# Patient Record
Sex: Male | Born: 1955 | Race: White | Hispanic: No | Marital: Married | Smoking: Current some day smoker
Health system: Southern US, Community
[De-identification: ages and names within clinical notes are randomized; demographics above are authoritative.]

## PROBLEM LIST (undated history)

## (undated) DIAGNOSIS — E039 Hypothyroidism, unspecified: Secondary | ICD-10-CM

## (undated) DIAGNOSIS — N183 Chronic kidney disease, stage 3 unspecified: Secondary | ICD-10-CM

## (undated) DIAGNOSIS — I10 Essential (primary) hypertension: Secondary | ICD-10-CM

## (undated) DIAGNOSIS — J45909 Unspecified asthma, uncomplicated: Secondary | ICD-10-CM

## (undated) DIAGNOSIS — H269 Unspecified cataract: Secondary | ICD-10-CM

## (undated) DIAGNOSIS — E079 Disorder of thyroid, unspecified: Secondary | ICD-10-CM

## (undated) DIAGNOSIS — I509 Heart failure, unspecified: Secondary | ICD-10-CM

## (undated) DIAGNOSIS — M109 Gout, unspecified: Secondary | ICD-10-CM

## (undated) DIAGNOSIS — J449 Chronic obstructive pulmonary disease, unspecified: Secondary | ICD-10-CM

## (undated) DIAGNOSIS — G4730 Sleep apnea, unspecified: Secondary | ICD-10-CM

## (undated) DIAGNOSIS — R0902 Hypoxemia: Secondary | ICD-10-CM

## (undated) DIAGNOSIS — E119 Type 2 diabetes mellitus without complications: Secondary | ICD-10-CM

## (undated) HISTORY — DX: Heart failure, unspecified: I50.9

## (undated) HISTORY — DX: Type 2 diabetes mellitus without complications: E11.9

## (undated) HISTORY — DX: Hypoxemia: R09.02

## (undated) HISTORY — DX: Unspecified cataract: H26.9

## (undated) HISTORY — PX: EYE SURGERY: SHX253

## (undated) HISTORY — DX: Disorder of thyroid, unspecified: E07.9

## (undated) HISTORY — PX: CHOLECYSTECTOMY: SHX55

## (undated) HISTORY — PX: VENA CAVA FILTER PLACEMENT: SUR1032

## (undated) HISTORY — DX: Sleep apnea, unspecified: G47.30

## (undated) HISTORY — DX: Chronic obstructive pulmonary disease, unspecified: J44.9

---

## 2007-12-16 ENCOUNTER — Other Ambulatory Visit: Payer: Self-pay

## 2007-12-16 ENCOUNTER — Emergency Department: Payer: Self-pay | Admitting: Emergency Medicine

## 2008-02-12 ENCOUNTER — Inpatient Hospital Stay: Payer: Self-pay | Admitting: Internal Medicine

## 2009-07-10 ENCOUNTER — Emergency Department: Payer: Self-pay | Admitting: Internal Medicine

## 2010-12-21 ENCOUNTER — Emergency Department: Payer: Self-pay | Admitting: Emergency Medicine

## 2011-06-17 ENCOUNTER — Observation Stay: Payer: Self-pay | Admitting: Internal Medicine

## 2011-07-12 ENCOUNTER — Inpatient Hospital Stay: Payer: Self-pay | Admitting: Internal Medicine

## 2012-01-11 ENCOUNTER — Observation Stay: Payer: Self-pay | Admitting: Internal Medicine

## 2012-01-11 LAB — COMPREHENSIVE METABOLIC PANEL
Albumin: 3.3 g/dL — ABNORMAL LOW (ref 3.4–5.0)
Alkaline Phosphatase: 179 U/L — ABNORMAL HIGH (ref 50–136)
BUN: 19 mg/dL — ABNORMAL HIGH (ref 7–18)
Bilirubin,Total: 0.8 mg/dL (ref 0.2–1.0)
Calcium, Total: 8.6 mg/dL (ref 8.5–10.1)
Chloride: 98 mmol/L (ref 98–107)
Co2: 27 mmol/L (ref 21–32)
Creatinine: 1.41 mg/dL — ABNORMAL HIGH (ref 0.60–1.30)
EGFR (Non-African Amer.): 55 — ABNORMAL LOW
Glucose: 187 mg/dL — ABNORMAL HIGH (ref 65–99)
Potassium: 3.8 mmol/L (ref 3.5–5.1)
SGOT(AST): 22 U/L (ref 15–37)
SGPT (ALT): 15 U/L

## 2012-01-11 LAB — URINALYSIS, COMPLETE
Bacteria: NONE SEEN
Bilirubin,UR: NEGATIVE
Glucose,UR: NEGATIVE mg/dL (ref 0–75)
Ketone: NEGATIVE
Ph: 7 (ref 4.5–8.0)
Protein: NEGATIVE
Squamous Epithelial: <1

## 2012-01-11 LAB — CBC
HCT: 39.3 % — ABNORMAL LOW (ref 40.0–52.0)
MCH: 28.8 pg (ref 26.0–34.0)
MCHC: 32.1 g/dL (ref 32.0–36.0)
MCV: 90 fL (ref 80–100)
Platelet: 239 10*3/uL (ref 150–440)
RDW: 15.1 % — ABNORMAL HIGH (ref 11.5–14.5)
WBC: 9.5 10*3/uL (ref 3.8–10.6)

## 2012-01-11 LAB — CK TOTAL AND CKMB (NOT AT ARMC)
CK, Total: 67 U/L (ref 35–232)
CK, Total: 74 U/L (ref 35–232)
CK-MB: 2.7 ng/mL (ref 0.5–3.6)
CK-MB: 2.4 ng/mL (ref 0.5–3.6)

## 2012-01-11 LAB — TROPONIN I: Troponin-I: <0.02 ng/mL

## 2012-01-11 LAB — PROTIME-INR
INR: 1.4
Prothrombin Time: 17.1 secs — ABNORMAL HIGH (ref 11.5–14.7)

## 2012-01-12 LAB — BASIC METABOLIC PANEL
Anion Gap: 8 (ref 7–16)
Chloride: 98 mmol/L (ref 98–107)
Co2: 29 mmol/L (ref 21–32)
Creatinine: 1.41 mg/dL — ABNORMAL HIGH (ref 0.60–1.30)
EGFR (African American): >60
EGFR (Non-African Amer.): 55 — ABNORMAL LOW
Glucose: 136 mg/dL — ABNORMAL HIGH (ref 65–99)
Osmolality: 274 (ref 275–301)
Potassium: 3.7 mmol/L (ref 3.5–5.1)

## 2012-01-12 LAB — PROTIME-INR: INR: 1.6

## 2012-01-28 ENCOUNTER — Emergency Department: Payer: Self-pay | Admitting: Emergency Medicine

## 2012-01-28 LAB — COMPREHENSIVE METABOLIC PANEL
Anion Gap: 10 (ref 7–16)
BUN: 34 mg/dL — ABNORMAL HIGH (ref 7–18)
Bilirubin,Total: 1.3 mg/dL — ABNORMAL HIGH (ref 0.2–1.0)
Calcium, Total: 8.3 mg/dL — ABNORMAL LOW (ref 8.5–10.1)
Chloride: 93 mmol/L — ABNORMAL LOW (ref 98–107)
Co2: 29 mmol/L (ref 21–32)
Creatinine: 1.74 mg/dL — ABNORMAL HIGH (ref 0.60–1.30)
EGFR (African American): 50 — ABNORMAL LOW
Osmolality: 280 (ref 275–301)
Potassium: 4.2 mmol/L (ref 3.5–5.1)
Sodium: 132 mmol/L — ABNORMAL LOW (ref 136–145)
Total Protein: 7 g/dL (ref 6.4–8.2)

## 2012-01-28 LAB — PROTIME-INR: Prothrombin Time: 27.3 secs — ABNORMAL HIGH (ref 11.5–14.7)

## 2012-01-28 LAB — CBC
HCT: 34.9 % — ABNORMAL LOW (ref 40.0–52.0)
HGB: 11.6 g/dL — ABNORMAL LOW (ref 13.0–18.0)
Platelet: 218 10*3/uL (ref 150–440)
RBC: 3.99 10*6/uL — ABNORMAL LOW (ref 4.40–5.90)
RDW: 15.6 % — ABNORMAL HIGH (ref 11.5–14.5)
WBC: 12.3 10*3/uL — ABNORMAL HIGH (ref 3.8–10.6)

## 2012-01-28 LAB — TROPONIN I: Troponin-I: <0.02 ng/mL

## 2012-01-28 LAB — PRO B NATRIURETIC PEPTIDE: B-Type Natriuretic Peptide: 1720 pg/mL — ABNORMAL HIGH (ref 0–125)

## 2012-03-03 ENCOUNTER — Emergency Department: Payer: Self-pay | Admitting: Emergency Medicine

## 2012-03-03 LAB — COMPREHENSIVE METABOLIC PANEL
Albumin: 3.3 g/dL — ABNORMAL LOW (ref 3.4–5.0)
Alkaline Phosphatase: 206 U/L — ABNORMAL HIGH (ref 50–136)
BUN: 26 mg/dL — ABNORMAL HIGH (ref 7–18)
Bilirubin,Total: 0.8 mg/dL (ref 0.2–1.0)
Calcium, Total: 9.5 mg/dL (ref 8.5–10.1)
Chloride: 98 mmol/L (ref 98–107)
Co2: 29 mmol/L (ref 21–32)
EGFR (African American): >60
EGFR (Non-African Amer.): 58 — ABNORMAL LOW
Glucose: 174 mg/dL — ABNORMAL HIGH (ref 65–99)
Osmolality: 275 (ref 275–301)
SGOT(AST): 25 U/L (ref 15–37)
Sodium: 133 mmol/L — ABNORMAL LOW (ref 136–145)

## 2012-03-03 LAB — TROPONIN I: Troponin-I: 0.03 ng/mL

## 2012-03-03 LAB — CBC
HCT: 36.7 % — ABNORMAL LOW (ref 40.0–52.0)
HGB: 11.9 g/dL — ABNORMAL LOW (ref 13.0–18.0)
MCH: 28.1 pg (ref 26.0–34.0)
MCV: 87 fL (ref 80–100)
RBC: 4.23 10*6/uL — ABNORMAL LOW (ref 4.40–5.90)
RDW: 16.7 % — ABNORMAL HIGH (ref 11.5–14.5)
WBC: 8.6 10*3/uL (ref 3.8–10.6)

## 2012-03-03 LAB — CK TOTAL AND CKMB (NOT AT ARMC)
CK, Total: 52 U/L (ref 35–232)
CK-MB: 2 ng/mL (ref 0.5–3.6)

## 2012-03-08 ENCOUNTER — Emergency Department: Payer: Self-pay | Admitting: Emergency Medicine

## 2012-03-08 LAB — URINALYSIS, COMPLETE
Blood: NEGATIVE
Glucose,UR: NEGATIVE mg/dL (ref 0–75)
Hyaline Cast: 15
Ketone: NEGATIVE
Leukocyte Esterase: NEGATIVE
Nitrite: NEGATIVE
Ph: 6 (ref 4.5–8.0)
Protein: 100
RBC,UR: 1 /HPF (ref 0–5)
Specific Gravity: 1.012 (ref 1.003–1.030)
Squamous Epithelial: NONE SEEN
WBC UR: <1 /HPF (ref 0–5)

## 2012-03-08 LAB — BASIC METABOLIC PANEL
BUN: 28 mg/dL — ABNORMAL HIGH (ref 7–18)
Calcium, Total: 7.7 mg/dL — ABNORMAL LOW (ref 8.5–10.1)
Creatinine: 1.5 mg/dL — ABNORMAL HIGH (ref 0.60–1.30)
Glucose: 236 mg/dL — ABNORMAL HIGH (ref 65–99)
Sodium: 133 mmol/L — ABNORMAL LOW (ref 136–145)

## 2012-03-08 LAB — CBC
HCT: 37.1 % — ABNORMAL LOW (ref 40.0–52.0)
HGB: 12 g/dL — ABNORMAL LOW (ref 13.0–18.0)
MCHC: 32.3 g/dL (ref 32.0–36.0)
MCV: 87 fL (ref 80–100)
Platelet: 203 10*3/uL (ref 150–440)
RDW: 17.1 % — ABNORMAL HIGH (ref 11.5–14.5)
WBC: 7.7 10*3/uL (ref 3.8–10.6)

## 2012-03-08 LAB — CK TOTAL AND CKMB (NOT AT ARMC)
CK, Total: 54 U/L (ref 35–232)
CK-MB: 1.4 ng/mL (ref 0.5–3.6)

## 2012-03-08 LAB — PROTIME-INR: Prothrombin Time: 19.9 secs — ABNORMAL HIGH (ref 11.5–14.7)

## 2012-05-03 ENCOUNTER — Encounter: Payer: Self-pay | Admitting: Geriatric Medicine

## 2012-06-11 ENCOUNTER — Observation Stay: Payer: Self-pay | Admitting: Internal Medicine

## 2012-06-11 LAB — CBC
HCT: 40.6 % (ref 40.0–52.0)
HGB: 13.4 g/dL (ref 13.0–18.0)
MCV: 86 fL (ref 80–100)
Platelet: 224 10*3/uL (ref 150–440)
RBC: 4.7 10*6/uL (ref 4.40–5.90)
RDW: 16.1 % — ABNORMAL HIGH (ref 11.5–14.5)
WBC: 7.8 10*3/uL (ref 3.8–10.6)

## 2012-06-11 LAB — URINALYSIS, COMPLETE
Bacteria: NONE SEEN
Bilirubin,UR: NEGATIVE
Glucose,UR: 150 mg/dL (ref 0–75)
Nitrite: NEGATIVE
Protein: NEGATIVE
RBC,UR: 442 /HPF (ref 0–5)
Specific Gravity: 1.006 (ref 1.003–1.030)
Squamous Epithelial: <1

## 2012-06-11 LAB — COMPREHENSIVE METABOLIC PANEL
Albumin: 3.4 g/dL (ref 3.4–5.0)
Alkaline Phosphatase: 231 U/L — ABNORMAL HIGH (ref 50–136)
Bilirubin,Total: 1 mg/dL (ref 0.2–1.0)
Chloride: 99 mmol/L (ref 98–107)
Creatinine: 1.63 mg/dL — ABNORMAL HIGH (ref 0.60–1.30)
EGFR (African American): 54 — ABNORMAL LOW
Glucose: 303 mg/dL — ABNORMAL HIGH (ref 65–99)
SGOT(AST): 20 U/L (ref 15–37)
SGPT (ALT): 17 U/L (ref 12–78)
Sodium: 136 mmol/L (ref 136–145)
Total Protein: 7.7 g/dL (ref 6.4–8.2)

## 2012-06-11 LAB — PRO B NATRIURETIC PEPTIDE: B-Type Natriuretic Peptide: 746 pg/mL — ABNORMAL HIGH (ref 0–125)

## 2012-06-12 LAB — CBC WITH DIFFERENTIAL/PLATELET
Eosinophil %: 2 %
HCT: 39.2 % — ABNORMAL LOW (ref 40.0–52.0)
HGB: 12.9 g/dL — ABNORMAL LOW (ref 13.0–18.0)
Lymphocyte #: 1.8 10*3/uL (ref 1.0–3.6)
Lymphocyte %: 21.4 %
MCH: 28.5 pg (ref 26.0–34.0)
MCV: 86 fL (ref 80–100)
Monocyte %: 7.3 %
Neutrophil #: 5.7 10*3/uL (ref 1.4–6.5)
Platelet: 219 10*3/uL (ref 150–440)
RDW: 16.4 % — ABNORMAL HIGH (ref 11.5–14.5)

## 2012-06-12 LAB — HEMOGLOBIN A1C: Hemoglobin A1C: 8.8 % — ABNORMAL HIGH (ref 4.2–6.3)

## 2012-06-12 LAB — PROTIME-INR: INR: 1.8

## 2012-06-13 LAB — CBC WITH DIFFERENTIAL/PLATELET
Basophil #: 0.1 10*3/uL (ref 0.0–0.1)
Basophil %: 1 %
Eosinophil %: 1.9 %
HCT: 40.8 % (ref 40.0–52.0)
HGB: 14 g/dL (ref 13.0–18.0)
Lymphocyte #: 1.8 10*3/uL (ref 1.0–3.6)
MCH: 29.4 pg (ref 26.0–34.0)
MCV: 86 fL (ref 80–100)
Monocyte #: 0.7 x10 3/mm (ref 0.2–1.0)
Monocyte %: 7 %
Neutrophil #: 6.8 10*3/uL — ABNORMAL HIGH (ref 1.4–6.5)
Platelet: 237 10*3/uL (ref 150–440)
RDW: 16.9 % — ABNORMAL HIGH (ref 11.5–14.5)
WBC: 9.6 10*3/uL (ref 3.8–10.6)

## 2012-06-13 LAB — BASIC METABOLIC PANEL
BUN: 25 mg/dL — ABNORMAL HIGH (ref 7–18)
Calcium, Total: 8.9 mg/dL (ref 8.5–10.1)
EGFR (African American): >60
EGFR (Non-African Amer.): 53 — ABNORMAL LOW
Glucose: 156 mg/dL — ABNORMAL HIGH (ref 65–99)
Osmolality: 276 (ref 275–301)
Potassium: 3.9 mmol/L (ref 3.5–5.1)
Sodium: 134 mmol/L — ABNORMAL LOW (ref 136–145)

## 2012-06-13 LAB — PROTIME-INR: INR: 1.8

## 2012-06-13 LAB — URINALYSIS, COMPLETE
Bacteria: NONE SEEN
Nitrite: NEGATIVE
Protein: 100
RBC,UR: 513 /HPF (ref 0–5)
Specific Gravity: 1.014 (ref 1.003–1.030)
Squamous Epithelial: NONE SEEN
WBC UR: 18 /HPF (ref 0–5)

## 2012-06-13 LAB — URINE CULTURE

## 2012-06-14 LAB — PROTIME-INR
INR: 2
Prothrombin Time: 22.8 secs — ABNORMAL HIGH (ref 11.5–14.7)

## 2012-06-14 LAB — CBC WITH DIFFERENTIAL/PLATELET
Basophil #: 0.1 10*3/uL (ref 0.0–0.1)
Eosinophil #: 0.2 10*3/uL (ref 0.0–0.7)
HGB: 13.8 g/dL (ref 13.0–18.0)
Lymphocyte #: 2.1 10*3/uL (ref 1.0–3.6)
Lymphocyte %: 23 %
MCHC: 33.5 g/dL (ref 32.0–36.0)
Neutrophil %: 64.9 %
RDW: 16 % — ABNORMAL HIGH (ref 11.5–14.5)

## 2012-07-04 ENCOUNTER — Ambulatory Visit: Payer: Self-pay | Admitting: Urology

## 2012-10-02 LAB — URINALYSIS, COMPLETE
Bacteria: NONE SEEN
Bilirubin,UR: NEGATIVE
Hyaline Cast: 9
Ketone: NEGATIVE
Nitrite: NEGATIVE
Protein: NEGATIVE
WBC UR: <1 /HPF (ref 0–5)

## 2012-10-02 LAB — CK TOTAL AND CKMB (NOT AT ARMC)
CK, Total: 57 U/L (ref 35–232)
CK-MB: 1.5 ng/mL (ref 0.5–3.6)

## 2012-10-02 LAB — CBC
HCT: 38.7 % — ABNORMAL LOW (ref 40.0–52.0)
HGB: 12.5 g/dL — ABNORMAL LOW (ref 13.0–18.0)
Platelet: 267 10*3/uL (ref 150–440)
RBC: 4.37 10*6/uL — ABNORMAL LOW (ref 4.40–5.90)
RDW: 15.2 % — ABNORMAL HIGH (ref 11.5–14.5)
WBC: 10.1 10*3/uL (ref 3.8–10.6)

## 2012-10-02 LAB — COMPREHENSIVE METABOLIC PANEL
Alkaline Phosphatase: 185 U/L — ABNORMAL HIGH (ref 50–136)
Calcium, Total: 8.7 mg/dL (ref 8.5–10.1)
Co2: 27 mmol/L (ref 21–32)
Creatinine: 1.98 mg/dL — ABNORMAL HIGH (ref 0.60–1.30)
EGFR (Non-African Amer.): 36 — ABNORMAL LOW
Glucose: 134 mg/dL — ABNORMAL HIGH (ref 65–99)
Osmolality: 274 (ref 275–301)
Sodium: 132 mmol/L — ABNORMAL LOW (ref 136–145)

## 2012-10-02 LAB — PROTIME-INR: INR: 1.7

## 2012-10-02 LAB — TROPONIN I: Troponin-I: <0.02 ng/mL

## 2012-10-03 ENCOUNTER — Inpatient Hospital Stay: Payer: Self-pay | Admitting: Internal Medicine

## 2012-10-03 LAB — TROPONIN I
Troponin-I: <0.02 ng/mL
Troponin-I: <0.02 ng/mL

## 2012-10-03 LAB — PROTIME-INR: INR: 1.7

## 2012-10-04 LAB — CBC WITH DIFFERENTIAL/PLATELET
Basophil #: 0.1 10*3/uL (ref 0.0–0.1)
Basophil %: 0.7 %
HGB: 13 g/dL (ref 13.0–18.0)
Lymphocyte #: 1.7 10*3/uL (ref 1.0–3.6)
MCHC: 34 g/dL (ref 32.0–36.0)
MCV: 89 fL (ref 80–100)
Monocyte #: 0.8 x10 3/mm (ref 0.2–1.0)
Neutrophil #: 6.5 10*3/uL (ref 1.4–6.5)
Platelet: 230 10*3/uL (ref 150–440)
RBC: 4.3 10*6/uL — ABNORMAL LOW (ref 4.40–5.90)
RDW: 15.1 % — ABNORMAL HIGH (ref 11.5–14.5)

## 2012-10-04 LAB — BASIC METABOLIC PANEL
BUN: 35 mg/dL — ABNORMAL HIGH (ref 7–18)
Calcium, Total: 9.1 mg/dL (ref 8.5–10.1)
Chloride: 99 mmol/L (ref 98–107)
Creatinine: 1.68 mg/dL — ABNORMAL HIGH (ref 0.60–1.30)
Potassium: 3.7 mmol/L (ref 3.5–5.1)
Sodium: 135 mmol/L — ABNORMAL LOW (ref 136–145)

## 2012-10-04 LAB — PROTIME-INR
INR: 1.8
Prothrombin Time: 20.8 secs — ABNORMAL HIGH (ref 11.5–14.7)

## 2013-02-14 ENCOUNTER — Inpatient Hospital Stay: Payer: Self-pay | Admitting: Specialist

## 2013-02-14 LAB — CBC
HCT: 40.3 % (ref 40.0–52.0)
HGB: 14 g/dL (ref 13.0–18.0)
MCH: 30.1 pg (ref 26.0–34.0)
MCHC: 34.7 g/dL (ref 32.0–36.0)
Platelet: 218 10*3/uL (ref 150–440)
RBC: 4.65 10*6/uL (ref 4.40–5.90)
RDW: 15.3 % — ABNORMAL HIGH (ref 11.5–14.5)
WBC: 8.9 10*3/uL (ref 3.8–10.6)

## 2013-02-14 LAB — COMPREHENSIVE METABOLIC PANEL
Albumin: 3.1 g/dL — ABNORMAL LOW (ref 3.4–5.0)
Alkaline Phosphatase: 180 U/L — ABNORMAL HIGH (ref 50–136)
Bilirubin,Total: 0.5 mg/dL (ref 0.2–1.0)
Calcium, Total: 8.9 mg/dL (ref 8.5–10.1)
Co2: 30 mmol/L (ref 21–32)
Creatinine: 1.9 mg/dL — ABNORMAL HIGH (ref 0.60–1.30)
EGFR (African American): 44 — ABNORMAL LOW
EGFR (Non-African Amer.): 38 — ABNORMAL LOW
Glucose: 141 mg/dL — ABNORMAL HIGH (ref 65–99)
Potassium: 3.5 mmol/L (ref 3.5–5.1)
SGPT (ALT): 14 U/L (ref 12–78)
Sodium: 134 mmol/L — ABNORMAL LOW (ref 136–145)

## 2013-02-14 LAB — PROTIME-INR
INR: 1.9
Prothrombin Time: 21.1 secs — ABNORMAL HIGH (ref 11.5–14.7)

## 2013-02-14 LAB — CK TOTAL AND CKMB (NOT AT ARMC)
CK, Total: 65 U/L (ref 35–232)
CK-MB: 1.7 ng/mL (ref 0.5–3.6)

## 2013-02-14 LAB — APTT: Activated PTT: 37.2 secs — ABNORMAL HIGH (ref 23.6–35.9)

## 2013-02-15 LAB — CBC WITH DIFFERENTIAL/PLATELET
Basophil #: 0 10*3/uL (ref 0.0–0.1)
Basophil %: 0.7 %
Eosinophil #: 0.1 10*3/uL (ref 0.0–0.7)
MCHC: 34.9 g/dL (ref 32.0–36.0)
MCV: 87 fL (ref 80–100)
Monocyte #: 0.8 x10 3/mm (ref 0.2–1.0)
Neutrophil %: 63.4 %
Platelet: 186 10*3/uL (ref 150–440)
RBC: 4.41 10*6/uL (ref 4.40–5.90)
RDW: 15.5 % — ABNORMAL HIGH (ref 11.5–14.5)

## 2013-02-15 LAB — URINALYSIS, COMPLETE
Bacteria: NONE SEEN
Bilirubin,UR: NEGATIVE
Blood: NEGATIVE
Glucose,UR: >=500 mg/dL (ref 0–75)
Leukocyte Esterase: NEGATIVE
Nitrite: NEGATIVE
Protein: NEGATIVE
Squamous Epithelial: NONE SEEN
WBC UR: <1 /HPF (ref 0–5)

## 2013-02-15 LAB — HEMOGLOBIN: HGB: 13.5 g/dL (ref 13.0–18.0)

## 2013-02-15 LAB — BASIC METABOLIC PANEL
Anion Gap: 6 — ABNORMAL LOW (ref 7–16)
Calcium, Total: 8.6 mg/dL (ref 8.5–10.1)
Chloride: 100 mmol/L (ref 98–107)
Co2: 29 mmol/L (ref 21–32)
EGFR (African American): 54 — ABNORMAL LOW
Glucose: 66 mg/dL (ref 65–99)
Osmolality: 274 (ref 275–301)

## 2013-02-15 LAB — PROTEIN / CREATININE RATIO, URINE
Creatinine, Urine: 100.9 mg/dL (ref 30.0–125.0)
Protein/Creat. Ratio: 188 mg/gCREAT (ref 0–200)

## 2013-02-16 LAB — BASIC METABOLIC PANEL
Anion Gap: 7 (ref 7–16)
BUN: 27 mg/dL — ABNORMAL HIGH (ref 7–18)
Creatinine: 1.5 mg/dL — ABNORMAL HIGH (ref 0.60–1.30)
EGFR (Non-African Amer.): 51 — ABNORMAL LOW
Glucose: 232 mg/dL — ABNORMAL HIGH (ref 65–99)
Osmolality: 275 (ref 275–301)
Potassium: 4.2 mmol/L (ref 3.5–5.1)
Sodium: 131 mmol/L — ABNORMAL LOW (ref 136–145)

## 2013-02-16 LAB — CBC WITH DIFFERENTIAL/PLATELET
Basophil #: 0 10*3/uL (ref 0.0–0.1)
Basophil %: 0.1 %
Eosinophil #: 0 10*3/uL (ref 0.0–0.7)
HCT: 36.7 % — ABNORMAL LOW (ref 40.0–52.0)
Lymphocyte #: 0.8 10*3/uL — ABNORMAL LOW (ref 1.0–3.6)
MCHC: 35.2 g/dL (ref 32.0–36.0)
Neutrophil #: 6.7 10*3/uL — ABNORMAL HIGH (ref 1.4–6.5)
Neutrophil %: 87.5 %
Platelet: 183 10*3/uL (ref 150–440)
RBC: 4.27 10*6/uL — ABNORMAL LOW (ref 4.40–5.90)

## 2013-10-02 ENCOUNTER — Emergency Department: Payer: Self-pay | Admitting: Emergency Medicine

## 2013-10-02 LAB — CBC
HCT: 45.1 % (ref 40.0–52.0)
HGB: 15.2 g/dL (ref 13.0–18.0)
MCH: 29.7 pg (ref 26.0–34.0)
MCHC: 33.8 g/dL (ref 32.0–36.0)
MCV: 88 fL (ref 80–100)
PLATELETS: 230 10*3/uL (ref 150–440)
RBC: 5.14 10*6/uL (ref 4.40–5.90)
RDW: 14.3 % (ref 11.5–14.5)
WBC: 9.6 10*3/uL (ref 3.8–10.6)

## 2013-10-02 LAB — COMPREHENSIVE METABOLIC PANEL
ALT: 11 U/L — AB (ref 12–78)
AST: 40 U/L — AB (ref 15–37)
Albumin: 3.3 g/dL — ABNORMAL LOW (ref 3.4–5.0)
Alkaline Phosphatase: 163 U/L — ABNORMAL HIGH
Anion Gap: 4 — ABNORMAL LOW (ref 7–16)
BILIRUBIN TOTAL: 1.1 mg/dL — AB (ref 0.2–1.0)
BUN: 28 mg/dL — AB (ref 7–18)
Calcium, Total: 9.1 mg/dL (ref 8.5–10.1)
Chloride: 93 mmol/L — ABNORMAL LOW (ref 98–107)
Co2: 32 mmol/L (ref 21–32)
Creatinine: 2.14 mg/dL — ABNORMAL HIGH (ref 0.60–1.30)
EGFR (African American): 38 — ABNORMAL LOW
EGFR (Non-African Amer.): 33 — ABNORMAL LOW
GLUCOSE: 181 mg/dL — AB (ref 65–99)
Osmolality: 269 (ref 275–301)
POTASSIUM: 3.8 mmol/L (ref 3.5–5.1)
Sodium: 129 mmol/L — ABNORMAL LOW (ref 136–145)
TOTAL PROTEIN: 7.8 g/dL (ref 6.4–8.2)

## 2013-10-03 LAB — URINALYSIS, COMPLETE
Bacteria: NONE SEEN
Bilirubin,UR: NEGATIVE
Blood: NEGATIVE
Glucose,UR: NEGATIVE mg/dL (ref 0–75)
Ketone: NEGATIVE
Leukocyte Esterase: NEGATIVE
Nitrite: NEGATIVE
Ph: 8 (ref 4.5–8.0)
Protein: NEGATIVE
SPECIFIC GRAVITY: 1.008 (ref 1.003–1.030)
Squamous Epithelial: NONE SEEN
WBC UR: NONE SEEN /HPF (ref 0–5)

## 2013-10-30 ENCOUNTER — Inpatient Hospital Stay: Payer: Self-pay | Admitting: Internal Medicine

## 2013-10-30 LAB — CBC WITH DIFFERENTIAL/PLATELET
BASOS ABS: 0 10*3/uL (ref 0.0–0.1)
Basophil %: 0.3 %
Eosinophil #: 0.1 10*3/uL (ref 0.0–0.7)
Eosinophil %: 0.7 %
HCT: 41.1 % (ref 40.0–52.0)
HGB: 14.1 g/dL (ref 13.0–18.0)
Lymphocyte #: 1.8 10*3/uL (ref 1.0–3.6)
Lymphocyte %: 12.7 %
MCH: 30.5 pg (ref 26.0–34.0)
MCHC: 34.3 g/dL (ref 32.0–36.0)
MCV: 89 fL (ref 80–100)
MONO ABS: 0.8 x10 3/mm (ref 0.2–1.0)
MONOS PCT: 6.1 %
NEUTROS ABS: 11.1 10*3/uL — AB (ref 1.4–6.5)
NEUTROS PCT: 80.2 %
PLATELETS: 235 10*3/uL (ref 150–440)
RBC: 4.62 10*6/uL (ref 4.40–5.90)
RDW: 14.2 % (ref 11.5–14.5)
WBC: 13.8 10*3/uL — ABNORMAL HIGH (ref 3.8–10.6)

## 2013-10-30 LAB — URINALYSIS, COMPLETE
BILIRUBIN, UR: NEGATIVE
BLOOD: NEGATIVE
Bacteria: NONE SEEN
GLUCOSE, UR: NEGATIVE mg/dL (ref 0–75)
Hyaline Cast: 31
Ketone: NEGATIVE
LEUKOCYTE ESTERASE: NEGATIVE
Nitrite: NEGATIVE
PH: 8 (ref 4.5–8.0)
PROTEIN: NEGATIVE
RBC,UR: 2 /HPF (ref 0–5)
Specific Gravity: 1.006 (ref 1.003–1.030)
Squamous Epithelial: NONE SEEN
WBC UR: NONE SEEN /HPF (ref 0–5)

## 2013-10-30 LAB — COMPREHENSIVE METABOLIC PANEL
ALBUMIN: 3 g/dL — AB (ref 3.4–5.0)
ANION GAP: 5 — AB (ref 7–16)
Alkaline Phosphatase: 159 U/L — ABNORMAL HIGH
BUN: 22 mg/dL — ABNORMAL HIGH (ref 7–18)
Bilirubin,Total: 1.5 mg/dL — ABNORMAL HIGH (ref 0.2–1.0)
CHLORIDE: 98 mmol/L (ref 98–107)
Calcium, Total: 8.7 mg/dL (ref 8.5–10.1)
Co2: 27 mmol/L (ref 21–32)
Creatinine: 1.86 mg/dL — ABNORMAL HIGH (ref 0.60–1.30)
GFR CALC AF AMER: 45 — AB
GFR CALC NON AF AMER: 39 — AB
Glucose: 120 mg/dL — ABNORMAL HIGH (ref 65–99)
OSMOLALITY: 265 (ref 275–301)
Potassium: 3.5 mmol/L (ref 3.5–5.1)
SGOT(AST): 18 U/L (ref 15–37)
SGPT (ALT): 12 U/L (ref 12–78)
Sodium: 130 mmol/L — ABNORMAL LOW (ref 136–145)
TOTAL PROTEIN: 7.6 g/dL (ref 6.4–8.2)

## 2013-10-30 LAB — PROTIME-INR
INR: 1.1
Prothrombin Time: 14.4 secs (ref 11.5–14.7)

## 2013-10-30 LAB — CK TOTAL AND CKMB (NOT AT ARMC)
CK, TOTAL: 29 U/L — AB
CK, Total: 35 U/L — ABNORMAL LOW
CK, Total: 26 U/L — ABNORMAL LOW
CK-MB: <0.5 ng/mL — ABNORMAL LOW (ref 0.5–3.6)
CK-MB: <0.5 ng/mL — ABNORMAL LOW (ref 0.5–3.6)
CK-MB: <0.5 ng/mL — ABNORMAL LOW (ref 0.5–3.6)

## 2013-10-30 LAB — TROPONIN I

## 2013-10-31 LAB — CBC WITH DIFFERENTIAL/PLATELET
BASOS ABS: 0 10*3/uL (ref 0.0–0.1)
BASOS PCT: 0.5 %
Eosinophil #: 0.1 10*3/uL (ref 0.0–0.7)
Eosinophil %: 1 %
HCT: 38.2 % — ABNORMAL LOW (ref 40.0–52.0)
HGB: 13.4 g/dL (ref 13.0–18.0)
LYMPHS PCT: 17.1 %
Lymphocyte #: 1.5 10*3/uL (ref 1.0–3.6)
MCH: 31.1 pg (ref 26.0–34.0)
MCHC: 35.1 g/dL (ref 32.0–36.0)
MCV: 89 fL (ref 80–100)
MONOS PCT: 6.5 %
Monocyte #: 0.6 x10 3/mm (ref 0.2–1.0)
NEUTROS ABS: 6.5 10*3/uL (ref 1.4–6.5)
Neutrophil %: 74.9 %
Platelet: 190 10*3/uL (ref 150–440)
RBC: 4.31 10*6/uL — ABNORMAL LOW (ref 4.40–5.90)
RDW: 14 % (ref 11.5–14.5)
WBC: 8.7 10*3/uL (ref 3.8–10.6)

## 2013-10-31 LAB — BASIC METABOLIC PANEL
Anion Gap: 4 — ABNORMAL LOW (ref 7–16)
BUN: 20 mg/dL — ABNORMAL HIGH (ref 7–18)
CREATININE: 1.53 mg/dL — AB (ref 0.60–1.30)
Calcium, Total: 8.3 mg/dL — ABNORMAL LOW (ref 8.5–10.1)
Chloride: 101 mmol/L (ref 98–107)
Co2: 28 mmol/L (ref 21–32)
EGFR (Non-African Amer.): 49 — ABNORMAL LOW
GFR CALC AF AMER: 57 — AB
Glucose: 116 mg/dL — ABNORMAL HIGH (ref 65–99)
OSMOLALITY: 270 (ref 275–301)
Potassium: 3.9 mmol/L (ref 3.5–5.1)
Sodium: 133 mmol/L — ABNORMAL LOW (ref 136–145)

## 2013-11-04 LAB — CULTURE, BLOOD (SINGLE)

## 2014-05-09 ENCOUNTER — Emergency Department: Payer: Self-pay | Admitting: Internal Medicine

## 2014-05-09 LAB — LIPASE, BLOOD: Lipase: 122 U/L (ref 73–393)

## 2014-05-09 LAB — COMPREHENSIVE METABOLIC PANEL
ALBUMIN: 3 g/dL — AB (ref 3.4–5.0)
AST: 24 U/L (ref 15–37)
Alkaline Phosphatase: 325 U/L — ABNORMAL HIGH
Anion Gap: 13 (ref 7–16)
BILIRUBIN TOTAL: 0.6 mg/dL (ref 0.2–1.0)
BUN: 31 mg/dL — AB (ref 7–18)
CHLORIDE: 95 mmol/L — AB (ref 98–107)
Calcium, Total: 8.9 mg/dL (ref 8.5–10.1)
Co2: 27 mmol/L (ref 21–32)
Creatinine: 1.72 mg/dL — ABNORMAL HIGH (ref 0.60–1.30)
EGFR (African American): 50 — ABNORMAL LOW
GFR CALC NON AF AMER: 43 — AB
Glucose: 257 mg/dL — ABNORMAL HIGH (ref 65–99)
Osmolality: 285 (ref 275–301)
Potassium: 3.3 mmol/L — ABNORMAL LOW (ref 3.5–5.1)
SGPT (ALT): 17 U/L
Sodium: 135 mmol/L — ABNORMAL LOW (ref 136–145)
Total Protein: 7.2 g/dL (ref 6.4–8.2)

## 2014-05-09 LAB — CBC
HCT: 42.9 % (ref 40.0–52.0)
HGB: 13.9 g/dL (ref 13.0–18.0)
MCH: 29.2 pg (ref 26.0–34.0)
MCHC: 32.5 g/dL (ref 32.0–36.0)
MCV: 90 fL (ref 80–100)
Platelet: 213 10*3/uL (ref 150–440)
RBC: 4.76 10*6/uL (ref 4.40–5.90)
RDW: 14.6 % — ABNORMAL HIGH (ref 11.5–14.5)
WBC: 8.9 10*3/uL (ref 3.8–10.6)

## 2014-05-09 LAB — URINALYSIS, COMPLETE
BACTERIA: NONE SEEN
BILIRUBIN, UR: NEGATIVE
Blood: NEGATIVE
Glucose,UR: NEGATIVE mg/dL (ref 0–75)
Hyaline Cast: 11
KETONE: NEGATIVE
LEUKOCYTE ESTERASE: NEGATIVE
NITRITE: NEGATIVE
Ph: 6 (ref 4.5–8.0)
Protein: 30
RBC,UR: 1 /HPF (ref 0–5)
Specific Gravity: 1.006 (ref 1.003–1.030)
WBC UR: <1 /HPF (ref 0–5)

## 2014-05-09 LAB — TROPONIN I: TROPONIN-I: 0.03 ng/mL

## 2014-05-29 LAB — CBC WITH DIFFERENTIAL/PLATELET
BASOS PCT: 1.1 %
Basophil #: 0.1 10*3/uL (ref 0.0–0.1)
EOS PCT: 2.7 %
Eosinophil #: 0.2 10*3/uL (ref 0.0–0.7)
HCT: 42.5 % (ref 40.0–52.0)
HGB: 13.4 g/dL (ref 13.0–18.0)
Lymphocyte #: 1.5 10*3/uL (ref 1.0–3.6)
Lymphocyte %: 19.8 %
MCH: 28.8 pg (ref 26.0–34.0)
MCHC: 31.6 g/dL — ABNORMAL LOW (ref 32.0–36.0)
MCV: 91 fL (ref 80–100)
MONO ABS: 0.6 x10 3/mm (ref 0.2–1.0)
Monocyte %: 7.5 %
NEUTROS ABS: 5.2 10*3/uL (ref 1.4–6.5)
NEUTROS PCT: 68.9 %
PLATELETS: 192 10*3/uL (ref 150–440)
RBC: 4.65 10*6/uL (ref 4.40–5.90)
RDW: 14.8 % — AB (ref 11.5–14.5)
WBC: 7.5 10*3/uL (ref 3.8–10.6)

## 2014-05-29 LAB — COMPREHENSIVE METABOLIC PANEL
ALT: 12 U/L — AB
AST: 22 U/L (ref 15–37)
Albumin: 3.2 g/dL — ABNORMAL LOW (ref 3.4–5.0)
Alkaline Phosphatase: 326 U/L — ABNORMAL HIGH
Anion Gap: 8 (ref 7–16)
BUN: 21 mg/dL — AB (ref 7–18)
Bilirubin,Total: 1.2 mg/dL — ABNORMAL HIGH (ref 0.2–1.0)
CO2: 28 mmol/L (ref 21–32)
CREATININE: 1.74 mg/dL — AB (ref 0.60–1.30)
Calcium, Total: 8.6 mg/dL (ref 8.5–10.1)
Chloride: 98 mmol/L (ref 98–107)
EGFR (Non-African Amer.): 42 — ABNORMAL LOW
GFR CALC AF AMER: 49 — AB
GLUCOSE: 260 mg/dL — AB (ref 65–99)
OSMOLALITY: 280 (ref 275–301)
Potassium: 3.3 mmol/L — ABNORMAL LOW (ref 3.5–5.1)
SODIUM: 134 mmol/L — AB (ref 136–145)
TOTAL PROTEIN: 7.1 g/dL (ref 6.4–8.2)

## 2014-05-29 LAB — LIPASE, BLOOD: Lipase: 121 U/L (ref 73–393)

## 2014-05-29 LAB — TROPONIN I: Troponin-I: <0.02 ng/mL

## 2014-05-30 ENCOUNTER — Inpatient Hospital Stay: Payer: Self-pay | Admitting: Internal Medicine

## 2014-05-30 LAB — URINALYSIS, COMPLETE
BILIRUBIN, UR: NEGATIVE
Bacteria: NONE SEEN
Blood: NEGATIVE
GLUCOSE, UR: NEGATIVE mg/dL (ref 0–75)
Ketone: NEGATIVE
Leukocyte Esterase: NEGATIVE
Nitrite: NEGATIVE
Ph: 7 (ref 4.5–8.0)
Protein: 30
SPECIFIC GRAVITY: 1.01 (ref 1.003–1.030)
Squamous Epithelial: <1
WBC UR: NONE SEEN /HPF (ref 0–5)

## 2014-05-30 LAB — HEPARIN LEVEL (UNFRACTIONATED): Anti-Xa(Unfractionated): 0.65 IU/mL (ref 0.30–0.70)

## 2014-05-30 LAB — D-DIMER(ARMC): D-DIMER: 1034 ng/mL

## 2014-05-30 LAB — PROTIME-INR
INR: 1.1
Prothrombin Time: 14.5 secs (ref 11.5–14.7)

## 2014-05-30 LAB — APTT: Activated PTT: 32 secs (ref 23.6–35.9)

## 2014-05-31 LAB — BASIC METABOLIC PANEL
Anion Gap: 8 (ref 7–16)
BUN: 25 mg/dL — ABNORMAL HIGH (ref 7–18)
CHLORIDE: 99 mmol/L (ref 98–107)
Calcium, Total: 8.9 mg/dL (ref 8.5–10.1)
Co2: 27 mmol/L (ref 21–32)
Creatinine: 1.56 mg/dL — ABNORMAL HIGH (ref 0.60–1.30)
EGFR (African American): 56 — ABNORMAL LOW
EGFR (Non-African Amer.): 48 — ABNORMAL LOW
GLUCOSE: 175 mg/dL — AB (ref 65–99)
OSMOLALITY: 277 (ref 275–301)
Potassium: 3.6 mmol/L (ref 3.5–5.1)
Sodium: 134 mmol/L — ABNORMAL LOW (ref 136–145)

## 2014-05-31 LAB — CBC WITH DIFFERENTIAL/PLATELET
Basophil #: 0.1 10*3/uL (ref 0.0–0.1)
Basophil %: 1.1 %
Eosinophil #: 0.2 10*3/uL (ref 0.0–0.7)
Eosinophil %: 2.6 %
HCT: 41.9 % (ref 40.0–52.0)
HGB: 13.5 g/dL (ref 13.0–18.0)
Lymphocyte #: 1.7 10*3/uL (ref 1.0–3.6)
Lymphocyte %: 25.7 %
MCH: 29.1 pg (ref 26.0–34.0)
MCHC: 32.2 g/dL (ref 32.0–36.0)
MCV: 90 fL (ref 80–100)
Monocyte #: 0.5 x10 3/mm (ref 0.2–1.0)
Monocyte %: 7.9 %
Neutrophil #: 4.1 10*3/uL (ref 1.4–6.5)
Neutrophil %: 62.7 %
Platelet: 197 10*3/uL (ref 150–440)
RBC: 4.64 10*6/uL (ref 4.40–5.90)
RDW: 15.3 % — ABNORMAL HIGH (ref 11.5–14.5)
WBC: 6.5 10*3/uL (ref 3.8–10.6)

## 2014-05-31 LAB — HEPARIN LEVEL (UNFRACTIONATED): Anti-Xa(Unfractionated): 0.34 IU/mL (ref 0.30–0.70)

## 2014-06-11 ENCOUNTER — Emergency Department: Payer: Self-pay | Admitting: Emergency Medicine

## 2014-06-11 LAB — URINALYSIS, COMPLETE
Bacteria: NONE SEEN
Bilirubin,UR: NEGATIVE
Blood: NEGATIVE
Glucose,UR: NEGATIVE mg/dL (ref 0–75)
Ketone: NEGATIVE
Leukocyte Esterase: NEGATIVE
Nitrite: NEGATIVE
Ph: 6 (ref 4.5–8.0)
Protein: 25
RBC,UR: 1 /HPF (ref 0–5)
Specific Gravity: 1.005 (ref 1.003–1.030)
Squamous Epithelial: <1
WBC UR: <1 /HPF (ref 0–5)

## 2014-06-11 LAB — BASIC METABOLIC PANEL
Anion Gap: 7 (ref 7–16)
BUN: 30 mg/dL — ABNORMAL HIGH (ref 7–18)
CALCIUM: 8.4 mg/dL — AB (ref 8.5–10.1)
Chloride: 96 mmol/L — ABNORMAL LOW (ref 98–107)
Co2: 29 mmol/L (ref 21–32)
Creatinine: 1.43 mg/dL — ABNORMAL HIGH (ref 0.60–1.30)
EGFR (Non-African Amer.): 54 — ABNORMAL LOW
GLUCOSE: 116 mg/dL — AB (ref 65–99)
Osmolality: 272 (ref 275–301)
Potassium: 3.6 mmol/L (ref 3.5–5.1)
Sodium: 132 mmol/L — ABNORMAL LOW (ref 136–145)

## 2014-06-11 LAB — CBC WITH DIFFERENTIAL/PLATELET
BASOS ABS: 0.1 10*3/uL (ref 0.0–0.1)
Basophil %: 1.1 %
EOS ABS: 0.2 10*3/uL (ref 0.0–0.7)
Eosinophil %: 3.5 %
HCT: 40.9 % (ref 40.0–52.0)
HGB: 13.1 g/dL (ref 13.0–18.0)
LYMPHS PCT: 18 %
Lymphocyte #: 1.2 10*3/uL (ref 1.0–3.6)
MCH: 29 pg (ref 26.0–34.0)
MCHC: 31.9 g/dL — ABNORMAL LOW (ref 32.0–36.0)
MCV: 91 fL (ref 80–100)
MONO ABS: 0.6 x10 3/mm (ref 0.2–1.0)
MONOS PCT: 8.3 %
NEUTROS ABS: 4.8 10*3/uL (ref 1.4–6.5)
Neutrophil %: 69.1 %
Platelet: 214 10*3/uL (ref 150–440)
RBC: 4.5 10*6/uL (ref 4.40–5.90)
RDW: 14.8 % — ABNORMAL HIGH (ref 11.5–14.5)
WBC: 6.9 10*3/uL (ref 3.8–10.6)

## 2014-06-11 LAB — TROPONIN I

## 2014-06-20 ENCOUNTER — Inpatient Hospital Stay: Payer: Self-pay | Admitting: Internal Medicine

## 2014-06-20 LAB — CBC WITH DIFFERENTIAL/PLATELET
BASOS PCT: 0.9 %
Basophil #: 0.1 10*3/uL (ref 0.0–0.1)
Eosinophil #: 0.2 10*3/uL (ref 0.0–0.7)
Eosinophil %: 2.3 %
HCT: 43 % (ref 40.0–52.0)
HGB: 14.1 g/dL (ref 13.0–18.0)
Lymphocyte #: 1.3 10*3/uL (ref 1.0–3.6)
Lymphocyte %: 16.2 %
MCH: 29.4 pg (ref 26.0–34.0)
MCHC: 32.8 g/dL (ref 32.0–36.0)
MCV: 90 fL (ref 80–100)
Monocyte #: 0.6 x10 3/mm (ref 0.2–1.0)
Monocyte %: 7 %
Neutrophil #: 5.8 10*3/uL (ref 1.4–6.5)
Neutrophil %: 73.6 %
PLATELETS: 204 10*3/uL (ref 150–440)
RBC: 4.8 10*6/uL (ref 4.40–5.90)
RDW: 14.9 % — AB (ref 11.5–14.5)
WBC: 7.8 10*3/uL (ref 3.8–10.6)

## 2014-06-20 LAB — BASIC METABOLIC PANEL
ANION GAP: 8 (ref 7–16)
BUN: 17 mg/dL (ref 7–18)
CREATININE: 1.6 mg/dL — AB (ref 0.60–1.30)
Calcium, Total: 8.3 mg/dL — ABNORMAL LOW (ref 8.5–10.1)
Chloride: 94 mmol/L — ABNORMAL LOW (ref 98–107)
Co2: 28 mmol/L (ref 21–32)
EGFR (African American): 57 — ABNORMAL LOW
EGFR (Non-African Amer.): 47 — ABNORMAL LOW
Glucose: 250 mg/dL — ABNORMAL HIGH (ref 65–99)
OSMOLALITY: 271 (ref 275–301)
Potassium: 3.3 mmol/L — ABNORMAL LOW (ref 3.5–5.1)
Sodium: 130 mmol/L — ABNORMAL LOW (ref 136–145)

## 2014-06-20 LAB — APTT: Activated PTT: 43.9 secs — ABNORMAL HIGH (ref 23.6–35.9)

## 2014-06-20 LAB — PRO B NATRIURETIC PEPTIDE: B-TYPE NATIURETIC PEPTID: 929 pg/mL — AB (ref 0–125)

## 2014-06-20 LAB — TROPONIN I: Troponin-I: <0.02 ng/mL

## 2014-06-20 LAB — PROTIME-INR
INR: 1.9
Prothrombin Time: 21.6 secs — ABNORMAL HIGH (ref 11.5–14.7)

## 2014-06-25 LAB — CULTURE, BLOOD (SINGLE)

## 2014-08-07 ENCOUNTER — Emergency Department: Payer: Self-pay | Admitting: Emergency Medicine

## 2014-08-10 ENCOUNTER — Ambulatory Visit: Payer: Self-pay | Admitting: Ophthalmology

## 2014-08-10 LAB — POTASSIUM: Potassium: 3 mmol/L — ABNORMAL LOW (ref 3.5–5.1)

## 2014-08-10 LAB — PROTIME-INR
INR: 1.4
PROTHROMBIN TIME: 16.6 s — AB (ref 11.5–14.7)

## 2014-08-21 ENCOUNTER — Emergency Department: Payer: Self-pay | Admitting: Emergency Medicine

## 2014-08-21 LAB — COMPREHENSIVE METABOLIC PANEL
ALBUMIN: 2.8 g/dL — AB (ref 3.4–5.0)
ALT: 13 U/L — AB
Alkaline Phosphatase: 330 U/L — ABNORMAL HIGH
Anion Gap: 8 (ref 7–16)
BUN: 25 mg/dL — ABNORMAL HIGH (ref 7–18)
Bilirubin,Total: 1.2 mg/dL — ABNORMAL HIGH (ref 0.2–1.0)
CHLORIDE: 98 mmol/L (ref 98–107)
Calcium, Total: 8.5 mg/dL (ref 8.5–10.1)
Co2: 30 mmol/L (ref 21–32)
Creatinine: 1.73 mg/dL — ABNORMAL HIGH (ref 0.60–1.30)
EGFR (African American): 53 — ABNORMAL LOW
EGFR (Non-African Amer.): 43 — ABNORMAL LOW
GLUCOSE: 215 mg/dL — AB (ref 65–99)
Osmolality: 283 (ref 275–301)
Potassium: 3.7 mmol/L (ref 3.5–5.1)
SGOT(AST): 25 U/L (ref 15–37)
Sodium: 136 mmol/L (ref 136–145)
Total Protein: 7.2 g/dL (ref 6.4–8.2)

## 2014-08-21 LAB — CBC WITH DIFFERENTIAL/PLATELET
Basophil #: 0.1 10*3/uL (ref 0.0–0.1)
Basophil %: 1.5 %
Eosinophil #: 0.1 10*3/uL (ref 0.0–0.7)
Eosinophil %: 2 %
HCT: 40.2 % (ref 40.0–52.0)
HGB: 13.1 g/dL (ref 13.0–18.0)
Lymphocyte #: 1.3 10*3/uL (ref 1.0–3.6)
Lymphocyte %: 20 %
MCH: 28.7 pg (ref 26.0–34.0)
MCHC: 32.5 g/dL (ref 32.0–36.0)
MCV: 88 fL (ref 80–100)
Monocyte #: 0.5 x10 3/mm (ref 0.2–1.0)
Monocyte %: 8.3 %
Neutrophil #: 4.5 10*3/uL (ref 1.4–6.5)
Neutrophil %: 68.2 %
Platelet: 198 10*3/uL (ref 150–440)
RBC: 4.55 10*6/uL (ref 4.40–5.90)
RDW: 15.9 % — ABNORMAL HIGH (ref 11.5–14.5)
WBC: 6.6 10*3/uL (ref 3.8–10.6)

## 2014-08-21 LAB — MAGNESIUM: Magnesium: 1.9 mg/dL

## 2014-08-21 LAB — PROTIME-INR
INR: 3.1
Prothrombin Time: 31.1 secs — ABNORMAL HIGH (ref 11.5–14.7)

## 2014-08-21 LAB — ED INFLUENZA
H1N1 flu by pcr: NOT DETECTED
INFLAPCR: NEGATIVE
Influenza B By PCR: NEGATIVE

## 2014-08-21 LAB — TROPONIN I: Troponin-I: <0.02 ng/mL

## 2014-08-21 LAB — LIPASE, BLOOD: Lipase: 70 U/L — ABNORMAL LOW (ref 73–393)

## 2014-10-16 ENCOUNTER — Ambulatory Visit: Payer: Self-pay | Admitting: Gastroenterology

## 2014-10-29 ENCOUNTER — Emergency Department: Payer: Self-pay | Admitting: Emergency Medicine

## 2014-10-29 LAB — PROTIME-INR
INR: 3
PROTHROMBIN TIME: 31.4 s — AB

## 2014-10-29 LAB — CBC WITH DIFFERENTIAL/PLATELET
BASOS ABS: 0.1 10*3/uL (ref 0.0–0.1)
Basophil %: 1.1 %
EOS PCT: 2.2 %
Eosinophil #: 0.1 10*3/uL (ref 0.0–0.7)
HCT: 40 % (ref 40.0–52.0)
HGB: 12.9 g/dL — AB (ref 13.0–18.0)
Lymphocyte #: 1.1 10*3/uL (ref 1.0–3.6)
Lymphocyte %: 19.5 %
MCH: 27.6 pg (ref 26.0–34.0)
MCHC: 32.2 g/dL (ref 32.0–36.0)
MCV: 86 fL (ref 80–100)
MONOS PCT: 8.5 %
Monocyte #: 0.5 x10 3/mm (ref 0.2–1.0)
NEUTROS ABS: 4 10*3/uL (ref 1.4–6.5)
Neutrophil %: 68.7 %
Platelet: 221 10*3/uL (ref 150–440)
RBC: 4.66 10*6/uL (ref 4.40–5.90)
RDW: 17 % — ABNORMAL HIGH (ref 11.5–14.5)
WBC: 5.8 10*3/uL (ref 3.8–10.6)

## 2014-10-29 LAB — APTT: Activated PTT: 47.8 secs — ABNORMAL HIGH (ref 23.6–35.9)

## 2014-10-30 ENCOUNTER — Inpatient Hospital Stay: Payer: Self-pay | Admitting: Internal Medicine

## 2014-11-09 ENCOUNTER — Ambulatory Visit: Payer: Self-pay | Admitting: Gastroenterology

## 2014-12-05 ENCOUNTER — Ambulatory Visit: Payer: Self-pay | Admitting: Ophthalmology

## 2014-12-11 ENCOUNTER — Ambulatory Visit: Payer: Self-pay | Admitting: Ophthalmology

## 2014-12-17 ENCOUNTER — Other Ambulatory Visit: Payer: Self-pay | Admitting: *Deleted

## 2014-12-17 NOTE — Patient Outreach (Signed)
Follow up phone call:  Spoke with pt, HIPPA verified to which pt  preferred speak to spouse Jasmine DecemberSharon (on [facility] consent form).  This RN CM spoke with spouse 2 weeks ago as pt refused initial home visit on 3/14, was receiving home health services, scheduled with spouse to do f/u call today.    Spouse states [employer] closed the case (home health nurse no longer coming).   Spouse states pt had cataract surgery last week to right eye, to f/u with Eye MD on 4/12 about left eye.   [facility] ([facility] CM)again  to which pt agreed since there no cost involved.   Initial home visit scheduled for 4/1.

## 2014-12-21 ENCOUNTER — Other Ambulatory Visit: Payer: Self-pay | Admitting: *Deleted

## 2014-12-21 ENCOUNTER — Encounter: Payer: Self-pay | Admitting: *Deleted

## 2014-12-21 NOTE — Patient Instructions (Signed)
Pt to obtain a scale, weigh daily/[facility]. Elevate legs more to help bring swelling down

## 2014-12-21 NOTE — Patient Outreach (Signed)
[facility] Care Management   12/21/2014  Tyler AusMichael A Soza 1956-05-08 [id number]  Tyler AusMichael A [name] is an 59 y.o. male  Subjective: Pt states his biggest problem is sinuses, has it  year round,can't be on a antihistamine because of BP medicine. Pt states lemon helps,currently out.  Pt states he has not weighed since scale was taken away a week ago.  Pt states he had cataract on right eye done 3/21, see real good now.  Pt states he is to have left eye done 4/12.  Pt states he is to f/u with Dr. Harrington [facility]hies this month.  Pt states his sugars are doing good, steady, not dropping at night when sleeping.    Pt states his appetite is down, don't know what to eat (low sodium/low sugar), can't eat red meat, [place]key, fish because of his gout.     Objective:   Review of Systems  Cardiovascular: Positive for leg swelling.    Physical Exam : lungs clear.  Vital signs-  114/68, pulse- 58, respirations 20, O2 sat- 97%.  Current Medications:  Vitamin B 12 1000 mcg sublingual daily Gabapentin (Neurontin) 100 mg tablet daily  NPH Human (Humulin N,Novolin N) 100 unit/ml injection-  90 units  30 minutes prior to breakfast, 90 units prior to supper. Insulin regular (Novolin R,Humulin R)100 units/ml injection-10 units 30 minutes prior to meal if BG>120 and eating Levothyroxine (Synthroid,Levothroid) 100 mcg tablet daily Metolazone (Zaroxolyn) 5 mg tablet daily Paroxetine (Paxil) 20 mg tablet daily Potassium Chloride (K-Dur, Klor-Con) 10 meq tablet- take 2 tablets daily Pravastatin (Pravachol) 40 mg tablet daily Spironolactone (Aldactone) 25 mg tablet daily Torsemide (Demadex) 20 mg tablet daily Warfarin (Coumadin) 10 mg tablet,  Per pt - 1/2 tablet Monday,Wednesday, Friday.  Whole tablet Tuesday, Thursday,Saturday  Prilosec (Omeprazole) OTC 20 mg tablet daily  Colchicine 0.6mg  tablet- one tablet by mouth twice daily as needed for wrist pain       Assessment:  Pt main historian.                        Hx of HF- need to get scale, start weighing                         Sinuses-  Reports ongoing drainage  Plan:  Pt to obtain a scale, start weighing/recording [facility] calendar provided)            Pt to elevate feet/legs more to help bring swelling down.            Spouse to read food labels for sodium content.             Plan to provide pt with RN CM services, f/u with monthly home visits   Shayne Alkenose M.   Pierzchala RN CCM [facility]  [phone]

## 2015-01-01 ENCOUNTER — Ambulatory Visit
Admit: 2015-01-01 | Disposition: A | Discharge status: Home / Self Care | Payer: Self-pay | Attending: Ophthalmology | Admitting: Ophthalmology

## 2015-01-07 ENCOUNTER — Encounter: Payer: Self-pay | Admitting: Internal Medicine

## 2015-01-08 NOTE — Consult Note (Signed)
Brief Consult Note: Diagnosis: Hematuria.   Patient was seen by consultant.   Consult note dictated.   Comments: mild hematuria.  Recc: 1.noncontrast CT abd/pelvis due to chronic kidney disease. 2.Cystoscopy eval can be done as an outpt.  Electronic Signatures: Riki AltesStoioff, Scott C (MD)  (Signed 24-Sep-13 07:51)  Authored: Brief Consult Note   Last Updated: 24-Sep-13 07:51 by Riki AltesStoioff, Scott C (MD)

## 2015-01-08 NOTE — Discharge Summary (Signed)
PATIENT NAME:  Tyler [name], Tyler [name] MR#:  [id number] DATE OF BIRTH:  May 14, 1956  DATE OF ADMISSION:  06/11/2012 DATE OF DISCHARGE:  06/14/2012  PRIMARY CARE PHYSICIAN: Dr. Victorino December at [facility].   DISCHARGE DIAGNOSES: 1. Hematuria. 2. Hemoptysis. 3. History of pulmonary embolism and deep vein thrombosis. 4. Atrial fibrillation on Coumadin. 5. Hypertension.   6. Diabetes mellitus. 7. Hypothyroidism.   CONDITION ON DISCHARGE STATUS: Stable.   CODE STATUS: FULL CODE.  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 For the last 4 to 5 days he noticed there is some red coloration of his urine, and he has chronic complaint of sinusitis and recurrent nasal discharges so he has to strain his throat frequently so recently noted there is little bit blood also coming out with mucus and sputum when he has to scratch his throat. So he decided to come to the emergency room.   HOSPITAL STAY: 1. For his hemoptysis and hematuria, patient was admitted under observation to monitor his hemoglobin level and if any drop in the blood counts happens. For his hemoptysis, and he had history of pulmonary embolism, so V/Q scan was ordered on admission; and he was started on nasal sprays and his bronchodilators, continued on home oxygen therapy. His V/Q scan came out negative, and his complaint of presence of blood in the sputum and in mucus resolved the next day.  For hematuria, we consulted urologist, and the consult was done after 2 days. His hemoglobin was followed. His hemoglobin remained stable, and his urine remained positive for blood after 2 days with minimal amount of RBCs present in the urine. So urologist suggested to do full workup as an outpatient, which were including CT scan of the abdomen and pelvis to see for any renal abnormalities and cystoscopy  but which can be done as outpatient and they gave all the instructions to the patient, and we decided to send him home with these instructions.  2. Obstructive sleep apnea. We continued his oxygen at night, oxygen all day and CPAP at night. 3. Diabetes mellitus. Patient was taking very high dose of insulin at home 100 units of NPH 2 times a day and 30 units of short-acting insulin 3 times a day after checking his blood sugar level so we continued these dose and managed according to the blood sugar level. 4. Morbid obesity.   5. Atrial fibrillation. The patient was taking Coumadin and carvedilol. We continued those medications and checked his INR. We continued Coumadin because of very high risk of deep vein thrombosis and pulmonary embolism, and due to a high risk of stroke so this was not a life threatening bleeding and so we continued his Coumadin and the INR remained therapeutic during the hospital stay.   HOME MEDICATIONS:  ON DISCHARGE: We advised him to continue all his home medication, which were as follows.  1. Pravastatin 40 mg once a day.   2. Levothyroxine 50 mcg once a day. 3. Metolazone 2.5 mg oral tablet once a day.  4. Paroxetine 20 mg oral tablet once a day. 5. Vitamin B12 at 1000 mcg 2 tabs orally once a day. 6. Furosemide 40 mg oral tablet at 80 mg orally once a day in the morning and 40 mg in the evening.  7. Warfarin 5 mg tablet 2 tabs orally once a day. 8. Enalapril 20  mg tablet 0.5 tablet orally once a day. 9. Novolin N 100 units per mL subcutaneous injection, 100 units 2 times a day. 10. Carvedilol 6.25 mg 0.5 tablet once a day.  11. Insulin regular 100 units, 20 units 3 times a day before meals after checking blood sugar.  12. We advised him to stop his glipizide as his blood sugar level was remaining in the control on regular. 13. Oxygen use at home 2 liters nasal cannula continuous as he was using before.           ACTIVITY LIMITATIONS: As tolerated.   Referral  to Dr Lonna CobbStoioff, urologic clinic, after 1 to 2 weeks for followup work-up of his hematuria.     Total time spent for discharge 45 min. ____________________________ Hope PigeonVaibhavkumar G. Elisabeth PigeonVachhani, MD vgv:vtd D: 06/14/2012 14:22:30 ET T: 06/16/2012 10:02:56 ET JOB#: [id number]  cc: Hope PigeonVaibhavkumar G. Elisabeth PigeonVachhani, MD, <Dictator> Altamese DillingVAIBHAVKUMAR Holleigh Crihfield MD ELECTRONICALLY SIGNED 07/12/2012 18:04

## 2015-01-08 NOTE — H&P (Signed)
PATIENT NAME:  Tyler [name], Tyler [name] MR#:  [id number] DATE OF BIRTH:  Feb 24, 1956  DATE OF ADMISSION:  06/11/2012  CHIEF COMPLAINT: Hemoptysis and hematuria.   HISTORY OF PRESENT ILLNESS: Tyler [name] is a 59 year old morbidly obese white male with a history of recurrent pulmonary emboli and cigar use who presents with a four day history of hematuria. He denies any history of low back pain, stones or prostatitis. He has had occasional dysuria. He has no previous history of bladder disease, but does have a history of chronic kidney disease. His Coumadin level has been checked on a weekly basis through home monitoring and the highest it has been in the last several weeks was 2.7 earlier in the week and it was 2.2 today. His second complaint is hemoptysis. The patient states that every year in the fall and in the spring he develops sinus congestion and discharge accompanied by sore throat or cough and then starts having episodes of bright red blood, both mixed with sputum and by itself. This has been going on for several days. He also has a history of sleep apnea which he is not treating due to claustrophobia. He only uses 2.5 liters of oxygen at night. He does smoke cigars on a daily basis. He denies any fevers, chills, chest pain or shortness of breath.   PRIMARY CARE PHYSICIAN:  Dr. Victorino December, [facility].   PAST MEDICAL HISTORY:    1. 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 He has undergone multiple admissions for recurrent hemoptysis and apparently at some point had a bronchoscopy done at [facility]  2. 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  3. Morbid obesity.  4. Chronic kidney disease.  5. Obstructive sleep apnea, on O2 by nasal cannula only.  6. Diabetes mellitus, insulin requiring, with noncompliance and diet.  7. Atrial fibrillation. 8. Chronic back pain.   MEDICATIONS:  1. Warfarin 10 mg daily.  2. B12 1000 mcg injected monthly.  3. Pravastatin 40 mg daily.   4. Paxil 20 mg daily. 5. Novolin N 100 units b.i.d. or q.12h.  6. Humulin R 30 units injected subcutaneously t.i.d. with meals. 7. Glipizide 10 mg daily.  8. Lasix 80 mg daily. 9. Metolazone 2.5 mg daily.  10. Synthroid 50 mcg daily in the morning. 11. Enalapril 10 mg daily. 12. Carvedilol 3.125 mg twice daily.   PAST SURGICAL HISTORY:  1. Gallbladder removed in 2001.  2. He had testicle removed for nondistention which was benign.   ALLERGIES: He has had prior adverse reactions to lovastatin and Lipitor.   LAST HOSPITALIZATION: April 2013 for chest pain. Prior hospitalization in October 2012 for hemoptysis.   FAMILY HISTORY: His mother had DVTs and PEs. His father had a perioperative stroke after hip surgery.   SOCIAL HISTORY: He is a current smoker, having smoked cigars his whole life. He is an occasional social drinker.   REVIEW OF SYSTEMS: Negative for fever, fatigue, and weakness. No weight changes. Review of systems negative for pain, redness or cataracts. ENT: Review of systems is positive for snoring, postnasal drip, seasonal rhinitis, and dyspnea. RESPIRATORY: Review of systems is positive for hemoptysis. He has baseline dyspnea with exertion and chronic obstructive pulmonary disease. CARDIOVASCULAR: Review of systems is negative for chest pain and orthopnea, but he does have chronic 3+ pitting edema and dyspnea with exertion. GASTROINTESTINAL: Review of systems is negative for nausea, vomiting, diarrhea, or abdominal pain. He has had an episode of dysuria and hematuria per history of  present illness. He has no history of prostatitis. No hernias. He does have polyuria, nocturia and thyroid problems. He does have a history of easy bruising and bleeding secondary to Coumadin use. He does have chronic back pain. He has had no rashes or changes in skin. He has no history of numbness, weakness, dysarthria, seizure or stroke. He does have a history of anxiety managed with Paxil. No insomnia  or bipolar disorder.   PHYSICAL EXAMINATION:  GENERAL: This is a morbidly obese middle-aged male in no apparent distress.   VITAL SIGNS: Blood pressure 108/75, pulse 84 and irregular, temperature 98.9. Respirations 93% on 2 liters.   HEENT: Pupils are equal, round, and reactive to light. Extraocular movements are intact. Sclerae are anicteric. Oropharynx is benign except for poor dentition.   NECK: Supple. There is no evidence of carotid bruits. No evidence of JVD. Thyroid is nontender and nonenlarged.   LUNGS: Clear to auscultation bilaterally both anteriorly and posteriorly.   CARDIOVASCULAR: Irregular rate and rhythm with no murmurs, rubs, or gallops. He has 1+ pitting edema in lower extremity with venous stasis changes and varicose veins.   ABDOMEN: Obese, nontender, and nondistended with good bowel sounds and no evidence of hepatosplenomegaly. He is moving all extremities well with no cyanosis or kyphosis. Gait was not tested.   SKIN: Skin is warm and dry without lesions.   LYMPH: There is no cervical, axillary, inguinal, or supraclavicular lymphadenopathy.   NEUROLOGICAL: Grossly intact.   PSYCHOLOGICAL: He is alert and oriented to person, place, and time and cooperative.   ADMISSION LABORATORY DATA: Sodium 136, potassium 3.8, chloride 99, bicarbonate 27, BUN 24, creatinine 1.63, glucose 303. Liver function tests: Normal except for alkaline phosphatase of 231. CBC: White count 7.8, hemoglobin 13.4, platelets 224. PT, INR is 22 and 1.9 respectively. Urinalysis: 3+ blood, 442 red cells, 2 white cells and no bacteria. Basic natriuretic peptide 746. PA and lateral chest x-ray shows stable cardiomegaly. No effusions. Small left lower lobe atelectasis. Interstitial markings are prominent. Sinus CT shows mucosal thickening with a left maxillary sinus retention cyst. No air fluid levels.   ASSESSMENT AND PLAN:  1. Hemoptysis. It is difficult to say whether this is due to recurrent PE versus  sinus irritation. Will admit for VQ scan and start sinus regimen with flushes and steroid nasal spray. Continue Coumadin at this time for goal INR of 2.0. Hemoglobin is normal.  2. Hematuria. I have spoken with Dr. Selena BattenKim, the urologist on call, and he does not think the patient needs to be seen in health for urologic evaluation and suggested that he have an outpatient urology referral.  3. Obstructive sleep apnea. Continue 02 at night.  4. Diabetes mellitus. We will continue his regular regimen while he is here with NNR. Diabetic diet ordered.  5. Morbid obesity. Low glycemic index diet was recommended to the patient.  6. Atrial fibrillation. The patient is currently rate controlled on carvedilol.  7. Disposition: The patient is being admitted under observation status to follow hemoptysis and hematuria. We will discharge him home in 24 to 48 hours if there is no change in hemoglobin and a VQ scan is not positive for new PE.   ESTIMATED TIME OF CARE: 60 minutes.  ____________________________ Duncan Dulleresa Lundy Cozart, MD tt:ap D: 06/11/2012 20:44:01 ET T: 06/12/2012 10:31:50 ET JOB#: [id number]  cc: Duncan Dulleresa Shakeria Robinette, MD, <Dictator> Dr. Victorino DecemberJohn Kiser, [facility] Duncan DullERESA Lavoris Canizales MD ELECTRONICALLY SIGNED 06/20/2012 5:27

## 2015-01-08 NOTE — Consult Note (Signed)
PATIENT NAME:  [name], Orlandis A MR#:  [id number] DATE OF BIRTH:  [birth year]  DATE OF CONSULTATION:  06/14/2012  REQUESTING PHYSICIAN:  Dr Elisabeth PigeonVachhani CONSULTING PHYSICIAN:  Preslei Blakley C. Lonna CobbStoioff, MD  REASON FOR CONSULTATION: Hematuria.   HISTORY OF PRESENT ILLNESS: A 59 year old male presented to the emergency department on 06/11/2012 with a 4-day history of hematuria. He states his hematuria was initially light red in color and became more darker in appearance over the next few days. He had no clots. He denied dysuria, frequency, urgency. There was no flank, abdominal, pelvic, or testicular pain. He notes occasional urgency in the morning prior to voiding. He is a cigar smoker. He is on chronic anticoagulation for recurrent pulmonary emboli and has been on Coumadin for over 10 years. His INR has been therapeutic. There is no previous history of hematuria. He denies any prior history of urologic problems or prior urologic evaluation.   PAST MEDICAL HISTORY:  1. Recurrent bilateral pulmonary emboli.  2. Morbid obesity.  3. Chronic kidney disease.  4. Obstructive sleep apnea.  5. Diabetes mellitus.  6. Atrial fibrillation.  7. Chronic back pain.   PAST SURGERIES HISTORY:  1. Cholecystectomy 2001.  2. Prior orchiectomy for undescended testis.   MEDICATIONS:  1. Coumadin 10 mg daily. 2. B12 at 1000 mcg injections q. month. 3. Pravastatin 40 mg daily.  4. Paxil 20 mg daily.  5. Novolin N100 units b.i.d. 6. Humulin R 30 units subcutaneous t.i.d.  7. Glipizide 10 mg daily.  8. Lasix 80 mg daily.  9. Metolazone 2.5 mg daily.  10. Synthroid 50 mcg daily.  11. Enalapril 10 mg daily. 12. Carvedilol 3.125 mg twice daily.   ALLERGIES: Prior adverse reactions to lovastatin and Lipitor.   SOCIAL HISTORY: Current smoker-cigars. Occasional alcohol use.   FAMILY HISTORY: No previous family history of urologic problems.   REVIEW OF SYSTEMS: GENERAL: Denies fever, chills. HEENT: Positive sinusitis.   RESPIRATORY: Positive hemoptysis. CV: Negative chest pain, history of atrial fibrillation, dyspnea on exertion. GI: Negative for abdominal pain. GU: As per the HPI.  HEME: Chronic anticoagulation. PSYCH: Mild depression.    PHYSICAL EXAMINATION:   VITAL SIGNS: Temperature 907.6, pulse 60, blood pressure 122/81.   GENERAL: Patient in no acute distress.   HEENT: Conjunctivae/sclerae normal in appearance.   NECK: Negative adenopathy.   LUNGS: Clear to auscultation.   CARDIOVASCULAR: Irregular rate, rhythm without murmur.   ABDOMEN: Obese, soft, nontender.   GU: Phallus is uncircumcised, mild scrotal edema, left testis absent.  Prostate exam was deferred.  SKIN: Warm and dry.  LYMPH NODE: Generalized adenopathy.  NEUROLOGIC: Motor, sensory grossly intact.  PSYCHIATRIC: Oriented to person, place, and time, cooperative.   LABORATORIES: Admission creatinine 1.63 with repeat 06/12/2012 of 1.46. INR 1.8.  Admission urinalysis is straw cloudy with 442 RBCs, 2 WBCs. Repeat urinalysis 06/13/2012, yellow, cloudy, 513 RBCs, 18 WBCs. A urine culture is negative.   IMPRESSION: Hematuria.  Hematuria is mild. Potential causes were discussed including benign causes and less likely a urologic malignancy. Would recommend a full evaluation which can be performed as an outpatient. Ideally, a CT urogram would be indicated. However, based on his chronic kidney disease he may not be able to receive contrast. I would initially start with a noncontrast CT scan of the abdomen and pelvis to evaluate for any gross abnormalities or stones. Cystoscopy can be scheduled as an outpatient for lower tract evaluation. If no significant abnormalities are identified, he may eventually need retrograde pyelograms. This was  discussed in detail with the patient.     ____________________________ Verna Czech. Lonna Cobb, MD scs:vtd D: 06/14/2012 07:49:39 ET T: 06/14/2012 10:58:49 ET JOB#: [id number]  cc: Lorin Picket C. Lonna Cobb, MD,  <Dictator> Riki Altes MD ELECTRONICALLY SIGNED 06/17/2012 8:18

## 2015-01-10 ENCOUNTER — Encounter: Payer: Self-pay | Admitting: *Deleted

## 2015-01-11 NOTE — Discharge Summary (Signed)
PATIENT NAME:  Tyler [name], Tyler [name] DATE OF BIRTH:  1956/01/12  DATE OF ADMISSION:  02/14/2013 DATE OF DISCHARGE:  02/16/2013  For a detailed note, please check with the history and physical done on admission by Dr. Tressia Miners.  The patient actually left AGAINST MEDICAL ADVICE on 02/16/2013.   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 History of morbid obesity, chronic kidney disease. History of diabetes, diabetic retinopathy, diabetic neuropathy. Presented to the hospital with hemoptysis ongoing for the past few days.   PROBLEM: 1.  Hemoptysis.  The exact etiology of hemoptysis was unclear. There was some concern that this was related to interstitial lung disease versus chronic obstructive pulmonary disease exacerbation with interstitial pneumonia or even a pulmonary renal syndrome. The patient had a CT of the chest which did not show any mass, but showed findings consistent with interstitial lung disease. A pulmonary consult was obtained. The patient was seen by Dr. Mortimer Fries. The patient was started on high-dose IV steroids, along with empiric antibiotics to cover for chronic obstructive pulmonary disease exacerbation and atypical pneumonia. The patient's clinical symptoms after being on IV steroids and antibiotics had significantly improved. His hemoptysis had fairly resolved. His hemoglobin remained stable. He did not require any transfusions.  2.  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  The exact etiology is unclear but suspected to be secondary to underlying diabetes.  Since he was having hemoptysis, there was some concern of him having possible pulmonary renal syndrome; therefore, a nephrology consult was obtained. The patient was seen by Dr. Holley Raring, who started the serologic workup for pulmonary renal syndrome. The patient's anti-GBM antibody was negative. Ankle panel was also negative. The  patient's ANA was also negative. The patient does have chronic disease and likely should follow up with a nephrologist as an outpatient.  3.  Diabetes.  The patient was maintained on his NPH and regular insulin. His sugars were actually running a little bit high because of the high-dose IV steroids he was getting; therefore, we were adjusting his insulin as needed.  4.  Hyperlipidemia.  The patient was maintained on his Pravachol. 5.  Hypothyroidism.  The patient will be maintained on Synthroid.  6.  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  The patient is on lifelong Coumadin therapy. His Coumadin was held as he was having massive hemoptysis. His INR was only 1.9 on admission. The patient was actually clinically improving with broad spectrum antibiotics and also IV steroids, although he was getting quite upset that his sugars were running high. He was explained that the sugars are high because of the high-dose steroids, which is helping his hemoptysis and respiratory symptoms. Despite that, the patient was quite upset and decided to leave [name] on the evening of 02/16/2013. The patient was made aware of the risks of going home of having worsening bleeding. The patient was aware. He signed the [place] form and left on the evening of 02/16/2013.   TIME SPENT:  40 minutes  ____________________________ Belia Heman. Verdell Carmine, MD vjs:ce D: 02/17/2013 08:20:28 ET T: 02/17/2013 10:41:10 ET JOB#: [id number]  cc: Belia Heman. Verdell Carmine, MD, <Dictator> Henreitta Leber MD ELECTRONICALLY SIGNED 02/26/2013 20:32

## 2015-01-11 NOTE — Consult Note (Signed)
PATIENT NAME:  Tyler [name], Tyler [name] MR#:  [id number] DATE OF BIRTH:  [birth year]  DATE OF CONSULTATION:  02/15/2013  REFERRING PHYSICIAN:      Abel Presto, MD CONSULTING PHYSICIAN:  Lainey Nelson Lilian Kapur, MD  REASON FOR CONSULTATION:  Chronic kidney disease stage III, hemoptysis, evaluation for possible underlying pulmonary renal syndrome.   HISTORY OF PRESENT ILLNESS: The patient is a very pleasant 59 year old Caucasian male with multiple medical problems including frequent and bilateral PEs and DVTs, morbid obesity, chronic kidney disease stage III with baseline creatinine between 1.5 to 1.8, chronic respiratory failure secondary to obstructive sleep apnea, diabetes mellitus, diabetic retinopathy, neuropathy, chronic atrial fibrillation, spinal stenosis, diastolic heart failure, chronic lower extremity edema, hyperlipidemia who presented to [facility] with hemoptysis. He presented on 02/14/2013. The patient's hemoptysis was a witness today during evaluation. The patient reports that he has had similar episodes in the past and the last episode was approximately one year ago. The etiology of the hemoptysis is currently unclear, but he is being evaluated by pulmonary medicine, and there is a possibility that he has an underlying pulmonary blood vessel that is bleeding. We are asked to see him for evaluation and management of possible underlying pulmonary renal syndrome. As above, he does have underlying renal insufficiency. Baseline creatinine has varied from 1.5 to 1.9. At present, creatinine is  1.6 with an eGFR of 46. In review of prior urinalysis, it appears that he has had periods of protein in the urine; however, most recent urinalysis was negative for protein in the urine. As above, the patient has had long-standing diabetes mellitus.   PAST MEDICAL HISTORY: 1.  Frequent and bilateral PEs and DVTs.  2.  History of varicose veins.  3.  Morbid obesity.  4.  Chronic kidney disease  stage III, baseline creatinine between 1.5 to 1.8.  5.  Chronic respiratory failure secondary to obstructive sleep apnea.  6.  Hypercoagulable disorder.  7.  Diabetes mellitus.  8.  Diabetic retinopathy.  9.  Diabetic neuropathy.  10.  Chronic atrial fibrillation on Coumadin.  11.  History of spinal stenosis.  12.  Diastolic heart failure.  13.  Chronic lower extremity edema.  14.  Hyperlipidemia.   PAST SURGICAL HISTORY: 1.  Cholecystectomy.  2.  Testicular excision.  3.  Varicose vein stripping in the past.   ALLERGIES:  1.  LIPITOR. 2.  LOVASTATIN.  CURRENT INPATIENT MEDICATIONS: 1.  Acetaminophen 650 mg p.o. every 4 hours p.r.n. pain.  2.  Benzonatate 200 mg p.o. t.i.d.  3.  Cyanocobalamin 2000 mcg p.o. daily.  4.  NPH insulin 100 units subcutaneous b.i.d.  5.  Regular insulin 30 units subcutaneous b.i.d.  6.  Sliding scale insulin.  7.  Synthroid 0.05 mg p.o. every 6:00 a.m.  8.  Zofran 4 mg IV every 4 hours p.r.n.  9.  Protonix 40 mg p.o. every 6:00 a.m.  10.  Pravastatin 40 mg p.o. at bedtime.  11.  Azithromycin 250 mg p.o. q.24 hours.  12.  Ceftriaxone 1 gram IV every 24 hours.  13.  Fluoxetine 20 mg p.o. daily.  14.  Solu-Medrol 60 mg IV q.6 hours.  SOCIAL HISTORY: The patient lives in [place]. He is married. He has 2 children. He is currently on disability. He smokes cigars 1 to 2 per month. He denies alcohol or illicit drug use.   FAMILY HISTORY: The patient's mother had history of multiple DVTs and PEs. The patient's father had history of cerebrovascular accident.  REVIEW OF SYSTEMS:   CONSTITUTIONAL: Denies fevers, chills, weight loss.  EYES: Denies diplopia or blurry vision.   HEENT: Denies headaches, hearing loss or tinnitus.  Has had some epistaxis.  CARDIOVASCULAR: Currently denies chest pain, palpitations.  RESPIRATORY: Reports cough and hemoptysis, has chronic respiratory failure.  GASTROINTESTINAL: Denies nausea, vomiting, bloody stools.   GENITOURINARY: Denies frequency, urgency. Reports hematuria in the past but none recently.  MUSCULOSKELETAL: Has history of chronic low back pain.  INTEGUMENTARY: Denies skin lesions though he does report some chronic hyperpigmentation in his lower extremities.  NEUROLOGIC: Denies focal extremity weakness. Does have lower extremity numbness.  PSYCHIATRIC: Has history of depression.  ENDOCRINE: Denies polyuria or polydipsia. Does have history of diabetes mellitus. HEMATOLOGIC/LYMPHATIC: Denies easy bruisability, bleeding or swollen lymph nodes. ALLERGY/IMMUNOLOGIC: Denies seasonal allergies or history of immunodeficiency.   PHYSICAL EXAMINATION: VITAL SIGNS: Temperature 98.4, pulse 60, respirations 16, blood pressure 110/74, pulse oximetry 89% on 2 liters.  GENERAL: Morbidly obese Caucasian male who appears older than his stated age, currently in no acute distress.  HEENT: Normocephalic, atraumatic. Extraocular movements are intact. Pupils equal, round and reactive to light. No scleral icterus. Conjunctivae are pink.  Dried blood is noted at the nostrils. Hearing intact. Oral mucosa moist. There are does appear to be fresh blood in the posterior pharynx.  NECK: Supple without JVD or lymphadenopathy.  LUNGS: Clear to auscultation bilaterally with normal respiratory effort though obesity does degrade the exam.  HEART: S1, S2. Periods of irregularity noted. No murmurs or rubs appreciated.  ABDOMEN: Obese, soft, nontender, nondistended. Bowel sounds positive. No rebound or guarding. No gross organomegaly appreciated.  EXTREMITIES: 2+ brawny edema noted in bilateral lower extremities.  NEUROLOGICAL: The patient is alert and oriented to person and place. Strength is 5 out of 5 in both upper and lower extremities.  MUSCULOSKELETAL: No joint redness, swelling or tenderness appreciated.  SKIN: Warm and dry. Hyperpigmentation noted in bilateral lower extremities.  GENITOURINARY: No suprapubic tenderness  noted at this time.  PSYCHIATRIC: The patient has an appropriate affect and appears to have good insight into his current illness.   LABORATORY DATA: Sodium 135, potassium 3.5, chloride 100, CO2 29, BUN 29, creatinine 1.6, glucose 66, total protein 7.5, albumin 3.1, total bilirubin 0.5, alkaline phosphatase 180, AST 24, ALT 14. Troponin less than 0.02. CBC shows WBC 6.5, hemoglobin 13.4, hematocrit 38, platelets 186, INR 1.9.   IMPRESSION: This is a 59 year old Caucasian male with past medical history of frequent and bilateral pulmonary emboli and deep venous thromboses, hypercoagulable disorder, varicose veins, morbid obesity, chronic kidney disease stage III with baseline creatinine 1.5 to 1.8, chronic respiratory failure, obstructive sleep apnea, diabetes mellitus, diabetic retinopathy, peripheral neuropathy, chronic atrial fibrillation, chronic lower back pain, diastolic heart failure, bilateral lower extremity edema and hyperlipidemia who presented to [facility] with recurrent hemoptysis.   PROBLEM LIST: 1.  Chronic kidney disease, stage III.  2.  Hemoptysis.  3.  Morbid obesity.  4.  Diabetes mellitus type 2.   PLAN: The patient presents to [facility] with a recurrent hemoptysis. He reports that he had a prior episode of approximately one year ago. The etiology at that time was not determined. He continues to have ongoing or hemoptysis at this time. We were asked to see him for the possibility of underlying pulmonary renal syndrome. The patient's renal function is relatively stable at present. He most likely has underlying diabetic nephropathy. Nonetheless, he should be evaluated for underlying pulmonary renal  syndrome given the ongoing hemoptysis, though this is a bit lower on the differential. We will check a urinalysis now. We will also check urine protein to creatinine ratio. We will sendoff ANCA antibodies including MPO antibody as well as PR-3  antibody. We will also check glomerular basement membrane antibodies. He is being monitored by Dr. Mortimer Fries and if his hemoptysis continues he may be sent for selective pulmonary vessel embolization. He will certainly require follow-up for his underlying renal insufficiency once his pulmonary status has improved.   I would like to thank Dr. Verdell Carmine for this kind referral. Further plan as the patient progresses.    ____________________________ Tama High, MD mnl:ct D: 02/15/2013 12:54:51 ET T: 02/15/2013 13:46:45 ET JOB#: [id number]  cc: Tama High, MD, <Dictator> Tama High MD ELECTRONICALLY SIGNED 03/08/2013 10:49

## 2015-01-11 NOTE — H&P (Signed)
PATIENT NAME:  Tyler [name], Tyler [name] MR#:  [id number] DATE OF BIRTH:  11/20/1955  DATE OF ADMISSION:  02/14/2013  ADMITTING PHYSICIAN: Gladstone Lighter, MD  PRIMARY CARE PHYSICIAN: Lorelee Market, MD   CHIEF COMPLAINT: Coughing up blood.  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 The patient says he had had hemoptysis for a couple of years now on and off to the point sometimes it gets a lot, that he gets admitted.  His last admission for the same was about a year ago at [facility]. He had all kinds of tests done, including CT scans and also bronchoscopies, and they were not able to find out an exact reason for his bleeding. They only saw a trickle of bleeding coming from his left lower lobe but otherwise no obvious fistulas or anything. The patient said he has been doing fine up until this morning when after he ate his breakfast he had a bout of coughing followed by bright red blood whenever he coughed, and it was almost several coughs of blood.  When he came to the ER, he had 2 cups full of bright red blood here.  His blood pressure was in the 70s/50s. Right now, his coughing has stopped.  He is sitting and exertional even with minimal movement which, according to him, is his baseline.  He is able to talk.  His INR is only 1.9 while on 10 mg of Coumadin every day.  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  He is not tachycardiac, and his hemoglobin is stable, so far.   PAST MEDICAL HISTORY: 1.  History of frequent and bilateral PEs and DVTs on chronic warfarin, had to treat coagulopathic disorder.  2.  History of varicose veins.  3.  Morbid obesity.  4.  CKD stage III with baseline creatinine around 1.9.  4.  Chronic respiratory failure secondary to obstructive sleep apnea on 2.5 liters continuous home oxygen.  5.  Diabetes mellitus.  6.  Diabetic retinopathy.  7.  Diabetic neuropathy.  8.  Chronic atrial fibrillation on Coumadin.  9.  Chronic back pain and spinal stenosis.  10.  Congestive heart failure with diastolic dysfunction, ejection fraction of 55%.  11.  Chronic bilateral pedal edema.  12.  Hyperlipidemia.   PAST SURGICAL HISTORY: 1.  Cholecystectomy.  2.  Testicle removed, which was benign.  3.  Varicose vein stripping in the past.   ALLERGIES TO MEDICATIONS: LIPITOR,  LOVASTATIN.    HOME MEDICATIONS:  1.  Benzonatate 200 mg p.o. t.i.d.  2.  Carvedilol  6.25 mg p.o. daily.  3.  Cyanocobalamin 1000 mcg 1 mL injectable once a month.  4.  Enalapril 10 mg p.o. daily.  5.  Fluoxetine 20 mg p.o. daily.  6.  Lasix 40 mg p.o. at bedtime.  7.  Lasix 80 mg once in the morning.  8.  Glipizide 10 mg p.o. daily.  9.  Levothyroxine  50 mcg p.o. daily.  10.  Metolazone 2.5 mg p.o. daily.  11.  Novolin N 100 units subcutaneous twice a day.  12.  Novolin R 30 units subcutaneous twice a day.  13.  Pravastatin 40 mg p.o. daily.  14.  Vitamin B12, 1000 mcg 2 tablets p.o. daily.  15.  Coumadin 10 mg p.o. daily.   SOCIAL HISTORY: Lives at home with his wife, uses a wheelchair as unable to walk secondary to his dyspnea on exertion and also severe arthritis and back pain.  Occasionally smokes cigars but does not smoke any cigarettes.  Social drinker.  FAMILY HISTORY: Mom with multiple DVTs and PE.  Father had a  stroke.  REVIEW OF SYSTEMS:  CONSTITUTIONAL: No fever. No fatigue or weakness.  EYES: Positive for blurred vision, especially in the right eye, no glaucoma, positive for cataracts.  ENT: No tinnitus, left ear pain occasionally, no hearing loss, positive for snoring and obstructive sleep apnea. No discharge or epistaxis.  RESPIRATORY: Positive for cough. Positive for hemoptysis. No wheezing or COPD.  CARDIOVASCULAR: No chest pain. Positive for orthopnea. Positive for dyspnea on exertion and also chronic a-fib. No palpitations or syncope.  GASTROINTESTINAL: No nausea, vomiting, diarrhea, abdominal pain, hematemesis or melena.  GENITOURINARY: No dysuria, hematuria, renal calculus, frequency or incontinence.  ENDOCRINE: No polyuria, nocturia, thyroid problems, heat or cold intolerance.  HEMATOLOGY: Positive for bleeding and anemia.  SKIN: No acne, rash or lesions.  MUSCULOSKELETAL: Positive for low back pain and also arthritis, no gout.  NEUROLOGICAL: No numbness, weakness, CVA, TIA or seizures. Positive for peripheral neuropathy.  PSYCHOLOGICAL: No anxiety, insomnia, depression.   PHYSICAL EXAMINATION: VITAL SIGNS: Temperature 97.7 degrees Fahrenheit, pulse 78, respirations 30, blood pressure 92/52, pulse oximetry 92% on 3 liters oxygen.  GENERAL: Heavily built male sitting in a chair in mild respiratory distress which, according to him, is chronic.  HEENT: Normocephalic, atraumatic. Pupils are equal, round, reacting to light. Anicteric sclerae. Extraocular movements are intact.  Pale conjunctivae.  Nasopharynx without any discharge or lesions. Ears:  Clean auditory canal and no external lesions or discharge. Oropharynx with traces of fresh blood but otherwise clear without erythema, mass or exudates.  NECK: Supple. No thyromegaly, JVD or carotid bruits. No lymphadenopathy. Trachea and thyroid are in midline.  LUNGS: Clear to auscultation bilaterally. Decreased bibasilar breath sounds. No wheeze  or crackles. Minimal use of accessory muscles on exertion.  CARDIOVASCULAR: S1, S2 regular rate and rhythm. No murmurs, rubs or gallops.  ABDOMEN: Obese, soft, nontender, nondistended. No hepatosplenomegaly. Normal bowel sounds.  EXTREMITIES: Have 3+ edema which is chronic with both lower extremities. Feeble dorsalis pedis pulses palpable. No clubbing or cyanosis.  SKIN: No acne, rash or lesions.  LYMPHATICS: No cervical or inguinal lymphadenopathy.  NEUROLOGIC: Cranial nerves II through XII are intact. Strength is 4 out of 5 in the lower extremities and 5 out of 5 in the upper extremities.  Lower extremity strength is limited mostly by his arthritis and also pedal edema.  Sensation is intact except in the feet from his peripheral neuropathy, decreased to light touch. Normal cerebellar function tests.   PSYCHOLOGIC: The patient is alert, oriented x 3. Judgment remains intact.  Mood within normal limits. No delusions or hallucinations. Not suicidal or homicidal.   LABORATORY AND RADIOLOGICAL DATA:  WBC 8.9, hemoglobin 14.0, hematocrit 40.3, platelet count 218. Sodium 134, potassium 3.5, chloride 97, bicarbonate 30, BUN 28, creatinine 1.9, glucose of 141 and calcium of 8.9.  ALT 14, AST 24,  alk phos 180, total bilirubin 0.5 and albumin of 3.1.  His PTT is elevated at 37.2. INR is 1.9. CK 65, CK-MB 1.7. Troponin less than 0.02.  EKG showing atrial fibrillation and flutter, heart rate of 82 at this time.    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 He is also noted to be hypotensive here.   1.  Hemoptysis with INR 1.9 on Coumadin:  According to  the patient, he has had several admissions for the same, last one about a year ago. Had CT scans of his chest and bronchoscopy done with no reason for hemoptysis found. Since he has stopped bleeding at this time, and his hemoglobin is stable, we will hold off on reversing his INR, especially with his high risk for recurrent DVTs and PEs. I will hold off Coumadin. I will get a Pulmonary consult. He had a CT chest without contrast ordered here. If he bleeds again, he might need a repeat bronchoscopy. Differentials could be just mucosal bleeding from the lung while on Coumadin, arteriobronchial fistula or wegeners granulomatosis .  We will hold off on doing all the ANA tests, and we will get the records from [facility] at this time. Repeat hemoglobin check every 8 hours and transfuse as needed.  2.  Hypotension: Could be hypovolemic/hemorrhagic shock.  Hold Coreg and Lasix and enalapril at this time.  IV fluids and watch for any CHF since his medications are being held and fluids are given. Hemoglobin check and transfuse as needed and pressors, if needed. Will admit the patient and monitor him in the CCU.  3.  Diabetes mellitus: Continue his insulin and sliding scale insulin.  4.  Chronic kidney disease, stage III:  His creatinine is at 1.98, which is close to his baseline, so continue to monitor.  5.  Recurrent deep venous thrombosis and pulmonary emboli:  Hold Coumadin for now until his hemoptysis resolves. According to the patient in the past admissions, once his tests were down,  once his hemoptysis was resolved, he was always discharged back on the Coumadin. 6.  Gastrointestinal and deep vein thrombosis prophylaxis.   CODE STATUS:  FULL CODE.     TOTAL CRITICAL CARE TIME SPENT ON THIS ADMISSION:  65 minutes.    ____________________________ Gladstone Lighter, MD rk:cb D: 02/14/2013 22:20:26 ET T: 02/14/2013 22:50:29 ET JOB#: [id number]  cc: Gladstone Lighter, MD, <Dictator> Meindert A. Brunetta Genera,  MD Gladstone Lighter MD ELECTRONICALLY SIGNED 03/13/2013 20:35

## 2015-01-11 NOTE — H&P (Signed)
PATIENT NAME:  Tyler [name], Tyler [name] MR#:  [id number] DATE OF BIRTH:  31-Aug-1956  DATE OF ADMISSION:  10/03/2012  PRIMARY CARE PHYSICIAN:  Evelene Croon, MD  PRIMARY CARDIOLOGY:  Arnoldo Hooker, MD  REFERRING PHYSICIAN:  Lucrezia Europe, MD.  CHIEF COMPLAINT: Shortness of breath.   HISTORY OF PRESENT ILLNESS: This is a 59 year old male with a significant past medical history of diastolic CHF, last echo done in April of last year showing ejection fraction of 50% with concentric hypertrophy, chronic respiratory failure on home oxygen due to CHF and pulmonary hypertension, recurrent DVT and PE, history of A. fib back to normal sinus rhythm, diabetes mellitus, chronic kidney disease, obstructive sleep apnea on nasal cannula and chronic back pain. The patient presents for complaints of shortness of breath, worsening lower extremity edema and not able to sleep at night due to shortness of breath. The patient reports he saw his primary care physician last week, was complaining of cough where he was prescribed Levaquin for bronchitis, but reports it did not help him much. He reports history of shortness of breath mainly upon lying supine. Reports over the last 3 weeks he has not been able to sleep only a couple of hours every night due to shortness of breath.  He reports he has been sleeping in a sitting  position.  As well, the patient has been complaining of pleuritic chest pain mainly with his cough.  He was found to have elevated pro BNP at 1373, and his chest x-ray did show some vascular congestion.  He is known to have chronic kidney disease with baseline creatinine of 1.5 to 1.6. His creatinine today was 1.98. The patient reports he has been compliant with his medication. As well, he has been compliant with diet, had no extra salt intake. As well, the patient is known to have history of DVT and PE where his  INR has been found to be 1.7.  As well, he is know to have history of A. fib, known history of  paroxysmal atrial fibrillation with his EKG showing normal sinus rhythm.   PAST MEDICAL HISTORY: 1.  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  2.  History of DVT on warfarin.  3.  History of varicose veins with recurrent DVT on chronic warfarin. 4.  Morbid obesity. 5.  Chronic kidney disease.  6.  Obstructive sleep apnea on home O2 nasal cannula.  7.  Diabetes mellitus, insulin requiring with noncompliance with diet.  8.  Atrial fibrillation.  9.  Chronic back pain.  10.  Diastolic dysfunction and congestive heart failure. Last echo in April 2013 showing ejection fraction of 50%.  11.  Chronic pulmonary heart disease with pulmonary hypertension from sleep apnea, DVT and PE.  12.  Hyperlipidemia.   ALLERGIES:   1.  LIPITOR. 2.  LOVASTATIN.  HOME MEDICATIONS: 1.  Enalapril 10 mg oral daily.  2.  Warfarin 10 mg oral daily.  3.  Paroxetine 20 mg oral daily.  4.  Glipizide 10 mg oral daily.  5.  Insulin regular 20 units before meals.  6.  Insulin, Novolin N 100 units before breakfast and 100 units before supper.  7.  Metformin 850 three times a day.  8.  Glipizide 10 mg extended release daily.  9.  Pravastatin 40 mg daily.  10.  Coreg 3.125 mg oral every morning.  11.  Lasix 80 mg at bedtime. Lasix 80 mg in the morning. 12.  Metolazone 2.5 mg once daily.  13.  Levaquin 500 mg oral every, tablet daily started recently for acute bronchitis.  14.  Levothyroxine 50 mcg oral daily.  15.  Vitamin B12 daily.   PAST SURGICAL HISTORY: Gallbladder removal 2001.  Testicle removed for non-distention which was benign.   FAMILY HISTORY: Mother had DVT and PE. Father had CVA.   SOCIAL HISTORY: Current smoker, smokes 1 to 2 cigars every week. Occasional social drinker.   REVIEW OF SYSTEMS:  The patient had multiple complaints. Denies any fever or chills. Complains of fatigue and weakness.  EYES: Denies blurry vision, double vision, pain.  ENT: Denies tinnitus, ear pain,  hearing loss, epistaxis or discharge.  RESPIRATORY: Complains of cough, dyspnea, nonproductive sputum, no wheezing.  CARDIOVASCULAR: Has pleuritic chest pain when coughing. Has worsening edema. Denies any syncope complains of dyspnea on exertion.  GASTROINTESTINAL: Denies nausea, vomiting, diarrhea, constipation. Has complaints of abdominal pain.  GENITOURINARY: Denies dysuria, hematuria or renal colic.  ENDOCRINE:  Denies polyuria, polydipsia, heat or cold intolerance.  HEMATOLOGY: Denies easy bruising or bleeding, but has complaints has a history of multiple DVTs and PEs.  INTEGUMENTARY: Has chronic lower extremity venous stasis.  Denies any rash or acne.  MUSCULOSKELETAL: Denies any gout, cramps. Has complaints of lower back pain. Has no (Dictation Anomaly)<MISSING TEXT> activity.  NEUROLOGIC:  Denies any dementia, headache, CVA, TIA, seizures.  PSYCHIATRIC: Denies anxiety, insomnia, schizophrenia, nervousness, bipolar disorder.   PHYSICAL EXAMINATION: VITAL SIGNS: Temperature is 97.9, pulse 82, respiratory rate 20, blood pressure 103/54, saturating 95% on cannula.  GENERAL: Morbidly obese male, sitting on recliner in no apparent distress.  HEENT: Head atraumatic, normocephalic.  Pupils equal, reactive to light. Pink conjunctivae. Anicteric sclerae. Moist oral mucosa.  NECK: Supple. No thyromegaly. No JVD.  CHEST: Good air entry bilaterally. No wheezing, rales with mild bibasilar crackles.  CARDIOVASCULAR: S1, S2 heard. No rubs, murmur or gallops.  ABDOMEN: Obese, soft, nontender, nondistended. Bowel sounds present.  EXTREMITIES: The patient had +3 to +4 bilateral edema. Pedal pulses could not be felt secondary to his edema, but was able to be felt by venous Doppler by ED physician.  Has and venostasis changes and varicose pain.  SKIN: Warm and dry. Normal skin turgor.  LYMPHATICS:  No cervical, axillary, inguinal or supraclavicular lymphadenopathy.  NEUROLOGIC:  Cranial nerves II through  XII are  grossly intact. Motor:  Moves all extremities without significant deficits.   PSYCH: Awake, alert, awake, alert, oriented x 3 cooperative, pleasant.   PERTINENT LABORATORY AND DIAGNOSTIC DATA:  Glucose 134, pro BNP 1373, BUN 34, creatinine 1.98, sodium 132, potassium 4, chloride 96, CO2 27. Troponin less than 0.02. White blood cells 10.1, hemoglobin 12.5, hematocrit 38.7, platelets 267. INR 1.7. EKG showing multiple ST and T-wave changes which are old when compared.   ASSESSMENT AND PLAN: 1.  Shortness of breath. This is multifactorial mainly due to acute diastolic congestive heart failure on top of his known baseline history of pulmonary hypertension.  2.  Acute congestive heart failure. This is most likely diastolic. Last echocardiogram was done in April of last year. Will repeat echo. Will continue him on gentle diuresis, given the fact he has an elevated creatinine and his blood pressure is on the lower side.  We will continue him on beta blockers. We will hold his enalapril at this point because of renal failure and low blood pressure. We will continue to cycle troponins.   3.  We will have him on strict ins and outs and daily weights.  4.  Chest pain, pleuritic in quality, mainly reproduced with cough, does not appear to be cardiac so we will continue to cycle troponins.  5.  Acute on chronic renal failure. Will hold enalapril and metformin. Will monitor closely as the patient is on diuresis.  6.  Acute, bronchitis.  We will discontinue Levaquin due to his renal failure. We will have him on Augmentin.  7.  Diabetes mellitus. Will hold his metformin. We will continue glyburide and will resume his Novolin N and regular insulin at a lower dose.  As well, we will add insulin sliding scale for him.  8.  History of pulmonary embolism and deep vein thrombosis.  The patient is on warfarin. Will consult pharmacy to dose his warfarin.  9.  History of atrial fibrillation, currently in normal sinus  rhythm on anticoagulation.  10.  Hyperlipidemia. Continue with statin.  11.  Deep vein thrombosis prophylaxis. The patient is on warfarin.  12.  Gastrointestinal prophylaxis.  The patient is on proton pump inhibitor.  CODE STATUS: Full code.   Time spent on admission and patient care: 65 minutes.    ____________________________ Starleen Armsawood S. Shavon Ashmore, MD dse:ct D: 10/03/2012 04:29:59 ET T: 10/03/2012 10:31:18 ET JOB#: [id number]  cc: Starleen Armsawood S. Akil Hoos, MD, <Dictator> Meindert A. Lacie ScottsNiemeyer, MD Kewon Statler Teena IraniS Keia Rask MD ELECTRONICALLY SIGNED 10/07/2012 7:36

## 2015-01-11 NOTE — Discharge Summary (Signed)
PATIENT NAME:  Tyler [name], Tyler [name] MR#:  [id number] DATE OF BIRTH:  [birth year]  DATE OF ADMISSION:  10/03/2012 DATE OF DISCHARGE:  10/04/2012  PRESENTING COMPLAINT:  Cough and shortness of breath.   DISCHARGE DIAGNOSES:   1.  Acute bronchitis.  2.  Acute on chronic diastolic congestive heart failure.  3.  Hypertension.  4.  Chronic venous insufficiency.   CONDITION ON DISCHARGE:  Fair.   MEDICATIONS:   1.  Pravastatin 40 mg p.o. daily.  2.  Levothyroxine 50 mcg p.o. daily.  3.  Metolazone 2.5 mg daily.  4.  Paxil 20 mg daily.  5.  PEG oral powder for _____ in 8 ounces of water 1 packet daily.  6.  Vitamin B12, 1000 mcg 2 tablets daily.  7.  Warfarin 5 mg 2 tablets daily.  8.  Novolin N 100 units twice [name] day.  9.  Glipizide 10 mg extended release p.o. daily.  10.  Carvedilol 6.25 mg 1/2 tablet, that is 3.125 p.o. daily.  11.  Enalapril 10 mg p.o. daily.  12.  Lasix 80 mg 1-1/2 tablets at bedtime and 80 mg 1 tablet in the morning.  13.  Insulin regular 20 units 3 times [name] day.  14.  Augmentin 875 mg p.o. b.i.d. for 5 more days.  15.  The patient was advised not to take metformin given his elevated creatinine.   FOLLOWUP:  Follow up with Dr. Lacie ScottsNiemeyer in 1 to 2 weeks.   DIAGNOSTIC DATA:  White count is 9.2, CBC within normal limits. Basic metabolic panel is within normal limits except creatinine of 1.68. PT-INR is 20.8 and 1.8. On echo Doppler, left atrium is mildly dilated and EF is more than 55%. The right ventricular is moderately dilated. There is moderate to severe tricuspid regurgitation. Right systolic ventricular pressure is greater than 60 mmHg. Cardiac enzymes x 3 are negative. Chest x-ray: There is bilateral diffuse interstitial thickening likely representing interstitial edema versus interstitial pneumonitis. LFTs are within normal limits. UA is negative for UTI. B-type natriuretic peptide is 1373.   BRIEF SUMMARY OF HOSPITAL COURSE:  The patient is [name] morbidly  obese gentleman with history of diastolic heart failure, chronic lower extremity edema who comes in with vascular congestion as noted on chest x-ray with some cough. The patient's enalapril was resumed prior to discharge. He was admitted with:  1.  Acute on chronic diastolic congestive heart failure in the setting of acute bronchitis. Chest x-ray showed mild pulmonary vascular congestion. His Lasix was changed to IV, continued on Coreg and metolazone. His urine output was remarkable with 4100 mL since admission. The patient remained hemodynamically stable with sats more than 92% on 2 to 3 liters nasal cannula oxygen. The patient felt at baseline prior to discharge.  2.  Acute bronchitis. Augmentin was started. Tessalon Perles were added _____.  3.  Acute on chronic renal failure. The patient was continued on Lasix. His metformin was discontinued.  4.  Type 2 diabetes. NPH and regular insulin along with glipizide were continued. Metformin was held due to elevated creatinine.  5.  History of PE and DVT. The patient is on warfarin which was continued.  6.  Hospital stay otherwise remained stable. The patient remained [name].   TIME SPENT:  40 minutes.    ____________________________ [name]. Allena KatzPatel, MD sap:si D: 10/04/2012 14:55:22 ET T: 10/04/2012 20:41:19 ET JOB#: [id number]  cc: Zykia Walla [name]. Allena KatzPatel, MD, <Dictator> [name]. Lacie ScottsNiemeyer, MD Willow OraSONA [name] Danira Nylander MD ELECTRONICALLY  SIGNED 10/14/2012 21:30

## 2015-01-12 NOTE — Op Note (Signed)
PATIENT NAME:  Tyler [name], [name] MR#:  [id number] DATE OF BIRTH:  Oct 17, 1955  DATE OF PROCEDURE:  05/30/2014  PREOPERATIVE DIAGNOSES: 1.  Acute pulmonary embolism.  2.  History of deep venous thrombosis.  3.  History of gastrointestinal bleed.  4.  Symptomatic abdominal wall hernia.   POSTOPERATIVE DIAGNOSES: 1.  Acute pulmonary embolism.  2.  History of deep venous thrombosis.  3.  History of gastrointestinal bleed.  4.  Symptomatic abdominal wall hernia.   PROCEDURES PERFORMED: 1.  Inferior venacavogram.  2.  Placement of infrarenal inferior vena caval filter, Denali type by Bard.   SURGEON: Renford DillsGregory G. Arlind Klingerman, MD   SEDATION: Versed 3 mg plus Benadryl 25 mcg.   CONTRAST USED: Isovue 25 mL.   FLUOROSCOPY TIME: 1.4 minutes.   INDICATIONS: [name] is a 59 year old gentleman admitted with abdominal chest pain, as well as shortness of breath. He was noted to have a left lower lobe PE on CT scan. He has been started on heparin, but has a history of multiple GI bleeds, as well as hemoptysis and has not done well on long-term anticoagulation. He is also complaining of a painful abdominal wall hernia, which will need to be assessed by general surgery for possible repair. Risks and benefits of insertion of a filter were reviewed. All questions are answered. The patient has agreed to proceed.   DESCRIPTION OF PROCEDURE: The patient is taken to the special procedure suite and placed in the supine position. After adequate Versed administration has been given, he is positioned supine, and his right groin is prepped and draped in a sterile fashion.   Appropriate timeout is called.   Ultrasound is then used to identify the vein. The vein is echolucent and compressible indicating patency. Image is recorded for the permanent record. Under real-time visualization, the Seldinger needle is used to access the vein. Given his body habitus, an Amplatz Super Stiff wire is advanced. On fluoroscopy,  the wire appears to be on the left side of the spine, and therefore a KMP catheter is advanced. Hand injection of contrast demonstrates that this is actually the vein and that the anatomy is distorted based on his very large girth and rather significant scoliosis. Ultimately, the Kumpe catheter and wire are advanced into the right atrium to further document that this is the venous side.   With the Amplatz wire placed at this level, the delivery sheath and dilator are advanced and a bolus injection of contrast is utilized. Renal veins are localized at the top level of T2. The markers on the catheter are measured to ensure that the system is calibrated properly, which it is, and subsequently the vena cava is measured below the level of the renal veins and is noted to be 27 mm in diameter, adequate for filter placement. The wire is then reintroduced. The sheath is advanced to the proper level, which is T2, and the wire and dilator are removed. A Denali filter is then advanced and deployed without difficulty and excellent orientation. The sheath is pulled, pressure held, and there are no immediate complications.   INTERPRETATION: The inferior vena cava is opacified with bolus injection of contrast. There are no filling defects noted. There is reflux of contrast into both renal veins, which are therefore easy to localize, and the vena cava just below this level measures 27 mm in diameter. The filter is deployed with good orientation.    ____________________________ Renford DillsGregory G. Donnavan Covault, MD ggs:nb D: 05/30/2014 19:12:32 ET T: 05/30/2014 22:43:53  ET JOB#: [id number]  cc: Renford Dills, MD, <Dictator> Renford Dills MD ELECTRONICALLY SIGNED 07/09/2014 20:40

## 2015-01-12 NOTE — H&P (Signed)
PATIENT NAME:  Tyler [name], Ark A MR#:  [id number] DATE OF BIRTH:  1955-12-28  DATE OF ADMISSION:  05/30/2014  REFERRING PHYSICIAN:  Enedina Finnerandolph N. Manson PasseyBrown, MD.  PRIMARY CARE PHYSICIAN:  Meindert A. Lacie ScottsNiemeyer, MD.  ADMISSION DIAGNOSIS:  Pulmonary embolism.   HISTORY OF PRESENT ILLNESS: The patient is a 59 year old Caucasian male who presents to the Emergency Department complaining of abdominal pain. The patient admits that he was having some chest pain with deep inspiration as well, but the majority of his discomfort was coming from his abdomen that he thought may be radiating into his chest. Laboratory evaluation in the Emergency Department revealed an elevated D-dimer which prompted imaging of his chest, which showed a large, occlusive clot to his left lower lobe, which prompted Emergency Department to call for admission.   REVIEW OF SYSTEMS: CONSTITUTIONAL: The patient denies fever or weakness.  EYES: The patient denies diplopia or inflammation.  EARS, NOSE AND THROAT: Denies tinnitus or difficulty swallowing.  RESPIRATORY: The patient denies cough, but admits to some shortness of breath, as well as pain with deep inspiration and dyspnea on exertion (this latter complaint is chronic).  CARDIOVASCULAR: The patient denies chest pain or orthopnea.  GASTROINTESTINAL: The patient denies nausea, vomiting, diarrhea or abdominal pain.  GENITOURINARY: The patient denies dysuria, increased frequency or hesitancy.  ENDOCRINE: The patient denies polyuria or nocturia.  HEMATOLOGIC AND LYMPHATIC: The patient denies easy bleeding, but admits to easy bruising and color changing off of his skin.  INTEGUMENTARY: Denies rashes, but admits to multiple lesions on his legs and back.  MUSCULOSKELETAL: The patient admits to numbness in his soles as well as pain in his calves at times.  NEUROLOGIC: The patient denies weakness in his extremities and denies dysarthria.  PSYCHIATRIC: The patient denies depression and  suicidal ideation.   PAST MEDICAL HISTORY: Obstructive sleep apnea, diabetes type 2, COPD, venous stasis dermatitis, and recurrent clots. History of GI bleed.   PAST SURGICAL HISTORY: Cholecystectomy, vein stripping and unilateral orchiectomy.  FAMILY HISTORY: Clotting disorder in his mother, who is deceased of a pulmonary embolism, diabetes type 2, and coronary artery disease.   SOCIAL HISTORY: The patient lives with his wife.  The patient admits to smoking cigars every once in a while and he is an occasional drinker.  MEDICATIONS:  1.  Losartan 25 mg 1 tablet p.o. daily.  2.  Paroxetine 20 mg 1 tablet p.o. daily.  3.  Novolin 100 units subcutaneously 2 times a day at breakfast and supper.  4.  Novolin sliding scale insulin.  5.  Pravastatin 40 mg 1 tablet p.o. daily.  6.  Carvedilol 6.25 mg 1 tablet p.o. daily.  7.  Furosemide 80 mg 1 tablet p.o. b.i.d.  8.  Metolazone 2.5 mg 1 tablet p.o. daily.  9.  Bentyl 10 mg 2 capsules p.o. 1-3 times a day as needed for pain.  10. Levothyroxine 100 mcg 1 tablet p.o. daily.  11. B12 injections once a month.  12. Vitamin B12 1000 mcg oral tablets 2 p.o. b.i.d.   ALLERGIES: LIPITOR, LOVASTATIN.   LABORATORY DATA: Glucose 260, BUN 21, creatinine 1.74, sodium 134, potassium 3.3, lipase 121, serum albumin 3.2, total bilirubin 1.2, alkaline phosphatase 326, AST 22, ALT 12, troponin is negative. INR 1.1, D-dimer is 1034.   Urine is negative for infection.   RADIOGRAPHIC FINDINGS: The patient had a chest x-ray that shows cardiomegaly and bronchitic changes in the left basilar and retrocardiac areas that are likely atelectasis, as well as development  of a mild compression deformity involving the superior endplate of T11 vertebral body.    CTA of the chest shows an occlusive thrombus within the proximal lobar pulmonary artery supplying the left lobe. The main pulmonary is also dilated indicating right heart strain. There is a moderate pericardial  effusion and there some ground-glass opacities in both lungs that are present on previous studies dating back to 2012. This could represent nonspecific interstitial pneumonitis.   PHYSICAL EXAMINATION:  VITAL SIGNS: Temperature is 98.6, pulse 56, respirations 18, blood pressure 128/76, pulse oximetry 94% on 3 liters of oxygen via nasal cannula.  GENERAL: The patient is alert and oriented x 3, in no apparent distress.  HEENT: Normocephalic, atraumatic. Pupils equal, round, and reactive to light and accommodation. Extraocular movements are intact. Mucous membranes are moist.  NECK: Trachea is midline. No adenopathy.  CHEST: Symmetric and atraumatic.  CARDIOVASCULAR: Regular rate and rhythm. Normal S1, S2. No rubs, clicks, or murmurs.  LUNGS: Clear to auscultation bilaterally. Normal effort and excursion.  ABDOMEN: Positive bowel sounds, exquisitely tender in all quadrants with distention not only to the left of midline but also periumbilically and inferiorly. Some of the abdomen is also somewhat indurated and there is some discoloration that may simply be difference in venous flow or a livedo reticularis-type appearance. The patient does have some rebound tenderness as well as voluntary guarding. He moves around, however, as though he does not have peritonitis.  GENITOURINARY: Deferred.  MUSCULOSKELETAL: The patient moves all 4 extremities equally.  SKIN: The patient has multiple oval-shaped lesions on his lateral extremities as well as on his back. There are healed lesions on his abdomen, as well. None are draining at present, but some are scabbed.  EXTREMITIES: No clubbing or cyanosis but there is discoloration of the shins down to the feet, which are also very edematous. This edema is nonpitting and nontense.  NEUROLOGIC: Cranial nerves II-XII are grossly intact.  PSYCHIATRIC: Mood is normal. Affect is congruent.   ASSESSMENT AND PLAN: This is a 59 year old man with a large pulmonary embolism.   1.  Pulmonary embolism occlusive to the entire left lower lobe. The patient would certainly benefit from anticoagulation at this time as the risk of the blood clot burden growing certainly outweighs the risk of gastrointestinal bleed at this moment. We will start heparin but it seems wiser that the patient should have a filter. I will order a vascular surgery consult for that.  2.  Abdominal pain. Exam is very dramatic, although not convincing for peritonitis at this time. I have ordered a lactic acid as well as a plain abdominal film, but the patient may need a CTA of the abdomen to rule out mesenteric ischemia secondary to a clot.  3.  Chronic kidney disease, stage III.  He seems to be at his baseline and is stable. We will try to avoid nephrotoxic agents.  4.  Diabetes type 2. We will start the patient on scheduled insulin as he takes at home as well as sliding scale.  5.  Chronic obstructive pulmonary disease. Continue the patient's home regimen of inhalers.  6.  Venous stasis dermatitis. The patient needs compression stockings. We will try to get him measured for these prior to discharge.  7.  Deep vein thrombosis. I have ordered SCDs for the patient's legs. He has also already been started on heparin for DVT treatment.  8.  GI prophylaxis. Currently unnecessary.   CODE STATUS:  The patient is a full code.  Time spent on admission orders and patient care: Approximately 45 minutes.    ____________________________ Kelton Pillar. Sheryle Hail, MD msd:lt D: 05/30/2014 06:47:16 ET T: 05/30/2014 07:44:21 ET JOB#: [id number]  cc: Kelton Pillar. Sheryle Hail, MD, <Dictator> Kelton Pillar Kathalene Sporer MD ELECTRONICALLY SIGNED 06/09/2014 0:06

## 2015-01-12 NOTE — H&P (Signed)
PATIENT NAME:  GUNTER, CONDE MR#:  [id number] DATE OF BIRTH:  1955-11-07  DATE OF ADMISSION:  10/30/2013  PRIMARY CARE PHYSICIAN:  Dr. Brunetta Genera.   EMERGENCY ROOM PHYSICIAN: Dr. Winfred Leeds.  CHIEF COMPLAINT: Generalized weakness.   HISTORY OF PRESENT ILLNESS: This patient is a 59 year old male patient brought in by the wife because of generalized weakness getting progressively worse for the past 2 weeks. The patient been having cough and also fever for about 2 weeks. Went to Dr. Brunetta Genera when the symptoms started, that is 2 weeks ago, and was given Levaquin. He finished the course, but symptoms restarted again with body aches, poor p.o. intake, nausea, cough and also low-grade temperature, around 99 to 100. The patient also has very poor appetite and not eating at all. The patient's chest x-ray showed pneumonia-like infiltrate in the left base, and we are admitting for pneumonia with failed outpatient therapy and also with a poor p.o. intake. The patient denies any chest pain, has some trouble breathing, but not out of ordinary. Has cough and also green-colored phlegm for 2 weeks. The patient also has generalized malaise and also low-grade temp, around 99 to 100.   PAST MEDICAL HISTORY:  Significant for CKD stage III.  The patient was admitted in May last year signed out AMA. The patient has a history of sleep apnea, COPD on home oxygen 2.5 liters all of the time, history of diastolic heart failure, EF 55%; history of diabetes. The patient has a history of DVT and PE.   According to past notes, the patient has  factor deficiency,, but the patient unable to tell me what it was.  He has a history of morbid obesity and has a history of diabetic neuropathy, chronic atrial fibrillation and chronic back pain and spinal stenosis and chronic bilateral pedal edema and hyperlipidemia.   PAST SURGICAL HISTORY:  1.  Cholecystectomy.  2.  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   3.  Varicose vein  stripping in the past.  ALLERGIES:  LIPITOR AND LOVASTATIN. The patient can take pravastatin.   SOCIAL HISTORY: Lives at home with the wife and uses a wheelchair, and unable to walk due to dyspnea on exertion and arthritis. Occasionally, the patient smokes 1 to 2 cigars a day and occupational drinker.   FAMILY HISTORY: Mother has multiple DVTs and PE. Father had a stroke.   MEDICATIONS: Coreg 6.25 mg p.o. b.i.d., cyanocobalamin 1000 mcg once a month, enalapril 10 mg p.o. daily, fluoxetine 20 mg daily, furosemide 80 mg in the morning, levothyroxine 50 mcg daily, metolazone 2.5 mg p.o. daily, Novolin N 100 units/mL takes 100 units subQ b.i.d., Novolin R recombinant insulin 100 units according to sliding scale, pravastatin 40 mg p.o. daily, vitamin B12, 1000 mcg 2 tablets once a day.   REVIEW OF SYSTEMS: CONSTITUTIONAL: Has fever for 2 weeks and also feels very weak and tired.  EYES: No blurred vision. Has a history of diabetic retinopathy.  ENT: No tinnitus. No epistaxis. No difficulty swallowing.  RESPIRATORY: Has cough with green phlegm for 2 weeks, and the patient has a history of COPD.  Denies any hemoptysis.  CARDIOVASCULAR: No chest pain. Does have pedal edema and also history of chronic shortness of breath due to COPD and sleep apnea. The patient has no palpitations.  GASTROINTESTINAL: Feels nauseous, very poor p.o. intake for 2 weeks. No hematemesis. The patient has a history of constipation.  GENITOURINARY: No dysuria.  ENDOCRINE: No polyuria or nocturia. Has diabetes.  INTEGUMENTARY:  Shows skin rashes in both legs and also chronic lymphedema.  MUSCULOSKELETAL: Has chronic back pain and spinal stenosis.  NEUROLOGIC: Has diabetic neuropathy. The patient denies any epilepsy or tremors. No CVA. No memory loss.  PSYCHIATRIC: No anxiety or insomnia.   PHYSICAL EXAMINATION: VITAL SIGNS: Temperature initially 98.6. Blood pressure in triage 84/48; after 2 liters of bolus, the patient's blood  pressure still is around 90s/60s. The patient's heart rate is around 62 initially, but during my visit, heart rate did drop to around 37 to 40, and it stayed low in the 40s. The patient's oxygen saturation 95% on 2 liters.  GENERAL: Alert, awake, oriented. Well-developed, well-nourished male, not in distress, answering questions appropriately.  HEENT:  Head: Normocephalic, atraumatic. Eyes: Pupils equally reacting to light. No conjunctivitis. No scleral icterus.  Nose: No nasal lesions. No drainage. Ears: No drainage. No lesions.  MOUTH: No lesions. No exudates.  NECK: Supple. No JVD. No carotid bruit. Thyroid in the midline.  RESPIRATORY:  Good respiratory effort, mostly clear to auscultation. Did not hear any wheeze.  CARDIOVASCULAR: Regular rate and rhythm. The patient has bradycardia. I did not hear any murmurs. The patient's pulses are equal at carotid and femoral. Pedal pulses unable to get because of peripheral edema. The patient does have 2+ edema up to knees bilaterally.  GASTROINTESTINAL: Abdomen nontender, nondistended. Bowel sounds present. The patient has obese abdomen.  MUSCULOSKELETAL: The patient's extremities move x 4.  SKIN: Inspection is normal, well hydrated.  LYMPHATICS: No cervical lymphadenopathy.  NEUROLOGIC: Cranial nerves II through XII intact. DTRs 2+ bilaterally, upper and lower extremities. Sensation is intact. Strength 5/5 upper and lower extremities.  PSYCHIATRIC: Mood and affect are within normal limits.   LABORATORY, DIAGNOSTICS AND IMAGING DATA: Lactic acid is 1. Chest x-ray shows left base infiltrate.   Sodium 130, potassium 3.5, chloride 98, bicarb 27, BUN 22, creatinine 1.86,  glucose 120.   LFTs: Alk phos is slightly up at 159. Other LFTs are within normal limits.  GFR is 39.  WBC 13.8, hemoglobin 14.1, hematocrit 41.1, platelets 235. Troponin less than 0.02. INR is 1.1.   EKG shows normal sinus rhythm with 61 beats per minute. T wave inversions in V1, V2,  V3 and also in V4, V5, V6.  The patient's inversions in lateral leads, in V5, V6, are new compared to the EKG done in May of last year.   ASSESSMENT AND PLAN:  The patient is a 59 year old male who comes in with:  1. Generalized weakness, cough, poor p.o. intake, nausea and also fever with evidence of pneumonia on x-ray. The patient failed outpatient therapy. Admit him to hospitalist service for community-acquired pneumonia, start him on Rocephin and Zithromax, along with inhalers. Continue oxygen 2.5 to use at home.  2.  Hypotension. The patient is persistently hypotensive. After 2 liters of fluid, blood pressure still is 90s/60s. Does not look like he is septic; it is just that he has been taking Lasix, metolazone and also not eating for 2 weeks. At this time, continue fluids, hold Lasix, ACE inhibitors and also diuretics including Lasix, metolazone and also hold the Coreg and continue fluids and monitor the blood pressure.  3.   Bradycardia. The patient denies dizziness or chest pain. Initial heart rate is in 60s, but during my visit, heart rate dropped to around 37 to 40. At this time, hold the Coreg, monitor the heart rate on telemetry. If the patient becomes symptomatic, the patient needs cardiology evaluation for possible pacemaker, and  hopefully, the heart rate should improve without beta blocker and see how he does.  4.  Chronic kidney disease, stage III. Baseline creatinine is around 1.5. We are already giving fluids. Monitor met-B tomorrow and also the patient has acute on chronic renal failure. Continue fluids and monitor creatinine tomorrow. Hold metolazone and also Lasix and enalapril.  5.  Diabetes mellitus type 2. The patient is on Novolin at high-dose, 100 units in the morning and 100 at night. The patient also is on sliding scale. Continue them.  6.  Chronic diastolic heart failure, stable. Hold Lasix and also the diuretics because of his hypotension.  7.  Sleep apnea. Continue oxygen. The  patient says he is supposed to use CPAP but not using it. I will order CPAP.  8.  History of possible clotting problem. The patient's medications do not include Coumadin.  Coumadin was stopped when he was admitted in May of last year because of hemoptysis. The patient probably needs V/Q scan and also DVT study to see if he needs to go back on Coumadin. I am going to order lower extremity ultrasound, and the patient needs V/Q scan as well.   TIME SPENT: About 65 minutes on this.   Discussed the plan with the wife.    ____________________________ Epifanio Lesches, MD sk:dmm D: 10/30/2013 11:37:35 ET T: 10/30/2013 12:12:33 ET JOB#: [id number]  cc: Epifanio Lesches, MD, <Dictator> Epifanio Lesches MD ELECTRONICALLY SIGNED 10/31/2013 18:37

## 2015-01-12 NOTE — Discharge Summary (Signed)
PATIENT NAME:  Tyler Tyler [name], Tyler Tyler [name] MR#:  [id number] DATE OF BIRTH:  Mar 31, 1956  DATE OF ADMISSION:  10/30/2013 DATE OF DISCHARGE:  11/01/2013  PRESENTING COMPLAINT: Shortness of breath and low-grade fever along with generalized weakness.   DISCHARGE DIAGNOSES: 1.  Generalized weakness, improved.  3.  Ongoing treatment for pneumonia.  4.  Chronic leg edema.  5.  Hypertension.  6.  Type 2 diabetes.  7.  Morbid obesity.  CODE STATUS: FULL code.  HOME OXYGEN: Continue home oxygen 2 liters nasal cannula as before.   DISCHARGE MEDICATIONS: 1.  Pravastatin 40 mg daily.  2.  Levothyroxine 50 mcg p.o. daily.  3.  Metolazone 2.5 mg daily.  4.  Novolin N 100 units b.i.d.  5.  Enalapril 10 mg daily.  6.  Novolin R per sliding scale.  7.  Fluoxetine 20 mg p.o. daily.  8.  Cyanocobalamin 1000 mcg injectable once Tyler [name]  9.  Vitamin B 12,000 mcg 2 tablets twice Tyler [name] day.  10.  Lasix 80 mg in the morning.  11.  Carvedilol 6.25 mg b.i.d.  12.  Keflex 500 mg p.o. b.i.d.  13.  Azithromycin 250 mg p.o. daily.   DISCHARGE DIET: Carbohydrate-controlled.  DISCHARGE FOLLOWUP: With Dr. Lacie ScottsNiemeyer in 1 to 2 weeks.   LABORATORY AND DIAGNOSTICS: Creatinine at discharge was 1.53. The patient's creatinine on admission was 1.86. Sodium was 130. White count was 13.8. Blood cultures remained negative. Cardiac enzymes remained negative.   Chest x-ray showed there is mild patchy atelectasis and infiltrate, left base.   UA negative for UTI.  Ultrasound of lower extremity bilateral: Negative for DVT.   BRIEF SUMMARY OF HOSPITAL COURSE: Tyler Tyler [name] is Tyler [name] obese Caucasian gentleman with history of PE and DVT who was on Coumadin in the past now off of it given history of hemoptysis who comes in with:  1.  Generalized weakness, cough, poor p.o. intake, nausea, and fever. The patient did recently take outpatient antibiotic treatment, however, he was started on Rocephin and Zithromax. That was  changed to p.o. Keflex and Zithromax at discharge. His oxygen was continued. His white count was stable. The patient felt better and will finish up the course as outpatient.  2.  Hypotension due to mild dehydration in the setting of medications, poor p.o. intake. IV fluids were given. Blood pressure was stable. The patient's Lasix, ACE inhibitors, and Coreg were resumed 3.  Bradycardia. Remained asymptomatic. The patient was resumed back on his Coreg.  4.  CKD stage III. Creatinine baseline is around 1.5. The patient was 1.53 at discharge.  5.  Type 2 diabetes. His high dose of Novolin N and sliding scale were resumed. 6.  Chronic diastolic heart failure. Remained compensated and stable.  7.  Sleep apnea. The patient was supposed to use CPAP, not using it due to compliance issues. He was encouraged to do so. 8.  History of DVT and PE in the past. The patient's ultrasound Dopplers were negative bilaterally. Hence, there is no indication for any anticoagulation at this time.   Hospital stay otherwise remained stable. The patient remained Tyler [name]. ____________________________ Tyler Tyler [name]. Allena KatzPatel, MD sap:sb D: 11/02/2013 12:55:41 ET T: 11/02/2013 13:25:41 ET JOB#: [id number]  cc: Tyler Tyler [name]. Allena KatzPatel, MD, <Dictator> Tyler Tyler [name]. Lacie ScottsNiemeyer, MD Tyler Tyler [name] Akshay Spang MD ELECTRONICALLY SIGNED 11/09/2013 17:07

## 2015-01-12 NOTE — Consult Note (Signed)
Brief Consult Note: Diagnosis: PE; history of recurrent hemoptysis; symptomatic abdominal wal hernia.   Patient was seen by consultant.   Recommend to proceed with surgery or procedure.   Comments: given the large PE identified and the history of intolerance to anticoagulation in conjunction with the hernia and the possible need for repair I believe a filter is warranted  risks and benefits as well as the alternatives were discussed.  all qustions were answered.  He agree to proceed with IVC filter placement.  Electronic Signatures: Levora DredgeSchnier, Raeya Merritts (MD)  (Signed 09-Sep-15 16:05)  Authored: Brief Consult Note   Last Updated: 09-Sep-15 16:05 by Levora DredgeSchnier, Rihan Schueler (MD)

## 2015-01-12 NOTE — Discharge Summary (Signed)
PATIENT NAME:  Tyler Tyler [name], Tyler Tyler [name] MR#:  [id number] DATE OF BIRTH:  1955/11/24  DATE OF ADMISSION:  05/30/2014 DATE OF DISCHARGE:  05/31/2014  PRESENTING COMPLAINT: Left-sided pleuritic chest pain.   HISTORY OF PRESENT ILLNESS: Tyler Tyler [name] is Tyler [name] morbidly obese Caucasian gentleman with past medical history of clotting disorder with PE and DVT, CHF, depression, comes to the Emergency Room after he started having left-sided pleuritic chest pain with some shortness of breath. He was admitted with:   1. Acute recurrent left-sided PE left lower lobe. The patient has history of factor deficiency in the past, has history of DVT and PE, has been off Coumadin for about Tyler [name] due to issues with hemoptysis and GI bleed in the past. He was started on IV heparin drip. Vascular surgery consultation was obtained with Dr. Gilda CreaseSchnier given his recurrent PEs and an IVC filter was placed. The patient was switched over to p.o. Coumadin and Lovenox, he is recommended to take it until his INR is above 2. He is also recommended to continue his anticoagulation which is supposed to be lifelong given his history of factor deficiency and clotting disorder. The patient has voice understanding the need of anticoagulation and the risk of bleeding.  The patient's primary care physician is nurse practitioner Tyler Tyler [name] at Dr. Carron Tyler [name] office, I have made several attempts to call their office, however did not get any response and Dr. Carron Tyler [name] pager is out of service, hence unable to contact any physician regarding the patient being discharged on Coumadin. The patient however is aware and will get his PT/INR checked early next week.   2. Abdominal pain, resolved.  3. Type 2 diabetes. Continued insulin at home.  4. COPD with chronic home oxygen 2 liters nasal cannula.  5. Chronic leg edema with venous stasis.  6. History of obstructive sleep apnea on oxygen.   VASCULAR CONSULTATION: Dr. Gilda CreaseSchnier.     PROCEDURE:  Placement  of IVC filter on September 9.   DISCHARGE MEDICATIONS:  1. Coreg 6.25 daily.  2. Cyanocobalamin 2000 mcg p.o. b.i.d.  3. Bentyl 20 mg q. 8 p.r.n.  4. Docusate 100 mg b.i.d.  5. Lasix 80 mg b.i.d.  6. NovoLog 70.  7. Novolin N 100 units b.i.d.  8. Metolazone 2.5 mg daily.  9. Pravastatin 40 mg daily.  10. Novolin R sliding scale.  11. Levothyroxine 100 mcg p.o. daily.  12. Paxil 20 mg daily.  13. Losartan 25 mg daily.  14. Bentyl 10 mg 2 capsules ordered 3 times Tyler [name] day.  15. Warfarin 10 mg daily at 11:00.  16. Lovenox 160 mg b.i.d. stop once INR is above 2.0.   HOME OXYGEN: 2 liters nasal cannula.   FOLLOWUP: With Tyler Tyler [name], nurse practitioner with Dr. Carron Tyler [name] office next week, Monday September 14 for PT/INR check.   TIME SPENT: 40 minutes.    ____________________________ Tyler Tyler [name]. Tyler KatzPatel, MD sap:bu D: 05/31/2014 13:56:57 ET T: 05/31/2014 15:59:54 ET JOB#: [id number]  cc: Tyler Tyler [name]. Tyler KatzPatel, MD, <Dictator> Tyler Tyler [name]. Tyler ScottsNiemeyer, MD Tyler DillsGregory G. Schnier, MD Tyler Tyler [name] Tyler Sutherland MD ELECTRONICALLY SIGNED 06/11/2014 10:41

## 2015-01-13 NOTE — H&P (Signed)
PATIENT NAME:  Tyler [name], GEARHART MR#:  [id number] DATE OF BIRTH:  [birth year]  DATE OF ADMISSION:  01/11/2012  PRIMARY CARE PHYSICIAN:  Dr. Lacey Jensen, [facility].   CHIEF COMPLAINT: Chest pain, left substernal.   HISTORY OF PRESENT ILLNESS: Tyler [name] is a 59 year old morbidly obese Caucasian gentleman well known to our service from previous many admissions, has history of depression, obesity, chronic back pain, chronic respiratory failure, on home oxygen, with history of pulmonary embolus and deep vein thrombosis on Coumadin, comes into the Emergency Room with complaints of chest pain on and off left substernal, reports as pricking-like sharp with needles intermittent since Friday. The patient also noticed some minimal increasing shortness of breath. In the Emergency Room he is hemodynamically stable although systolic blood pressure is 129/78. The patient currently is chest pain free. His first set of cardiac enzymes is negative. EKG does not show any acute changes. The patient's chest x-ray showed mild pulmonary vascular congestion. The patient reports he feels he feels the same as far as shortness of breath and he has severe leg swelling which is chronic and per the patient it is about the same. He is being admitted for further evaluation on his chest pain and mild congestive heart failure.   PAST MEDICAL HISTORY:  1. Recurrent hemoptysis with unclear etiology. Underwent bronchoscopy and complete evaluation at [facility].  2. Type 2 diabetes on insulin with history of noncompliance with diet.  3. Severely obese.  4. Chronic leg edema, 3+ edema of both lower extremities.  5. CKD stage 3.  6. Pulmonary embolus on home oxygen.  7. Chronic respiratory failure due to chronic obstructive pulmonary and history of pulmonary embolus on home oxygen 2.5 liters per minute.  8. Hypothyroidism.  9. Obstructive sleep apnea.  10. Chronic back pain.  11. Hyperlipidemia.  12. Bilateral lower extremity deep venous  thrombosis.   ALLERGIES: Lipitor, lovastatin.   MEDICATIONS:  1. Carvedilol 6.25 p.o. daily.  2. Enalapril 10 mg daily.  3. Lasix 80 mg in the morning and 40 mg in the evening.  4. Humulin R 30 units 3 times a day.  5. Metformin 850 mg 3 times a day.  6. Metolazone 2.5 mg p.o. daily.  7. Novolin-N 100 units b.i.d.  8. Paxil 20 mg daily.  9. PEG 3350 oral powder for reconstitution daily for constipation.  10. Pravastatin 40 mg daily.  11. Vitamin B12 1000 mcg 2 tablets once a day.  12. Vitamin B12 1000 mcg injectable once a month.  13. Warfarin 5 mg 2 tablets daily at bedtime, which comes to 10 mg daily.   SOCIAL HISTORY: He lives at home with his wife. Currently, he smokes cigars. Occasional alcohol. He lives with his wife.   PAST SURGICAL HISTORY:  1. Vein stripping in left leg.  2. Gallbladder surgery.  3. Normal coronary cardiac catheterization 2003 at [facility]. Per patient he had another cath at [facility] for several years ago and he was told it was okay.   FAMILY HISTORY: Brothers and sisters have diabetes.   REVIEW OF SYSTEMS: CONSTITUTIONAL: No fever, fatigue, weakness. EYES: No blurred or double vision. ENT: No tinnitus, ear pain, hearing loss. RESPIRATORY: Positive for cough and shortness of breath which is chronic. CARDIOVASCULAR: Positive for chest pain and chronic leg edema. Positive for dyspnea on exertion which is chronic. GASTROINTESTINAL: No nausea, vomiting, diarrhea, or abdominal pain. No gastroesophageal reflux disease. GU: No dysuria or hematuria. ENDOCRINE: No polyuria, nocturia, or thyroid problems. HEMATOLOGY:  Positive for chronic anemia. SKIN: No acne or rash. MUSCULOSKELETAL: Positive for arthritis. NEUROLOGIC: No cerebrovascular accident or transient ischemic attack. PSYCH: No anxiety or depression. All other systems reviewed and negative.   PHYSICAL EXAMINATION:  GENERAL: The patient is morbidly obese.   VITAL SIGNS: Temperature 96.8,  pulse 78, blood pressure 129/78, sats are 92% on 2.5 liters nasal cannula oxygen.   HEENT: Atraumatic, normocephalic. Pupils are equal, round, and reactive to light and accommodation. Extraocular movements intact. Oral mucosa is moist.   NECK: Supple. No JVD. No carotid bruit.   LUNGS: Clear to auscultation bilaterally. Decreased breath sounds at the bases. No murmur is heard. PMI is not lateralized. Chest nontender.   EXTREMITIES: 3+ pitting edema bilaterally with chronic venous stasis changes present. No pedal pulses able to palpate because of edema. Good femoral pulses. Again, with body habitus femoral pulses were barely palpable.   ABDOMEN: Obese, soft, nontender. No organomegaly. Positive bowel sounds.   NEUROLOGIC: Grossly intact cranial nerves II through XII. No motor or sensory deficits.   PSYCH: The patient is awake, alert, and oriented x3.   LABORATORY, RADIOLOGICAL AND DIAGNOSTIC DATA: Lung V/Q scan shows abnormal ventilation perfusion lung scan intermediate probability for PE. CT correlation is recommended with contrast if patient can tolerate. If not, may be beneficial to obtain noncontrast CT chest to evaluate underlying lung disease. Bilateral lower extremity Doppler exam could also be considered. The patient underwent Doppler of the right lower extremity on 06/17/2011 which showed no evidence of deep venous thrombosis. Urinalysis negative for urinary tract infection. PT-INR 17.1 and 1.4. Cardiac enzymes, first set, negative. Hemoglobin 12.6 and hematocrit 39.3. CBC within normal limits except hemoglobin and hematocrit of 12.6 and 39.3. Comprehensive metabolic panel within normal limits except glucose of 187, BUN of 19, creatinine 1.41, sodium 134, potassium 3.8, chloride 98, bicarbonate 27, calcium 8.6. SGOT 22, SGPT 7.4. Albumin 3.3. Chest x-ray is consistent with mild pulmonary vascular congestion.   ASSESSMENT: 59 year old Mr. [name] with:  1. Chest pain. The patient  presented with sharp, intermittent, left sternal area chest pain since Friday. He is currently chest pain free. His cardiac enzymes are negative. No acute EKG changes are present. This appears atypical. The patient does have an abnormal lung V/Q scan with a subtherapeutic INR. This could be possible recurrent PE, however, cannot do a CT scan of the chest since his creatinine is elevated. The patient received a dose of Lovenox 175 mg subcutaneous and an extra dose of Coumadin as well. The patient had a catheterization here in 2003 which was negative as far as the patient remembers. He had a cath at [facility] several years ago and that was negative as well.  2. Deep venous thrombosis and PE on Coumadin with subtherapeutic INR and abnormal/indeterminate lung V/Q scan for PE.  3. Chronic leg edema.  4. Chronic respiratory failure, on oxygen 2.5 liters per minute with mild pulmonary vascular congestion. Will give one Lasix for today and then change back to oral Lasix.  5. Hypertension.  6. Type 2 diabetes, on insulin with noncompliance to diet.  7. Morbid obesity.   PLAN:  1. Admit the patient to telemetry floor.  2. Two gram 1,800 calorie diet.  3. I will give Lasix IV for today, change to p.o. tomorrow.  4. Monitor his ins and outs and Met B closely.  5. Continue all home medications except I will hold off on metformin since the patient's creatinine is up. Continue both Novolin-N and Humulin-R along  with sliding scale and glipizide. Hold metformin.  6. We will do cardiology consultation for chest pain.  7. Cycle cardiac enzymes x3.  8. Continue Coreg, ACE inhibitors, enalapril, and pravastatin.  9. I will also start the patient on therapeutic doses of Lovenox until his INR is therapeutic.  10. Further work-up according to the patient's clinical course. The hospital admission plan was discussed with the patient and the patient's wife, who is agreeable to it.   TIME SPENT: 50  minutes.  ____________________________ Hart Rochester Posey Pronto, MD sap:ap D: 01/11/2012 13:06:52 ET T: 01/11/2012 13:35:01 ET JOB#: [id number] cc: Zariah Cavendish A. Posey Pronto, MD, <Dictator> Dr. Lacey Jensen, [facility] Ilda Basset MD ELECTRONICALLY SIGNED 01/11/2012 17:27

## 2015-01-13 NOTE — Consult Note (Signed)
PATIENT NAME:  Tyler [name], [name] MR#:  [id number] DATE OF BIRTH:  11/05/1955  DATE OF CONSULTATION:  01/11/2012  REFERRING PHYSICIAN:   CONSULTING PHYSICIAN:  Lamar BlinksBruce J. Kowalski, MD  PRIMARY CARE PHYSICIAN:  Dr. Ellis SavageKiser.   REASON FOR CONSULTATION: Unstable angina, heart failure, diabetes, hypertension, hyperlipidemia and a history of atrial fibrillation.   CHIEF COMPLAINT:  I have got a little short of breath with needle pains.   HISTORY OF PRESENT ILLNESS: This is a 59 year old male with known cardiovascular disease with a cardiac catheterization several years prior showing no evidence of significant stenosis and no further intervention was necessary. The patient does have apparently some diastolic dysfunction, congestive heart failure multiple times and now has significant chronic lower extremity edema with 2+ edema. This may be secondary to chronic pulmonary disease with multiple previous pulmonary embolism and hypoxia. The patient has had multiple pulmonary embolisms and is on chronic Coumadin use for which she has had a reasonable INR. The patient has also some hypertension on appropriate medications including ACE inhibitor, tolerating the medication well. Diabetes mellitus is controlled and he has hyperlipidemia with Pravastatin. He has a history of atrial fibrillation but currently has a really well controlled heart rate and no evidence of significant issues. He is short of breath with new onset of left upper chest discomfort, stinging and burning radiating into the back off and on over the last several days with some weight gain. There is no current evidence of acute myocardial infarction. The patient now is comfortable at this time on oxygen.   REVIEW OF SYSTEMS:  The remainder of review of systems negative for vision change, ringing in the ears, hearing loss, cough, congestion, heartburn, nausea, vomiting, diarrhea, bloody stools, stomach pain, extremity pain, leg weakness, cramping of the  buttocks, known blood clots. No recent additional blood clots, headaches, blackouts, dizzy spells, nosebleed, congestion, trouble swallowing, frequent urination, urination at night, muscle weakness, numbness, anxiety, depression, skin lesions, or skin rashes.   PAST MEDICAL HISTORY:  1. Heart failure.  2. Coronary artery disease.  3. Hypertension.  4. Diabetes mellitus.  5. Hyperlipidemia.  6. SVTs.  7. Pulmonary embolism.   FAMILY HISTORY: No family members with early onset of cardiovascular disease.   SOCIAL HISTORY: The patient does smoke a cigarette off and on and denies alcohol use.   ALLERGIES: He has no known drug allergies.   CURRENT MEDICATIONS: As listed.   PHYSICAL EXAMINATION:  VITAL SIGNS: Blood pressure 136/68 bilaterally, heart rate 70 upright, reclining, and irregular.   GENERAL: He is a well-appearing large male in no acute distress.   HEENT: No icterus, thyromegaly, ulcers, hemorrhage, or xanthelasma.   HEART: Regular rate and rhythm with normal S1 and S2. Distant sounds. Point of maximal impulse is diffuse. Carotid upstroke normal without bruit. Jugular venous pressure is normal.   LUNGS: Lungs have few basilar crackles and distant sounds.   ABDOMEN: Soft, nontender, without hepatosplenomegaly or masses although at increased abdominal girth difficult to assess.   EXTREMITIES: 2+ radial. Cannot assess femoral or dorsal pedal pulses with 2+ lower extremity edema. No ulcers.   NEUROLOGIC: He is oriented to time, place, and person with normal mood and affect.   ASSESSMENT: 59 year old male with diastolic dysfunction, congestive heart failure, diabetes mellitus, hypertension, hyperlipidemia, pulmonary embolism, history of supraventricular tachycardia with significant 2+ lower extremity edema consistent with chronic pulmonary embolism and pulmonary hypertension having stinging, burning chest discomfort.   RECOMMENDATIONS:  1. Serial ECG and enzymes to assess for  possible myocardial infarction and other rhythm disturbances causing current symptoms.  2. Continue antihypertensive medications including ACE inhibitor without change today for a goal systolic blood pressure below 409 mm.  3. Echocardiogram for pulmonary embolism, right heart failure, chronic pulmonary hypertension and other valvular heart disease causing current symptoms.  4. Continue anticoagulation for reduction of pulmonary embolus. 5. No cardiac diagnostics including cardiac catheterization unless the patient has significant elevation of troponin consistent with true myocardial infarction. 6. Diabetes mellitus with medication management changes today for a goal hemoglobin A1c below 7. 7. Continue chronic oxygen therapy for significant pulmonary hypertension.  8. Continue repeat CT scan if necessary for pulmonary embolism causing previous supraventricular tachycardia.   ____________________________ Lamar Blinks, MD bjk:ap D: 01/11/2012 14:27:23 ET T: 01/11/2012 15:18:20 ET JOB#: [id number]  cc: Lamar Blinks, MD, <Dictator> Lamar Blinks MD ELECTRONICALLY SIGNED 01/12/2012 7:58

## 2015-01-13 NOTE — Discharge Summary (Signed)
PATIENT NAME:  Tyler [name], Tyler [name] MR#:  [id number] DATE OF BIRTH:  1955-11-08  DATE OF ADMISSION:  01/11/2012 DATE OF DISCHARGE:  01/12/2012  PRIMARY CARE PHYSICIAN: Tyler DecemberJohn Kiser, [facility]   CARDIOLOGIST: Arnoldo HookerBruce Kowalski, MD   DISCHARGE DIAGNOSES:  1. Acute on chronic respiratory failure requiring home oxygen.  2. Acute congestive heart failure, diastolic.  3. History of pulmonary embolism.  4. Hypertension.  5. Obesity.  6. Diabetes.  7. Hypothyroidism.  8. Hyperlipidemia.  9. Chronic kidney disease, stage 3.   DISCHARGE MEDICATIONS:  1. Pravastatin 40 mg daily.  2. Vitamin B12 injection 1 mL every month.  3. Carvedilol 6.25 mg, he takes only daily.  4. Glipizide 10 mg daily.  5. Extended-release levothyroxine 50 mcg daily.  6. Metolazone 2.5 mg daily.  7. Paxil 20 mg daily.  8. PEG-3350 oral powder for reconstitution, 17 to 34 grams with 8 ounces of fluid daily. 9. Humulin R 30 units t.i.d. before breakfast, lunch and supper.  10. Enalapril 10 mg daily.  11. Furosemide 80 mg in the morning and 40 mg in the evening.  12. Vitamin B12 1000 mcg, 2 tablets daily.  13. Novolin N 100 units subcutaneous injection twice [name] day. 14. I asked him to increase his warfarin to 12.5 mg p.o. nightly for the next 2 days, then back to 10 mg p.o. nightly.  15. Lovenox, [name] total of 175 mg subcutaneous injection b.i.d. for 3 days.  NOTE: Do not take metformin.  OXYGEN: Via nasal cannula at 2.5 liters.   DIET: Low sodium, 1800 ADA diet.   ACTIVITY: Activity as tolerated.   FOLLOWUP:  1. Follow up with Dr. Gwen PoundsKowalski in 1 week.  2. Follow up in 1 to 2 weeks with Dr. Victorino DecemberJohn [name]. Need [name] follow-up Coumadin level either on Thursday or Friday with Dr. Victorino DecemberJohn [name].   HOSPITAL LABORATORY, DIAGNOSTIC AND RADIOLOGICAL DATA:  EKG showed normal sinus rhythm with sinus arrhythmia, right bundle branch block, low voltage, Q waves septally. Chest x-ray showed pulmonary vascular prominence.  Glucose 187, BUN  19, creatinine 1.41, sodium 134, potassium 3.8, chloride 98, CO2 27, calcium 8.6, alkaline phosphatase 179, ALT 15, AST 22, GFR 55.  Troponin negative.  White blood cell count 9.5, hemoglobin and hematocrit 12.6 and 39.3, platelet count 239.  INR was 1.4.  Urinalysis negative.  VQ scan showed abnormal ventilation perfusion lung scan, intermediate probability of pulmonary embolism. Consider CT correlation.  Echocardiogram showed ejection fraction of 50%, mild mitral regurgitation. Ultrasound of the lower extremities bilaterally showed no evidence of deep vein thrombosis.  Troponins remained negative.  Repeat INR on the 23rd was 1.6. Repeat creatinine on the 23rd was 1.41.   REASON FOR ADMISSION: The patient was admitted as an observation on 01/11/2012 and discharged 01/12/2012, came in with chest pain, left-sided and substernal.   HOSPITAL COURSE: The patient is [name] obese man with previous admissions, history of depression, obesity, chronic back pain, chronic respiratory failure, pulmonary embolism, deep vein thrombosis on Coumadin, came to the Emergency Room with chest pain on and off and also with increased shortness of breath. He was admitted with chest pain. He had an abnormal V/Q scan with [name] subtherapeutic INR and was started on high dose Lovenox and increased Coumadin. Cardiology consultation was done. He was started on IV Lasix for congestive heart failure and his acute on chronic respiratory failure. His metformin was held. The patient was seen in consultation by Dr. Gwen PoundsKowalski on the 22nd, and on the  morning of the 23rd Dr. Gwen Pounds cleared him to go home from [name] cardiac standpoint with follow-up next week. When I saw the patient in the afternoon on the 23rd, he did want to go home.   PROBLEM LIST:  1. Acute on chronic respiratory failure: He is back on his chronic 2.5 liters of nasal cannula on which he was kept.  2. Acute congestive heart failure, diastolic in nature: He was put on  IV Lasix. Lungs were clear at the time of discharge. He will be put back on his usual oral Lasix, and he is also on metolazone. He is on Coreg and enalapril.  3. Hypertension: Blood pressure is currently stable at 120/77 on his current medications.  4. Obesity: Weight loss recommended.  5. Diabetes: He is on high-dose insulin and glipizide. I recommended stopping the Glucophage, with [name] creatinine of 1.4.  6. Hypothyroidism: He is on levothyroxine.  7. Hyperlipidemia: He is on pravastatin.  8. Chronic kidney disease, stage 3: Creatinine was stable here during the hospital course. He needs followup as an outpatient.   TIME SPENT ON DISCHARGE: 35 minutes.   ____________________________ Herschell Dimes. Renae Gloss, MD rjw:cbb D: 01/12/2012 16:56:51 ET T: 01/13/2012 10:26:51 ET JOB#: [id number]  cc: Herschell Dimes. Renae Gloss, MD, <Dictator> Lamar Blinks, MD Tyler December, MD at [facility]  Salley Scarlet MD ELECTRONICALLY SIGNED 01/14/2012 17:31

## 2015-01-20 NOTE — H&P (Signed)
PATIENT NAME:  Tyler [name], Tyler [name] MR#:  [id number] DATE OF BIRTH:  [birth year]  DATE OF ADMISSION:  10/30/2014  ADMITTING PHYSICIAN:  Enid Baas, MD  PRIMARY CARE PHYSICIAN:  Neomia Dear. Harrington Challenger, MD  PRIMARY CARDIOLOGIST: Lamar Blinks, MD    CHIEF COMPLAINT: Difficulty breathing and worsening edema.   HISTORY OF PRESENT ILLNESS: Tyler [name] is a 59 year old morbidly obese Caucasian male with past medical history significant for chronic respiratory failure secondary to COPD on 2.5 liters home oxygen, chronic diastolic congestive heart failure, sleep apnea, not tolerating CPAP, peripheral neuropathy, diabetes mellitus, hypertension, hypothyroidism, chronic back pain and spinal stenosis, and CKD stage 3, presents to the hospital from home secondary to worsening edema of his lower extremities, difficulty breathing, not responsive to outpatient changes to his diuretics. Most of the history is obtained from his wife, who takes cares of him and also manages his medications. According to her, over the past 2 months his pedal edema and abdominal wall fluid buildup has been worsening.  He almost put on 30-33 pounds of weight in the last 2-3 months. He was on Lasix 80 b.i.d. in the past and because of the weight gain about 3-4 weeks ago, his PCP had changed his medication diuretic to torsemide 40 mg b.i.d., in spite of which, he continued to put on weight and actually had about 10 pounds in the last one week. He says his urine output has decreased. He is using the bathroom only at least once a day and his urine is dark-colored. He does not feel dehydrated. He is getting easily dyspneic at this time. He has a walker and also wheelchair and most of his ambulation is limited secondary to spinal stenosis and chronic back pain.   His chest x-ray in the ER shows pulmonary vascular congestion, so he is being admitted for CHF exacerbation, not responsive to outpatient diuresis.   PAST MEDICAL HISTORY: 1.   History of DVT.   2.  Multiple pulmonary emboli.  3.  Essential hypertension.  4.  Depression.  5.  Hyperlipidemia.  6.  Diabetes mellitus with complications.  7.  Chronic kidney disease, stage 3. 8.  Vitamin D deficiency.  9.  Vitamin B12 deficiency.  10.  Hypothyroidism.  11.  Chronic low back pain.  12.  Spinal stenosis.  13.  Obesity.  14.  Peripheral neuropathy.  15.  Obstructive sleep apnea.  16.  COPD on 2.5 liters home oxygen.  17.  Osteoarthritis.  18.  Pulmonary hypertension.  19.  Chronic diastolic CHF.  PAST SURGICAL HISTORY: 1.  Cholecystectomy.  2.  Testicular removal on 1 side due to benign mass. 3.  History of venous stripping from his legs for varicose veins.  4.  IVC filter placement.   ALLERGIES TO MEDICATIONS: LIPITOR, LOVASTATIN.  CURRENT HOME MEDICATIONS: Include: 1.  Coreg 6.25 mg p.o. b.i.d.  2.  Cyanocobalamin 1000 mcg sublingual 2 tablets once a day.  3.  Cyanocobalamin 1000 mcg per mL injection 1 mL every month.  4.  Gabapentin 100 mg p.o. b.i.d.  5.  Insulin NPH 90 units b.i.d. a.c.  6.  Regular insulin 10 units prior to each meal if sugar is greater than 120.  7.  Levothyroxine 100 mcg p.o. daily.  8.  Losartan 25 mg p.o. daily.  9.  Nystatin powder twice a day as needed.  10.  Omeprazole 20 mg p.o. daily.  11.  Paroxetine 20 mg p.o. daily.  12.  Potassium chloride 10 mEq 2 tablets  daily.  13.  Pravastatin 40 mg p.o. daily.  14.  Spironolactone 25 mg p.o. daily.  15.  Torsemide 40 mg p.o. b.i.d.  16.  Warfarin 10 mg p.o. daily.   SOCIAL HISTORY: Lives at home with his wife, has a wheelchair at baseline, and also has a walker. Used to smoke cigars, occasionally still does it.  No cigarettes. No alcohol abuse.   FAMILY HISTORY: Significant for history of DVTs and PE in mother and also hypertension in the family.   REVIEW OF SYSTEMS:  CONSTITUTIONAL: No fever, positive for fatigue and weakness.  EYES: Positive for blurred vision,  cataracts, no glaucoma or inflammation.  EARS, NOSE, THROAT: No tinnitus. Positive for right ear pain and bleeding secondary to eardrum rupture which happened yesterday after a sneezing episode, but currently no discharge. No dysphagia.  RESPIRATORY: Positive for dyspnea, chronic cough. No wheezing. Positive for history of COPD, no hemoptysis.  CARDIOVASCULAR: Positive for chest tightness occasionally, positive for dyspnea on exertion, orthopnea. No palpitations or syncope. Positive for arrhythmia.  GASTROINTESTINAL: No nausea, vomiting, diarrhea, abdominal pain, positive for constipation. No jaundice or rectal bleeding.  GENITOURINARY: Decreased urination, complains of dysuria. No hematuria or incontinence.  ENDOCRINE: No polyuria, nocturia, thyroid problems, heat or cold intolerance.  HEMATOLOGY: No anemia, easy bruising or bleeding.  SKIN: No acne, rash or lesions.  MUSCULOSKELETAL: Chronic low back pain, and arthritis. No gout.  NEUROLOGIC: No numbness, weakness, CVA, TIA, or seizures.  PSYCHOLOGICAL: No anxiety, insomnia, or depression.   PHYSICAL EXAMINATION: VITAL SIGNS: Temperature 99.4 degrees Fahrenheit, pulse 52, respirations 20, blood pressure 90/52, pulse oximetry 93% on 2.5 liters oxygen.  GENERAL: Heavily-built, well-nourished male, lying in bed in mild respiratory distress, especially whenever talking.  HEENT: Normocephalic, atraumatic. Pupils equal, round, reacting to light. Anicteric sclerae, extraocular movements intact. Oropharynx is clear without erythema, mass, or exudates. Ears, the right eardrum is covered with blood, fresh blood oozing into the ear canal. Nothing outside the ear.  No tenderness noted. The left eardrum is clear, mild wax noted in the left auditory canal. NECK: Supple. No thyromegaly, JVD, or carotid bruits. No lymphadenopathy.   LUNGS: Moving air bilaterally, fine bibasilar crackles heard. No use of accessory muscles for breathing. No rhonchi or wheezing.   CARDIOVASCULAR: S1, S2, irregular rhythm and normal rate. Unable to hear any murmurs or rubs.  ABDOMEN: Obese, thickened abdominal wall from edema. Unable to palpate any liver or spleen, but normal bowel sounds.  EXTREMITIES: Edema 4+ extending all the way from the legs up to the abdominal wall and the lower back. Unable to palpate any pulses. No clubbing, cyanosis noted.  LYMPHATICS: No cervical lymphadenopathy.  NEUROLOGIC: Cranial nerves seem intact. No focal motor or sensory deficits.  PSYCHOLOGICAL: The patient is awake, alert, oriented x 3.   LABORATORY DATA:  CBC is pending at this time. Sodium 136, potassium 3.6, chloride 99, bicarbonate 32, BUN 25, creatinine 1.76, glucose of 42, and calcium of 8.3. ALT 8, AST 26, alkaline phosphatase 232, total bilirubin 0.8 and albumin of 2.4. Troponin is negative. BNP is elevated at 1797.   IMAGING:  Chest x-ray showing mild to moderate CHF findings. EKG showing atrial fibrillation, low heart rate. No acute ST-T wave abnormalities. Rate is 48.  ASSESSMENT AND PLAN: This is a 59 year old morbidly obese male with past medical history significant for chronic kidney disease, stage 3, diastolic congestive heart failure, chronic obstructive pulmonary disease on 2.5 liters home oxygen, deep venous thromboses, pulmonary embolus, hypertension, diabetes, peripheral  neuropathy, chronic back pain, admitted to the hospital secondary to worsening weight gain and dyspnea. Chest x-ray showing congestive heart failure. 1.  Acute on chronic diastolic congestive heart failure exacerbation with poor urine output as outpatient.  Continue telemetry,  started IV Lasix. Nephrology and cardiology consult, might need a Lasix drip, strict inputs and outputs and monitor on telemetry.  2.  Chronic kidney disease, stage 3. Creatinine stable at 1.7. Monitor while being diuresed.  3.  History of deep venous thromboses and pulmonary embolus, on Coumadin. Check INR and follow up in a.m.  The patient is status post inferior vena cava filter.  4.  Diabetes mellitus, insulin-dependent, low sugars today. Decrease the dose of his NPH insulin, continue sliding scale insulin.  5.  Obstructive sleep apnea and chronic respiratory failure secondary to chronic obstructive pulmonary disease. Continue home oxygen, 2.5 liters. The patient could not tolerate CPAP as an outpatient. Continue home inhalers if any. 6.  Eardrum rupture on the right side from a sneeze yesterday while on Coumadin. Continue his ofloxacin ear drops, symptoms are improving.  7.  Deep vein thrombosis prophylaxis, on Coumadin. Pharmacy to adjust the dose.  CODE STATUS: Full code.   Total time spent on admission of this patient is 50 minutes.    ____________________________ Enid Baasadhika Zayed Griffie, MD rk:LT D: 10/30/2014 17:55:00 ET T: 10/30/2014 18:28:52 ET JOB#: [id number]  cc: Enid Baasadhika Hairo Garraway, MD, <Dictator> Lamar BlinksBruce J. Kowalski, MD Dr. Antonietta BarcelonaSingh David N. Harrington Challengerhies, MD Enid BaasADHIKA Valene Villa MD ELECTRONICALLY SIGNED 12/06/2014 16:52

## 2015-01-20 NOTE — Consult Note (Signed)
   Present Illness 59 year old morbidly obese Caucasian male with past medical history significant for chronic respiratory failure secondary to COPD on 2.5 liters home oxygen, chronic diastolic congestive heart failure, sleep apnea, not compliant with  CPAP, peripheral neuropathy, diabetes mellitus, hypertension, hypothyroidism, chronic back pain and spinal stenosis, and CKD stage 3, presents to the hospital from home secondary to worsening edema of his lower extremities, difficulty breathing, not responsive to outpatient changes to his diuretics. He has noted progressive peripheral edema and minimal responsivemess with his diuretics. He was felt to have possible ascites but ultrasound today does not appear to reveal any signficant peritoneal fluid. he is a difficult histoarian but denies chest pain. His sob is at his baseline. Echo was of llimited use due to poor sound wave transmission but reveals what apears to be normal lv function.   Physical Exam:  GEN obese   HEENT PERRL   NECK supple   RESP no use of accessory muscles  rhonchi   CARD Regular rate and rhythm   ABD massive pannus   EXTR positive edema   SKIN positive rashes   NEURO cranial nerves intact, motor/sensory function intact   PSYCH poor insight   Review of Systems:  Subjective/Chief Complaint sob and massive abdominal girth and severe peripheral edema   General: Fatigue  Weakness   Skin: Rashes   ENT: No Complaints   Eyes: No Complaints   Neck: No Complaints   Respiratory: Short of breath   Cardiovascular: Dyspnea  Edema   Gastrointestinal: abdominal pain   Genitourinary: No Complaints   Vascular: Calf pain with walking   Musculoskeletal: Muscle or joint pain   Neurologic: No Complaints   Hematologic: No Complaints   Endocrine: No Complaints   Psychiatric: Depression   Review of Systems: All other systems were reviewed and found to be negative   Medications/Allergies Reviewed  Medications/Allergies reviewed   Family & Social History:  Family and Social History:  Social History negative tobacco   EKG:  EKG NSR    Lovastatin: Other  Lipitor: Unknown   Impression Pt with multiple medical problems including copd, sleep apnea not compliant with cpap, massive abdominal edema and peripheral edema with cxr showing mild to moderate chf. Pt does not have any signficant peritoneal fluid. Not responsive to conventional oral diuretics. Will need lasix drip as you are doing. Will need to conitnue with losartan, carvedilol and spironolatone. Await nephrology input. Appears to have both systollic and diastollic chf. Pulmonary hypertension is likely adding to his symptoms. Will need to retry bipap or cpap   Plan 1.Lasix drip 2. Conitnue carvedilol, losartan and spironolactone 3. Retry cpap 4. Nephrology evaluation  5. Will follow with you.   Electronic Signatures: Dalia HeadingFath, Dyneisha Murchison A (MD)  (Signed 10-Feb-16 21:39)  Authored: General Aspect/Present Illness, History and Physical Exam, Review of System, Family & Social History, EKG , Allergies, Impression/Plan   Last Updated: 10-Feb-16 21:39 by Dalia HeadingFath, Saylor Murry A (MD)

## 2015-01-20 NOTE — Consult Note (Signed)
General Aspect edema and apin from his IVC filter   Present Illness The patietn is a 59 year old morbidly obese male with past medical history significant for chronic respiratory failure secondary to COPD on 2.5 liters home oxygen, chronic diastolic congestive heart failure, sleep apnea, not tolerating CPAP, peripheral neuropathy, diabetes mellitus, hypertension, hypothyroidism, chronic back pain and spinal stenosis.  He also has CKD stage 3.  He presented to the hospital several days ago because of worsening edema of his lower extremities, difficulty breathing, not responsive to outpatient changes to his diuretics. He almost put on about 30 pounds of weight over the past 2-3 months. He was on Lasix 80 b.i.d. in the past and because of the weight gain about 3-4 weeks ago, his PCP had changed his medication diuretic to torsemide 40 mg b.i.d., in spite of which, he continued to put on weight and actually had about 10 pounds in the last one week. He says his urine output has decreased.   His chest x-ray in the ER shows pulmonary vascular congestion, so he was admitted for CHF exacerbation, not responsive to outpatient diuresis.   He also notes a nonspecific right lower quadrent pain whcih he feels is realted to his IVC filter.  He does have a history of PE and DVT remotely (he estimates about 10 years ago)  Recent ultrasound of his legs is negative for acute DVT or major chronic changes.  Ultrasound of the IVC was not successful.  He continues on his Coumadin.  PAST MEDICAL HISTORY: 1.  History of DVT.   2.  Multiple pulmonary emboli.  3.  Essential hypertension.  4.  Depression.  5.  Hyperlipidemia.  6.  Diabetes mellitus with complications.  7.  Chronic kidney disease, stage 3. 8.  Vitamin D deficiency.  9.  Vitamin B12 deficiency.  10.  Hypothyroidism.  11.  Chronic low back pain.  12.  Spinal stenosis.  13.  Obesity.  14.  Peripheral neuropathy.  15.  Obstructive sleep apnea.  16.  COPD on  2.5 liters home oxygen.  17.  Osteoarthritis.  18.  Pulmonary hypertension.  19.  Chronic diastolic CHF.  PAST SURGICAL HISTORY: 1.  Cholecystectomy.  2.  Testicular removal on 1 side due to benign mass. 3.  History of venous stripping from his legs for varicose veins.  4.  IVC filter placement.   Home Medications: Medication Instructions Status  ofloxacin otic 0.3% otic solution 5 drop(s) to each affected ear 2 times a day Active  pravastatin 40 mg oral tablet 1 tab(s) orally once a day Active  cyanocobalamin 1000 mcg/mL injectable solution 1 milliliter(s) injectable once a month Active  levothyroxine 100 mcg (0.1 mg) oral tablet 1 tab(s) orally once a day Active  PARoxetine 20 mg oral tablet 1 tab(s) orally once a day Active  losartan 25 mg oral tablet 1 tab(s) orally once a day Active  Novolin N 100 units/mL subcutaneous suspension 90 unit(s) subcutaneous 2 times a day Active  NovoLIN R human recombinant 100 units/mL injectable solution 10 unit(s) injectable 3 times a day (before meals), As Needed if blood sugar >120 and eating Active  cyanocobalamin 1000 mcg sublingual tablet 2 tab(s) sublingual once a day Active  gabapentin 100 mg oral capsule 1 cap(s) orally 2 times a day Active  nystatin topical 100000 units/g topical powder Apply topically to affected area 2 times a day Active  omeprazole 20 mg oral delayed release tablet 1 tab(s) orally once a day Active  potassium  chloride 10 mEq oral tablet, extended release 2 tab(s) orally once a day Active  spironolactone 25 mg oral tablet 1 tab(s) orally once a day Active  torsemide 20 mg oral tablet 1 tab(s) orally 2 times a day Active  carvedilol 6.25 mg oral tablet 1 tab(s) orally 2 times a day Active  PriLOSEC 20 mg oral delayed release tablet 1 tab(s) orally once a day Active  oxymetazoline nasal 0.05% nasal spray 2 spray(s) nasal 2 times a day Active  warfarin 5 mg oral tablet 1 tab(s) orally Monday, Wednesday, and Friday Active   warfarin 10 mg oral tablet 1 tab(s) orally 4 times a week (Tuesday, Thursday, Saturday, Sunday) Active    Lovastatin: Other  Lipitor: Unknown  Case History:  Family History Non-Contributory   Social History negative tobacco, negative ETOH, negative Illicit drugs, remote past smoker   Review of Systems:  ROS No TIA/stroke/seizure No heat or cold intolerance No dysuria/hematuria No blurry or double vision No tinnitus or ear pain No rashes or ulcer No suicidal ideation or psychosis No signs of bleeding or easy bruising No SOB/DOE, orthopnea, or sputum No palpitations or chest pain No N/V/D or abdominal pain No joint pain or joint swelling No fever or chills No unintentional weight loss or gain   Physical Exam:  GEN well developed, obese   HEENT hearing intact to voice, moist oral mucosa   NECK supple  trachea midline   RESP postive use of accessory muscles  heavy breathing   CARD regular rate  LE edema present   ABD denies tenderness  massive obesity   EXTR positive edema, massive edema noted bilaterally with severe venous stasis dermatitis.  No active ulcers at this tiem.   SKIN positive rashes, No ulcers, tight to palpation   NEURO cranial nerves intact, follows commands, motor/sensory function intact   PSYCH alert, A+O to time, place, person   Nursing/Ancillary Notes: **Vital Signs.:   13-Feb-16 05:03  Vital Signs Type Routine  Temperature Temperature (F) 98.6  Celsius 37  Temperature Source oral  Pulse Pulse 70  Respirations Respirations 18  Systolic BP Systolic BP 102  Diastolic BP (mmHg) Diastolic BP (mmHg) 78  Mean BP 93  Pulse Ox % Pulse Ox % 94  Pulse Ox Activity Level  At rest  Oxygen Delivery 2L   LabObservation:  10-Feb-16 13:15   OBSERVATION PACS Image [id number]  Routine Chem:  10-Feb-16 01:42   Glucose, Serum  60  BUN  24  Creatinine (comp)  1.84  Sodium, Serum 136  Potassium, Serum 3.7  Chloride, Serum 98   CO2, Serum  35  Calcium (Total), Serum 8.7  Anion Gap  3  Osmolality (calc) 274  eGFR (African American)  49  eGFR (Non-African American)  40 (eGFR values <45m/min/1.73 m2 may be an indication of chronic kidney disease (CKD). Calculated eGFR, using the MRDR Study equation, is useful in  patients with stable renal function. The eGFR calculation will not be reliable in acutely ill patients when serum creatinine is changing rapidly. It is not useful in patients on dialysis. The eGFR calculation may not be applicable to patients at the low and high extremes of body sizes, pregnant women, and vegetarians.)  13-Feb-16 05:20   Glucose, Serum  144  BUN  22  Creatinine (comp)  1.75  Sodium, Serum 140  Potassium, Serum 3.8  Chloride, Serum 101  CO2, Serum  34  Calcium (Total), Serum 9.1  Anion Gap  5  Osmolality (calc) 285  eGFR (African American)  52  eGFR (Non-African American)  43 (eGFR values <41m/min/1.73 m2 may be an indication of chronic kidney disease (CKD). Calculated eGFR, using the MRDR Study equation, is useful in  patients with stable renal function. The eGFR calculation will not be reliable in acutely ill patients when serum creatinine is changing rapidly. It is not useful in patients on dialysis. The eGFR calculation may not be applicable to patients at the low and high extremes of body sizes, pregnant women, and vegetarians.)  Cardiac:  10-Feb-16 01:42   CPK-MB, Serum 1.0 (Result(s) reported on 31 Oct 2014 at 02:16AM.)  Troponin I < 0.02 (0.00-0.05 0.05 ng/mL or less: NEGATIVE  Repeat testing in 3-6 hrs  if clinically indicated. >0.05 ng/mL: POTENTIAL  MYOCARDIAL INJURY. Repeat  testing in 3-6 hrs if  clinically indicated. NOTE: An increase or decrease  of 30% or more on serial  testing suggests a  clinically important change)  Routine Coag:  10-Feb-16 01:42   Prothrombin  30.6 (11.4-15.0 NOTE: New Reference Range  10/19/14)  INR 2.9 (INR reference  interval applies to patients on anticoagulant therapy. A single INR therapeutic range for coumarins is not optimal for all indications; however, the suggested range for most indications is 2.0 - 3.0. Exceptions to the INR Reference Range may include: Prosthetic heart valves, acute myocardial infarction, prevention of myocardial infarction, and combinations of aspirin and anticoagulant. The need for a higher or lower target INR must be assessed individually. Reference: The Pharmacology and Management of the Vitamin K  antagonists: the seventh ACCP Conference on Antithrombotic and Thrombolytic Therapy. CHYQMV.7846Sept:126 (3suppl): 2N9146842 A HCT value >55% may artifactually increase the PT.  In one study,  the increase was an average of 25%. Reference:  "Effect on Routine and Special Coagulation Testing Values of Citrate Anticoagulant Adjustment in Patients with High HCT Values." American Journal of Clinical Pathology 2006;126:400-405.)  13-Feb-16 05:20   Prothrombin  26.4 (11.4-15.0 NOTE: New Reference Range  10/19/14)  INR 2.4 (INR reference interval applies to patients on anticoagulant therapy. A single INR therapeutic range for coumarins is not optimal for all indications; however, the suggested range for most indications is 2.0 - 3.0. Exceptions to the INR Reference Range may include: Prosthetic heart valves, acute myocardial infarction, prevention of myocardial infarction, and combinations of aspirin and anticoagulant. The need for a higher or lower target INR must be assessed individually. Reference: The Pharmacology and Management of the Vitamin K  antagonists: the seventh ACCP Conference on Antithrombotic and Thrombolytic Therapy. CNGEXB.2841Sept:126 (3suppl): 2N9146842 A HCT value >55% may artifactually increase the PT.  In one study,  the increase was an average of 25%. Reference:  "Effect on Routine and Special Coagulation Testing Values of Citrate Anticoagulant  Adjustment in Patients with High HCT Values." American Journal of Clinical Pathology 2006;126:400-405.)  Routine Hem:  10-Feb-16 01:42   WBC (CBC) 5.7  RBC (CBC) 4.55  Hemoglobin (CBC)  12.4  Hematocrit (CBC)  39.4  Platelet Count (CBC) 201  MCV 87  MCH 27.3  MCHC  31.5  RDW  17.4  Neutrophil % 63.3  Lymphocyte % 22.5  Monocyte % 10.6  Eosinophil % 2.4  Basophil % 1.2  Neutrophil # 3.6  Lymphocyte # 1.3  Monocyte # 0.6  Eosinophil # 0.1  Basophil # 0.1 (Result(s) reported on 31 Oct 2014 at 02:06AM.)   Cardiology:    09-Feb-16 18:51, Echo Doppler  Echo Doppler   REASON FOR EXAM:  COMMENTS:       PROCEDURE: [facility] - ECHO DOPPLER COMPLETE(TRANSTHOR)  - Oct 30 2014  6:51PM     RESULT: Echocardiogram Report    Patient Name:   Tyler [name] Date of Exam: 10/30/2014  Medical Rec #:  [id number]              Custom1:  Date of Birth:  [birth year]          Height:       72.0 in  Patient Age:    18 years            Weight:       386.0 lb  Patient Gender: M                   BSA:          2.82 m??    Indications: CHF  Sonographer:    Arville Go RDCS  Referring Phys: Gladstone Lighter    Sonographer Comments: Technically challenging study due to less than   ideal echo windows, no apical window, no subcostal window, patient is   morbidly obese and Limited.    Summary:   1. Left ventricular ejection fraction, by visual estimation, is 55 to   60%.   2. Moderate pleural effusion in the left lateral region.  2D AND M-MODE MEASUREMENTS (normal ranges within parentheses):  Left Ventricle:          Normal  IVSd (2D):      1.05 cm (0.7-1.1)  LVPWd (2D):     1.07 cm (0.7-1.1) Aorta/LA:                  Normal  LVIDd (2D):     3.69 cm (3.4-5.7) Aortic Root (2D): 2.90 cm (2.4-3.7)  LVIDs (2D):     2.60 cm  LV FS (2D):     29.5 %   (>25%)  LV EF (2D):     57.4 %   (>50%)   Right Ventricle:             RVd (2D):  SPECTRAL DOPPLER ANALYSIS (where applicable):  Tricuspid Valve  and PA/RV Systolic Pressure: TR Max Velocity: 2.95 m/s RA   Pressure:  RVSP/PASP:    PHYSICIAN INTERPRETATION:  Left Ventricle: Not well seen. The left ventricular internal cavity size   was normal. LV posterior wall thickness was normal. Left ventricular   ejection fraction, by visual estimation, is 55 to 60%.  Right Atrium: The right atrium was not well visualized.  Pericardium: There is no evidence of pericardial effusion. There is a   moderate pleural effusion in the left lateral region.  Mitral Valve: The mitral valve is not well seen.  Tricuspid Valve: The tricuspid valve is not well seen.  Aortic Valve: The aortic valve was not well seen.    Blair MD  Electronically signed by [id number] Bartholome Bill MD  Signature Date/Time: 10/31/2014/8:00:24 AM  *** Final ***    IMPRESSION: .        Verified By: Teodoro Spray, M.D., MD    Impression 1.  Severe Lymphedema and Venous stasis dermatitis  He will need to begin compression and he has already been evaluating compression stockings.  I have also recommended he evaluate Farrow wraps or CirAids.  Ultimately he will definitely need a lymph pump.  lastly He is interested in removing his IVC filter which can be arranged when his pulmonary and renal status is more stable.  He will follow up with me as an out patient. 2.  Acute on chronic diastolic congestive heart failure exacerbation with poor urine output as outpatient.  On IV Lasix. Nephrology and cardiology have seen the patient, might need a Lasix drip, strict inputs and outputs and monitor on telemetry.  3.  History of deep venous thromboses and pulmonary embolus, on Coumadin. Check INR and follow up in a.m. The patient is status post inferior vena cava filter.  4.  Chronic kidney disease, stage 3. Creatinine stable at 1.7. Monitor while being diuresed.  5.  Obstructive sleep apnea and chronic respiratory failure secondary to chronic obstructive pulmonary disease. Continue home  oxygen, 2.5 liters. The patient could not tolerate CPAP as an outpatient. Continue home inhalers if any. 6.  Diabetes mellitus, insulin-dependent, low sugars today. Decrease the dose of his NPH insulin, continue sliding scale insulin.  7.  Eardrum rupture on the right side from a sneeze yesterday while on Coumadin. Continue his ofloxacin ear drops, symptoms are improving.   Plan level three consult   Electronic Signatures: Hortencia Pilar (MD)  (Signed 14-Feb-16 11:05)  Authored: General Aspect/Present Illness, Home Medications, Allergies, History and Physical Exam, Vital Signs, Labs, Radiology, Impression/Plan   Last Updated: 14-Feb-16 11:05 by Hortencia Pilar (MD)

## 2015-01-20 NOTE — Op Note (Signed)
PATIENT NAME:  Tyler [name], [name] MR#:  [id number] DATE OF BIRTH:  Nov 16, 1955  PREOPERATIVE DIAGNOSIS:  Nuclear sclerotic cataract of the left eye.   POSTOPERATIVE DIAGNOSIS:  Nuclear sclerotic cataract of the left eye.   OPERATIVE PROCEDURE:  Cataract extraction by phacoemulsification with implant of intraocular lens to left eye.   SURGEON:  Galen ManilaWilliam Becca Bayne, MD.   ANESTHESIA:  1. Managed anesthesia care.  2. Topical tetracaine drops followed by 2% Xylocaine jelly applied in the preoperative holding area.   COMPLICATIONS:  None.   TECHNIQUE:   Stop and chop.  DESCRIPTION OF PROCEDURE:  The patient was examined and consented in the preoperative holding area where the aforementioned topical anesthesia was applied to the left eye and then brought back to the Operating Room where the left eye was prepped and draped in the usual sterile ophthalmic fashion and a lid speculum was placed. A paracentesis was created with the side port blade and the anterior chamber was filled with viscoelastic. A near clear corneal incision was performed with the steel keratome. A continuous curvilinear capsulorrhexis was performed with a cystotome followed by the capsulorrhexis forceps. Hydrodissection and hydrodelineation were carried out with BSS on a blunt cannula. The lens was removed in a stop and chop technique and the remaining cortical material was removed with the irrigation-aspiration handpiece. The capsular bag was inflated with viscoelastic and the Tecnis ZCB00 20.5-diopter lens, serial number [id number], was placed in the capsular bag without complication. The remaining viscoelastic was removed from the eye with the irrigation-aspiration handpiece. The wounds were hydrated. The anterior chamber was flushed with Miostat and the eye was inflated to physiologic pressure. 0.1 mL of cefuroxime concentration 10 mg/mL was placed in the anterior chamber. The wounds were found to be water tight. The eye was dressed  with Vigamox. The patient was given protective glasses to wear throughout the day and a shield with which to sleep tonight. The patient was also given drops with which to begin a drop regimen today and will follow-up with me in one day.    ____________________________ Jerilee FieldWilliam L. Samariyah Cowles, MD wlp:kc D: 01/01/2015 21:41:00 ET T: 01/01/2015 22:42:37 ET JOB#: [id number]  cc: Harley Fitzwater L. Javeion Cannedy, MD, <Dictator> Jerilee FieldWILLIAM L Brenlee Koskela MD ELECTRONICALLY SIGNED 01/02/2015 11:06

## 2015-01-20 NOTE — Discharge Summary (Signed)
PATIENT NAME:  Tyler [name], Tyler [name] MR#:  [id number] DATE OF BIRTH:  06-Jun-1956  DATE OF ADMISSION:  10/30/2014 DATE OF DISCHARGE:  11/06/2014  DISCHARGE DIAGNOSES: 1.  Acute on chronic diastolic congestive heart failure with poor urine output and generalized anasarca. 2.  Chronic kidney disease, stage III.  3.  History of deep venous thrombosis and pulmonary embolism, on Coumadin.  4.  Diabetes mellitus type 2, insulin-dependent.  5.  Obstructive sleep apnea with chronic respiratory failure, needs 2.5 liters of oxygen.  6.  Eardrum rupture on the right side, status post ofloxacin eardrops.   CONSULTATIONS: Cardiology, Dr. Lady GaryFath; nephrology, Dr. Thedore MinsSingh.  HOSPITAL COURSE: This is an addendum to interim discharge summary dictated by Dr. Luberta MutterKonidena on February 14th. Please review Dr. Suzanne BoronKonidena's interim discharge summary. 1.  Acute on chronic diastolic congestive heart failure with poor urine output. The patient was placed on Lasix GTT. The patient's weight significantly dropped and net loss of 13 liters since admission. His Lasix GTT was discontinued yesterday and he was started on torsemide 40 mg p.o. b.i.d. and also Zaroxolyn 5 mg is added to the regimen. The patient is supposed to take Zaroxolyn 5 mg on Monday, Wednesday and Friday, which is 3 times [name] week. Echocardiogram has revealed ejection fraction of 55% to 60% and the echo was done on February 9th. The patient is to follow up with cardiology, Dr. Lady GaryFath, and nephrology, Dr. Cherylann RatelLateef, as an outpatient. As Zaroxolyn is added to the regimen and torsemide dose is increased, the patient's Coreg and losartan were held. The plan is to continue the patient's spironolactone.  2.  Chronic kidney disease, stage III. Creatinine is stable. Continue following up with nephrology.  3.  Chronic history of DVT and pulmonary embolism. The patient is on Coumadin. INR is at 3 today. He has received 7.5 mg of Coumadin. Will give 5 mg of Coumadin tomorrow and the patient is  to repeat PT/INR done on Thursday for further dosing of Coumadin.  4.  Type 2 diabetes mellitus, insulin-dependent. The patient sees endocrinologist and his NPH insulin was significantly decreased recently as the patient was not eating much. During the hospital course, the patient was given NovoLog 70/30. That was changed to Levemir 30 units subcutaneously twice [name] day as NPH insulin is not available in the hospital pharmacy. The plan is to continue NPH insulin, 30 units subcutaneously twice [name] day, and the patient is supposed to take aspart ultra short-acting insulin 5 units 3 times [name] day with meals only. He is to follow up with endocrinologist as an outpatient in 1 to 2 weeks for further management. The patient is to follow up with outpatient diabetic clinic. 5.  Obstructive sleep apnea and chronic respiratory failure, which has been worse with anasarca and acute CHF. The patient is saturating 85% on room air at rest and his pulse ox is 91 to 92% on 2.5 liters with exertion. He did not tolerate CPAP as an outpatient so the plan is to continue 2.5 liters of oxygen via nasal cannula. This needs to be taken care by care management.  6.  Eardrum rupture with sneezing. The patient was given ofloxacin ear drops and he has finished the course of eardrops during the hospital stay.   PHYSICAL EXAMINATION: Overall his condition is improved.  VITAL SIGNS: Today temperature is 97.5, pulse 57 to 71, respirations 18, blood pressure 105/67, pulse ox 92% with exertion on 2.5 liters of oxygen via nasal cannula. At rest he is saturating 85%  on room air.  GENERAL APPEARANCE: Not in acute distress. Morbidly obese.  HEENT: Normocephalic, atraumatic. Pupils are equally reacting to light and accommodation. No scleral icterus. No conjunctival injection. No sinus tenderness. Moist mucous membranes.  NECK: Supple. No JVD. No thyromegaly. Range of motion is intact. LUNGS: Basilar rhonchi.  HEART: S1 and S2 normal. Regular rate and  rhythm.  GASTROINTESTINAL: Soft, obese. Bowel sounds are positive in all 4 quadrants. Nontender, nondistended. No masses felt.  NEUROLOGIC: Awake, alert and oriented x3. Cranial nerves II through XII are grossly intact. Motor and sensory are intact. Reflexes are 2+.  EXTREMITIES: The patient has 3+ pitting edema.  SKIN: No rashes. No ulcers.  DIAGNOSTIC DATA:  [name] 2-D echocardiogram performed on 9th February: Left ventricular ejection fraction 55% to 60%, moderate pleural effusion in the left lateral region.   On February 15th, creatinine is 1.65, BUN 20, sodium 142, potassium 3.5. GFR is 46. Serum osmolality and calcium are normal. The patient's Accu-Cheks: 163, 131, 165, 136. His hemoglobin today is at 12.1. PT 31.5. INR 3.   DISCHARGE MEDICATIONS: Pravastatin 40 mg p.o. once daily, cyanocobalamin 1000 mcg/mL 1 mL injection once [name], levothyroxine 100 mcg 1 tablet p.o. once daily, paroxetine 20 mg p.o. once daily, cyanocobalamin 1000 mcg sublingually 2 tablets once daily, Gabapentin 100 mg 1 capsule p.o. 2 times [name] day, nystatin topical 100,000 units/gram applied to the effected area 2 times [name] day, omeprazole 20 mg 1 capsule p.o. once daily, oxymetazoline nasal spray 2 sprays nasally 2 times [name] day, spironolactone 25 mg once daily, torsemide 20 mg 2 tablets p.o. 2 times [name] day, potassium chloride 20 mEq 1 tablet p.o. once daily, Coumadin 5 mg 1 tablet p.o. once on Wednesday, that is February 17th, and the patient's PT/INR needs to be checked on February 18th, Thursday, for further dosing of Coumadin, Novolin N (NPH) 100 units/ mL 30 units subcutaneously 2 times [name] day, insulin aspart 100 units/ mL take 5 units subcutaneously 3 times [name] day with meals, metolazone 5 mg 1 tablet p.o. 3 times [name] week on Monday, Wednesday and Friday, Colace 100 mg 1 capsule p.o. 2 times [name] day as needed for constipation.   DISCHARGE INSTRUCTIONS: The patient's losartan, Novolin R and Coreg were discontinued. Ofloxacin drops is  discontinued. Continue oxygen 2.5 liters via nasal cannula. The patient is to be discharged home with home health with nursing and nursing aid. The patient is to get low sodium, low fat, carb-controlled diet, regular consistency.   ACTIVITY: As tolerated.   DISCHARGE FOLLOWUP: Follow up with nephrology, Dr. Cherylann Ratel, in [name] week; cardiology, Dr. Lady Gary, in 1 to 2 weeks; endocrinology in 1 week; outpatient follow up with CHF and diabetic clinic. Monitor daily weights.   The diagnosis and plan of care was discussed in detail with the patient and his wife at bedside. They both verbalized understanding of the plan.   TOTAL TIME SPENT ON THE DISCHARGE: 45 minutes.  ____________________________ Ramonita Lab, MD ag:sb D: 11/06/2014 13:50:49 ET T: 11/06/2014 14:53:33 ET JOB#: [id number]  cc: Ramonita Lab, MD, <Dictator> PCP Endocrinology Nephrology Cardiology Ramonita Lab MD ELECTRONICALLY SIGNED 11/16/2014 14:42

## 2015-01-20 NOTE — Op Note (Signed)
PATIENT NAME:  Tyler [name], Irish A MR#:  [id number] DATE OF BIRTH:  29-Apr-1956  DATE OF PROCEDURE:  12/11/2014  PREOPERATIVE DIAGNOSIS:  Nuclear sclerotic cataract of the right eye.   POSTOPERATIVE DIAGNOSIS:  Nuclear sclerotic cataract of the right eye.   OPERATIVE PROCEDURE:  Cataract extraction by phacoemulsification with implant of intraocular lens to right eye.   SURGEON:  Galen ManilaWilliam Hagop Mccollam, MD.   ANESTHESIA:  1. Managed anesthesia care.  2. Topical tetracaine drops followed by 2% Xylocaine jelly applied in the preoperative holding area.   COMPLICATIONS:  None.   TECHNIQUE: Stop and chop.  DESCRIPTION OF PROCEDURE:  The patient was examined and consented in the preoperative holding area where the aforementioned topical anesthesia was applied to the right eye and then brought back to the operating room where the right eye was prepped and draped in the usual sterile ophthalmic fashion and a lid speculum was placed. A paracentesis was created with the side port blade and the anterior chamber was filled with viscoelastic. A near clear corneal incision was performed with the steel keratome. A continuous curvilinear capsulorrhexis was performed with a cystotome followed by the capsulorrhexis forceps. Hydrodissection and hydrodelineation were carried out with BSS on a blunt cannula. The lens was removed in a stop and chop technique and the remaining cortical material was removed with the irrigation-aspiration handpiece. The capsular bag was inflated with viscoelastic and the Tecnis ZCB00 20.5-diopter lens, serial number [id number] was placed in the capsular bag without complication. The remaining viscoelastic was removed from the eye with the irrigation-aspiration handpiece. The wounds were hydrated. The anterior chamber was flushed with Miostat and the eye was inflated to physiologic pressure. 0.1 mL of cefuroxime concentration 10 mg/mL was placed in the anterior chamber. The wounds were found to be  water tight. The eye was dressed with Vigamox. The patient was given protective glasses to wear throughout the day and a shield with which to sleep tonight. The patient was also given drops with which to begin a drop regimen today and will follow-up with me in one day.    ____________________________ Jerilee FieldWilliam L. Brighten Orndoff, MD wlp:bm D: 12/11/2014 21:19:28 ET T: 12/12/2014 07:09:58 ET JOB#: [id number]  cc: Pearson Reasons L. Koraima Albertsen, MD, <Dictator> Jerilee FieldWILLIAM L Johnson Arizola MD ELECTRONICALLY SIGNED 12/12/2014 12:04

## 2015-01-22 ENCOUNTER — Other Ambulatory Visit: Payer: Self-pay | Admitting: *Deleted

## 2015-01-22 ENCOUNTER — Encounter: Payer: Self-pay | Admitting: *Deleted

## 2015-01-22 VITALS — BP 120/70 | HR 60 | Resp 20

## 2015-01-22 DIAGNOSIS — I5041 Acute combined systolic (congestive) and diastolic (congestive) heart failure: Secondary | ICD-10-CM

## 2015-01-22 NOTE — Patient Outreach (Signed)
Tyler [name] [facility]) Care Management  [place]  01/22/2015   Tyler [name] 08/07/1956 [id number]  Subjective:  Pt reports had left cataract done 5/1, to f/u with Eye MD next week for final size reading Glasses.  Pt states to see Dr. Raechel Ache this month.  Pt states to f/u with dentist tomorrow for abscessed  Tooth,on antibiotic, needed to off Warfarin for 2 days.  Pt states sugars are fair, f/u with diabetic MD.   Pt states don't have scale yet, last recorded  Weight was 340lbs- last month kidney MD office visit.  Pt states he plans to get scale, to ask son who works at [employer] .  Pt states kidney MD has been  Keeping weekly checks on sodium level (low), did come up to 130.  Pt states he is to f/u with MD again  Next week.  Pt states edema in legs came down a little.    Objective: Review of Systems  Cardiovascular: Positive for leg swelling.                            +2-3  edema to bilateral lower legs/top of feet.                            Lungs clear.   Filed Vitals:   01/22/15 1131  BP: 120/70  Pulse: 60  Resp: 20     Current Medications:  Current Outpatient Prescriptions  Medication Sig Dispense Refill  . amoxicillin (AMOXIL) 500 MG capsule Take 500 mg by mouth 3 (three) times daily. Pt taking for dental work scheduled for 5/4.    Marland Kitchen colchicine 0.6 MG tablet TAKE ONE TABLET BY MOUTH TWICE DAILY AS NEEDED FOR  WRIST  PAIN    . Cyanocobalamin (VITAMIN B-12) 1000 MCG SUBL Take by mouth.    . gabapentin (NEURONTIN) 100 MG capsule Take by mouth.    . insulin NPH Human (HUMULIN N,NOVOLIN N) 100 UNIT/ML injection 90 units 30 min prior to breakfast, 90 units 30 min prior to supper    . insulin regular (NOVOLIN R,HUMULIN R) 100 units/mL injection 10 units 30 min prior to meal if BG >120 & eating    . levothyroxine (SYNTHROID, LEVOTHROID) 100 MCG tablet Take by mouth.    . metolazone (ZAROXOLYN) 5 MG tablet Take by mouth.    Marland Kitchen omeprazole (PRILOSEC OTC) 20 MG  tablet Take 20 mg by mouth daily.    Marland Kitchen PARoxetine (PAXIL) 20 MG tablet Take by mouth.    . potassium chloride (K-DUR,KLOR-CON) 10 MEQ tablet TAKE TWO TABLETS BY MOUTH ONCE DAILY    . pravastatin (PRAVACHOL) 40 MG tablet Take by mouth.    . spironolactone (ALDACTONE) 25 MG tablet Take by mouth.    . torsemide (DEMADEX) 20 MG tablet Take by mouth.    . nystatin (MYCOSTATIN) powder Apply topically.    . warfarin (COUMADIN) 10 MG tablet Take 10 mg by mouth daily. Pt states 1/2 tablet Monday-Wednesday-Friday.   Whole tablet Tuesday, Thursday,Saturday     No current facility-administered medications for this visit.    Fall/Depression Screening: PHQ 2/9 Scores 01/22/2015 12/21/2014  PHQ - 2 Score - 1  Exception Documentation (No Data) -    Assessment:  HF- edema in lower extremities, support stockings on.  Pt not weighing, no scale yet.   RN CM discussed getting a scale through [facility], but pt  to get through son.     Plan:   Pt to have son purchase scale, start weighing/record             Pt to f/u with dentist tomorrow - tooth abcsess             Pt to f/u with Kidney MD this month.             Pt to f/u with Dr. Raechel Ache this month.              RN CM to continue to provide ongoing nurse case management services, next h/v 6/2.              RN CM to update care plan as needed.  [facility] CM Care Plan        Most Recent Value   Problem One    [facility] CM Care Plan Problem One     Care Plan for Problem One  Active   Patient Has Long Term Goal?  Yes   [facility] Long Term Goal (31-90 days)  Pt to have son  purchase scale within next 31 days, son works at [employer] Start Date  01/22/15   [place] Term Goal Met Date  -- [5/3- goal not met,  pt did not obtain scale yet ]   Interventions for Problem One Long Term Goal  Reinforced importance of pt weighing daily/recording.    Problem One Short Term Goals    Number of Short Term Goals for Problem One  One, Two   Short Term Goal #1    [facility] CM Short  Term Goal #1 Met Date  01/22/15 [pt states been getting out more, swelling down little ]   Short Term Goal #2    [facility] CM Short Term Goal #2 Met Date  -- [goal met. ]   Short Term Goal #3    [facility] CM Short Term Goal #3 (0-30 days)  with increased activities, pt to elevate legs more to help bring  edema down    [facility] CM Short Term Goal #3 Start Date  01/22/15   Interventions for Short Tern Goal #3  Discussed with pt pacing activities, take time to elevate legs to help bring  edema  down    Short Term Goal #4    Short Tern Goal #5    Problem Two    Care Plan Problem Two  Recent hospitalization- HF    Role Documenting the Problem Two  Care Management Coordinator   EMMI for Problem Two  Yes   [facility] CM Care Plan Problem Two    Care Plan for Problem Two  Not Active   Patient Has Long Term Goal Problem Two?  Yes   [facility] Long Term Goal (31-90) days  Pt would not be readmitted in the next 31 days    [facility] Long Term Goal Start Date  11/09/14   [facility] Long Term Goal Met Date  12/06/14   Interventions for Problem Two Long Term Goal   Discussed  with pt plan to f/u with weekly phone calls  30 days post discharge as part of transition of care    Problem Two Short Term Goals    Short Term Goal #1    [facility] CM Short Term Goal #1 (0-30 days)  pt would pick up new med from pharmacy in the next 1-2 days    [facility] CM Short Term Goal #1 Start Date  11/09/14  [facility] CM Short Term Goal #1 Met Date   11/16/14 [pt reported 2/26- had all of his meds. ]   Short Term Goal #2    Short Term Goal #3    Short Term Goal #4    Short Term Goal #5    Problem Three    [facility] CM Care Plan Problem Three    Problem Three Short Term Goals    Short Term Goal #1    Short Term Goal #2    Short Term Goal #3    Short Term Goal #4    Short Term Goal #5                    Jazlene Bares M.   [facility]  [phone]

## 2015-01-22 NOTE — Patient Outreach (Signed)
[facility]) Care Management  01/22/2015  Pauline AusMichael A Veltri 1956/08/11 [id number]      Documentation encounter:  Correction on date when is RN CM's  next scheduled home visit, should be 6/3, not 6/2 as recorded in 5/2 note.      Shayne Alkenose M.   [place] RN CCM [facility]  [phone]

## 2015-01-23 ENCOUNTER — Ambulatory Visit: Payer: Commercial Managed Care - HMO | Admitting: *Deleted

## 2015-02-06 ENCOUNTER — Encounter: Payer: Self-pay | Admitting: Internal Medicine

## 2015-02-16 ENCOUNTER — Emergency Department: Payer: Commercial Managed Care - HMO

## 2015-02-16 ENCOUNTER — Inpatient Hospital Stay
Admit: 2015-02-16 | Discharge: 2015-02-16 | Disposition: A | Discharge status: Home / Self Care | Payer: Commercial Managed Care - HMO | Attending: Internal Medicine | Admitting: Internal Medicine

## 2015-02-16 ENCOUNTER — Inpatient Hospital Stay
Admission: EM | Admit: 2015-02-16 | Discharge: 2015-02-18 | DRG: 291 | Disposition: A | Discharge status: Home / Self Care | Payer: Commercial Managed Care - HMO | Attending: Specialist | Admitting: Specialist

## 2015-02-16 ENCOUNTER — Encounter: Payer: Self-pay | Admitting: Emergency Medicine

## 2015-02-16 DIAGNOSIS — M109 Gout, unspecified: Secondary | ICD-10-CM | POA: Diagnosis present

## 2015-02-16 DIAGNOSIS — G4733 Obstructive sleep apnea (adult) (pediatric): Secondary | ICD-10-CM | POA: Diagnosis present

## 2015-02-16 DIAGNOSIS — E1140 Type 2 diabetes mellitus with diabetic neuropathy, unspecified: Secondary | ICD-10-CM | POA: Diagnosis present

## 2015-02-16 DIAGNOSIS — Z7901 Long term (current) use of anticoagulants: Secondary | ICD-10-CM | POA: Diagnosis not present

## 2015-02-16 DIAGNOSIS — E785 Hyperlipidemia, unspecified: Secondary | ICD-10-CM | POA: Diagnosis present

## 2015-02-16 DIAGNOSIS — Z86711 Personal history of pulmonary embolism: Secondary | ICD-10-CM | POA: Diagnosis not present

## 2015-02-16 DIAGNOSIS — I4891 Unspecified atrial fibrillation: Secondary | ICD-10-CM | POA: Diagnosis present

## 2015-02-16 DIAGNOSIS — F419 Anxiety disorder, unspecified: Secondary | ICD-10-CM | POA: Diagnosis present

## 2015-02-16 DIAGNOSIS — Z794 Long term (current) use of insulin: Secondary | ICD-10-CM

## 2015-02-16 DIAGNOSIS — I272 Other secondary pulmonary hypertension: Secondary | ICD-10-CM | POA: Diagnosis present

## 2015-02-16 DIAGNOSIS — Z9119 Patient's noncompliance with other medical treatment and regimen: Secondary | ICD-10-CM | POA: Diagnosis present

## 2015-02-16 DIAGNOSIS — I509 Heart failure, unspecified: Secondary | ICD-10-CM | POA: Diagnosis present

## 2015-02-16 DIAGNOSIS — Z9981 Dependence on supplemental oxygen: Secondary | ICD-10-CM | POA: Diagnosis not present

## 2015-02-16 DIAGNOSIS — J9620 Acute and chronic respiratory failure, unspecified whether with hypoxia or hypercapnia: Secondary | ICD-10-CM | POA: Diagnosis present

## 2015-02-16 DIAGNOSIS — F17210 Nicotine dependence, cigarettes, uncomplicated: Secondary | ICD-10-CM | POA: Diagnosis present

## 2015-02-16 DIAGNOSIS — I5023 Acute on chronic systolic (congestive) heart failure: Principal | ICD-10-CM | POA: Diagnosis present

## 2015-02-16 DIAGNOSIS — J9610 Chronic respiratory failure, unspecified whether with hypoxia or hypercapnia: Secondary | ICD-10-CM | POA: Diagnosis present

## 2015-02-16 DIAGNOSIS — E039 Hypothyroidism, unspecified: Secondary | ICD-10-CM | POA: Diagnosis present

## 2015-02-16 DIAGNOSIS — N183 Chronic kidney disease, stage 3 (moderate): Secondary | ICD-10-CM | POA: Diagnosis present

## 2015-02-16 DIAGNOSIS — Z86718 Personal history of other venous thrombosis and embolism: Secondary | ICD-10-CM

## 2015-02-16 DIAGNOSIS — I129 Hypertensive chronic kidney disease with stage 1 through stage 4 chronic kidney disease, or unspecified chronic kidney disease: Secondary | ICD-10-CM | POA: Diagnosis present

## 2015-02-16 DIAGNOSIS — Z6841 Body Mass Index (BMI) 40.0 and over, adult: Secondary | ICD-10-CM | POA: Diagnosis not present

## 2015-02-16 DIAGNOSIS — E871 Hypo-osmolality and hyponatremia: Secondary | ICD-10-CM | POA: Diagnosis present

## 2015-02-16 DIAGNOSIS — J441 Chronic obstructive pulmonary disease with (acute) exacerbation: Secondary | ICD-10-CM | POA: Diagnosis present

## 2015-02-16 HISTORY — DX: Hypothyroidism, unspecified: E03.9

## 2015-02-16 HISTORY — DX: Gout, unspecified: M10.9

## 2015-02-16 HISTORY — DX: Chronic kidney disease, stage 3 unspecified: N18.30

## 2015-02-16 HISTORY — DX: Chronic kidney disease, stage 3 (moderate): N18.3

## 2015-02-16 LAB — CBC WITH DIFFERENTIAL/PLATELET
Basophils Absolute: 0 10*3/uL (ref 0–0.1)
Basophils Relative: 1 %
EOS ABS: 0.1 10*3/uL (ref 0–0.7)
Eosinophils Relative: 2 %
HEMATOCRIT: 35.2 % — AB (ref 40.0–52.0)
HEMOGLOBIN: 11.5 g/dL — AB (ref 13.0–18.0)
Lymphocytes Relative: 14 %
Lymphs Abs: 0.9 10*3/uL — ABNORMAL LOW (ref 1.0–3.6)
MCH: 27.2 pg (ref 26.0–34.0)
MCHC: 32.6 g/dL (ref 32.0–36.0)
MCV: 83.5 fL (ref 80.0–100.0)
Monocytes Absolute: 0.7 10*3/uL (ref 0.2–1.0)
Monocytes Relative: 10 %
NEUTROS PCT: 73 %
Neutro Abs: 4.7 10*3/uL (ref 1.4–6.5)
PLATELETS: 224 10*3/uL (ref 150–440)
RBC: 4.21 MIL/uL — ABNORMAL LOW (ref 4.40–5.90)
RDW: 17.4 % — ABNORMAL HIGH (ref 11.5–14.5)
WBC: 6.4 10*3/uL (ref 3.8–10.6)

## 2015-02-16 LAB — COMPREHENSIVE METABOLIC PANEL
ALK PHOS: 243 U/L — AB (ref 38–126)
ALT: 10 U/L — ABNORMAL LOW (ref 17–63)
AST: 23 U/L (ref 15–41)
Albumin: 2.7 g/dL — ABNORMAL LOW (ref 3.5–5.0)
Anion gap: 14 (ref 5–15)
BUN: 29 mg/dL — ABNORMAL HIGH (ref 6–20)
CO2: 29 mmol/L (ref 22–32)
Calcium: 8.5 mg/dL — ABNORMAL LOW (ref 8.9–10.3)
Chloride: 89 mmol/L — ABNORMAL LOW (ref 101–111)
Creatinine, Ser: 1.44 mg/dL — ABNORMAL HIGH (ref 0.61–1.24)
GFR calc Af Amer: 60 mL/min — ABNORMAL LOW (ref >60)
GFR calc non Af Amer: 52 mL/min — ABNORMAL LOW (ref >60)
Glucose, Bld: 280 mg/dL — ABNORMAL HIGH (ref 65–99)
POTASSIUM: 3.6 mmol/L (ref 3.5–5.1)
Sodium: 132 mmol/L — ABNORMAL LOW (ref 135–145)
TOTAL PROTEIN: 6.8 g/dL (ref 6.5–8.1)
Total Bilirubin: 0.6 mg/dL (ref 0.3–1.2)

## 2015-02-16 LAB — BRAIN NATRIURETIC PEPTIDE: B NATRIURETIC PEPTIDE 5: 486 pg/mL — AB (ref 0.0–100.0)

## 2015-02-16 LAB — GLUCOSE, CAPILLARY
Glucose-Capillary: 234 mg/dL — ABNORMAL HIGH (ref 65–99)
Glucose-Capillary: 189 mg/dL — ABNORMAL HIGH (ref 65–99)

## 2015-02-16 LAB — TROPONIN I
TROPONIN I: 0.03 ng/mL (ref <0.031)
Troponin I: 0.03 ng/mL (ref <0.031)
Troponin I: 0.03 ng/mL (ref <0.031)

## 2015-02-16 LAB — APTT: APTT: 39 s — AB (ref 24–36)

## 2015-02-16 LAB — PROTIME-INR
INR: 1.65
Prothrombin Time: 19.7 seconds — ABNORMAL HIGH (ref 11.4–15.0)

## 2015-02-16 LAB — MAGNESIUM: MAGNESIUM: 1.8 mg/dL (ref 1.7–2.4)

## 2015-02-16 MED ORDER — POTASSIUM CHLORIDE CRYS ER 10 MEQ PO TBCR
10.0000 meq | EXTENDED_RELEASE_TABLET | Freq: Two times a day (BID) | ORAL | Status: DC
  Administered 2015-02-17 – 2015-02-18 (×2): 10 meq via ORAL
  Filled 2015-02-16 (×4): qty 1

## 2015-02-16 MED ORDER — GABAPENTIN 100 MG PO CAPS
100.0000 mg | ORAL_CAPSULE | Freq: Two times a day (BID) | ORAL | Status: DC
  Administered 2015-02-16 – 2015-02-18 (×4): 100 mg via ORAL
  Filled 2015-02-16 (×5): qty 1

## 2015-02-16 MED ORDER — ONDANSETRON HCL 4 MG PO TABS: 4.0000 mg | ORAL_TABLET | Freq: Four times a day (QID) | ORAL | Status: DC | PRN

## 2015-02-16 MED ORDER — SENNOSIDES-DOCUSATE SODIUM 8.6-50 MG PO TABS: 1.0000 | ORAL_TABLET | Freq: Every evening | ORAL | Status: DC | PRN

## 2015-02-16 MED ORDER — WARFARIN - PHARMACIST DOSING INPATIENT: Freq: Every day | Status: DC

## 2015-02-16 MED ORDER — SODIUM CHLORIDE 0.9 % IV SOLN: 250.0000 mL | INTRAVENOUS | Status: DC | PRN

## 2015-02-16 MED ORDER — LEVOTHYROXINE SODIUM 100 MCG PO TABS
100.0000 ug | ORAL_TABLET | Freq: Every day | ORAL | Status: DC
  Administered 2015-02-17 – 2015-02-18 (×2): 100 ug via ORAL
  Filled 2015-02-16 (×2): qty 1

## 2015-02-16 MED ORDER — SPIRONOLACTONE 25 MG PO TABS
25.0000 mg | ORAL_TABLET | Freq: Every day | ORAL | Status: DC
  Administered 2015-02-17 – 2015-02-18 (×2): 25 mg via ORAL
  Filled 2015-02-16 (×2): qty 1

## 2015-02-16 MED ORDER — FUROSEMIDE 10 MG/ML IJ SOLN
40.0000 mg | Freq: Once | INTRAMUSCULAR | Status: AC
  Administered 2015-02-16: 40 mg via INTRAVENOUS

## 2015-02-16 MED ORDER — LOSARTAN POTASSIUM 50 MG PO TABS
25.0000 mg | ORAL_TABLET | Freq: Every day | ORAL | Status: DC
  Administered 2015-02-17 – 2015-02-18 (×2): 25 mg via ORAL
  Filled 2015-02-16: qty 1
  Filled 2015-02-16: qty 2

## 2015-02-16 MED ORDER — INSULIN REGULAR HUMAN 100 UNIT/ML IJ SOLN: 10.0000 [IU] | Freq: Two times a day (BID) | INTRAMUSCULAR | Status: DC

## 2015-02-16 MED ORDER — SODIUM CHLORIDE 0.9 % IJ SOLN
3.0000 mL | Freq: Two times a day (BID) | INTRAMUSCULAR | Status: DC
Start: 2015-02-16 — End: 2015-02-18
  Administered 2015-02-16 – 2015-02-18 (×5): 3 mL via INTRAVENOUS

## 2015-02-16 MED ORDER — NICOTINE 7 MG/24HR TD PT24
7.0000 mg | MEDICATED_PATCH | Freq: Every day | TRANSDERMAL | Status: DC
  Administered 2015-02-18: 7 mg via TRANSDERMAL
  Filled 2015-02-16 (×2): qty 1

## 2015-02-16 MED ORDER — HEPARIN SODIUM (PORCINE) 5000 UNIT/ML IJ SOLN
5000.0000 [IU] | Freq: Three times a day (TID) | INTRAMUSCULAR | Status: DC
  Administered 2015-02-16: 5000 [IU] via SUBCUTANEOUS
  Filled 2015-02-16: qty 1

## 2015-02-16 MED ORDER — TIOTROPIUM BROMIDE-OLODATEROL 2.5-2.5 MCG/ACT IN AERS: 1.0000 | INHALATION_SPRAY | Freq: Every day | RESPIRATORY_TRACT | Status: DC | PRN

## 2015-02-16 MED ORDER — INSULIN ASPART 100 UNIT/ML ~~LOC~~ SOLN
10.0000 [IU] | Freq: Two times a day (BID) | SUBCUTANEOUS | Status: DC
  Administered 2015-02-17 (×2): 10 [IU] via SUBCUTANEOUS
  Filled 2015-02-16 (×2): qty 10

## 2015-02-16 MED ORDER — PRAVASTATIN SODIUM 20 MG PO TABS
40.0000 mg | ORAL_TABLET | Freq: Every day | ORAL | Status: DC
  Administered 2015-02-16 – 2015-02-17 (×2): 40 mg via ORAL
  Filled 2015-02-16 (×2): qty 2

## 2015-02-16 MED ORDER — SODIUM CHLORIDE 0.9 % IJ SOLN: 3.0000 mL | INTRAMUSCULAR | Status: DC | PRN

## 2015-02-16 MED ORDER — ZOLPIDEM TARTRATE 5 MG PO TABS
5.0000 mg | ORAL_TABLET | Freq: Every evening | ORAL | Status: DC | PRN
  Administered 2015-02-16: 5 mg via ORAL
  Filled 2015-02-16: qty 1

## 2015-02-16 MED ORDER — CARVEDILOL 3.125 MG PO TABS
3.1250 mg | ORAL_TABLET | Freq: Two times a day (BID) | ORAL | Status: DC
  Administered 2015-02-18: 3.125 mg via ORAL
  Filled 2015-02-16 (×2): qty 1

## 2015-02-16 MED ORDER — INSULIN DETEMIR 100 UNIT/ML ~~LOC~~ SOLN
80.0000 [IU] | Freq: Two times a day (BID) | SUBCUTANEOUS | Status: DC
  Administered 2015-02-17 – 2015-02-18 (×3): 80 [IU] via SUBCUTANEOUS
  Filled 2015-02-16 (×5): qty 0.8

## 2015-02-16 MED ORDER — ACETAMINOPHEN 325 MG PO TABS
650.0000 mg | ORAL_TABLET | Freq: Four times a day (QID) | ORAL | Status: DC | PRN
  Administered 2015-02-17: 650 mg via ORAL
  Filled 2015-02-16: qty 2

## 2015-02-16 MED ORDER — ONDANSETRON HCL 4 MG/2ML IJ SOLN: 4.0000 mg | Freq: Four times a day (QID) | INTRAMUSCULAR | Status: DC | PRN

## 2015-02-16 MED ORDER — ASPIRIN EC 81 MG PO TBEC
81.0000 mg | DELAYED_RELEASE_TABLET | Freq: Every day | ORAL | Status: DC
  Administered 2015-02-17 – 2015-02-18 (×2): 81 mg via ORAL
  Filled 2015-02-16 (×2): qty 1

## 2015-02-16 MED ORDER — INSULIN ASPART 100 UNIT/ML ~~LOC~~ SOLN
0.0000 [IU] | Freq: Three times a day (TID) | SUBCUTANEOUS | Status: DC
  Administered 2015-02-16 – 2015-02-17 (×4): 3 [IU] via SUBCUTANEOUS
  Filled 2015-02-16 (×4): qty 3

## 2015-02-16 MED ORDER — ALBUTEROL SULFATE (2.5 MG/3ML) 0.083% IN NEBU: 2.5000 mg | INHALATION_SOLUTION | RESPIRATORY_TRACT | Status: DC | PRN

## 2015-02-16 MED ORDER — FUROSEMIDE 10 MG/ML IJ SOLN
40.0000 mg | Freq: Two times a day (BID) | INTRAMUSCULAR | Status: DC
  Administered 2015-02-17 – 2015-02-18 (×3): 40 mg via INTRAVENOUS
  Filled 2015-02-16 (×5): qty 4

## 2015-02-16 MED ORDER — PAROXETINE HCL 10 MG PO TABS
20.0000 mg | ORAL_TABLET | Freq: Every day | ORAL | Status: DC
  Administered 2015-02-17 – 2015-02-18 (×2): 20 mg via ORAL
  Filled 2015-02-16 (×2): qty 2

## 2015-02-16 MED ORDER — ACETAMINOPHEN 650 MG RE SUPP: 650.0000 mg | Freq: Four times a day (QID) | RECTAL | Status: DC | PRN

## 2015-02-16 MED ORDER — FUROSEMIDE 10 MG/ML IJ SOLN
INTRAMUSCULAR | Status: AC
  Administered 2015-02-16: 40 mg via INTRAVENOUS
  Filled 2015-02-16: qty 4

## 2015-02-16 MED ORDER — COLCHICINE 0.6 MG PO TABS
0.6000 mg | ORAL_TABLET | Freq: Every day | ORAL | Status: DC
  Administered 2015-02-17 – 2015-02-18 (×2): 0.6 mg via ORAL
  Filled 2015-02-16 (×2): qty 1

## 2015-02-16 MED ORDER — INSULIN ASPART 100 UNIT/ML ~~LOC~~ SOLN
0.0000 [IU] | Freq: Every day | SUBCUTANEOUS | Status: DC
  Filled 2015-02-16: qty 4

## 2015-02-16 MED ORDER — INSULIN NPH (HUMAN) (ISOPHANE) 100 UNIT/ML ~~LOC~~ SUSP: 80.0000 [IU] | Freq: Two times a day (BID) | SUBCUTANEOUS | Status: DC

## 2015-02-16 MED ORDER — METOLAZONE 5 MG PO TABS
2.5000 mg | ORAL_TABLET | ORAL | Status: DC
  Administered 2015-02-18: 2.5 mg via ORAL
  Filled 2015-02-16: qty 1

## 2015-02-16 NOTE — Progress Notes (Signed)
ANTICOAGULATION CONSULT NOTE - Initial Consult  Pharmacy Consult for warfarin Indication: atrial fibrillation  Allergies  Allergen Reactions  . Enalapril Cough  . Lovastatin Other (See Comments)    tachycardia  . Atorvastatin Palpitations    Patient Measurements: Height: 6' (182.9 cm) Weight: (!) 354 lb 9 oz (160.828 kg) IBW/kg (Calculated) : 77.6  Vital Signs: Temp: 97.7 F (36.5 C) (05/28 1543) Temp Source: Oral (05/28 1543) BP: 134/76 mmHg (05/28 1543) Pulse Rate: 64 (05/28 1543)  Labs:  Recent Labs  02/16/15 1250  HGB 11.5*  HCT 35.2*  PLT 224  APTT 39*  LABPROT 19.7*  INR 1.65  CREATININE 1.44*  TROPONINI 0.03    Estimated Creatinine Clearance: 86.6 mL/min (by C-G formula based on Cr of 1.44).   Medical History: Past Medical History  Diagnosis Date  . Cataract   . CHF (congestive heart failure)   . COPD (chronic obstructive pulmonary disease)   . Diabetes mellitus without complication   . Sleep apnea   . Oxygen deficiency   . Thyroid disease   . Gout   . Hypothyroidism   . CKD (chronic kidney disease) stage 3, GFR 30-59 ml/min     Medications:  Scheduled:  . aspirin EC  81 mg Oral Daily  . carvedilol  3.125 mg Oral BID WC  . colchicine  0.6 mg Oral Daily  . furosemide  40 mg Intravenous Q12H  . gabapentin  100 mg Oral BID  . heparin  5,000 Units Subcutaneous 3 times per day  . insulin aspart  0-5 Units Subcutaneous QHS  . insulin aspart  0-9 Units Subcutaneous TID WC  . insulin NPH Human  80 Units Subcutaneous BID AC  . insulin regular  10 Units Subcutaneous BID AC  . levothyroxine  100 mcg Oral Daily  . losartan  25 mg Oral Daily  . [START ON 02/18/2015] metolazone  2.5 mg Oral Once per day on Mon Wed Fri  . PARoxetine  20 mg Oral Daily  . potassium chloride  10 mEq Oral BID  . pravastatin  40 mg Oral QHS  . sodium chloride  3 mL Intravenous Q12H  . spironolactone  25 mg Oral Daily  . Warfarin - Pharmacist Dosing Inpatient   Does not  apply q1800    Assessment: 59 yo male here with acute CHF on Coumadin for AFib and hx of PE Per home med list PTA, warfarin 10 mg Mon/Fri and 5 mg TWThSS, last dose 5/28 at 6 am (regimen and timing of last dose verified with pt's wife)  Goal of Therapy:  INR 2-3 Monitor platelets by anticoagulation protocol: Yes   Plan:  Pt already took dose of warfarin today, will follow up INR in AM Of note pt also ordered SQ heparin for DVT ppx   Tyler FatHannah Liandra [name], PharmD, BCPS Clinical Pharmacist 02/16/2015,3:56 PM

## 2015-02-16 NOTE — ED Provider Notes (Signed)
[facility] Emergency Department Provider Note  ____________________________________________  Time seen: Approximately 1:40 PM  I have reviewed the triage vital signs and the nursing notes.   HISTORY  Chief Complaint Shortness of Breath    HPI Tyler [name] is a 59 y.o. male with history of CHF, COPD type 2 diabetes, chronic home oxygen requirement of 2 L who presents for evaluation of worsening shortness of breath and bilateral lower extremity swelling. This has been intermittent but  ongoing for the past 2 months with recent worsening over the past week. He denies any chest pain. Current severity is moderate. He reports 30 pound weight gain over the past 2 months. Shortness of breath is worse when he attempts to lie down/lie flat. He is also had cough but no fevers, no vomiting or diarrhea.   Past Medical History  Diagnosis Date  . Cataract   . CHF (congestive heart failure)   . COPD (chronic obstructive pulmonary disease)   . Diabetes mellitus without complication   . Sleep apnea   . Oxygen deficiency   . Thyroid disease   . Gout     Patient Active Problem List   Diagnosis Date Noted  . Acute CHF 02/16/2015    Past Surgical History  Procedure Laterality Date  . Cholecystectomy      2002  . Eye surgery      right cataract removed 3/21  . Vena cava filter placement      Current Outpatient Rx  Name  Route  Sig  Dispense  Refill  . colchicine 0.6 MG tablet      TAKE ONE TABLET BY MOUTH TWICE DAILY AS NEEDED FOR  WRIST  PAIN         . Cyanocobalamin (VITAMIN B-12) 1000 MCG SUBL   Oral   Take by mouth.         . gabapentin (NEURONTIN) 100 MG capsule   Oral   Take by mouth.         . insulin NPH Human (HUMULIN N,NOVOLIN N) 100 UNIT/ML injection      90 units 30 min prior to breakfast, 90 units 30 min prior to supper         . insulin regular (NOVOLIN R,HUMULIN R) 100 units/mL injection      10 units 30 min prior to meal  if BG >120 & eating         . levothyroxine (SYNTHROID, LEVOTHROID) 100 MCG tablet   Oral   Take by mouth.         . metolazone (ZAROXOLYN) 5 MG tablet   Oral   Take by mouth.         . nystatin (MYCOSTATIN) powder   Topical   Apply topically.         Marland Kitchen omeprazole (PRILOSEC OTC) 20 MG tablet   Oral   Take 20 mg by mouth daily.         Marland Kitchen PARoxetine (PAXIL) 20 MG tablet   Oral   Take by mouth.         . potassium chloride (K-DUR,KLOR-CON) 10 MEQ tablet      TAKE TWO TABLETS BY MOUTH ONCE DAILY         . pravastatin (PRAVACHOL) 40 MG tablet   Oral   Take by mouth.         . spironolactone (ALDACTONE) 25 MG tablet   Oral   Take by mouth.         Marland Kitchen  torsemide (DEMADEX) 20 MG tablet   Oral   Take by mouth.         . warfarin (COUMADIN) 10 MG tablet   Oral   Take 10 mg by mouth daily. Pt states 1/2 tablet Monday-Wednesday-Friday.   Whole tablet Tuesday, Thursday,Saturday           Allergies Lovastatin  Family History  Problem Relation Age of Onset  . Diabetes Sister   . Diabetes Brother     Social History History  Substance Use Topics  . Smoking status: Current Every Day Smoker    Types: Cigars  . Smokeless tobacco: Never Used  . Alcohol Use: No    Review of Systems Constitutional: No fever/chills Eyes: No visual changes. ENT: No sore throat. Cardiovascular: Denies chest pain. Respiratory: +shortness of breath. Gastrointestinal: + abdominal swelling.  No nausea, no vomiting.  No diarrhea.  No constipation. Genitourinary: Negative for dysuria. Musculoskeletal: Negative for back pain. Skin: Negative for rash. Neurological: Negative for headaches, focal weakness or numbness.  10-point ROS otherwise negative.  ____________________________________________   PHYSICAL EXAM:  VITAL SIGNS: ED Triage Vitals  Enc Vitals Group     BP 02/16/15 1225 124/70 mmHg     Pulse Rate 02/16/15 1225 61     Resp 02/16/15 1225 18     Temp  02/16/15 1225 98.2 F (36.8 C)     Temp Source 02/16/15 1225 Oral     SpO2 02/16/15 1225 90 %     Weight 02/16/15 1225 354 lb 9 oz (160.828 kg)     Height 02/16/15 1225 6' (1.829 m)     Head Cir --      Peak Flow --      Pain Score 02/16/15 1225 8     Pain Loc --      Pain Edu? --      Excl. in GC? --     Constitutional: Alert and oriented. Chronically ill- appearing but in no acute distress. Eyes: Conjunctivae are normal. PERRL. EOMI. Head: Atraumatic. Nose: No congestion/rhinnorhea. Mouth/Throat: Mucous membranes are moist.  Oropharynx non-erythematous. Neck: No stridor. Cardiovascular: bradycardic rate, irregularly irregular rhythm. Grossly normal heart sounds.  Good peripheral circulation. Respiratory: Mild increased work of breathing, becomes short of breath with even the most minimal exertion/change in position, slightly diminished breath sounds throughout Gastrointestinal: Obese abdomen is protuberant but Soft and nontender. No distention. No abdominal bruits. No CVA tenderness. Genitourinary: deferred Musculoskeletal: 4+ pitting edema bilateral lower extremities  Neurologic:  Normal speech and language. No gross focal neurologic deficits are appreciated. Speech is normal. No gait instability. Skin:  Skin is warm, dry and intact. No rash noted. Psychiatric: Mood and affect are normal. Speech and behavior are normal.  ____________________________________________   LABS (all labs ordered are listed, but only abnormal results are displayed)  Labs Reviewed  CBC WITH DIFFERENTIAL/PLATELET - Abnormal; Notable for the following:    RBC 4.21 (*)    Hemoglobin 11.5 (*)    HCT 35.2 (*)    RDW 17.4 (*)    Lymphs Abs 0.9 (*)    All other components within normal limits  COMPREHENSIVE METABOLIC PANEL - Abnormal; Notable for the following:    Sodium 132 (*)    Chloride 89 (*)    Glucose, Bld 280 (*)    BUN 29 (*)    Creatinine, Ser 1.44 (*)    Calcium 8.5 (*)    Albumin 2.7  (*)    ALT 10 (*)  Alkaline Phosphatase 243 (*)    GFR calc non Af Amer 52 (*)    GFR calc Af Amer 60 (*)    All other components within normal limits  BRAIN NATRIURETIC PEPTIDE - Abnormal; Notable for the following:    B Natriuretic Peptide 486.0 (*)    All other components within normal limits  TROPONIN I  APTT  PROTIME-INR   ____________________________________________  EKG  ED ECG REPORT I, Gayla DossGayle, Reynolds Kittel A, the attending physician, personally viewed and interpreted this ECG.   Date: 02/16/2015  EKG Time: 12:36  Rate: 56  Rhythm: atrial fibrillation, rate 56  Axis: normal  Intervals: Suspect atrial fibrillation versus junctional rhythm versus sinus rhythm with second-degree type I AV block  ST&T Change: No acute ST segment elevation  ____________________________________________  RADIOLOGY  CXR  IMPRESSION: Findings suggest congestive heart failure with mild pulmonary edema. Retrocardiac portion of left lower lobe, which is extensive given the cardiac enlargement, not evaluated on AP film. ____________________________________________   PROCEDURES  Procedure(s) performed: None  Critical Care performed: No  ____________________________________________   INITIAL IMPRESSION / ASSESSMENT AND PLAN / ED COURSE  Pertinent labs & imaging results that were available during my care of the patient were reviewed by me and considered in my medical decision making (see chart for details).  Pauline AusMichael A Akhavan is a 59 y.o. male with history of CHF, COPD type 2 diabetes, chronic home oxygen requirement of 2 L who presents for evaluation of worsening shortness of breath and bilateral lower extremity swelling. Clinical picture is most consistent with CHF exacerbation given BNP elevation as well as pulmonary edema on chest x-ray. He is also mildly hypoxic and has increased oxygen requirement. We'll give 40 mg of IV Lasix. EKG was reviewed by and discussed with Dr. Darrold JunkerParaschos who  believes that the rhythm is not consistent with AV block but is suspicious for either underlying atrial fibrillation or junctional rhythm or sinus rhythm with second-degree type I AV block. Patient has stable blood pressure. Discussed with hospitalist for admission. ____________________________________________   FINAL CLINICAL IMPRESSION(S) / ED DIAGNOSES  Final diagnoses:  Acute exacerbation of CHF (congestive heart failure)      Gayla DossEryka A Michaelann Gunnoe, MD 02/16/15 1354

## 2015-02-16 NOTE — H&P (Addendum)
[facility] - [place] at [facility]   PATIENT NAME: Tyler [name]    MR#:  [id number]  DATE OF BIRTH:  15-Feb-1956  DATE OF ADMISSION:  02/16/2015  PRIMARY CARE PHYSICIAN: Mickey Farber, MD   REQUESTING/REFERRING PHYSICIAN: Dr. Inocencio Homes  CHIEF COMPLAINT:   Chief Complaint  Patient presents with  . Shortness of Breath  Shortness of breath for 2 days  HISTORY OF PRESENT ILLNESS:  Tyler [name]  is a 59 y.o. male with a known history of chronic respiratory failure on home oxygen 2.5 L, COPD, OSA, diabetes 2, PE, CKD stage III and GI bleeding. Patient came to ED due to cough, sputum and shortness breath for the past 2 days. The patient has chronic respiratory failure and chronic cough and shortness of breath. However, the patient has had worsening shortness of breath, cough with sputum and worsening bilateral leg swelling , and abdominal swelling. In addition,  patient in body weight increased. He denies any fever or chills. He denies any chest pain or palpitation but has a orthopnea and nocturnal dyspnea.  PAST MEDICAL HISTORY:   Past Medical History  Diagnosis Date  . Cataract   . CHF (congestive heart failure)   . COPD (chronic obstructive pulmonary disease)   . Diabetes mellitus without complication   . Sleep apnea   . Oxygen deficiency   . Thyroid disease   . Gout     PAST SURGICAL HISTORY:   Past Surgical History  Procedure Laterality Date  . Cholecystectomy      2002  . Eye surgery      right cataract removed 3/21  . Vena cava filter placement      SOCIAL HISTORY:   History  Substance Use Topics  . Smoking status: Current Every Day Smoker    Types: Cigars  . Smokeless tobacco: Never Used  . Alcohol Use: No  He is a using cigar every week  FAMILY HISTORY:   Family History  Problem Relation Age of Onset  . Diabetes Sister   . Diabetes Brother     DRUG ALLERGIES:   Allergies  Allergen Reactions  . Enalapril Cough  .  Lovastatin Other (See Comments)    tachycardia  . Atorvastatin Palpitations    REVIEW OF SYSTEMS:  CONSTITUTIONAL: No fever or chills, but has a generalized weakness.  EYES: No blurred or double vision.  EARS, NOSE, AND THROAT: No tinnitus or ear pain.  RESPIRATORY:Positive for  cough, shortness of breath,  but no wheezing or hemoptysis.  CARDIOVASCULAR: No chest pain,  positive for orthopnea,nocturnal dyspnea and leg  edema.  GASTROINTESTINAL: No nausea, vomiting, diarrhea or abdominal pain.  GENITOURINARY: No dysuria, hematuria.  ENDOCRINE: No polyuria, nocturia,  HEMATOLOGY: No anemia, easy bruising or bleeding SKIN: No rash or lesion. MUSCULOSKELETAL: No joint pain or arthritis.   NEUROLOGIC: No tingling, numbness, weakness.  PSYCHIATRY: No anxiety or depression.   MEDICATIONS AT HOME:   Prior to Admission medications   Medication Sig Start Date End Date Taking? Authorizing Provider  colchicine 0.6 MG tablet 0.6 mg 2 (two) times daily. TAKE ONE TABLET BY MOUTH TWICE DAILY AS NEEDED FOR  WRIST  PAIN 12/06/14  Yes Historical Provider, MD  cyanocobalamin (,VITAMIN B-12,) 1000 MCG/ML injection Inject 1,000 mcg into the muscle every 30 (thirty) days.   Yes Historical Provider, MD  gabapentin (NEURONTIN) 100 MG capsule Take 100 mg by mouth 2 (two) times daily.  10/23/14  Yes Historical Provider, MD  insulin NPH Human (HUMULIN  N,NOVOLIN N) 100 UNIT/ML injection Inject 80 Units into the skin 2 (two) times daily before a meal. 80 units subcutaneous 30 min prior to breakfast, 80 units subcutaneous 30 min prior to supper. 10/10/14  Yes Historical Provider, MD  insulin regular (NOVOLIN R,HUMULIN R) 100 units/mL injection Inject 10 Units into the skin 2 (two) times daily before a meal. 10 units 30 min prior to meal if BG >120 & eating 07/10/14  Yes Historical Provider, MD  lactulose (CHRONULAC) 10 GM/15ML solution Take 30 g by mouth 2 (two) times daily as needed for mild constipation or moderate  constipation.   Yes Historical Provider, MD  levothyroxine (SYNTHROID, LEVOTHROID) 100 MCG tablet Take 100 mcg by mouth daily. Patient takes this medication at 6am. 08/02/14 08/02/15 Yes Historical Provider, MD  metolazone (ZAROXOLYN) 2.5 MG tablet Take 2.5 mg by mouth 3 (three) times a week. Patient takes orally on Monday, Wednesday, and Friday.   Yes Historical Provider, MD  nystatin (MYCOSTATIN) powder Apply 1 g topically daily as needed (for rash.).  11/09/14 11/09/15 Yes Historical Provider, MD  PARoxetine (PAXIL) 20 MG tablet Take 20 mg by mouth daily.  08/02/14  Yes Historical Provider, MD  potassium chloride (K-DUR,KLOR-CON) 10 MEQ tablet Take 20 mEq by mouth daily. TAKE TWO TABLETS BY MOUTH ONCE DAILY 11/22/14  Yes Historical Provider, MD  pravastatin (PRAVACHOL) 40 MG tablet Take 40 mg by mouth at bedtime.  08/02/14  Yes Historical Provider, MD  spironolactone (ALDACTONE) 25 MG tablet Take 25 mg by mouth daily.  11/09/14 11/09/15 Yes Historical Provider, MD  Tiotropium Bromide-Olodaterol 2.5-2.5 MCG/ACT AERS Inhale 1 spray into the lungs daily as needed (for wheezing and shortness of breath.).   Yes Historical Provider, MD  torsemide (DEMADEX) 20 MG tablet Take 20 mg by mouth 2 (two) times daily.  09/18/14 09/18/15 Yes Historical Provider, MD  vitamin B-12 (CYANOCOBALAMIN) 1000 MCG tablet Take 2,000 mcg by mouth daily.   Yes Historical Provider, MD  warfarin (COUMADIN) 10 MG tablet Take 5-10 mg by mouth daily. Pt states 1 tablet orally Monday and Friday.  Take 1/2 tablet ( ) orally Tuesday, Wednesday, Thursday, Saturday, and Sunday.   Yes Historical Provider, MD      VITAL SIGNS:  Blood pressure 115/73, pulse 66, temperature 98.2 F (36.8 C), temperature source Oral, resp. rate 20, height 6' (1.829 m), weight 160.828 kg (354 lb 9 oz), SpO2 95 %.  PHYSICAL EXAMINATION:  GENERAL:  59 y.o.-year-old patient lying in the bed with no acute distress.  EYES: Pupils equal, round, reactive to light  and accommodation. No scleral icterus. Extraocular muscles intact.  HEENT: Head atraumatic, normocephalic. Oropharynx and nasopharynx clear.  NECK:  Supple, no jugular venous distention. No thyroid enlargement, no tenderness.  LUNGS: Normal breath sounds bilaterally,mild  wheezing, bilateral basilar  Rales. No use of accessory muscles of respiration.  CARDIOVASCULAR: S1, S2 normal. No murmurs, rubs, or gallops.  ABDOMEN: Soft, nontender,but distended and the swelling. Bowel sounds present.It is a difficult to estimate whether the that the patient has  organomegaly or mass due to abdominal distention.  EXTREMITIES:The left lower lower extremity edema 2+, no cyanosis, or clubbing.  no calf tenderness. NEUROLOGIC: Cranial nerves II through XII are intact. Muscle strength 5/5 in all extremities. Sensation intact. Gait not checked.  PSYCHIATRIC: The patient is alert and oriented x 3.  SKIN: No obvious rash, lesion, or ulcer.   LABORATORY PANEL:   CBC  Recent Labs Lab 02/16/15 1250  WBC 6.4  HGB 11.5*  HCT 35.2*  PLT 224   ------------------------------------------------------------------------------------------------------------------  Chemistries   Recent Labs Lab 02/16/15 1250  NA 132*  K 3.6  CL 89*  CO2 29  GLUCOSE 280*  BUN 29*  CREATININE 1.44*  CALCIUM 8.5*  AST 23  ALT 10*  ALKPHOS 243*  BILITOT 0.6   ------------------------------------------------------------------------------------------------------------------  Cardiac Enzymes  Recent Labs Lab 02/16/15 1250  TROPONINI 0.03   ------------------------------------------------------------------------------------------------------------------  RADIOLOGY:  Dg Chest Portable 1 View  02/16/2015   CLINICAL DATA:  Lower extremity swelling bilaterally with shortness of breath weakness and known congestive heart failure  EXAM: PORTABLE CHEST - 1 VIEW  COMPARISON:  10/30/2014  FINDINGS: Moderate enlargement of  cardiac silhouette stable. There is central vascular congestion. There is peribronchial cuffing. Retrocardiac area of the left lung not well evaluated. No focal consolidation on the right. Mild interstitial prominence.  IMPRESSION: Findings suggest congestive heart failure with mild pulmonary edema. Retrocardiac portion of left lower lobe, which is extensive given the cardiac enlargement, not evaluated on AP film.   Electronically Signed   By: Esperanza Heiraymond  Rubner M.D.   On: 02/16/2015 13:06    EKG:  No orders found for this or any previous visit.  IMPRESSION AND PLAN:   Acute CHF Hyponatremia A. fib History of PE CKD stage III COPD Chronic respiratory failure on home oxygen. Diabetes OSA Tobacco abuse  The patient will be admitted to telemetry floor. I will start a CHF protocol, hold torsemide and start on Lasix 40 mg IV twice a day. I will request a cardiology consult and get a echocardiograph. I will continue losartan, Coreg,  spironolactone and metolazone. For A. fib and a history of PE, I will continue Coumadin dose per pharmacy. For hyponatremia and the CKD, we need to closely monitor BMP. Chronic respiratory failure with COPD, stable. Continue nebulizer treatment. Continue home oxygen 2.5 L by nasal cannula, DuoNeb when necessary and Spiriva. For diabetes, start sliding scale, check hemoglobin A1c and continue patient's home NPH. Smoking cessation was counseled for 3 minutes and will give nicotine patch.  All the records are reviewed and case discussed with ED provider. Management plans discussed with the patient,  the patient's wife and they are in agreement.  CODE STATUS:  Full code TOTAL TIME TAKING CARE OF THIS PATIENT: 65 minutes.    Shaune Pollackhen, Alithia Zavaleta M.D on 02/16/2015 at 3:17 PM  Between 7am to 6pm - Pager - [phone]  After 6pm go to [email or web address] - password [email or web address] [facility]  [facility]  Office  [phone]  CC: Primary care physician; Mickey FarberHIES, DAVID,  MD

## 2015-02-16 NOTE — ED Notes (Signed)
Lab notified of add on test.   

## 2015-02-16 NOTE — Progress Notes (Signed)
Pt. admitted to unit. Oriented to room, call bell, Ascom phones and staff. Bed in low position. Fall safety plan reviewed, non-skid socks on, pt refuses bed alarm. Showing sinus arrhythmia with 1st degree HB on tele. Full assessment to Epic. Will continue to monitor.

## 2015-02-16 NOTE — Progress Notes (Signed)
*  PRELIMINARY RESULTS* Echocardiogram 2D Echocardiogram has been performed.  Tyler [name] 02/16/2015, 5:56 PM

## 2015-02-17 LAB — BASIC METABOLIC PANEL
Anion gap: 9 (ref 5–15)
BUN: 29 mg/dL — ABNORMAL HIGH (ref 6–20)
CALCIUM: 8.4 mg/dL — AB (ref 8.9–10.3)
CO2: 29 mmol/L (ref 22–32)
Chloride: 95 mmol/L — ABNORMAL LOW (ref 101–111)
Creatinine, Ser: 1.49 mg/dL — ABNORMAL HIGH (ref 0.61–1.24)
GFR calc non Af Amer: 50 mL/min — ABNORMAL LOW (ref >60)
GFR, EST AFRICAN AMERICAN: 58 mL/min — AB (ref >60)
Glucose, Bld: 188 mg/dL — ABNORMAL HIGH (ref 65–99)
Potassium: 3.5 mmol/L (ref 3.5–5.1)
Sodium: 133 mmol/L — ABNORMAL LOW (ref 135–145)

## 2015-02-17 LAB — CBC
HEMATOCRIT: 32.8 % — AB (ref 40.0–52.0)
Hemoglobin: 10.9 g/dL — ABNORMAL LOW (ref 13.0–18.0)
MCH: 27.7 pg (ref 26.0–34.0)
MCHC: 33.4 g/dL (ref 32.0–36.0)
MCV: 83 fL (ref 80.0–100.0)
Platelets: 210 10*3/uL (ref 150–440)
RBC: 3.96 MIL/uL — ABNORMAL LOW (ref 4.40–5.90)
RDW: 17 % — AB (ref 11.5–14.5)
WBC: 5.4 10*3/uL (ref 3.8–10.6)

## 2015-02-17 LAB — GLUCOSE, CAPILLARY
GLUCOSE-CAPILLARY: 205 mg/dL — AB (ref 65–99)
GLUCOSE-CAPILLARY: 338 mg/dL — AB (ref 65–99)
Glucose-Capillary: 213 mg/dL — ABNORMAL HIGH (ref 65–99)
Glucose-Capillary: 229 mg/dL — ABNORMAL HIGH (ref 65–99)

## 2015-02-17 LAB — HEMOGLOBIN A1C: Hgb A1c MFr Bld: 7.6 % — ABNORMAL HIGH (ref 4.0–6.0)

## 2015-02-17 LAB — PROTIME-INR
INR: 1.65
PROTHROMBIN TIME: 19.7 s — AB (ref 11.4–15.0)

## 2015-02-17 LAB — TROPONIN I: TROPONIN I: 0.03 ng/mL (ref <0.031)

## 2015-02-17 MED ORDER — WARFARIN SODIUM 2.5 MG PO TABS
5.0000 mg | ORAL_TABLET | Freq: Every day | ORAL | Status: DC
  Administered 2015-02-17: 5 mg via ORAL
  Filled 2015-02-17: qty 2

## 2015-02-17 MED ORDER — IPRATROPIUM-ALBUTEROL 0.5-2.5 (3) MG/3ML IN SOLN
3.0000 mL | RESPIRATORY_TRACT | Status: DC
  Administered 2015-02-17 – 2015-02-18 (×7): 3 mL via RESPIRATORY_TRACT
  Filled 2015-02-17 (×7): qty 3

## 2015-02-17 MED ORDER — METHYLPREDNISOLONE SODIUM SUCC 40 MG IJ SOLR
40.0000 mg | Freq: Two times a day (BID) | INTRAMUSCULAR | Status: DC
  Administered 2015-02-17 – 2015-02-18 (×3): 40 mg via INTRAVENOUS
  Filled 2015-02-17 (×4): qty 1

## 2015-02-17 NOTE — Care Management (Signed)
Home O2 but doesn't know name of agency. Lives with wife. His PCP is [facility]. He denies need for home health services.

## 2015-02-17 NOTE — Progress Notes (Signed)
Patient very agitated due to the fact that he's not getting his NPH insulin while in the hospital. Patient refused some of her medications but accepted the heparin shot after the RN explained the indications of it. The RN was called to the room by the patient's wife stated, the patient might be having some reactions to the heparin medication. The RN went to the room and  witnessed the patient holding onto his abdomen and complaining of pain. The RN asked the patient to see if he  would try to take pain medication to ease off the pain.The patient stated that he would like to go home and the RN was trying to tell the patient that she will call MD to come in and talk to him (patient) when he  started screening at the RN to get out of his room. The wife  tried to calm the patient down and the RN left the room to call Dr. Clent RidgesWalsh. Dr. Clent RidgesWalsh came in to talk to the patient and the patient agreed to stay. DR. Clent RidgesWalsh also ordered Ambien 5 mg PRN for the patient. The patient indicated to Dr. Clent RidgesWalsh that he was sorry he screemed at the RN. The nurse went in to talk to the patient and the patient agreed to take the Ambien. Patient now resting with no complaint.

## 2015-02-17 NOTE — Progress Notes (Signed)
[facility] - [place] [facility]   PATIENT NAME: Tyler [name]    MR#:  [id number]  DATE OF BIRTH:  1956/03/07  SUBJECTIVE:  CHIEF COMPLAINT:   Chief Complaint  Patient presents with  . Shortness of Breath   Patient here due to generalized weakness, shortness of breath. Still feels somewhat short of breath but improved.  Shortness of breath mostly on exertion  REVIEW OF SYSTEMS:    Review of Systems  Constitutional: Negative for fever and chills.  HENT: Negative for congestion and tinnitus.   Eyes: Negative for blurred vision and double vision.  Respiratory: Positive for cough, shortness of breath (improved) and wheezing.   Cardiovascular: Negative for chest pain, orthopnea and PND.  Gastrointestinal: Negative for nausea, vomiting, abdominal pain and diarrhea.  Genitourinary: Negative for dysuria and hematuria.  Skin: Negative for rash.  Neurological: Positive for weakness (generalized). Negative for dizziness, sensory change and focal weakness.  All other systems reviewed and are negative.   Nutrition: Heart Healthy Tolerating Diet: Yes   DRUG ALLERGIES:   Allergies  Allergen Reactions  . Enalapril Cough  . Lovastatin Other (See Comments)    tachycardia  . Atorvastatin Palpitations    VITALS:  Blood pressure 105/52, pulse 72, temperature 98.1 F (36.7 C), temperature source Oral, resp. rate 20, height 6' (1.829 m), weight 157.806 kg (347 lb 14.4 oz), SpO2 95 %.  PHYSICAL EXAMINATION:   Physical Exam  GENERAL:  59 y.o.-year-old obese male sitting up in bed with no acute distress.  EYES: Pupils equal, round, reactive to light and accommodation. No scleral icterus. Extraocular muscles intact.  HEENT: Head atraumatic, normocephalic. Oropharynx and nasopharynx clear.  NECK:  Supple, no jugular venous distention. No thyroid enlargement, no tenderness.  LUNGS: diffuse wheezing/rhonchi b/l.  No use of accessory muscles, no dullness to  percussion CARDIOVASCULAR: S1, S2 normal. No murmurs, rubs, gallops or clicks.  ABDOMEN: Soft, nontender, nondistended. Bowel sounds present. No organomegaly or mass.  EXTREMITIES: No cyanosis, clubbing, + 1 to 2 pitting edema from the knees to the ankles bilaterally. Signs of chronic venous stasis on lower extremities bilaterally. NEUROLOGIC: Cranial nerves II through XII are intact. No focal Motor or sensory deficits b/l. Globally weak  PSYCHIATRIC: The patient is alert and oriented x 3. Good affect SKIN: No obvious rash, lesion, or ulcer.    LABORATORY PANEL:   CBC  Recent Labs Lab 02/17/15 0420  WBC 5.4  HGB 10.9*  HCT 32.8*  PLT 210   ------------------------------------------------------------------------------------------------------------------  Chemistries   Recent Labs Lab 02/16/15 1250 02/16/15 1610 02/17/15 0420  NA 132*  --  133*  K 3.6  --  3.5  CL 89*  --  95*  CO2 29  --  29  GLUCOSE 280*  --  188*  BUN 29*  --  29*  CREATININE 1.44*  --  1.49*  CALCIUM 8.5*  --  8.4*  MG  --  1.8  --   AST 23  --   --   ALT 10*  --   --   ALKPHOS 243*  --   --   BILITOT 0.6  --   --    ------------------------------------------------------------------------------------------------------------------  Cardiac Enzymes  Recent Labs Lab 02/17/15 0420  TROPONINI 0.03   ------------------------------------------------------------------------------------------------------------------  RADIOLOGY:  Dg Chest Portable 1 View  02/16/2015   CLINICAL DATA:  Lower extremity swelling bilaterally with shortness of breath weakness and known congestive heart failure  EXAM: PORTABLE CHEST - 1 VIEW  COMPARISON:  10/30/2014  FINDINGS: Moderate enlargement of cardiac silhouette stable. There is central vascular congestion. There is peribronchial cuffing. Retrocardiac area of the left lung not well evaluated. No focal consolidation on the right. Mild interstitial prominence.   IMPRESSION: Findings suggest congestive heart failure with mild pulmonary edema. Retrocardiac portion of left lower lobe, which is extensive given the cardiac enlargement, not evaluated on AP film.   Electronically Signed   By: Esperanza Heiraymond  Rubner M.D.   On: 02/16/2015 13:06     ASSESSMENT AND PLAN:   59 year old male with past medical history of obstructive sleep apnea, pulmonary hypertension, chronic diastolic CHF, morbid obesity, type 2 diabetes, history of previous DVT and PE, hypothyroidism, gout, COPD who presented to the hospital due to shortness of breath cough and bilateral lower extremity swelling and noted to be in CHF and also mild COPD exacerbation.  #1 acute on chronic diastolic CHF-this is likely secondary to patient's medical noncompliance for sleep apnea. -We'll continue diuresis with IV Lasix, follow I's and O's and daily weights. -Continue Coreg, losartan. -Appreciate cardiology input and continue current medical management as mentioned above - Echocardiogram has been done shows normal ejection fraction of 55-60% and moderate pulmonary hypertension  #2 COPD-with mild exacerbation. -We'll start the patient on IV steroids, DuoNeb nebs every 4 hours, continue Spiriva - Patient already on oxygen at home  #3 history of previous DVT and PE-patient is on warfarin INR is subtherapeutic -Continue dosing Coumadin per pharmacy  #4 anxiety-continue Paxil  #5 hyperlipidemia-continue Pravachol.  #6 diabetes type 2 without complication-continue Levemir and sliding scale insulin - Follow blood sugars  #7 history of gout-no acute attack. Continue colchicine  #8 diabetic neuropathy-continue Neurontin.  #9 hypothyroidism-continue Synthroid.  All the records are reviewed and case discussed with Care Management/Social Workerr. Management plans discussed with the patient, family and they are in agreement.  CODE STATUS: Full  DVT Prophylaxis: Warfarin  TOTAL TIME TAKING CARE OF THIS  PATIENT: 30 minutes.   POSSIBLE D/C IN 1-2 DAYS, DEPENDING ON CLINICAL CONDITION.   Houston SirenSAINANI,Lillias Difrancesco J M.D on 02/17/2015 at 11:14 AM  Between 7am to 6pm - Pager - [phone]  After 6pm go to [email or web address] - password [email or web address] [facility]  [facility]  Office  [phone]  CC: Primary care physician; Mickey FarberHIES, DAVID, MD

## 2015-02-17 NOTE — Progress Notes (Signed)
Patient refused his potassium 10 mEq as well as  4 units of insulin per sliding scale with CBG of 338 mg/dL. Patient asked the RN how much insulin he was receiving and the RN informed the patient he was getting 4 units. The patient started using profane words which the RN does not want to express them in writing.  The  patient added that he was going to call his wife to bring his insulin from home to use.  Concerned about the patient safety, the RN notified the supervisor as well as Dr. Anne HahnWillis.  No new order received  from Dr. Anne HahnWillis but he indicated the RN should talk to the wife (if she brings the insulin) to take the medication to the pharmacy to get it labelled.

## 2015-02-17 NOTE — Consult Note (Signed)
[facility] Cardiology  CARDIOLOGY CONSULT NOTE  Patient ID: Tyler [name] MRN: [id number] DOB/AGE: March 27, 1956 59 y.o.  Admit date: 02/16/2015 Referring Physician Shaune Pollack M.D. Primary Physician Hal Morales M.D. Primary Cardiologist Arnoldo Hooker M.D. Reason for Consultation acute on chronic diastolic congestive heart failure  HPI: 59 year old gentleman with history of chronic diastolic congestive heart failure, systemic hypertension, pulmonary hypertension and COPD referred for evaluation of ST shortness of breath, peripheral edema and acute on chronic diastolic congestive heart failure. The patient reports that approximately 2 months ago he had similar symptoms with this have shortness of breath and fluid retention that resolved after diuresis. The patient reports that he has been experiencing worsening peripheral edema, fluid retention, and progressive exertional dyspnea. He presents to [facility] emergency room admission labs were notable for negative troponin. EKG was nondiagnostic. Patient's been treated with intravenous furosemide with prompt diuresis and clinical improvement. The patient denies chest pain. Patient reports he has been compliant with his medications. He has not been using his CPAP for sleep apnea. The patient is sedentary, not very active, and does not exercise regularly.  Review of systems complete and found to be negative unless listed above     Past Medical History  Diagnosis Date  . Cataract   . CHF (congestive heart failure)   . COPD (chronic obstructive pulmonary disease)   . Diabetes mellitus without complication   . Sleep apnea   . Oxygen deficiency   . Thyroid disease   . Gout   . Hypothyroidism   . CKD (chronic kidney disease) stage 3, GFR 30-59 ml/min     Past Surgical History  Procedure Laterality Date  . Cholecystectomy      2002  . Eye surgery      right cataract removed 3/21  . Vena cava filter placement      Prescriptions prior to admission   Medication Sig Dispense Refill Last Dose  . colchicine 0.6 MG tablet 0.6 mg 2 (two) times daily. TAKE ONE TABLET BY MOUTH TWICE DAILY AS NEEDED FOR  WRIST  PAIN   02/14/2015  . cyanocobalamin (,VITAMIN B-12,) 1000 MCG/ML injection Inject 1,000 mcg into the muscle every 30 (thirty) days.   Past Month at Unknown time  . gabapentin (NEURONTIN) 100 MG capsule Take 100 mg by mouth 2 (two) times daily.    02/16/2015 at Unknown time  . insulin NPH Human (HUMULIN N,NOVOLIN N) 100 UNIT/ML injection Inject 80 Units into the skin 2 (two) times daily before a meal. 80 units subcutaneous 30 min prior to breakfast, 80 units subcutaneous 30 min prior to supper.   02/15/2015 at Unknown time  . insulin regular (NOVOLIN R,HUMULIN R) 100 units/mL injection Inject 10 Units into the skin 2 (two) times daily before a meal. 10 units 30 min prior to meal if BG >120 & eating   02/15/2015 at Unknown time  . lactulose (CHRONULAC) 10 GM/15ML solution Take 30 g by mouth 2 (two) times daily as needed for mild constipation or moderate constipation.   prn  . levothyroxine (SYNTHROID, LEVOTHROID) 100 MCG tablet Take 100 mcg by mouth daily. Patient takes this medication at 6am.   02/16/2015 at 0600  . metolazone (ZAROXOLYN) 2.5 MG tablet Take 2.5 mg by mouth 3 (three) times a week. Patient takes orally on Monday, Wednesday, and Friday.   02/15/2015 at Unknown time  . nystatin (MYCOSTATIN) powder Apply 1 g topically daily as needed (for rash.).    prn  . PARoxetine (PAXIL) 20 MG tablet  Take 20 mg by mouth daily.    02/16/2015 at Unknown time  . potassium chloride (K-DUR,KLOR-CON) 10 MEQ tablet Take 20 mEq by mouth daily. TAKE TWO TABLETS BY MOUTH ONCE DAILY   02/16/2015 at Unknown time  . pravastatin (PRAVACHOL) 40 MG tablet Take 40 mg by mouth at bedtime.    02/15/2015 at Unknown time  . spironolactone (ALDACTONE) 25 MG tablet Take 25 mg by mouth daily.    02/16/2015 at Unknown time  . Tiotropium Bromide-Olodaterol 2.5-2.5 MCG/ACT AERS Inhale  1 spray into the lungs daily as needed (for wheezing and shortness of breath.).   02/15/2015 at Unknown time  . torsemide (DEMADEX) 20 MG tablet Take 20 mg by mouth 2 (two) times daily.    02/16/2015 at Unknown time  . vitamin B-12 (CYANOCOBALAMIN) 1000 MCG tablet Take 2,000 mcg by mouth daily.   02/16/2015 at Unknown time  . warfarin (COUMADIN) 10 MG tablet Take 5-10 mg by mouth daily. Pt states 1 tablet orally Monday and Friday.  Take 1/2 tablet (5mg ) orally Tuesday, Wednesday, Thursday, Saturday, and Sunday.   02/16/2015 at Unknown time   History   Social History  . Marital Status: Married    Spouse Name: N/A  . Number of Children: N/A  . Years of Education: N/A   Occupational History  . Not on file.   Social History Main Topics  . Smoking status: Current Every Day Smoker    Types: Cigars  . Smokeless tobacco: Never Used  . Alcohol Use: No  . Drug Use: No  . Sexual Activity: Not on file   Other Topics Concern  . Not on file   Social History Narrative    Family History  Problem Relation Age of Onset  . Diabetes Sister   . Diabetes Brother       Review of systems complete and found to be negative unless listed above      PHYSICAL EXAM  General: Well developed, well nourished, in no acute distress HEENT:  Normocephalic and atramatic Neck:  No JVD.  Lungs: Clear bilaterally to auscultation and percussion. Heart: HRRR . Normal S1 and S2 without gallops or murmurs.  Abdomen: Bowel sounds are positive, abdomen soft and non-tender  Msk:  Back normal, normal gait. Normal strength and tone for age. Extremities: 2+ bilateral pedal edema.   Neuro: Alert and oriented X 3. Psych:  Good affect, responds appropriately  Labs:   Lab Results  Component Value Date   WBC 5.4 02/17/2015   HGB 10.9* 02/17/2015   HCT 32.8* 02/17/2015   MCV 83.0 02/17/2015   PLT 210 02/17/2015    Recent Labs Lab 02/16/15 1250 02/17/15 0420  NA 132* 133*  K 3.6 3.5  CL 89* 95*  CO2 29 29   BUN 29* 29*  CREATININE 1.44* 1.49*  CALCIUM 8.5* 8.4*  PROT 6.8  --   BILITOT 0.6  --   ALKPHOS 243*  --   ALT 10*  --   AST 23  --   GLUCOSE 280* 188*   Lab Results  Component Value Date   TROPONINI 0.03 02/17/2015   No results found for: CHOL No results found for: HDL No results found for: LDLCALC No results found for: TRIG No results found for: CHOLHDL No results found for: LDLDIRECT    Radiology: Dg Chest Portable 1 View  02/16/2015   CLINICAL DATA:  Lower extremity swelling bilaterally with shortness of breath weakness and known congestive heart failure  EXAM: PORTABLE CHEST -  1 VIEW  COMPARISON:  10/30/2014  FINDINGS: Moderate enlargement of cardiac silhouette stable. There is central vascular congestion. There is peribronchial cuffing. Retrocardiac area of the left lung not well evaluated. No focal consolidation on the right. Mild interstitial prominence.  IMPRESSION: Findings suggest congestive heart failure with mild pulmonary edema. Retrocardiac portion of left lower lobe, which is extensive given the cardiac enlargement, not evaluated on AP film.   Electronically Signed   By: Esperanza Heir M.D.   On: 02/16/2015 13:06    EKG: Sinus rhythm  ASSESSMENT AND PLAN:   1. Acute on chronic diastolic congestive heart failure. The patient has a history of obesity, systemic hypertension, obstructive sleep apnea and diastolic congestive heart failure. The patient is prone to fluid retention, peripheral edema and shortness of breath. She has been noncompliant with CPAP. The patient has responded to initial treatment with intravenous furosemide. 2. Essential hypertension, blood pressure under good control 3. Pulmonary hypertension, obstructive sleep apnea, noncompliant with CPAP  Recommendations  1. Agree with overall current therapy 2. Continue diuresis 3. Stressed the importance to the patient about compliance with medications, low sodium diet and CPAP 4. Review 2-D  echocardiogram  Signed: Nerida Boivin MD,PhD, [facility] 02/17/2015, 10:32 AM

## 2015-02-17 NOTE — Progress Notes (Signed)
ANTICOAGULATION CONSULT NOTE - Initial Consult  Pharmacy Consult for warfarin Indication: atrial fibrillation  Allergies  Allergen Reactions  . Enalapril Cough  . Lovastatin Other (See Comments)    tachycardia  . Atorvastatin Palpitations    Patient Measurements: Height: 6' (182.9 cm) Weight: (!) 347 lb 14.4 oz (157.806 kg) IBW/kg (Calculated) : 77.6  Vital Signs: Temp: 98.1 F (36.7 C) (05/29 0406) Temp Source: Oral (05/29 0406) BP: 105/52 mmHg (05/29 0408)  Labs:  Recent Labs  02/16/15 1250 02/16/15 1610 02/16/15 2131 02/17/15 0420  HGB 11.5*  --   --  10.9*  HCT 35.2*  --   --  32.8*  PLT 224  --   --  210  APTT 39*  --   --   --   LABPROT 19.7*  --   --  19.7*  INR 1.65  --   --  1.65  CREATININE 1.44*  --   --  1.49*  TROPONINI 0.03 0.03 0.03 0.03    Estimated Creatinine Clearance: 82.8 mL/min (by C-G formula based on Cr of 1.49).   Medical History: Past Medical History  Diagnosis Date  . Cataract   . CHF (congestive heart failure)   . COPD (chronic obstructive pulmonary disease)   . Diabetes mellitus without complication   . Sleep apnea   . Oxygen deficiency   . Thyroid disease   . Gout   . Hypothyroidism   . CKD (chronic kidney disease) stage 3, GFR 30-59 ml/min     Medications:  Scheduled:  . aspirin EC  81 mg Oral Daily  . carvedilol  3.125 mg Oral BID WC  . colchicine  0.6 mg Oral Daily  . furosemide  40 mg Intravenous Q12H  . gabapentin  100 mg Oral BID  . insulin aspart  0-5 Units Subcutaneous QHS  . insulin aspart  0-9 Units Subcutaneous TID WC  . insulin aspart  10 Units Subcutaneous BID AC  . insulin detemir  80 Units Subcutaneous BID AC  . levothyroxine  100 mcg Oral Daily  . losartan  25 mg Oral Daily  . [START ON 02/18/2015] metolazone  2.5 mg Oral Once per day on Mon Wed Fri  . nicotine  7 mg Transdermal Daily  . PARoxetine  20 mg Oral Daily  . potassium chloride  10 mEq Oral BID  . pravastatin  40 mg Oral QHS  . sodium  chloride  3 mL Intravenous Q12H  . spironolactone  25 mg Oral Daily  . warfarin  5 mg Oral Daily  . Warfarin - Pharmacist Dosing Inpatient   Does not apply q1800    Assessment: 59 yo male here with acute CHF on Coumadin for AFib and hx of PE Per home med list PTA, warfarin 10 mg Mon/Fri and 5 mg TWThSS, last dose 5/28 at 6 am (regimen and timing of last dose verified with pt's wife)  Goal of Therapy:  INR 2-3 Monitor platelets by anticoagulation protocol: Yes    Plan:  Will continue pt's home dose of Warfarin 5mg  daily at this time.  Will follow up INR in AM Of note pt also ordered SQ heparin for DVT ppx   Bari MantisKristin Jameil Whitmoyer PharmD Clinical Pharmacist 02/17/2015

## 2015-02-18 LAB — GLUCOSE, CAPILLARY
GLUCOSE-CAPILLARY: 200 mg/dL — AB (ref 65–99)
GLUCOSE-CAPILLARY: 186 mg/dL — AB (ref 65–99)
Glucose-Capillary: 252 mg/dL — ABNORMAL HIGH (ref 65–99)
Glucose-Capillary: 271 mg/dL — ABNORMAL HIGH (ref 65–99)

## 2015-02-18 LAB — BASIC METABOLIC PANEL
Anion gap: 12 (ref 5–15)
BUN: 35 mg/dL — ABNORMAL HIGH (ref 6–20)
CO2: 29 mmol/L (ref 22–32)
CREATININE: 1.73 mg/dL — AB (ref 0.61–1.24)
Calcium: 8.5 mg/dL — ABNORMAL LOW (ref 8.9–10.3)
Chloride: 92 mmol/L — ABNORMAL LOW (ref 101–111)
GFR calc non Af Amer: 41 mL/min — ABNORMAL LOW (ref >60)
GFR, EST AFRICAN AMERICAN: 48 mL/min — AB (ref >60)
Glucose, Bld: 224 mg/dL — ABNORMAL HIGH (ref 65–99)
Potassium: 3.5 mmol/L (ref 3.5–5.1)
Sodium: 133 mmol/L — ABNORMAL LOW (ref 135–145)

## 2015-02-18 LAB — PROTIME-INR
INR: 1.57
PROTHROMBIN TIME: 19 s — AB (ref 11.4–15.0)

## 2015-02-18 MED ORDER — INSULIN ASPART 100 UNIT/ML ~~LOC~~ SOLN
6.0000 [IU] | Freq: Once | SUBCUTANEOUS | Status: AC
  Administered 2015-02-18: 6 [IU] via SUBCUTANEOUS
  Filled 2015-02-18: qty 6

## 2015-02-18 MED ORDER — WARFARIN SODIUM 7.5 MG PO TABS
10.0000 mg | ORAL_TABLET | ORAL | Status: DC
  Administered 2015-02-18: 10 mg via ORAL
  Filled 2015-02-18: qty 1

## 2015-02-18 MED ORDER — WARFARIN SODIUM 2.5 MG PO TABS: 5.0000 mg | ORAL_TABLET | ORAL | Status: DC

## 2015-02-18 MED ORDER — INSULIN ASPART 100 UNIT/ML ~~LOC~~ SOLN: 9.0000 [IU] | Freq: Once | SUBCUTANEOUS | Status: DC

## 2015-02-18 MED ORDER — LOSARTAN POTASSIUM 25 MG PO TABS: 25.0000 mg | ORAL_TABLET | Freq: Every day | ORAL | Status: DC

## 2015-02-18 MED ORDER — PREDNISONE 10 MG PO TABS: 10.0000 mg | ORAL_TABLET | Freq: Every day | ORAL | Status: DC

## 2015-02-18 MED ORDER — INSULIN ASPART 100 UNIT/ML ~~LOC~~ SOLN
10.0000 [IU] | Freq: Three times a day (TID) | SUBCUTANEOUS | Status: DC
Start: 2015-02-18 — End: 2015-02-18
  Administered 2015-02-18: 10 [IU] via SUBCUTANEOUS
  Filled 2015-02-18 (×2): qty 10

## 2015-02-18 MED ORDER — CARVEDILOL 3.125 MG PO TABS: 3.1250 mg | ORAL_TABLET | Freq: Two times a day (BID) | ORAL | Status: DC

## 2015-02-18 MED ORDER — INSULIN REGULAR HUMAN 100 UNIT/ML IJ SOLN: 10.0000 [IU] | Freq: Three times a day (TID) | INTRAMUSCULAR | Status: DC

## 2015-02-18 NOTE — Discharge Instructions (Signed)
°  DIET:  Cardiac diet and Diabetic diet  DISCHARGE CONDITION:  Stable  ACTIVITY:  Activity as tolerated  OXYGEN:  Home Oxygen: Yes.     Oxygen Delivery: 2.5 liters/min via Patient connected to nasal cannula oxygen  DISCHARGE LOCATION:  home   If you experience worsening of your admission symptoms, develop shortness of breath, life threatening emergency, suicidal or homicidal thoughts you must seek medical attention immediately by calling 911 or calling your MD immediately  if symptoms less severe.  You Must read complete instructions/literature along with all the possible adverse reactions/side effects for all the Medicines you take and that have been prescribed to you. Take any new Medicines after you have completely understood and accpet all the possible adverse reactions/side effects.   Please note  You were cared for by a hospitalist during your hospital stay. If you have any questions about your discharge medications or the care you received while you were in the hospital after you are discharged, you can call the unit and asked to speak with the hospitalist on call if the hospitalist that took care of you is not available. Once you are discharged, your primary care physician will handle any further medical issues. Please note that NO REFILLS for any discharge medications will be authorized once you are discharged, as it is imperative that you return to your primary care physician (or establish a relationship with a primary care physician if you do not have one) for your aftercare needs so that they can reassess your need for medications and monitor your lab values.

## 2015-02-18 NOTE — Consult Note (Signed)
WOC wound consult note Reason for Consult: Ulcer to right dorsal foot, present on admission. Ulcer to left great toe present on admission,   Wound type: Edema to bilateral lower extremities, acute CHF Pressure Ulcer POA: Yes left great toe  Pressure and neuropathic Measurement:left toe  1 cm x 1 cm intact eschar Right dorsal foot  2cm x 4 cm x 0.5cm Wound bed:100% pale pink foot Eschar left great toe (tip) Drainage (amount, consistency, odor) Minimal serosanguinous right foot.  Periwound:Edema bilateral lower legs.  Acute CHF  Will not compress legs until diuresed and less SOB  Dressing procedure/placement/frequency:Cleanse right foot with NS and pat gently dry.  Gently fill wound with calcium alginate.  Cover with 4x4 gauze and secure with kerlix and tape.  Change daily.   Dry dressing to left great toe.  Change daily.  Will not follow at this time.  Please re-consult if needed.  Maple HudsonKaren Dhiren Azimi RN BSN CWON Pager [phone]

## 2015-02-18 NOTE — Discharge Summary (Addendum)
[facility] [place] [facility]   PATIENT NAME: Tyler [name]    MR#:  [id number]  DATE OF BIRTH:  1955-11-21  DATE OF ADMISSION:  02/16/2015 ADMITTING PHYSICIAN: Shaune PollackQing Chen, MD  DATE OF DISCHARGE: 02/18/2015  PRIMARY CARE PHYSICIAN: Mickey FarberHIES, DAVID, MD    ADMISSION DIAGNOSIS:  Acute exacerbation of CHF (congestive heart failure) [I50.9]  DISCHARGE DIAGNOSIS:  Principal Problem:   Acute CHF Active Problems:   Hyponatremia   A-fib   Chronic respiratory failure   SECONDARY DIAGNOSIS:   Past Medical History  Diagnosis Date  . Cataract   . CHF (congestive heart failure)   . COPD (chronic obstructive pulmonary disease)   . Diabetes mellitus without complication   . Sleep apnea   . Oxygen deficiency   . Thyroid disease   . Gout   . Hypothyroidism   . CKD (chronic kidney disease) stage 3, GFR 30-59 ml/min     HOSPITAL COURSE:   59 year old male with past medical history of obstructive sleep apnea, chronic diastolic CHF, COPD, type 2 diabetes, hypothyroidism, gout, chronic kidney disease stage III, who presented to the hospital due to shortness of breath and weakness and noted to be in acute respiratory failure.  #1 acute on chronic diastolic CHF-this is likely secondary to patient's medical noncompliance for sleep apnea.  Patient apparently is claustrophobic and therefore does not use his CPAP presently. - Patient was diuresed with IV Lasix, and he has improved. -Patient was seen by cardiology who agreed with this management, echocardiogram showed normal ejection fraction of 55-60% and moderate pulmonary hypertension. -Patient will continue some low-dose Coreg and losartan. Patient will follow-up with cardiology as an outpatient with Dr. Gwen PoundsKowalski.   #2 COPD with mild acute exacerbation. -Patient was started on some IV steroids, given duo nebs around-the-clock, maintained on Spiriva. -He has clinically improved and therefore is being discharged  on oral prednisone taper. -Patient is already on oxygen at home.  #3 history of previous DVT and PE-patient was maintained on his warfarin he will resume that.  #4 anxiety-patient was maintained on his Paxil and he will continue that.  #5 hyperlipidemia-patient was maintained on his Pravachol he will resume that.  #6 diabetes type 2 without complication-patient was on sliding scale insulin the hospital but will resume his NPH and regular insulin.  #7 gout-continue colchicine.  #8 diabetic neuropathy-continue Neurontin  #9 hypothyroidism-continue Synthroid  DISCHARGE CONDITIONS:   Stable  CONSULTS OBTAINED:  Treatment Team:  Lamar BlinksBruce J Kowalski, MD Marcina MillardAlexander Paraschos, MD  DRUG ALLERGIES:   Allergies  Allergen Reactions  . Enalapril Cough  . Lovastatin Other (See Comments)    tachycardia  . Atorvastatin Palpitations    DISCHARGE MEDICATIONS:   Current Discharge Medication List    START taking these medications   Details  carvedilol (COREG) 3.125 MG tablet Take 1 tablet (3.125 mg total) by mouth 2 (two) times daily with a meal. Qty: 60 tablet, Refills: 1    losartan (COZAAR) 25 MG tablet Take 1 tablet (25 mg total) by mouth daily. Qty: 30 tablet, Refills: 1    predniSONE (DELTASONE) 10 MG tablet Take 1 tablet (10 mg total) by mouth daily with breakfast. Qty: 15 tablet, Refills: 0      CONTINUE these medications which have NOT CHANGED   Details  colchicine 0.6 MG tablet 0.6 mg 2 (two) times daily. TAKE ONE TABLET BY MOUTH TWICE DAILY AS NEEDED FOR  WRIST  PAIN    cyanocobalamin (,VITAMIN B-12,) 1000 MCG/ML  injection Inject 1,000 mcg into the muscle every 30 (thirty) days.    gabapentin (NEURONTIN) 100 MG capsule Take 100 mg by mouth 2 (two) times daily.     insulin NPH Human (HUMULIN N,NOVOLIN N) 100 UNIT/ML injection Inject 80 Units into the skin 2 (two) times daily before a meal. 80 units subcutaneous 30 min prior to breakfast, 80 units subcutaneous 30 min  prior to supper.    insulin regular (NOVOLIN R,HUMULIN R) 100 units/mL injection Inject 10 Units into the skin 2 (two) times daily before a meal. 10 units 30 min prior to meal if BG >120 & eating    lactulose (CHRONULAC) 10 GM/15ML solution Take 30 g by mouth 2 (two) times daily as needed for mild constipation or moderate constipation.    levothyroxine (SYNTHROID, LEVOTHROID) 100 MCG tablet Take 100 mcg by mouth daily. Patient takes this medication at 6am.    metolazone (ZAROXOLYN) 2.5 MG tablet Take 2.5 mg by mouth 3 (three) times a week. Patient takes orally on Monday, Wednesday, and Friday.    nystatin (MYCOSTATIN) powder Apply 1 g topically daily as needed (for rash.).     PARoxetine (PAXIL) 20 MG tablet Take 20 mg by mouth daily.     potassium chloride (K-DUR,KLOR-CON) 10 MEQ tablet Take 20 mEq by mouth daily. TAKE TWO TABLETS BY MOUTH ONCE DAILY    pravastatin (PRAVACHOL) 40 MG tablet Take 40 mg by mouth at bedtime.     spironolactone (ALDACTONE) 25 MG tablet Take 25 mg by mouth daily.     Tiotropium Bromide-Olodaterol 2.5-2.5 MCG/ACT AERS Inhale 1 spray into the lungs daily as needed (for wheezing and shortness of breath.).    torsemide (DEMADEX) 20 MG tablet Take 20 mg by mouth 2 (two) times daily.     vitamin B-12 (CYANOCOBALAMIN) 1000 MCG tablet Take 2,000 mcg by mouth daily.    warfarin (COUMADIN) 10 MG tablet Take 5-10 mg by mouth daily. Pt states 1 tablet orally Monday and Friday.  Take 1/2 tablet ( ) orally Tuesday, Wednesday, Thursday, Saturday, and Sunday.         DISCHARGE INSTRUCTIONS:   DIET:  Cardiac diet and Diabetic diet  DISCHARGE CONDITION:  Stable  ACTIVITY:  Activity as tolerated  OXYGEN:  Home Oxygen: Yes.     Oxygen Delivery: 2.5 liters/min via Patient connected to nasal cannula oxygen  DISCHARGE LOCATION:  home   If you experience worsening of your admission symptoms, develop shortness of breath, life threatening emergency, suicidal or  homicidal thoughts you must seek medical attention immediately by calling 911 or calling your MD immediately  if symptoms less severe.  You Must read complete instructions/literature along with all the possible adverse reactions/side effects for all the Medicines you take and that have been prescribed to you. Take any new Medicines after you have completely understood and accpet all the possible adverse reactions/side effects.   Please note  You were cared for by a hospitalist during your hospital stay. If you have any questions about your discharge medications or the care you received while you were in the hospital after you are discharged, you can call the unit and asked to speak with the hospitalist on call if the hospitalist that took care of you is not available. Once you are discharged, your primary care physician will handle any further medical issues. Please note that NO REFILLS for any discharge medications will be authorized once you are discharged, as it is imperative that you return to your primary care  physician (or establish a relationship with a primary care physician if you do not have one) for your aftercare needs so that they can reassess your need for medications and monitor your lab values.     Today    VITAL SIGNS:  Blood pressure 112/52, pulse 84, temperature 98.3 F (36.8 C), temperature source Oral, resp. rate 20, height 6' (1.829 m), weight 156.9 kg (345 lb 14.4 oz), SpO2 93 %.  I/O:   Intake/Output Summary (Last 24 hours) at 02/18/15 1159 Last data filed at 02/18/15 0942  Gross per 24 hour  Intake    600 ml  Output    325 ml  Net    275 ml    PHYSICAL EXAMINATION:  GENERAL:  59 y.o.-year-old patient lying in the bed with no acute distress.  EYES: Pupils equal, round, reactive to light and accommodation. No scleral icterus. Extraocular muscles intact.  HEENT: Head atraumatic, normocephalic. Oropharynx and nasopharynx clear.  NECK:  Supple, no jugular venous  distention. No thyroid enlargement, no tenderness.  LUNGS: Normal breath sounds bilaterally, no wheezing, rales,rhonchi or crepitation. No use of accessory muscles of respiration.  CARDIOVASCULAR: S1, S2 normal. No murmurs, rubs, or gallops.  ABDOMEN: Soft, non-tender, non-distended. Bowel sounds present. No organomegaly or mass.  EXTREMITIES: No pedal edema, cyanosis, or clubbing.  NEUROLOGIC: Cranial nerves II through XII are intact. No focal motor or sensory defecits b/l.  PSYCHIATRIC: The patient is alert and oriented x 3.  SKIN: No obvious rash, lesion, or ulcer.   DATA REVIEW:   CBC  Recent Labs Lab 02/17/15 0420  WBC 5.4  HGB 10.9*  HCT 32.8*  PLT 210    Chemistries   Recent Labs Lab 02/16/15 1250 02/16/15 1610  02/18/15 0422  NA 132*  --   < > 133*  K 3.6  --   < > 3.5  CL 89*  --   < > 92*  CO2 29  --   < > 29  GLUCOSE 280*  --   < > 224*  BUN 29*  --   < > 35*  CREATININE 1.44*  --   < > 1.73*  CALCIUM 8.5*  --   < > 8.5*  MG  --  1.8  --   --   AST 23  --   --   --   ALT 10*  --   --   --   ALKPHOS 243*  --   --   --   BILITOT 0.6  --   --   --   < > = values in this interval not displayed.  Cardiac Enzymes  Recent Labs Lab 02/17/15 0420  TROPONINI 0.03    Microbiology Results  Results for orders placed or performed in visit on 08/21/14  ED Influenza     Status: None   Collection Time: 08/21/14  4:53 PM  Result Value Ref Range Status   Influenza A By PCR NEGATIVE NEGATIVE Final   Influenza B By PCR NEGATIVE NEGATIVE Final   H1N1 flu by pcr NOT DETECTED NOT-DETECTED Final    Comment:                  ----------------------- The Xpert Flu assay (FDA approval for nasal aspirates or washes and nasopharyngeal swab specimens), is intended as an aid in the diagnosis of influenza and should not be used as a sole basis for treatment.     RADIOLOGY:  Dg Chest Portable 1 View  02/16/2015  CLINICAL DATA:  Lower extremity swelling bilaterally with  shortness of breath weakness and known congestive heart failure  EXAM: PORTABLE CHEST - 1 VIEW  COMPARISON:  10/30/2014  FINDINGS: Moderate enlargement of cardiac silhouette stable. There is central vascular congestion. There is peribronchial cuffing. Retrocardiac area of the left lung not well evaluated. No focal consolidation on the right. Mild interstitial prominence.  IMPRESSION: Findings suggest congestive heart failure with mild pulmonary edema. Retrocardiac portion of left lower lobe, which is extensive given the cardiac enlargement, not evaluated on AP film.   Electronically Signed   By: Esperanza Heir M.D.   On: 02/16/2015 13:06      Management plans discussed with the patient, family and they are in agreement.  CODE STATUS:     Code Status Orders        Start     Ordered   02/16/15 1536  Full code   Continuous     02/16/15 1537      TOTAL TIME TAKING CARE OF THIS PATIENT: 40 minutes.    Houston Siren M.D on 02/18/2015 at 11:59 AM  Between 7am to 6pm - Pager - [phone]  After 6pm go to [email or web address] - password [email or web address] [facility]  [facility]  Office  [phone]  CC: Primary care physician; Mickey Farber, MD

## 2015-02-18 NOTE — Progress Notes (Signed)
[name] is  in the room right now and the charge nurse went in to talk to her to see if she brought the insulin so that the RN will take it to the pharmacy to label it.  The charge nurse was yelled at in the process of talking to them. Patient demanded to sign AMA and finally declined to stay.  Patient is very very upset right now.  Dr. Anne HahnWillis notified and he's on the phone talking to the wife right now. Will continue to monitor.

## 2015-02-18 NOTE — Progress Notes (Signed)
[facility] Cardiology  SUBJECTIVE: I am still swollen   Filed Vitals:   02/17/15 2008 02/17/15 2033 02/18/15 0503 02/18/15 0504  BP: 110/46  92/53 112/52  Pulse: 77  84   Temp: 98 F (36.7 C)  98.3 F (36.8 C)   TempSrc: Oral  Oral   Resp: 20  20   Height:      Weight:    156.9 kg (345 lb 14.4 oz)  SpO2: 94% 93% 93%      Intake/Output Summary (Last 24 hours) at 02/18/15 0926 Last data filed at 02/18/15 0504  Gross per 24 hour  Intake    360 ml  Output    325 ml  Net     35 ml      PHYSICAL EXAM  General: Well developed, well nourished, in no acute distress HEENT:  Normocephalic and atramatic Neck:  No JVD.  Lungs: Clear bilaterally to auscultation and percussion. Heart: HRRR . Normal S1 and S2 without gallops or murmurs.  Abdomen: Bowel sounds are positive, abdomen soft and non-tender  Msk:  Back normal, normal gait. Normal strength and tone for age. Extremities: 2+ bilateral pedal edema  Neuro: Alert and oriented X 3. Psych:  Good affect, responds appropriately   LABS: Basic Metabolic Panel:  Recent Labs  16/10/96 1610 02/17/15 0420 02/18/15 0422  NA  --  133* 133*  K  --  3.5 3.5  CL  --  95* 92*  CO2  --  29 29  GLUCOSE  --  188* 224*  BUN  --  29* 35*  CREATININE  --  1.49* 1.73*  CALCIUM  --  8.4* 8.5*  MG 1.8  --   --    Liver Function Tests:  Recent Labs  02/16/15 1250  AST 23  ALT 10*  ALKPHOS 243*  BILITOT 0.6  PROT 6.8  ALBUMIN 2.7*   No results for input(s): LIPASE, AMYLASE in the last 72 hours. CBC:  Recent Labs  02/16/15 1250 02/17/15 0420  WBC 6.4 5.4  NEUTROABS 4.7  --   HGB 11.5* 10.9*  HCT 35.2* 32.8*  MCV 83.5 83.0  PLT 224 210   Cardiac Enzymes:  Recent Labs  02/16/15 1610 02/16/15 2131 02/17/15 0420  TROPONINI 0.03 0.03 0.03   BNP: Invalid input(s): POCBNP D-Dimer: No results for input(s): DDIMER in the last 72 hours. Hemoglobin A1C:  Recent Labs  02/16/15 1610  HGBA1C 7.6*   Fasting Lipid  Panel: No results for input(s): CHOL, HDL, LDLCALC, TRIG, CHOLHDL, LDLDIRECT in the last 72 hours. Thyroid Function Tests: No results for input(s): TSH, T4TOTAL, T3FREE, THYROIDAB in the last 72 hours.  Invalid input(s): FREET3 Anemia Panel: No results for input(s): VITAMINB12, FOLATE, FERRITIN, TIBC, IRON, RETICCTPCT in the last 72 hours.  Dg Chest Portable 1 View  02/16/2015   CLINICAL DATA:  Lower extremity swelling bilaterally with shortness of breath weakness and known congestive heart failure  EXAM: PORTABLE CHEST - 1 VIEW  COMPARISON:  10/30/2014  FINDINGS: Moderate enlargement of cardiac silhouette stable. There is central vascular congestion. There is peribronchial cuffing. Retrocardiac area of the left lung not well evaluated. No focal consolidation on the right. Mild interstitial prominence.  IMPRESSION: Findings suggest congestive heart failure with mild pulmonary edema. Retrocardiac portion of left lower lobe, which is extensive given the cardiac enlargement, not evaluated on AP film.   Electronically Signed   By: Esperanza Heir M.D.   On: 02/16/2015 13:06     Echo normal left  ventricular function with LV ejection fraction of 55%  TELEMETRY: Normal sinus rhythm:  ASSESSMENT AND PLAN:  Principal Problem:   Acute CHF Active Problems:   Hyponatremia   A-fib   Chronic respiratory failure    1. Acute on chronic diastolic congestive heart failure, modestly improved after diuresis, multifactorial secondary to essential hypertension, obesity, chronic kidney disease, sleep apnea 2. Essential hypertension, blood pressure under good control 3. Obstructive sleep apnea, and noncompliant as outpatient with CPAP 4. Pulmonary hypertension  Recommendations  1. Continue diuresis 2. Stressed to the patient about the importance of compliance with medications, low-sodium diet, and CPAP 3. Counseled the patient about the importance of weight reduction and regular exercise 4. Follow-up  as outpatient with Dr. Gwen PoundsKowalski  SIGN OFF FOR NOW, PLEASE CALL IF ANY QUESTIONS   Marcina MillardPARASCHOS,Daymond Cordts, MD, PhD, [facility] 02/18/2015 9:26 AM

## 2015-02-18 NOTE — Progress Notes (Signed)
ANTICOAGULATION CONSULT NOTE - FOLLOW UP  Pharmacy Consult for warfarin Indication: atrial fibrillation  Allergies  Allergen Reactions  . Enalapril Cough  . Lovastatin Other (See Comments)    tachycardia  . Atorvastatin Palpitations    Patient Measurements: Height: 6' (182.9 cm) Weight: (!) 345 lb 14.4 oz (156.9 kg) IBW/kg (Calculated) : 77.6  Vital Signs: Temp: 98.3 F (36.8 C) (05/30 0503) Temp Source: Oral (05/30 0503) BP: 112/52 mmHg (05/30 0504) Pulse Rate: 84 (05/30 0503)  Labs:  Recent Labs  02/16/15 1250 02/16/15 1610 02/16/15 2131 02/17/15 0420 02/18/15 0422  HGB 11.5*  --   --  10.9*  --   HCT 35.2*  --   --  32.8*  --   PLT 224  --   --  210  --   APTT 39*  --   --   --   --   LABPROT 19.7*  --   --  19.7* 19.0*  INR 1.65  --   --  1.65 1.57  CREATININE 1.44*  --   --  1.49* 1.73*  TROPONINI 0.03 0.03 0.03 0.03  --     Estimated Creatinine Clearance: 71.1 mL/min (by C-G formula based on Cr of 1.73).   Medical History: Past Medical History  Diagnosis Date  . Cataract   . CHF (congestive heart failure)   . COPD (chronic obstructive pulmonary disease)   . Diabetes mellitus without complication   . Sleep apnea   . Oxygen deficiency   . Thyroid disease   . Gout   . Hypothyroidism   . CKD (chronic kidney disease) stage 3, GFR 30-59 ml/min     Medications:  Scheduled:  . aspirin EC  81 mg Oral Daily  . carvedilol  3.125 mg Oral BID WC  . colchicine  0.6 mg Oral Daily  . furosemide  40 mg Intravenous Q12H  . gabapentin  100 mg Oral BID  . insulin aspart  0-5 Units Subcutaneous QHS  . insulin aspart  10 Units Subcutaneous TID WC  . insulin detemir  80 Units Subcutaneous BID AC  . ipratropium-albuterol  3 mL Nebulization Q4H  . levothyroxine  100 mcg Oral Daily  . losartan  25 mg Oral Daily  . methylPREDNISolone (SOLU-MEDROL) injection  40 mg Intravenous Q12H  . metolazone  2.5 mg Oral Once per day on Mon Wed Fri  . nicotine  7 mg  Transdermal Daily  . PARoxetine  20 mg Oral Daily  . potassium chloride  10 mEq Oral BID  . pravastatin  40 mg Oral QHS  . sodium chloride  3 mL Intravenous Q12H  . spironolactone  25 mg Oral Daily  . warfarin  10 mg Oral Once per day on Mon Fri  . [START ON 02/19/2015] warfarin  5 mg Oral Once per day on Sun Tue Wed Thu Sat  . Warfarin - Pharmacist Dosing Inpatient   Does not apply q1800    Assessment: 59 yo male here with acute CHF on Coumadin for AFib and hx of PE. Per home med list PTA, warfarin 10 mg Mon/Fri and 5 mg TWThSS, last dose 5/28 at 6 am (regimen and timing of last dose verified with pt's wife).  Pt currently receiving methylprednisolone IV.  INR 5/30 = 1.57, Hgb 10.9, warfarin 5 mg  Goal of Therapy:   INR 2-3 Monitor platelets by anticoagulation protocol: Yes    Plan:  Will modify patient's warfarin orders to reflect PTA dosing: Monday and Friday = 10  mg; 5 mg on all other days (Sat, Sun, Tues, Weds, Thurs). Pt will receive 10 mg today 5/30 @1700 . Will follow up INR in AM.  Elvera MariaSonja Leiby Pigeon, PharmD  02/18/2015

## 2015-02-18 NOTE — Care Management (Signed)
Discussed need to have portable 02 brought to the hospital for transport home.  His 02 is through [employer].  This is a former COPD Gold patient/ [facility].  When CM walked in the room patient stated "Do not even think about setting up any home heaqlth.  They drove my wife and me crazy last time with all those visits."

## 2015-02-19 ENCOUNTER — Other Ambulatory Visit: Payer: Self-pay | Admitting: *Deleted

## 2015-02-19 NOTE — Patient Outreach (Signed)
Attempt made to contact pt as part of transition of care ([facility]   HIPPA compliant voice message left with contact number.  If no response, will try again tomorrow.     Note- [facility], home visit scheduled with this RN CM  for 6/3.    Shayne Alkenose M.   Theola Cuellar RN CCM [facility] Care [facility]  [phone]

## 2015-02-20 ENCOUNTER — Other Ambulatory Visit: Payer: Self-pay | Admitting: *Deleted

## 2015-02-20 NOTE — Patient Outreach (Addendum)
Second attempt made to contact pt as part of transition of care (discharged 5/30).   HIPPA compliant voice message left with contact number.   If no response, will try  again tomorrow.  This RN CM scheduled to do home visit with pt 6/3.       Shayne Alkenose M.   Pierzchala RN CCM [facility] Care [facility]  [phone]

## 2015-02-21 ENCOUNTER — Other Ambulatory Visit: Payer: Self-pay | Admitting: *Deleted

## 2015-02-21 NOTE — Patient Outreach (Signed)
Transition of care call:  Pt states he bought a scale but it broke after 2 weeks, bought it with his Flex card- cannot return.   Pt states the reason he went into the hospital was swelling.  Pt states he saw Kidney MD yesterday, weight was 341 lbs.  Pt states MD wants to set up appointment with lung MD as was told in hospital need to be on CPAP.   Pt states he tried to f/u with Dr. Harrington Challengerhies post discharge, was running late so appointment was cancelled.  Pt states he is looking to change doctors, has an appointment to see Dr. Judithann SheenSparks 7/1.   Pt states he has an appointment already scheduled with heart MD 7/15. Pt states spouse handles his meds.    RN CM discussed with pt can provide a scale, scheduled to see pt tomorrow 6/3.     Shayne Alkenose M.   Josemaria Brining RN CCM [facility]  [phone]

## 2015-02-22 ENCOUNTER — Other Ambulatory Visit: Payer: Self-pay | Admitting: *Deleted

## 2015-02-22 VITALS — BP 126/76 | HR 78 | Resp 24 | Ht 70.0 in

## 2015-02-22 DIAGNOSIS — I509 Heart failure, unspecified: Secondary | ICD-10-CM

## 2015-02-23 NOTE — Patient Outreach (Signed)
[name]) Care Management  [place]   02/22/15  Tyler [name] 01-15-1956 [id number]  Subjective:  Pt states he saw Kidney MD, no medication changes.  Pt states he is not taking the two Heart medications that were prescribed from the hospital, as in the past his heart and kidney MDs  Said  on too much heart medication, taken off- BP low.  Pt states he had a Coumadin check  Three days ago at Dr. Raechel Ache office, no results.  Pt states he is going to have spouse call during her lunch. Pt states Dr. Raechel Ache office knows he is leaving, to see Dr. [name] 7/1- wants to talk to him before taking  Any new meds.  Pt  states the swelling he now has in legs is  normal.   Objective:  Edema= +2 top of right foot/+1 right lower leg.  +2 left lower leg,+3 top of left foot. (support                      Stockings on.                      Lungs clear.   Blister (intact) on top of left big toe.     Filed Vitals:   02/22/15 1038  BP: 126/76  Pulse: 78  Resp: 24     Current Medications:  Current Outpatient Prescriptions  Medication Sig Dispense Refill  . colchicine 0.6 MG tablet 0.6 mg 2 (two) times daily. TAKE ONE TABLET BY MOUTH TWICE DAILY AS NEEDED FOR  WRIST  PAIN    . cyanocobalamin (,VITAMIN B-12,) 1000 MCG/ML injection Inject 1,000 mcg into the muscle every 30 (thirty) days.    Marland Kitchen gabapentin (NEURONTIN) 100 MG capsule Take 100 mg by mouth 2 (two) times daily.     . insulin NPH Human (HUMULIN N,NOVOLIN N) 100 UNIT/ML injection Inject 80 Units into the skin 2 (two) times daily before a meal. 80 units subcutaneous 30 min prior to breakfast, 80 units subcutaneous 30 min prior to supper.    . insulin regular (NOVOLIN R,HUMULIN R) 100 units/mL injection Inject 10 Units into the skin 2 (two) times daily before a meal. 10 units 30 min prior to meal if BG >120 & eating    . lactulose (CHRONULAC) 10 GM/15ML solution Take 30 g by mouth 2 (two) times daily as needed for mild  constipation or moderate constipation.    Marland Kitchen levothyroxine (SYNTHROID, LEVOTHROID) 100 MCG tablet Take 100 mcg by mouth daily. Patient takes this medication at 6am.    . metolazone (ZAROXOLYN) 5 MG tablet Take 5 mg by mouth 3 (three) times a week. Pt takes Monday, Wednesday, Friday    . PARoxetine (PAXIL) 20 MG tablet Take 20 mg by mouth daily.     . potassium chloride (K-DUR,KLOR-CON) 10 MEQ tablet Take 10 mEq by mouth daily. Pt takes 2 tablets daily as reported on h/v 6/3    . pravastatin (PRAVACHOL) 40 MG tablet Take 40 mg by mouth at bedtime.     . predniSONE (DELTASONE) 10 MG tablet Take 1 tablet (10 mg total) by mouth daily with breakfast. 15 tablet 0  . spironolactone (ALDACTONE) 25 MG tablet Take 25 mg by mouth daily.     . Tiotropium Bromide-Olodaterol 2.5-2.5 MCG/ACT AERS Inhale 1 spray into the lungs daily as needed (for wheezing and shortness of breath.).    Marland Kitchen torsemide (DEMADEX) 20 MG tablet Take 20  mg by mouth 2 (two) times daily.     . vitamin B-12 (CYANOCOBALAMIN) 1000 MCG tablet Take 2,000 mcg by mouth daily.    Marland Kitchen warfarin (COUMADIN) 10 MG tablet Take 5-10 mg by mouth daily. Pt states 1 tablet orally Monday and Friday.  Take 1/2 tablet (80m) orally Tuesday, Wednesday, Thursday, Saturday, and Sunday.    . carvedilol (COREG) 3.125 MG tablet Take 1 tablet (3.125 mg total) by mouth 2 (two) times daily with a meal. (Patient not taking: Reported on 02/22/2015) 60 tablet 1  . losartan (COZAAR) 25 MG tablet Take 1 tablet (25 mg total) by mouth daily. (Patient not taking: Reported on 02/22/2015) 30 tablet 1  . metolazone (ZAROXOLYN) 2.5 MG tablet Take 2.5 mg by mouth 3 (three) times a week. Patient takes orally on Monday, Wednesday, and Friday.    . nystatin (MYCOSTATIN) powder Apply 1 g topically daily as needed (for rash.).     .Marland Kitchenpotassium chloride (K-DUR,KLOR-CON) 10 MEQ tablet Take 20 mEq by mouth daily. TAKE TWO TABLETS BY MOUTH ONCE DAILY     No current facility-administered medications  for this visit.     Assessment:  Recent hospital discharge (HF)- pt not weighing- pt reports scale broke, edema noted in bilateral lower extremities.    Scale provided by RN CM- [facility]   Plan:  Pt to weigh daily/record/call MD if 3 lbs in a day,5 lbs in a week            Pt to have spouse call Dr. TRaechel Acheoffice today, check on Coumadin results            Pt to f/u with new primary care MD Dr. SDoy Hutching7/1.             Pt to receive weekly phone calls as part of transition of care, next call 6/10 (to be done by nurse covering                 For current RN CM who will be on vacation.            Next home visit scheduled for 7/5.   [facility] Care Plan Problem One        [place]  [place]   [facility] Long Term Goal (31-90 days)  Pt to have son  purchase scale within next 31 days, [place]15   [place]  -- [5/3- goal not met,  pt did not obtain scale yet ]   Interventions for Problem One Long Term Goal  Reinforced importance of pt weighing daily/recording.    [facility] CM Short Term Goal #1 Met Date  01/22/15 [pt states been getting out more, swelling down little ]   [facility] CM Short Term Goal #2 Met Date  -- [goal met. ]   [facility] CM Short Term Goal #3 (0-30 days)  with increased activities, pt to elevate legs more to help bring  edema down    [facility] CM Short Term Goal #3 Start Date  01/22/15   Interventions for Short Tern Goal #3  Discussed with pt pacing activities, take time to elevate legs to help bring  edema  down     [facility] Problem Two        Patient Outreach from 01/22/2015  in [place] Problem Two  Recent hospitalization- HF    Care Plan for Problem Two  Not Active   Interventions for Problem Two Long Term Goal   Discussed  with pt plan to f/u with weekly phone calls  30 days post discharge  as part of transition of care    [facility] Long Term Goal (31-90) days  Pt would not be readmitted in the next 31 days    [facility] Long Term Goal Start Date  11/09/14   [facility] Long Term Goal Met Date  12/06/14   [facility] CM Short Term Goal #1 (0-30 days)  pt would pick up new med from pharmacy in the next 1-2 days    [facility] CM Short Term Goal #1 Start Date  11/09/14   [facility] CM Short Term Goal #1 Met Date   11/16/14 [pt reported 2/26- had all of his meds. ]    [facility] CM Care Plan Problem Three        Patient Outreach from 02/22/2015 in [place] Problem Three  Recent admission for HF    Care Plan for Problem Three  Active   [facility] Long Term Goal (31-90) days  Pt would not admit back into the hospital in the next 31 days    [facility] Long Term Goal Start Date  02/22/15   Interventions for Problem Three Long Term Goal  -- [Reviewed with pt the 2 zones of HF, paying close attention to the yellow, when to call MD ]   [facility] CM Short Term Goal #1 (0-30 days)  Pt would weigh daily/record in the next 30 days, call MD if gain 3 lbs in a day, 5 lbs in a month.    [facility] CM Short Term Goal #1 Start Date  02/22/15   Interventions for Short Term Goal #1  -- [Provided pt with a scale, had him weigh during home visit, reinforced importance of  monitoring weight closely for HF. ]     Tyler [name].   [place] Care Management  [phone]

## 2015-03-01 ENCOUNTER — Other Ambulatory Visit: Payer: Self-pay | Admitting: *Deleted

## 2015-03-01 NOTE — Patient Outreach (Signed)
Attempted transition of care call but given home number relayed the number had been changed to a non published number. Only other number given was a work number for Pt's spouse.   Plan: RNCM will attempt to call Gerrit Halls at her work number.   Costella Hatcher RN, BSN  [facility] Care Management [phone])

## 2015-03-01 NOTE — Patient Outreach (Signed)
RNCM attempted to call pt's wife's work number to obtain the new home number but this number had been disconnected. Will report to [facility] RN, CCM unable to contact pt.   Plan: Report to [facility] unsuccessful TOC.   Costella Hatcher RN, BSN  [facility] Care Management [phone])

## 2015-03-04 ENCOUNTER — Other Ambulatory Visit: Payer: Self-pay | Admitting: *Deleted

## 2015-03-04 DIAGNOSIS — I509 Heart failure, unspecified: Secondary | ICD-10-CM

## 2015-03-04 NOTE — Patient Outreach (Signed)
Transition of care call (week 3):  Note- This RN CM received a voice message from pt 6/9 with new phone number, (RN CM  on vacation 6/6-6/10), coworker Ma Rings RN unable to contact pt 6/10 for TOC- did not have new number.  Pt states weights are staying 324-325 lbs, today was 330 lbs but that was because he had a heavy meal last night.  Pt reports no sob, swelling is down in his legs.  Pt states sugar this am was 187, again ate a heavy meal last night.  Pt states Dr. Harrington Challenger office did call his wife back about his Coumadin, told to continue to do what you are doing.   Pt states he is to see Heart MD 6/15, have him check his Coumadin.  Pt states he is to see Dr. Judithann Sheen 7/18.  Pt states blister on left big toe is healed up.   RN CM discussed f/u again 6/22 telephonically as part of ongoing transition of care.     Shayne Alken.   Khamiya Varin RN CCM [facility] Care Management  [phone]

## 2015-03-12 ENCOUNTER — Encounter: Payer: Self-pay | Admitting: *Deleted

## 2015-03-12 ENCOUNTER — Encounter: Payer: Self-pay | Admitting: Internal Medicine

## 2015-03-12 ENCOUNTER — Ambulatory Visit (INDEPENDENT_AMBULATORY_CARE_PROVIDER_SITE_OTHER): Payer: Commercial Managed Care - HMO | Admitting: Internal Medicine

## 2015-03-12 VITALS — BP 120/82 | HR 60 | Temp 97.9°F | Ht 72.0 in | Wt 337.0 lb

## 2015-03-12 DIAGNOSIS — J9611 Chronic respiratory failure with hypoxia: Secondary | ICD-10-CM | POA: Diagnosis not present

## 2015-03-12 DIAGNOSIS — Z720 Tobacco use: Secondary | ICD-10-CM | POA: Diagnosis not present

## 2015-03-12 DIAGNOSIS — J449 Chronic obstructive pulmonary disease, unspecified: Secondary | ICD-10-CM | POA: Diagnosis not present

## 2015-03-12 NOTE — Progress Notes (Signed)
Date: 03/12/2015  MRN# [id number] Tyler [name] 1956/07/22  Referring Physician:  Dr. Mosetta Pigeon Tyler [name] is a 59 y.o. old male seen in consultation for COPD optimization  CC:  Chief Complaint  Patient presents with  . Advice Only    COPD consult referred by Dr. Thedore Mins:    HPI:  Patient is a pleasant 59 year old male seen in consultation for COPD optimization. Patient has a past medical history of diastolic heart failure, chronic kidney disease, COPD, obstructive sleep apnea, atrial fibrillation, who was recently admitted to the hospital for CHF exacerbation. See hospital discharge summary as stated below. Patient states that he has been on chronic oxygen for the past 25 years, he was placed in a by his primary care physician at that time, he is currently on pulse dose, 2 L. He endorses a mild cough, usually in the morning, this associated with yellow to clear phlegm. He has significant smoking history which includes 2 packs per day of tobacco for 30 years, quit in 1999 and since 2004 has been smoking one to 3 cigarettes per day. He was previously in [employer] in the [employer], working in [employer]. Patient states that overall he does not require yearly use of prednisone or Tobi Bastos biotics, semi-as we may be worse than others for these drugs to treat bronchitis upper spray tract infection, today he is also accompanied by his wife. Patient also stated he has a history of DVT has an IVC filter in place, he also has a history of pulmonary embolus and is on Coumadin, he is a known blood clotting disorder, which he stated his mother had also. Patient is currently using Olodaterol (resuce inhaler), 2-3 times per day, but states this is normal for him.     [facility] hospitalization review PATIENT NAME: Tyler [name]   MR#: [id number]  DATE OF BIRTH: [birth year]  DATE OF ADMISSION: 5/28/2016ADMITTING PHYSICIAN: Shaune Pollack, MD  DATE OF DISCHARGE:  02/18/2015  PRIMARY CARE PHYSICIAN: THIES, DAVID, MD    ADMISSION DIAGNOSIS:  Acute exacerbation of CHF (congestive heart failure) [I50.9]  DISCHARGE DIAGNOSIS:  Principal Problem:  Acute CHF Active Problems:  Hyponatremia  A-fib  Chronic respiratory failure   SECONDARY DIAGNOSIS:   Past Medical History  Diagnosis Date  . Cataract   . CHF (congestive heart failure)   . COPD (chronic obstructive pulmonary disease)   . Diabetes mellitus without complication   . Sleep apnea   . Oxygen deficiency   . Thyroid disease   . Gout   . Hypothyroidism   . CKD (chronic kidney disease) stage 3, GFR 30-59 ml/min     HOSPITAL COURSE:   59 year old male with past medical history of obstructive sleep apnea, chronic diastolic CHF, COPD, type 2 diabetes, hypothyroidism, gout, chronic kidney disease stage III, who presented to the hospital due to shortness of breath and weakness and noted to be in acute respiratory failure.  #1 acute on chronic diastolic CHF-this is likely secondary to patient's medical noncompliance for sleep apnea. Patient apparently is claustrophobic and therefore does not use his CPAP presently. - Patient was diuresed with IV Lasix, and he has improved. -Patient was seen by cardiology who agreed with this management, echocardiogram showed normal ejection fraction of 55-60% and moderate pulmonary hypertension. -Patient will continue some low-dose Coreg and losartan. Patient will follow-up with cardiology as an outpatient with Dr. Gwen Pounds.   #2 COPD with mild acute exacerbation. -Patient was started on some IV steroids, given duo nebs around-the-clock, maintained  on Spiriva. -He has clinically improved and therefore is being discharged on oral prednisone taper. -Patient is already on oxygen at home.  #3 history of previous DVT and PE-patient was maintained on his warfarin he will resume that.  #4 anxiety-patient was  maintained on his Paxil and he will continue that.  #5 hyperlipidemia-patient was maintained on his Pravachol he will resume that.  #6 diabetes type 2 without complication-patient was on sliding scale insulin the hospital but will resume his NPH and regular insulin.  #7 gout-continue colchicine.  #8 diabetic neuropathy-continue Neurontin  #9 hypothyroidism-continue Synthroid      PMHX:   Past Medical History  Diagnosis Date  . Cataract   . CHF (congestive heart failure)   . COPD (chronic obstructive pulmonary disease)   . Diabetes mellitus without complication   . Sleep apnea   . Oxygen deficiency   . Thyroid disease   . Gout   . Hypothyroidism   . CKD (chronic kidney disease) stage 3, GFR 30-59 ml/min    Surgical Hx:  Past Surgical History  Procedure Laterality Date  . Cholecystectomy      2002  . Eye surgery      right cataract removed 3/21  . Vena cava filter placement     Family Hx:  Family History  Problem Relation Age of Onset  . Diabetes Sister   . Diabetes Brother    Social Hx:   History  Substance Use Topics  . Smoking status: Current Every Day Smoker    Types: Cigars  . Smokeless tobacco: Never Used  . Alcohol Use: No   Medication:   Current Outpatient Rx  Name  Route  Sig  Dispense  Refill  . colchicine 0.6 MG tablet      0.6 mg 2 (two) times daily. TAKE ONE TABLET BY MOUTH TWICE DAILY AS NEEDED FOR  WRIST  PAIN         . cyanocobalamin (,VITAMIN B-12,) 1000 MCG/ML injection   Intramuscular   Inject 1,000 mcg into the muscle every 30 (thirty) days.         Marland Kitchen gabapentin (NEURONTIN) 100 MG capsule   Oral   Take 100 mg by mouth 2 (two) times daily.          . insulin NPH Human (HUMULIN N,NOVOLIN N) 100 UNIT/ML injection   Subcutaneous   Inject 80 Units into the skin 2 (two) times daily before a meal. 80 units subcutaneous 30 min prior to breakfast, 80 units subcutaneous 30 min prior to supper.         . insulin regular (NOVOLIN  R,HUMULIN R) 100 units/mL injection   Subcutaneous   Inject 10 Units into the skin 2 (two) times daily before a meal. 10 units 30 min prior to meal if BG >120 & eating         . lactulose (CHRONULAC) 10 GM/15ML solution   Oral   Take 30 g by mouth 2 (two) times daily as needed for mild constipation or moderate constipation.         Marland Kitchen levothyroxine (SYNTHROID, LEVOTHROID) 100 MCG tablet   Oral   Take 100 mcg by mouth daily. Patient takes this medication at 6am.         . metolazone (ZAROXOLYN) 5 MG tablet   Oral   Take 5 mg by mouth 3 (three) times a week. Pt takes Monday, Wednesday, Friday         . nystatin (MYCOSTATIN) powder   Topical  Apply 1 g topically daily as needed (for rash.).          Marland Kitchen PARoxetine (PAXIL) 20 MG tablet   Oral   Take 20 mg by mouth daily.          . potassium chloride (K-DUR,KLOR-CON) 10 MEQ tablet   Oral   Take 20 mEq by mouth daily. TAKE TWO TABLETS BY MOUTH ONCE DAILY         . pravastatin (PRAVACHOL) 40 MG tablet   Oral   Take 40 mg by mouth at bedtime.          Marland Kitchen spironolactone (ALDACTONE) 25 MG tablet   Oral   Take 25 mg by mouth daily.          . Tiotropium Bromide-Olodaterol 2.5-2.5 MCG/ACT AERS   Inhalation   Inhale 1 spray into the lungs daily as needed (for wheezing and shortness of breath.).         Marland Kitchen torsemide (DEMADEX) 20 MG tablet   Oral   Take 20 mg by mouth 2 (two) times daily.          . vitamin B-12 (CYANOCOBALAMIN) 1000 MCG tablet   Oral   Take 2,000 mcg by mouth daily.         Marland Kitchen warfarin (COUMADIN) 10 MG tablet   Oral   Take 5-10 mg by mouth daily. Pt states 1 tablet orally Monday and Friday.  Take 1/2 tablet (5mg ) orally Tuesday, Wednesday, Thursday, Saturday, and Sunday.             Allergies:  Enalapril; Lovastatin; and Atorvastatin  Review of Systems: Gen:  Denies  fever, sweats, chills HEENT: Denies blurred vision, double vision, ear pain, eye pain, hearing loss, nose bleeds,  sore throat Cvc:  No dizziness, chest pain or heaviness Resp:   Cough, chronic sob Gi: Denies swallowing difficulty, stomach pain, nausea or vomiting, diarrhea, constipation, bowel incontinence Gu:  Denies bladder incontinence, burning urine Ext:   No Joint pain, stiffness or swelling Skin: No skin rash, easy bruising or bleeding or hives Endoc:  No polyuria, polydipsia , polyphagia or weight change Psych: No depression, insomnia or hallucinations  Other:  All other systems negative  Physical Examination:   VS: BP 120/82 mmHg  Pulse 60  Temp(Src) 97.9 F (36.6 C) (Oral)  Ht 6' (1.829 m)  Wt 337 lb (152.862 kg)  BMI 45.70 kg/m2  SpO2 93%  General Appearance: No distress  Neuro:without focal findings, mental status, speech normal, alert and oriented, cranial nerves 2-12 intact, reflexes normal and symmetric, sensation grossly normal  HEENT: PERRLA, EOM intact, no ptosis, no other lesions noticed; Mallampati 2 Pulmonary: normal breath sounds, decreased at the bases, diaphragmatic excursion normal.No wheezing, No rales;   Sputum Production:  none CardiovascularNormal S1,S2.  No m/r/g.  Abdominal aorta pulsation normal.    Abdomen: Benign, Soft, non-tender, No masses, hepatosplenomegaly, No lymphadenopathy Renal:  No costovertebral tenderness  GU:  No performed at this time. Endoc: No evident thyromegaly, no signs of acromegaly or Cushing features Skin:   warm, no rashes, no ecchymosis  Extremities: normal, no cyanosis, clubbing, no edema, warm with normal capillary refill. Other findings:   Labs results:   Rad results: (The following images and results were reviewed by Dr. Dema Severin). 02/16/15 PORTABLE CHEST - 1 VIEW  COMPARISON: 10/30/2014  FINDINGS: Moderate enlargement of cardiac silhouette stable. There is central vascular congestion. There is peribronchial cuffing. Retrocardiac area of the left lung not well evaluated. No focal consolidation  on the right. Mild  interstitial prominence.  IMPRESSION: Findings suggest congestive heart failure with mild pulmonary edema. Retrocardiac portion of left lower lobe, which is extensive given the cardiac enlargement, not evaluated on AP film.   ECHO  Study Conclusions  - Left ventricle: The cavity size was normal. Systolic function was normal. The estimated ejection fraction was in the range of 50% to 55%. - Aortic valve: Valve area (Vmax): 2.79 cm^2. - Mitral valve: There was mild regurgitation. - Left atrium: The atrium was mildly dilated. - Right ventricle: The cavity size was mildly dilated. - Right atrium: The atrium was mildly dilated. - Tricuspid valve: There was moderate regurgitation. - Pericardium, extracardiac: A small to moderate, partially loculated pericardial effusion was identified posterior to the heart.  Impressions:  - The right ventricular systolic pressure was increased consistent with moderate pulmonary hypertension.   Assessment and Plan: 59 year old male seen in consultation for COPD optimization Chronic respiratory failure Multifactorial: Diastolic heart failure, COPD, tobacco abuse, deconditioning, obesity, suspected interstitial lung disease. Continue with current use of chronic oxygen pulsed dosing.  Given that he worked in [employer], there is a high suspicion that his current worsening of symptoms from a pulmonary standpoint might be due to underlying interstitial lung disease.  Plan: -CT scan of chest without contrast -Continue with pulse dose chronic oxygen use -Pulmonary function testing and 6 minute walk test prior to follow-up  Tobacco abuse Cigar/Tobacco Cessation - Counseling regarding benefits of smoking cessation strategies was provided for more than 12 min. - Educated that at this time smoking- cessation represents the single most important step that patient can take to enhance the length and quality of live. - Educated patient  regarding alternatives of behavior interventions, pharmacotherapy including NRT and non-nicotine therapy such, and combinations of both. - Patient at this time will try to cut down his cigar consumption.   COPD, moderate Clinically patient with COPD. He will require a primary function testing to objectively class his level of obstruction. His prolonged use of tobacco followed by cigar use is not helping his overall pulmonary status and could be impacting his cardiac function also. I have counseled the patient greatly on reduction of cigar use and improvement in diet and lifestyle modification. He is currently using his rescue inhaler multiple times per day with little relief, but he is tolerating his level of hypoxia and dyspnea.  Plan: -CT scan chest without contrast -Pulmonary function testing and 6 minute walk testing prior to follow-up visit    Updated Medication List Outpatient Encounter Prescriptions as of 03/12/2015  Medication Sig  . colchicine 0.6 MG tablet 0.6 mg 2 (two) times daily. TAKE ONE TABLET BY MOUTH TWICE DAILY AS NEEDED FOR  WRIST  PAIN  . cyanocobalamin (,VITAMIN B-12,) 1000 MCG/ML injection Inject 1,000 mcg into the muscle every 30 (thirty) days.  Marland Kitchen gabapentin (NEURONTIN) 100 MG capsule Take 100 mg by mouth 2 (two) times daily.   . insulin NPH Human (HUMULIN N,NOVOLIN N) 100 UNIT/ML injection Inject 80 Units into the skin 2 (two) times daily before a meal. 80 units subcutaneous 30 min prior to breakfast, 80 units subcutaneous 30 min prior to supper.  . insulin regular (NOVOLIN R,HUMULIN R) 100 units/mL injection Inject 10 Units into the skin 2 (two) times daily before a meal. 10 units 30 min prior to meal if BG >120 & eating  . lactulose (CHRONULAC) 10 GM/15ML solution Take 30 g by mouth 2 (two) times daily as needed for mild constipation  or moderate constipation.  Marland Kitchen levothyroxine (SYNTHROID, LEVOTHROID) 100 MCG tablet Take 100 mcg by mouth daily. Patient takes this  medication at 6am.  . metolazone (ZAROXOLYN) 5 MG tablet Take 5 mg by mouth 3 (three) times a week. Pt takes Monday, Wednesday, Friday  . nystatin (MYCOSTATIN) powder Apply 1 g topically daily as needed (for rash.).   Marland Kitchen PARoxetine (PAXIL) 20 MG tablet Take 20 mg by mouth daily.   . potassium chloride (K-DUR,KLOR-CON) 10 MEQ tablet Take 20 mEq by mouth daily. TAKE TWO TABLETS BY MOUTH ONCE DAILY  . pravastatin (PRAVACHOL) 40 MG tablet Take 40 mg by mouth at bedtime.   Marland Kitchen spironolactone (ALDACTONE) 25 MG tablet Take 25 mg by mouth daily.   . Tiotropium Bromide-Olodaterol 2.5-2.5 MCG/ACT AERS Inhale 1 spray into the lungs daily as needed (for wheezing and shortness of breath.).  Marland Kitchen torsemide (DEMADEX) 20 MG tablet Take 20 mg by mouth 2 (two) times daily.   . vitamin B-12 (CYANOCOBALAMIN) 1000 MCG tablet Take 2,000 mcg by mouth daily.  Marland Kitchen warfarin (COUMADIN) 10 MG tablet Take 5-10 mg by mouth daily. Pt states 1 tablet orally Monday and Friday.  Take 1/2 tablet (5mg ) orally Tuesday, Wednesday, Thursday, Saturday, and Sunday.  . [DISCONTINUED] carvedilol (COREG) 3.125 MG tablet Take 1 tablet (3.125 mg total) by mouth 2 (two) times daily with a meal. (Patient not taking: Reported on 03/12/2015)  . [DISCONTINUED] losartan (COZAAR) 25 MG tablet Take 1 tablet (25 mg total) by mouth daily. (Patient not taking: Reported on 03/12/2015)  . [DISCONTINUED] metolazone (ZAROXOLYN) 2.5 MG tablet Take 2.5 mg by mouth 3 (three) times a week. Patient takes orally on Monday, Wednesday, and Friday.  . [DISCONTINUED] potassium chloride (K-DUR,KLOR-CON) 10 MEQ tablet Take 10 mEq by mouth daily. Pt takes 2 tablets daily as reported on h/v 6/3  . [DISCONTINUED] predniSONE (DELTASONE) 10 MG tablet Take 1 tablet (10 mg total) by mouth daily with breakfast. (Patient not taking: Reported on 03/12/2015)   No facility-administered encounter medications on file as of 03/12/2015.    Orders for this visit: Orders Placed This Encounter   Procedures  . CT Chest Wo Contrast    Standing Status: Future     Number of Occurrences:      Standing Expiration Date: 05/11/2016    Order Specific Question:  Reason for Exam (SYMPTOM  OR DIAGNOSIS REQUIRED)    Answer:  hypoxia, chronic SOB    Order Specific Question:  Preferred imaging location?    Answer:  [facility]  . Pulmonary function test    Standing Status: Future     Number of Occurrences:      Standing Expiration Date: 03/11/2016    Order Specific Question:  Where should this test be performed?    Answer:  [facility]    Order Specific Question:  Full PFT: includes the following: basic spirometry, spirometry pre & post bronchodilator, diffusion capacity (DLCO), lung volumes    Answer:  Full PFT    Order Specific Question:  MIP/MEP    Answer:  No    Order Specific Question:  6 minute walk    Answer:  No    Order Specific Question:  ABG    Answer:  No    Order Specific Question:  Diffusion capacity (DLCO)    Answer:  Yes    Order Specific Question:  Lung volumes    Answer:  Yes    Order Specific Question:  Methacholine challenge    Answer:  No  Thank  you for the consultation and for allowing [facility] to assist in the care of your patient. Our recommendations are noted above.  Please contact us if we can be of further service.   Stephanie Acre, MD [facility] Office Number: [phone]

## 2015-03-12 NOTE — Patient Instructions (Addendum)
Follow up with Dr. Dema Severin in 1 month - pulmonary function testing prior to follow up - CT chest w\o contrast for hypoxia, chronic shortness of breath - please cut down on the cigar use

## 2015-03-13 ENCOUNTER — Other Ambulatory Visit: Payer: Self-pay | Admitting: *Deleted

## 2015-03-13 NOTE — Patient Outreach (Signed)
Transition of care (week 4):  Pt states has a headache this morning, took Tylenol, easing off.   Pt states weight today 339 lbs., gained- appetite  picked up.  Pt states some swelling, no sob.  Pt states saw Dr. Gwen Pounds (Cardiologist), said everything looked good.  Pt states saw Lung MD yesterday, to do breathing test next month.  Pt states they are also to do another sleep study, tested 28 years ago.   Pt states Coumadin dosage is the same.  Pt was coughing during telephone conversation, pt reports coming from sinuses.  Pt states blood sugar this am 125, ranging 62 (one time) to 290.  Pt to f/u with Dr. Judithann Sheen 7/18.   RN CM discussed with pt plan to f/u again 6/30- final transition of care and next home visit 7/5.    Shayne Alken.   Pierzchala RN CCM [facility] Care Management  [phone]

## 2015-03-20 NOTE — Assessment & Plan Note (Signed)
Clinically patient with COPD. He will require a primary function testing to objectively class his level of obstruction. His prolonged use of tobacco followed by cigar use is not helping his overall pulmonary status and could be impacting his cardiac function also. I have counseled the patient greatly on reduction of cigar use and improvement in diet and lifestyle modification. He is currently using his rescue inhaler multiple times per day with little relief, but he is tolerating his level of hypoxia and dyspnea.  Plan: -CT scan chest without contrast -Pulmonary function testing and 6 minute walk testing prior to follow-up visit

## 2015-03-20 NOTE — Assessment & Plan Note (Signed)
Cigar/Tobacco Cessation - Counseling regarding benefits of smoking cessation strategies was provided for more than 12 min. - Educated that at this time smoking- cessation represents the single most important step that patient can take to enhance the length and quality of live. - Educated patient regarding alternatives of behavior interventions, pharmacotherapy including NRT and non-nicotine therapy such, and combinations of both. - Patient at this time will try to cut down his cigar consumption.

## 2015-03-20 NOTE — Assessment & Plan Note (Signed)
Multifactorial: Diastolic heart failure, COPD, tobacco abuse, deconditioning, obesity, suspected interstitial lung disease. Continue with current use of chronic oxygen pulsed dosing.  Given that he worked in [employer], there is a high suspicion that his current worsening of symptoms from a pulmonary standpoint might be due to underlying interstitial lung disease.  Plan: -CT scan of chest without contrast -Continue with pulse dose chronic oxygen use -Pulmonary function testing and 6 minute walk test prior to follow-up

## 2015-03-21 ENCOUNTER — Other Ambulatory Visit: Payer: Self-pay | Admitting: *Deleted

## 2015-03-21 DIAGNOSIS — I509 Heart failure, unspecified: Secondary | ICD-10-CM

## 2015-03-21 NOTE — Patient Outreach (Addendum)
Transition of care (final call, 31 days post discharge):  Pt states sinuses bothering him today, has a headache.  Pt states took Claritin, woke up stuffed up.  Pt states he mowed the grass a little yesterday, wears a mask but it still gets in her eyes.   RN CM suggested cutting back on mowing grass to which pt agreed.  Pt states f/u with Lung MD, to set up tests (CT scan, lung test).  Pt reports still smoking 2-3 cigars a day.  Pt states weights staying 340's, appetite up.  Pt states no sob, swelling, coughing (secretions) a little yellow, coming from sinuses.  Pt states blood sugars good, lowest 69 which he is okay with, highest 260 (had pizza).  RN CM discussed with pt today last transition of care call, next home visit 7/5.    With transition of care completed, plan to continue to provide community nurse case management services, next home visit 7/5 to which will enroll in HF program again, assist pt with self management.    Note- call back to pt, to verify weighing daily to which pt reports he is.  Short term goal reached.    Shayne Alkenose M.   Ayub Kirsh RN CCM [facility]  [phone]

## 2015-03-26 ENCOUNTER — Other Ambulatory Visit: Payer: Self-pay | Admitting: *Deleted

## 2015-03-26 VITALS — BP 120/72 | HR 68 | Resp 20 | Ht 70.0 in | Wt 357.0 lb

## 2015-03-26 DIAGNOSIS — I5041 Acute combined systolic (congestive) and diastolic (congestive) heart failure: Secondary | ICD-10-CM

## 2015-03-27 NOTE — Patient Outreach (Signed)
Tyler [name]) Care Management   03/27/2015  Tyler [name] April 20, 1956 [id number]  Tyler [name] is an 59 y.o. male  Subjective:  Pt reports saw Lung MD (Dr. Ernst Spell), to have a CT scan 7/15, breathing test 7/25 and then f/u with MD.  Pt states he is to also to go to sleep clinic 7/20, last sleep study was  28 years ago.  Pt states he is currently not using a CPAP.   Pt states his weight is going up, getting his appetite back, had too many hot dogs yesterday (holiday). Pt states with cutting back on his cigar smoking, picked up snacking.  Pt states he has not had any cigars today.   Pt states he is having a little more swelling in legs, was told by MD CPAP will help with swelling, sleeping 3-4 hours a night maybe.  Pt states he is to see new Primary Care MD Dr. [name] 7/18.   Objective:   ROS  Physical Exam  Constitutional: He is oriented to person, place, and time. He appears well-developed and well-nourished.  Cardiovascular: Normal rate.   Respiratory:  Slight congestion noted on expiration in RUL, bilateral LL posteriorly   GI: Soft.  Musculoskeletal: He exhibits edema.  + 2 edema bilateral legs/top of feet.     Neurological: He is alert and oriented to person, place, and time.  Skin: Skin is warm and dry.  Psychiatric: He has a normal mood and affect.   Filed Vitals:   03/26/15 1642  BP: 120/72  Pulse: 68  Resp: 20   O2 sat 94% on 2.5LNC Current Medications:   Current Outpatient Prescriptions  Medication Sig Dispense Refill  . colchicine 0.6 MG tablet 0.6 mg 2 (two) times daily. TAKE ONE TABLET BY MOUTH TWICE DAILY AS NEEDED FOR  WRIST  PAIN    . cyanocobalamin (,VITAMIN B-12,) 1000 MCG/ML injection Inject 1,000 mcg into the muscle every 30 (thirty) days.    Marland Kitchen gabapentin (NEURONTIN) 100 MG capsule Take 100 mg by mouth 2 (two) times daily.     . insulin NPH Human (HUMULIN N,NOVOLIN N) 100 UNIT/ML injection Inject 80 Units into the skin 2 (two)  times daily before a meal. 80 units subcutaneous 30 min prior to breakfast, 80 units subcutaneous 30 min prior to supper.    . insulin regular (NOVOLIN R,HUMULIN R) 100 units/mL injection Inject 10 Units into the skin 2 (two) times daily before a meal. 10 units 30 min prior to meal if BG >120 & eating    . lactulose (CHRONULAC) 10 GM/15ML solution Take 30 g by mouth 2 (two) times daily as needed for mild constipation or moderate constipation.    Marland Kitchen levothyroxine (SYNTHROID, LEVOTHROID) 100 MCG tablet Take 100 mcg by mouth daily. Patient takes this medication at 6am.    . metolazone (ZAROXOLYN) 5 MG tablet Take 5 mg by mouth 3 (three) times a week. Pt takes Monday, Wednesday, Friday    . PARoxetine (PAXIL) 20 MG tablet Take 20 mg by mouth daily.     . potassium chloride (K-DUR,KLOR-CON) 10 MEQ tablet Take 20 mEq by mouth daily. TAKE TWO TABLETS BY MOUTH ONCE DAILY    . pravastatin (PRAVACHOL) 40 MG tablet Take 40 mg by mouth at bedtime.     Marland Kitchen spironolactone (ALDACTONE) 25 MG tablet Take 25 mg by mouth daily.     . Tiotropium Bromide-Olodaterol 2.5-2.5 MCG/ACT AERS Inhale 1 spray into the lungs daily as needed (for wheezing and shortness  of breath.).    Marland Kitchen torsemide (DEMADEX) 20 MG tablet Take 20 mg by mouth 2 (two) times daily.     . vitamin B-12 (CYANOCOBALAMIN) 1000 MCG tablet Take 2,000 mcg by mouth daily.    Marland Kitchen warfarin (COUMADIN) 10 MG tablet Take 5-10 mg by mouth daily. Pt states 1 tablet orally Monday and Friday.  Take 1/2 tablet (42m) orally Tuesday, Wednesday, Thursday, Saturday, and Sunday.    . nystatin (MYCOSTATIN) powder Apply 1 g topically daily as needed (for rash.).      No current facility-administered medications for this visit.    Functional Status:   In your present state of health, do you have any difficulty performing the following activities: 02/16/2015 12/21/2014  Hearing? N N  Vision? N N  Difficulty concentrating or making decisions? N N  Walking or climbing stairs? Y Y   Dressing or bathing? N N  Doing errands, shopping? N Y  PConservation officer, natureand eating ? - N  Using the Toilet? - N  In the past six months, have you accidently leaked urine? - N  Do you have problems with loss of bowel control? - N  Managing your Medications? - Y  Managing your Finances? - N  Housekeeping or managing your Housekeeping? - N    Fall/Depression Screening:    PHQ 2/9 Scores 01/22/2015 12/21/2014  PHQ - 2 Score - 1  Exception Documentation (No Data) -    Assessment:   HF- view of pt's recording weights show a gain of 16 lbs within the last 2 weeks to which pt states coming from appetite increasing/cutting down on smoking/picked up snacking.                             DM- view of blood sugar readings 264 today, ranges 75-349, had 3 low readings ((33,29,51                            Plan:   Pt to have CT scan 7/15, breathing test 7/25.               Pt to see Dr SDoy Hutching7/18             Pt to continue to weight/record/call MD for weight gain of 3 lbs in a day/5 lbs in a week (other symptoms                  Reviewed)             Pt to f/u with sleep study             RN CM to continue to provide community nurse case management services, next visit 8/4.    [facility]        [place]  Active   [facility] Long Term Goal (31-90 days)  Pt to have son  purchase scale within next 31 days, [place]  01[place]22[place]15   [place]  -- [5/3- goal not met,  pt did not obtain scale yet ]   Interventions for Problem One Long Term Goal  Reinforced importance of pt weighing daily/recording.    [facility] CM Short Term Goal #1 Met Date  01/22/15 [pt states been  getting out more, swelling down little ]   [facility] CM Short Term Goal #2 Met Date  -- [goal met. ]   [facility] CM Short Term Goal #3 (0-30 days)  with increased activities, pt to elevate legs more to help bring   edema down    [facility] CM Short Term Goal #3 Start Date  01/22/15   Interventions for Short Tern Goal #3  Discussed with pt pacing activities, take time to elevate legs to help bring  edema  down     [facility] CM Care Plan Problem Two        Patient Outreach from 01/22/2015 in [place] Problem Two  Recent hospitalization- HF    Care Plan for Problem Two  Not Active   Interventions for Problem Two Long Term Goal   Discussed  with pt plan to f/u with weekly phone calls  30 days post discharge as part of transition of care    [name] Long Term Goal (31-90) days  Pt would not be readmitted in the next 31 days    [facility] Long Term Goal Start Date  11/09/14   [facility] Long Term Goal Met Date  12/06/14   [facility] CM Short Term Goal #1 (0-30 days)  pt would pick up new med from pharmacy in the next 1-2 days    [facility] CM Short Term Goal #1 Start Date  11/09/14   [facility] CM Short Term Goal #1 Met Date   11/16/14 [pt reported 2/26- had all of his meds. ]    [facility] CM Care Plan Problem Three        Patient Outreach from 03/26/2015 in [place] Problem Three  Self management of Heart failure    Care Plan for Problem Three  Active   [facility] Long Term Goal (31-90) days  Pt would be more familiar with yellow zone in HF action plan  in the next 60 days    [facility] Long Term Goal Start Date  03/26/15   Interventions for Problem Three Long Term Goal  Reviewed with pt the yellow zone in HF action plan as he has all the zones in [facility] calendar.   [facility] CM Short Term Goal #1 (0-30 days)  Pt would monitor intake of processed foods within the next 30 days    [facility] CM Short Term Goal #1 Start Date  03/26/15   Interventions for Short Term Goal #1  Reviewed with pt processed foods high in sodium, ex. hot dogs, limit intake.    [facility] CM Short Term Goal #2 (0-30 days)  Pt would continue to cut down on cigar smoking in the next 30 days    [facility] CM Short Term Goal #2 Start Date  03/26/15   Interventions for Short Term Goal #2   Discussed with pt cigar smoking effects the heart as well as lungs.                Zara Chess.   [place] [facility]  [phone]

## 2015-04-05 ENCOUNTER — Ambulatory Visit
Admission: RE | Admit: 2015-04-05 | Discharge: 2015-04-05 | Disposition: A | Discharge status: Home / Self Care | Payer: Commercial Managed Care - HMO | Source: Ambulatory Visit | Attending: Internal Medicine | Admitting: Internal Medicine

## 2015-04-05 DIAGNOSIS — I700 Atherosclerosis of aorta: Secondary | ICD-10-CM | POA: Insufficient documentation

## 2015-04-05 DIAGNOSIS — J9611 Chronic respiratory failure with hypoxia: Secondary | ICD-10-CM

## 2015-04-05 DIAGNOSIS — I313 Pericardial effusion (noninflammatory): Secondary | ICD-10-CM | POA: Diagnosis not present

## 2015-04-05 DIAGNOSIS — J9610 Chronic respiratory failure, unspecified whether with hypoxia or hypercapnia: Secondary | ICD-10-CM | POA: Insufficient documentation

## 2015-04-08 ENCOUNTER — Telehealth: Payer: Self-pay | Admitting: *Deleted

## 2015-04-08 NOTE — Telephone Encounter (Signed)
-----   Message from Stephanie AcreVishal Mungal, MD sent at 04/06/2015 11:29 PM EDT ----- Regarding: results Please inform patient that his CT chest is similar to the one in 2012, there are now new findings.   No masses, no tumors.  Findings consistent with heart failure and smoking.    Thanks

## 2015-04-09 ENCOUNTER — Emergency Department: Payer: Commercial Managed Care - HMO

## 2015-04-09 ENCOUNTER — Inpatient Hospital Stay
Admission: EM | Admit: 2015-04-09 | Discharge: 2015-04-19 | DRG: 292 | Disposition: A | Discharge status: Home Health | Payer: Commercial Managed Care - HMO | Attending: Internal Medicine | Admitting: Internal Medicine

## 2015-04-09 DIAGNOSIS — G4730 Sleep apnea, unspecified: Secondary | ICD-10-CM | POA: Diagnosis present

## 2015-04-09 DIAGNOSIS — R601 Generalized edema: Secondary | ICD-10-CM | POA: Diagnosis not present

## 2015-04-09 DIAGNOSIS — I5043 Acute on chronic combined systolic (congestive) and diastolic (congestive) heart failure: Secondary | ICD-10-CM | POA: Diagnosis not present

## 2015-04-09 DIAGNOSIS — F331 Major depressive disorder, recurrent, moderate: Secondary | ICD-10-CM | POA: Diagnosis present

## 2015-04-09 DIAGNOSIS — J45909 Unspecified asthma, uncomplicated: Secondary | ICD-10-CM | POA: Diagnosis present

## 2015-04-09 DIAGNOSIS — K5900 Constipation, unspecified: Secondary | ICD-10-CM | POA: Diagnosis present

## 2015-04-09 DIAGNOSIS — I129 Hypertensive chronic kidney disease with stage 1 through stage 4 chronic kidney disease, or unspecified chronic kidney disease: Secondary | ICD-10-CM | POA: Diagnosis present

## 2015-04-09 DIAGNOSIS — Z86718 Personal history of other venous thrombosis and embolism: Secondary | ICD-10-CM

## 2015-04-09 DIAGNOSIS — Z888 Allergy status to other drugs, medicaments and biological substances status: Secondary | ICD-10-CM

## 2015-04-09 DIAGNOSIS — E8770 Fluid overload, unspecified: Secondary | ICD-10-CM | POA: Diagnosis present

## 2015-04-09 DIAGNOSIS — J449 Chronic obstructive pulmonary disease, unspecified: Secondary | ICD-10-CM | POA: Diagnosis present

## 2015-04-09 DIAGNOSIS — Z713 Dietary counseling and surveillance: Secondary | ICD-10-CM | POA: Diagnosis present

## 2015-04-09 DIAGNOSIS — E871 Hypo-osmolality and hyponatremia: Secondary | ICD-10-CM | POA: Diagnosis present

## 2015-04-09 DIAGNOSIS — I313 Pericardial effusion (noninflammatory): Secondary | ICD-10-CM | POA: Diagnosis present

## 2015-04-09 DIAGNOSIS — E11649 Type 2 diabetes mellitus with hypoglycemia without coma: Secondary | ICD-10-CM | POA: Diagnosis present

## 2015-04-09 DIAGNOSIS — G4733 Obstructive sleep apnea (adult) (pediatric): Secondary | ICD-10-CM | POA: Diagnosis present

## 2015-04-09 DIAGNOSIS — N183 Chronic kidney disease, stage 3 (moderate): Secondary | ICD-10-CM | POA: Diagnosis present

## 2015-04-09 DIAGNOSIS — Z7901 Long term (current) use of anticoagulants: Secondary | ICD-10-CM

## 2015-04-09 DIAGNOSIS — I480 Paroxysmal atrial fibrillation: Secondary | ICD-10-CM | POA: Diagnosis present

## 2015-04-09 DIAGNOSIS — Z9119 Patient's noncompliance with other medical treatment and regimen: Secondary | ICD-10-CM | POA: Diagnosis present

## 2015-04-09 DIAGNOSIS — E876 Hypokalemia: Secondary | ICD-10-CM | POA: Diagnosis not present

## 2015-04-09 DIAGNOSIS — F17210 Nicotine dependence, cigarettes, uncomplicated: Secondary | ICD-10-CM | POA: Diagnosis present

## 2015-04-09 DIAGNOSIS — R188 Other ascites: Secondary | ICD-10-CM | POA: Diagnosis present

## 2015-04-09 DIAGNOSIS — I272 Other secondary pulmonary hypertension: Secondary | ICD-10-CM | POA: Diagnosis present

## 2015-04-09 DIAGNOSIS — N179 Acute kidney failure, unspecified: Secondary | ICD-10-CM | POA: Diagnosis present

## 2015-04-09 DIAGNOSIS — M109 Gout, unspecified: Secondary | ICD-10-CM | POA: Diagnosis present

## 2015-04-09 DIAGNOSIS — Z6841 Body Mass Index (BMI) 40.0 and over, adult: Secondary | ICD-10-CM

## 2015-04-09 DIAGNOSIS — Z79899 Other long term (current) drug therapy: Secondary | ICD-10-CM

## 2015-04-09 DIAGNOSIS — E039 Hypothyroidism, unspecified: Secondary | ICD-10-CM | POA: Diagnosis present

## 2015-04-09 DIAGNOSIS — E1122 Type 2 diabetes mellitus with diabetic chronic kidney disease: Secondary | ICD-10-CM | POA: Diagnosis present

## 2015-04-09 DIAGNOSIS — Z794 Long term (current) use of insulin: Secondary | ICD-10-CM

## 2015-04-09 DIAGNOSIS — I509 Heart failure, unspecified: Secondary | ICD-10-CM

## 2015-04-09 DIAGNOSIS — J9610 Chronic respiratory failure, unspecified whether with hypoxia or hypercapnia: Secondary | ICD-10-CM | POA: Diagnosis present

## 2015-04-09 DIAGNOSIS — E785 Hyperlipidemia, unspecified: Secondary | ICD-10-CM | POA: Diagnosis present

## 2015-04-09 HISTORY — DX: Essential (primary) hypertension: I10

## 2015-04-09 HISTORY — DX: Unspecified asthma, uncomplicated: J45.909

## 2015-04-09 LAB — URINALYSIS COMPLETE WITH MICROSCOPIC (ARMC ONLY)
Bacteria, UA: NONE SEEN
Bilirubin Urine: NEGATIVE
GLUCOSE, UA: NEGATIVE mg/dL
Ketones, ur: NEGATIVE mg/dL
LEUKOCYTES UA: NEGATIVE
Nitrite: NEGATIVE
PH: 7 (ref 5.0–8.0)
Protein, ur: 100 mg/dL — AB
SPECIFIC GRAVITY, URINE: 1.005 (ref 1.005–1.030)

## 2015-04-09 LAB — COMPREHENSIVE METABOLIC PANEL
ALK PHOS: 228 U/L — AB (ref 38–126)
ALT: 9 U/L — ABNORMAL LOW (ref 17–63)
AST: 22 U/L (ref 15–41)
Albumin: 2.8 g/dL — ABNORMAL LOW (ref 3.5–5.0)
Anion gap: 13 (ref 5–15)
BILIRUBIN TOTAL: 0.9 mg/dL (ref 0.3–1.2)
BUN: 28 mg/dL — AB (ref 6–20)
CALCIUM: 8.7 mg/dL — AB (ref 8.9–10.3)
CO2: 29 mmol/L (ref 22–32)
CREATININE: 1.36 mg/dL — AB (ref 0.61–1.24)
Chloride: 88 mmol/L — ABNORMAL LOW (ref 101–111)
GFR calc Af Amer: >60 mL/min (ref >60)
GFR, EST NON AFRICAN AMERICAN: 55 mL/min — AB (ref >60)
Glucose, Bld: 190 mg/dL — ABNORMAL HIGH (ref 65–99)
POTASSIUM: 3.4 mmol/L — AB (ref 3.5–5.1)
SODIUM: 130 mmol/L — AB (ref 135–145)
TOTAL PROTEIN: 6.9 g/dL (ref 6.5–8.1)

## 2015-04-09 LAB — CBC
HCT: 35.4 % — ABNORMAL LOW (ref 40.0–52.0)
HEMOGLOBIN: 11.7 g/dL — AB (ref 13.0–18.0)
MCH: 26.9 pg (ref 26.0–34.0)
MCHC: 33 g/dL (ref 32.0–36.0)
MCV: 81.7 fL (ref 80.0–100.0)
Platelets: 231 10*3/uL (ref 150–440)
RBC: 4.33 MIL/uL — ABNORMAL LOW (ref 4.40–5.90)
RDW: 17 % — ABNORMAL HIGH (ref 11.5–14.5)
WBC: 5.9 10*3/uL (ref 3.8–10.6)

## 2015-04-09 LAB — LIPASE, BLOOD: Lipase: 19 U/L — ABNORMAL LOW (ref 22–51)

## 2015-04-09 MED ORDER — ACETAMINOPHEN 650 MG RE SUPP: 650.0000 mg | Freq: Four times a day (QID) | RECTAL | Status: DC | PRN

## 2015-04-09 MED ORDER — ONDANSETRON HCL 4 MG/2ML IJ SOLN
4.0000 mg | Freq: Once | INTRAMUSCULAR | Status: AC
  Administered 2015-04-09: 4 mg via INTRAVENOUS
  Filled 2015-04-09: qty 2

## 2015-04-09 MED ORDER — IOHEXOL 240 MG/ML SOLN
25.0000 mL | Freq: Once | INTRAMUSCULAR | Status: AC | PRN
  Administered 2015-04-09: 25 mL via ORAL

## 2015-04-09 MED ORDER — IOHEXOL 350 MG/ML SOLN
100.0000 mL | Freq: Once | INTRAVENOUS | Status: AC | PRN
  Administered 2015-04-09: 100 mL via INTRAVENOUS

## 2015-04-09 MED ORDER — ACETAMINOPHEN 325 MG PO TABS
650.0000 mg | ORAL_TABLET | Freq: Four times a day (QID) | ORAL | Status: DC | PRN
  Administered 2015-04-10 – 2015-04-17 (×10): 650 mg via ORAL
  Filled 2015-04-09 (×10): qty 2

## 2015-04-09 MED ORDER — MORPHINE SULFATE 4 MG/ML IJ SOLN
4.0000 mg | Freq: Once | INTRAMUSCULAR | Status: AC
  Administered 2015-04-09: 4 mg via INTRAVENOUS
  Filled 2015-04-09: qty 1

## 2015-04-09 MED ORDER — ONDANSETRON HCL 4 MG/2ML IJ SOLN: 4.0000 mg | Freq: Four times a day (QID) | INTRAMUSCULAR | Status: DC | PRN

## 2015-04-09 MED ORDER — SODIUM CHLORIDE 0.9 % IJ SOLN
3.0000 mL | Freq: Two times a day (BID) | INTRAMUSCULAR | Status: DC
Start: 2015-04-09 — End: 2015-04-10
  Administered 2015-04-10 (×2): 3 mL via INTRAVENOUS

## 2015-04-09 MED ORDER — FUROSEMIDE 10 MG/ML IJ SOLN
80.0000 mg | Freq: Once | INTRAMUSCULAR | Status: AC
  Administered 2015-04-09: 80 mg via INTRAVENOUS
  Filled 2015-04-09: qty 8

## 2015-04-09 MED ORDER — ONDANSETRON HCL 4 MG PO TABS: 4.0000 mg | ORAL_TABLET | Freq: Four times a day (QID) | ORAL | Status: DC | PRN

## 2015-04-09 MED ORDER — MORPHINE SULFATE 2 MG/ML IJ SOLN
2.0000 mg | INTRAMUSCULAR | Status: DC | PRN
  Administered 2015-04-11 – 2015-04-14 (×3): 2 mg via INTRAVENOUS
  Filled 2015-04-09 (×3): qty 1

## 2015-04-09 NOTE — ED Provider Notes (Signed)
[facility] Emergency Department Provider Note  Time seen: 9:12 PM  I have reviewed the triage vital signs and the nursing notes.   HISTORY  Chief Complaint Abdominal Pain and Constipation    HPI Tyler [name] is a 59 y.o. male with a past medical history of COPD, CHF, diabetes, CK D, morbid obesity, presents to the emergency department for 3 weeks of worsening abdominal pain. According to the patient for the past 3 weeks he has had abdominal swelling, and worsening pain. He states he has gained over 40 pounds in the past one month due to fluid accumulation in his abdomen and lower extremities. Patient denies any nausea or vomiting. Patient's last bowel movement was 3 days ago. He has been taking lactulose twice daily at home for constipation without result. He also states he has not had any flatulence. Denies fever. Patient is on Coumadin, due to chronic DVTs and IVC filter. Patient describes his abdominal pain is moderate to severe, diffuse, cannot decipher an area more tender than any other history.     Past Medical History  Diagnosis Date  . Cataract   . CHF (congestive heart failure)   . COPD (chronic obstructive pulmonary disease)   . Diabetes mellitus without complication   . Sleep apnea   . Oxygen deficiency   . Thyroid disease   . Gout   . Hypothyroidism   . CKD (chronic kidney disease) stage 3, GFR 30-59 ml/min     Patient Active Problem List   Diagnosis Date Noted  . COPD, moderate 03/12/2015  . Tobacco abuse 03/12/2015  . Erroneous encounter - disregard 02/21/2015  . Acute CHF 02/16/2015  . Hyponatremia 02/16/2015  . A-fib 02/16/2015  . Chronic respiratory failure 02/16/2015    Past Surgical History  Procedure Laterality Date  . Cholecystectomy      2002  . Eye surgery      right cataract removed 3/21  . Vena cava filter placement      Current Outpatient Rx  Name  Route  Sig  Dispense  Refill  . colchicine 0.6 MG  tablet      0.6 mg 2 (two) times daily. TAKE ONE TABLET BY MOUTH TWICE DAILY AS NEEDED FOR  WRIST  PAIN         . cyanocobalamin (,VITAMIN B-12,) 1000 MCG/ML injection   Intramuscular   Inject 1,000 mcg into the muscle every 30 (thirty) days.         Marland Kitchen gabapentin (NEURONTIN) 100 MG capsule   Oral   Take 100 mg by mouth 2 (two) times daily.          . insulin NPH Human (HUMULIN N,NOVOLIN N) 100 UNIT/ML injection   Subcutaneous   Inject 80 Units into the skin 2 (two) times daily before a meal. 80 units subcutaneous 30 min prior to breakfast, 80 units subcutaneous 30 min prior to supper.         . insulin regular (NOVOLIN R,HUMULIN R) 100 units/mL injection   Subcutaneous   Inject 10 Units into the skin 2 (two) times daily before a meal. 10 units 30 min prior to meal if BG >120 & eating         . lactulose (CHRONULAC) 10 GM/15ML solution   Oral   Take 30 g by mouth 2 (two) times daily as needed for mild constipation or moderate constipation.         Marland Kitchen levothyroxine (SYNTHROID, LEVOTHROID) 100 MCG tablet   Oral  Take 100 mcg by mouth daily. Patient takes this medication at 6am.         . metolazone (ZAROXOLYN) 5 MG tablet   Oral   Take 5 mg by mouth 3 (three) times a week. Pt takes Monday, Wednesday, Friday         . nystatin (MYCOSTATIN) powder   Topical   Apply 1 g topically daily as needed (for rash.).          Marland Kitchen. PARoxetine (PAXIL) 20 MG tablet   Oral   Take 20 mg by mouth daily.          . potassium chloride (K-DUR,KLOR-CON) 10 MEQ tablet   Oral   Take 20 mEq by mouth daily. TAKE TWO TABLETS BY MOUTH ONCE DAILY         . pravastatin (PRAVACHOL) 40 MG tablet   Oral   Take 40 mg by mouth at bedtime.          Marland Kitchen. spironolactone (ALDACTONE) 25 MG tablet   Oral   Take 25 mg by mouth daily.          . Tiotropium Bromide-Olodaterol 2.5-2.5 MCG/ACT AERS   Inhalation   Inhale 1 spray into the lungs daily as needed (for wheezing and shortness of  breath.).         Marland Kitchen. torsemide (DEMADEX) 20 MG tablet   Oral   Take 20 mg by mouth 2 (two) times daily.          . vitamin B-12 (CYANOCOBALAMIN) 1000 MCG tablet   Oral   Take 2,000 mcg by mouth daily.         Marland Kitchen. warfarin (COUMADIN) 10 MG tablet   Oral   Take 5-10 mg by mouth daily. Pt states 1 tablet orally Monday and Friday.  Take 1/2 tablet (5mg ) orally Tuesday, Wednesday, Thursday, Saturday, and Sunday.           Allergies Enalapril; Lovastatin; and Atorvastatin  Family History  Problem Relation Age of Onset  . Diabetes Sister   . Diabetes Brother     Social History History  Substance Use Topics  . Smoking status: Current Every Day Smoker -- 2.00 packs/day for 30 years    Types: Cigarettes, Cigars  . Smokeless tobacco: Never Used     Comment: cigar 3-4 yrs 1-3 per day/former Cigarettes  . Alcohol Use: No    Review of Systems Constitutional: Negative for fever. Cardiovascular: Negative for chest pain. Respiratory: Negative for shortness of breath. Gastrointestinal: Positive for abdominal pain. Diffuse. Negative for nausea or vomiting. Positive for constipation. Genitourinary: Positive for dysuria. Neurological: Negative for headache 10-point ROS otherwise negative.  ____________________________________________   PHYSICAL EXAM:  VITAL SIGNS: ED Triage Vitals  Enc Vitals Group     BP 04/09/15 1858 134/79 mmHg     Pulse Rate 04/09/15 1858 64     Resp 04/09/15 1858 18     Temp 04/09/15 1858 97.9 F (36.6 C)     Temp Source 04/09/15 1858 Oral     SpO2 04/09/15 1858 94 %     Weight 04/09/15 1858 372 lb (168.738 kg)     Height 04/09/15 1858 6' (1.829 m)     Head Cir --      Peak Flow --      Pain Score 04/09/15 1859 10     Pain Loc --      Pain Edu? --      Excl. in GC? --     Constitutional:  Alert and oriented. Mild distress due to abdominal pain. Eyes: Normal exam ENT   Mouth/Throat: Mucous membranes are moist. Cardiovascular: Normal  rate, regular rhythm. Respiratory: Normal respiratory effort without tachypnea nor retractions. Breath sounds are clear  Gastrointestinal: Somewhat distended, morbidly obese abdomen. Diffuse abdominal tenderness to palpation. No apparent rebound or guarding. Musculoskeletal: Patient can move all extremities, 3-4+ lower extremity edema bilaterally which the patient states is slightly increased versus is normal. Neurologic:  Normal speech and language. No gross focal neurologic deficits are appreciated. Speech is normal. Skin:  Skin is warm, dry and intact. Abdominal skin appears somewhat erythematous. Psychiatric: Mood and affect are normal. Speech and behavior are normal. Patient exhibits appropriate insight and judgment.  ____________________________________________   RADIOLOGY   IMPRESSION: Pericardial and pleural effusions with anasarca and ascites.  Mild interval progression of a mild T11 compression deformity.  ____________________________________________    INITIAL IMPRESSION / ASSESSMENT AND PLAN / ED COURSE  Pertinent labs & imaging results that were available during my care of the patient were reviewed by me and considered in my medical decision making (see chart for details).  Patient with diffuse abdominal pain 3 weeks which she states is worsening. Now with constipation 3 days, with no flatulence. Patient's labs are largely within normal limits/at his baseline. White blood cell count is normal. Urinalysis is largely normal as well. We'll obtain a CT scan of the abdomen and pelvis. Patient appears to be under our 400 pound weight limit.  CT shows diffuse anasarca, abdominal ascites, pleural effusions, pericardial effusions. Patient with fluid overload, 40 pound weight gain in the past 3-4 weeks. Given increased abdominal pain likely due to abdominal distention from ascites, we will admit the patient for further workup and  evaluation.  ____________________________________________   FINAL CLINICAL IMPRESSION(S) / ED DIAGNOSES  Abdominal pain Constipation Anasarca   Minna Antis, MD 04/09/15 2252

## 2015-04-09 NOTE — ED Notes (Signed)
Pt to ED from home c/o diffuse lower abd pain x3 weeks.  Pt states pain as pressure.  Denies n/v/d.  Reports burning with urination.  Not passing flatus, constipated at home.  Pt also states gained 40lbs in one month.  Hx of CHF.

## 2015-04-09 NOTE — ED Notes (Signed)
Pt returned from CT °

## 2015-04-09 NOTE — ED Notes (Signed)
Pt reports to ED from home w/ c/o constipation and abdominal pain.  Pt sts he has gained approx 40 pounds in last 2 months.  Pt sts he hasn't had BM in 3-4 days.  Pt sts this his pain started 3 wks and has gradually gotten worse.

## 2015-04-10 ENCOUNTER — Inpatient Hospital Stay: Admit: 2015-04-10 | Payer: Commercial Managed Care - HMO

## 2015-04-10 ENCOUNTER — Encounter: Payer: Self-pay | Admitting: Internal Medicine

## 2015-04-10 ENCOUNTER — Inpatient Hospital Stay: Payer: Commercial Managed Care - HMO

## 2015-04-10 DIAGNOSIS — F17210 Nicotine dependence, cigarettes, uncomplicated: Secondary | ICD-10-CM | POA: Diagnosis present

## 2015-04-10 DIAGNOSIS — E8770 Fluid overload, unspecified: Secondary | ICD-10-CM | POA: Diagnosis present

## 2015-04-10 DIAGNOSIS — Z6841 Body Mass Index (BMI) 40.0 and over, adult: Secondary | ICD-10-CM | POA: Diagnosis not present

## 2015-04-10 DIAGNOSIS — E785 Hyperlipidemia, unspecified: Secondary | ICD-10-CM | POA: Diagnosis present

## 2015-04-10 DIAGNOSIS — E1122 Type 2 diabetes mellitus with diabetic chronic kidney disease: Secondary | ICD-10-CM | POA: Diagnosis present

## 2015-04-10 DIAGNOSIS — N183 Chronic kidney disease, stage 3 (moderate): Secondary | ICD-10-CM | POA: Diagnosis present

## 2015-04-10 DIAGNOSIS — Z86718 Personal history of other venous thrombosis and embolism: Secondary | ICD-10-CM | POA: Diagnosis not present

## 2015-04-10 DIAGNOSIS — R601 Generalized edema: Secondary | ICD-10-CM | POA: Diagnosis present

## 2015-04-10 DIAGNOSIS — G4730 Sleep apnea, unspecified: Secondary | ICD-10-CM | POA: Diagnosis present

## 2015-04-10 DIAGNOSIS — K5900 Constipation, unspecified: Secondary | ICD-10-CM | POA: Diagnosis present

## 2015-04-10 DIAGNOSIS — I313 Pericardial effusion (noninflammatory): Secondary | ICD-10-CM | POA: Diagnosis present

## 2015-04-10 DIAGNOSIS — I509 Heart failure, unspecified: Secondary | ICD-10-CM

## 2015-04-10 DIAGNOSIS — Z79899 Other long term (current) drug therapy: Secondary | ICD-10-CM | POA: Diagnosis not present

## 2015-04-10 DIAGNOSIS — I272 Other secondary pulmonary hypertension: Secondary | ICD-10-CM | POA: Diagnosis present

## 2015-04-10 DIAGNOSIS — R188 Other ascites: Secondary | ICD-10-CM | POA: Diagnosis present

## 2015-04-10 DIAGNOSIS — J45909 Unspecified asthma, uncomplicated: Secondary | ICD-10-CM | POA: Diagnosis present

## 2015-04-10 DIAGNOSIS — I480 Paroxysmal atrial fibrillation: Secondary | ICD-10-CM | POA: Diagnosis present

## 2015-04-10 DIAGNOSIS — G4733 Obstructive sleep apnea (adult) (pediatric): Secondary | ICD-10-CM | POA: Diagnosis present

## 2015-04-10 DIAGNOSIS — J9610 Chronic respiratory failure, unspecified whether with hypoxia or hypercapnia: Secondary | ICD-10-CM | POA: Diagnosis present

## 2015-04-10 DIAGNOSIS — M109 Gout, unspecified: Secondary | ICD-10-CM | POA: Diagnosis present

## 2015-04-10 DIAGNOSIS — N179 Acute kidney failure, unspecified: Secondary | ICD-10-CM | POA: Diagnosis present

## 2015-04-10 DIAGNOSIS — Z713 Dietary counseling and surveillance: Secondary | ICD-10-CM | POA: Diagnosis present

## 2015-04-10 DIAGNOSIS — F331 Major depressive disorder, recurrent, moderate: Secondary | ICD-10-CM | POA: Diagnosis present

## 2015-04-10 DIAGNOSIS — Z888 Allergy status to other drugs, medicaments and biological substances status: Secondary | ICD-10-CM | POA: Diagnosis not present

## 2015-04-10 DIAGNOSIS — E039 Hypothyroidism, unspecified: Secondary | ICD-10-CM | POA: Diagnosis present

## 2015-04-10 DIAGNOSIS — J449 Chronic obstructive pulmonary disease, unspecified: Secondary | ICD-10-CM | POA: Diagnosis present

## 2015-04-10 DIAGNOSIS — E876 Hypokalemia: Secondary | ICD-10-CM | POA: Diagnosis not present

## 2015-04-10 DIAGNOSIS — E871 Hypo-osmolality and hyponatremia: Secondary | ICD-10-CM | POA: Diagnosis present

## 2015-04-10 DIAGNOSIS — Z9119 Patient's noncompliance with other medical treatment and regimen: Secondary | ICD-10-CM | POA: Diagnosis present

## 2015-04-10 DIAGNOSIS — I129 Hypertensive chronic kidney disease with stage 1 through stage 4 chronic kidney disease, or unspecified chronic kidney disease: Secondary | ICD-10-CM | POA: Diagnosis present

## 2015-04-10 DIAGNOSIS — E11649 Type 2 diabetes mellitus with hypoglycemia without coma: Secondary | ICD-10-CM | POA: Diagnosis present

## 2015-04-10 DIAGNOSIS — I5043 Acute on chronic combined systolic (congestive) and diastolic (congestive) heart failure: Secondary | ICD-10-CM | POA: Diagnosis present

## 2015-04-10 DIAGNOSIS — Z794 Long term (current) use of insulin: Secondary | ICD-10-CM | POA: Diagnosis not present

## 2015-04-10 DIAGNOSIS — Z7901 Long term (current) use of anticoagulants: Secondary | ICD-10-CM | POA: Diagnosis not present

## 2015-04-10 LAB — BASIC METABOLIC PANEL
Anion gap: 11 (ref 5–15)
BUN: 28 mg/dL — AB (ref 6–20)
CO2: 31 mmol/L (ref 22–32)
CREATININE: 1.42 mg/dL — AB (ref 0.61–1.24)
Calcium: 8.4 mg/dL — ABNORMAL LOW (ref 8.9–10.3)
Chloride: 88 mmol/L — ABNORMAL LOW (ref 101–111)
GFR calc non Af Amer: 53 mL/min — ABNORMAL LOW (ref >60)
Glucose, Bld: 157 mg/dL — ABNORMAL HIGH (ref 65–99)
POTASSIUM: 3.3 mmol/L — AB (ref 3.5–5.1)
SODIUM: 130 mmol/L — AB (ref 135–145)

## 2015-04-10 LAB — GLUCOSE, CAPILLARY
GLUCOSE-CAPILLARY: 167 mg/dL — AB (ref 65–99)
GLUCOSE-CAPILLARY: 228 mg/dL — AB (ref 65–99)
Glucose-Capillary: 160 mg/dL — ABNORMAL HIGH (ref 65–99)
Glucose-Capillary: 157 mg/dL — ABNORMAL HIGH (ref 65–99)
Glucose-Capillary: 234 mg/dL — ABNORMAL HIGH (ref 65–99)

## 2015-04-10 LAB — CBC
HCT: 32.8 % — ABNORMAL LOW (ref 40.0–52.0)
Hemoglobin: 10.7 g/dL — ABNORMAL LOW (ref 13.0–18.0)
MCH: 26.8 pg (ref 26.0–34.0)
MCHC: 32.8 g/dL (ref 32.0–36.0)
MCV: 81.8 fL (ref 80.0–100.0)
Platelets: 210 10*3/uL (ref 150–440)
RBC: 4 MIL/uL — ABNORMAL LOW (ref 4.40–5.90)
RDW: 16.8 % — ABNORMAL HIGH (ref 11.5–14.5)
WBC: 4.5 10*3/uL (ref 3.8–10.6)

## 2015-04-10 LAB — TROPONIN I
Troponin I: 0.03 ng/mL (ref <0.031)
Troponin I: <0.03 ng/mL (ref <0.031)
Troponin I: <0.03 ng/mL (ref <0.031)

## 2015-04-10 LAB — PROTIME-INR
INR: 2.49
INR: 2.58
PROTHROMBIN TIME: 27 s — AB (ref 11.4–15.0)
Prothrombin Time: 27.8 seconds — ABNORMAL HIGH (ref 11.4–15.0)

## 2015-04-10 LAB — TSH: TSH: 9.007 u[IU]/mL — ABNORMAL HIGH (ref 0.350–4.500)

## 2015-04-10 MED ORDER — SPIRONOLACTONE 25 MG PO TABS
25.0000 mg | ORAL_TABLET | Freq: Every day | ORAL | Status: DC
  Administered 2015-04-10 – 2015-04-18 (×9): 25 mg via ORAL
  Filled 2015-04-10 (×9): qty 1

## 2015-04-10 MED ORDER — POLYETHYLENE GLYCOL 3350 17 G PO PACK
17.0000 g | PACK | Freq: Every day | ORAL | Status: DC
  Administered 2015-04-11 – 2015-04-18 (×8): 17 g via ORAL
  Filled 2015-04-10 (×9): qty 1

## 2015-04-10 MED ORDER — SODIUM CHLORIDE 0.9 % IJ SOLN
3.0000 mL | INTRAMUSCULAR | Status: DC | PRN
Start: 2015-04-10 — End: 2015-04-19
  Administered 2015-04-15: 3 mL via INTRAVENOUS
  Filled 2015-04-10: qty 10

## 2015-04-10 MED ORDER — IPRATROPIUM-ALBUTEROL 0.5-2.5 (3) MG/3ML IN SOLN: 3.0000 mL | RESPIRATORY_TRACT | Status: DC | PRN

## 2015-04-10 MED ORDER — DEXTROSE 5 % IV SOLN
1.0000 g | INTRAVENOUS | Status: DC
  Administered 2015-04-10 – 2015-04-12 (×3): 1 g via INTRAVENOUS
  Filled 2015-04-10 (×5): qty 10

## 2015-04-10 MED ORDER — FUROSEMIDE 10 MG/ML IJ SOLN: 80.0000 mg | Freq: Two times a day (BID) | INTRAMUSCULAR | Status: DC

## 2015-04-10 MED ORDER — VITAMIN B-12 1000 MCG PO TABS
2000.0000 ug | ORAL_TABLET | Freq: Every day | ORAL | Status: DC
  Administered 2015-04-10 – 2015-04-18 (×9): 2000 ug via ORAL
  Filled 2015-04-10 (×9): qty 2

## 2015-04-10 MED ORDER — WARFARIN - PHARMACIST DOSING INPATIENT: Freq: Every day | Status: DC

## 2015-04-10 MED ORDER — LEVOTHYROXINE SODIUM 100 MCG PO TABS
100.0000 ug | ORAL_TABLET | Freq: Every day | ORAL | Status: DC
  Administered 2015-04-10 – 2015-04-18 (×9): 100 ug via ORAL
  Filled 2015-04-10 (×9): qty 1

## 2015-04-10 MED ORDER — FUROSEMIDE 10 MG/ML IJ SOLN
120.0000 mg | Freq: Once | INTRAVENOUS | Status: AC
  Administered 2015-04-10: 120 mg via INTRAVENOUS
  Filled 2015-04-10: qty 12

## 2015-04-10 MED ORDER — LACTULOSE 10 GM/15ML PO SOLN
30.0000 g | Freq: Two times a day (BID) | ORAL | Status: DC | PRN
  Administered 2015-04-13 (×2): 30 g via ORAL
  Filled 2015-04-10 (×2): qty 60

## 2015-04-10 MED ORDER — POTASSIUM CHLORIDE 20 MEQ PO PACK
20.0000 meq | PACK | Freq: Two times a day (BID) | ORAL | Status: DC
  Administered 2015-04-10 – 2015-04-12 (×6): 20 meq via ORAL
  Filled 2015-04-10 (×7): qty 1

## 2015-04-10 MED ORDER — IPRATROPIUM-ALBUTEROL 0.5-2.5 (3) MG/3ML IN SOLN
3.0000 mL | Freq: Four times a day (QID) | RESPIRATORY_TRACT | Status: DC
  Administered 2015-04-10 – 2015-04-13 (×10): 3 mL via RESPIRATORY_TRACT
  Filled 2015-04-10 (×16): qty 3

## 2015-04-10 MED ORDER — WARFARIN SODIUM 5 MG PO TABS
10.0000 mg | ORAL_TABLET | ORAL | Status: DC
  Administered 2015-04-12 – 2015-04-15 (×2): 10 mg via ORAL
  Filled 2015-04-10 (×3): qty 2

## 2015-04-10 MED ORDER — METOLAZONE 5 MG PO TABS
5.0000 mg | ORAL_TABLET | Freq: Every day | ORAL | Status: DC
  Administered 2015-04-10 – 2015-04-11 (×2): 5 mg via ORAL
  Filled 2015-04-10 (×2): qty 1

## 2015-04-10 MED ORDER — WARFARIN SODIUM 5 MG PO TABS
5.0000 mg | ORAL_TABLET | ORAL | Status: DC
  Administered 2015-04-10 – 2015-04-18 (×7): 5 mg via ORAL
  Filled 2015-04-10 (×6): qty 1

## 2015-04-10 MED ORDER — CEFTRIAXONE SODIUM IN DEXTROSE 20 MG/ML IV SOLN
1.0000 g | INTRAVENOUS | Status: DC
  Filled 2015-04-10: qty 50

## 2015-04-10 MED ORDER — CYANOCOBALAMIN 1000 MCG/ML IJ SOLN
1000.0000 ug | INTRAMUSCULAR | Status: DC
  Filled 2015-04-10: qty 1

## 2015-04-10 MED ORDER — INSULIN ASPART 100 UNIT/ML ~~LOC~~ SOLN
0.0000 [IU] | Freq: Every day | SUBCUTANEOUS | Status: DC
  Administered 2015-04-10 – 2015-04-18 (×3): 2 [IU] via SUBCUTANEOUS
  Filled 2015-04-10: qty 3
  Filled 2015-04-10 (×2): qty 2

## 2015-04-10 MED ORDER — TIOTROPIUM BROMIDE-OLODATEROL 2.5-2.5 MCG/ACT IN AERS: 1.0000 | INHALATION_SPRAY | Freq: Every day | RESPIRATORY_TRACT | Status: DC | PRN

## 2015-04-10 MED ORDER — INSULIN DETEMIR 100 UNIT/ML ~~LOC~~ SOLN
80.0000 [IU] | Freq: Two times a day (BID) | SUBCUTANEOUS | Status: DC
  Administered 2015-04-10 – 2015-04-11 (×2): 80 [IU] via SUBCUTANEOUS
  Filled 2015-04-10 (×5): qty 0.8

## 2015-04-10 MED ORDER — GABAPENTIN 100 MG PO CAPS
100.0000 mg | ORAL_CAPSULE | Freq: Two times a day (BID) | ORAL | Status: DC
  Administered 2015-04-10 – 2015-04-18 (×18): 100 mg via ORAL
  Filled 2015-04-10 (×22): qty 1

## 2015-04-10 MED ORDER — PRAVASTATIN SODIUM 20 MG PO TABS
40.0000 mg | ORAL_TABLET | Freq: Every day | ORAL | Status: DC
  Administered 2015-04-10 – 2015-04-18 (×9): 40 mg via ORAL
  Filled 2015-04-10 (×9): qty 2

## 2015-04-10 MED ORDER — PAROXETINE HCL 10 MG PO TABS
20.0000 mg | ORAL_TABLET | Freq: Every day | ORAL | Status: DC
  Administered 2015-04-10 – 2015-04-18 (×9): 20 mg via ORAL
  Filled 2015-04-10 (×9): qty 2

## 2015-04-10 MED ORDER — INSULIN ASPART 100 UNIT/ML ~~LOC~~ SOLN
0.0000 [IU] | Freq: Three times a day (TID) | SUBCUTANEOUS | Status: DC
  Administered 2015-04-10: 5 [IU] via SUBCUTANEOUS
  Administered 2015-04-12: 2 [IU] via SUBCUTANEOUS
  Administered 2015-04-13 (×3): 3 [IU] via SUBCUTANEOUS
  Administered 2015-04-14: 2 [IU] via SUBCUTANEOUS
  Administered 2015-04-14 – 2015-04-15 (×5): 3 [IU] via SUBCUTANEOUS
  Administered 2015-04-16: 2 [IU] via SUBCUTANEOUS
  Administered 2015-04-16 – 2015-04-18 (×8): 3 [IU] via SUBCUTANEOUS
  Filled 2015-04-10: qty 5
  Filled 2015-04-10 (×2): qty 3
  Filled 2015-04-10: qty 2
  Filled 2015-04-10 (×5): qty 3
  Filled 2015-04-10: qty 2
  Filled 2015-04-10 (×2): qty 3
  Filled 2015-04-10 (×3): qty 2
  Filled 2015-04-10 (×6): qty 3

## 2015-04-10 NOTE — Care Management (Signed)
Patient presents from home and is well known to this CM.  He has chronic 02 through [employer].  He has been followed by [facility] /  [facility] in the past and he is adamant that he does not want this service again because all of all "of the visits and phone calls."  He does say that he has home health nurse that sees him once a week and thinks it is through [facility].  Says it is not from [employer].  Patient is extremely edematous and dyspneic at rest with conversation.   He is admitted under observation and have spoken with attending order modified to inpatient.  Patient is for paracentesis.  He is agreeable to "consider" skilled nursing short term if it is needed and insurance approves.Informed by [facility] that patient is not being followed by that agency.  Will contact [employer] CM to see if insurance CM is following

## 2015-04-10 NOTE — H&P (Signed)
[facility] - [place] at [facility]   PATIENT NAME: Tyler [name]    MR#:  [id number]  DATE OF BIRTH:  [birth year]   DATE OF ADMISSION:  04/09/2015  PRIMARY CARE PHYSICIAN: Sparks  REQUESTING/REFERRING PHYSICIAN: Paduchowski  CHIEF COMPLAINT:   Chief Complaint  Patient presents with  . Abdominal Pain  . Constipation    HISTORY OF PRESENT ILLNESS:  Tyler [name]  is a 59 y.o. male with a known history of COPD, chronic respiratory failure 2.5 L nasal Baseline, type 2 diabetes mellitus requiring, moderate pulmonary hypertension presenting with abdominal pain. He states that he has had been having progressively worsening abdominal pain for approximately 2 months. He describes this as a generalized pain, stretching in quality, 4to 5/10 intensity, no worsening or relieving factors. He is testing approximately 40 pound weight gain over 2 months total duration, massive lower extremity edema, dyspnea on exertion he however denies any chest pain or orthopnea.  PAST MEDICAL HISTORY:   Past Medical History  Diagnosis Date  . Cataract   . CHF (congestive heart failure)   . COPD (chronic obstructive pulmonary disease)   . Diabetes mellitus without complication   . Sleep apnea   . Oxygen deficiency   . Thyroid disease   . Gout   . Hypothyroidism   . CKD (chronic kidney disease) stage 3, GFR 30-59 ml/min   . Asthma   . Hypertension     PAST SURGICAL HISTORY:   Past Surgical History  Procedure Laterality Date  . Cholecystectomy      2002  . Eye surgery      right cataract removed 3/21  . Vena cava filter placement      SOCIAL HISTORY:   History  Substance Use Topics  . Smoking status: Current Every Day Smoker -- 2.00 packs/day for 30 years    Types: Cigarettes, Cigars  . Smokeless tobacco: Never Used     Comment: cigar 3-4 yrs 1-3 per day/former Cigarettes  . Alcohol Use: No    FAMILY HISTORY:   Family History  Problem Relation  Age of Onset  . Diabetes Sister   . Diabetes Brother     DRUG ALLERGIES:   Allergies  Allergen Reactions  . Enalapril Cough  . Lovastatin Other (See Comments)    tachycardia  . Atorvastatin Palpitations    REVIEW OF SYSTEMS:  REVIEW OF SYSTEMS:  CONSTITUTIONAL: Denies fevers, chills, positive fatigue, weakness.  EYES: Denies blurred vision, double vision, or eye pain.  EARS, NOSE, THROAT: Denies tinnitus, ear pain, hearing loss.  RESPIRATORY: denies cough, positive shortness of breath, wheezing  CARDIOVASCULAR: Denies chest pain, palpitations, positive edema.  GASTROINTESTINAL: Denies nausea, vomiting, diarrhea, positive abdominal pain, constipation.  GENITOURINARY: Denies dysuria, hematuria.  ENDOCRINE: Denies nocturia or thyroid problems. HEMATOLOGIC AND LYMPHATIC: Denies easy bruising or bleeding.  SKIN: Denies rash or lesions.  MUSCULOSKELETAL: Denies pain in neck, back, shoulder, knees, hips, or further arthritic symptoms.  NEUROLOGIC: Denies paralysis, paresthesias.  PSYCHIATRIC: Denies anxiety or depressive symptoms. Otherwise full review of systems performed by me is negative.   MEDICATIONS AT HOME:   Prior to Admission medications   Medication Sig Start Date End Date Taking? Authorizing Provider  colchicine 0.6 MG tablet 0.6 mg 2 (two) times daily. TAKE ONE TABLET BY MOUTH TWICE DAILY AS NEEDED FOR  WRIST  PAIN 12/06/14   Historical Provider, MD  cyanocobalamin (,VITAMIN B-12,) 1000 MCG/ML injection Inject 1,000 mcg into the muscle every 30 (thirty) days.  Historical Provider, MD  gabapentin (NEURONTIN) 100 MG capsule Take 100 mg by mouth 2 (two) times daily.  10/23/14   Historical Provider, MD  insulin NPH Human (HUMULIN N,NOVOLIN N) 100 UNIT/ML injection Inject 80 Units into the skin 2 (two) times daily before a meal. 80 units subcutaneous 30 min prior to breakfast, 80 units subcutaneous 30 min prior to supper. 10/10/14   Historical Provider, MD  insulin regular  (NOVOLIN R,HUMULIN R) 100 units/mL injection Inject 10 Units into the skin 2 (two) times daily before a meal. 10 units 30 min prior to meal if BG >120 & eating 07/10/14   Historical Provider, MD  lactulose (CHRONULAC) 10 GM/15ML solution Take 30 g by mouth 2 (two) times daily as needed for mild constipation or moderate constipation.    Historical Provider, MD  levothyroxine (SYNTHROID, LEVOTHROID) 100 MCG tablet Take 100 mcg by mouth daily. Patient takes this medication at 6am. 08/02/14 08/02/15  Historical Provider, MD  metolazone (ZAROXOLYN) 5 MG tablet Take 5 mg by mouth daily. Pt takes Monday, Wednesday, Friday    Historical Provider, MD  nystatin (MYCOSTATIN) powder Apply 1 g topically daily as needed (for rash.).  11/09/14 11/09/15  Historical Provider, MD  PARoxetine (PAXIL) 20 MG tablet Take 20 mg by mouth daily.  08/02/14   Historical Provider, MD  potassium chloride (K-DUR,KLOR-CON) 10 MEQ tablet Take 20 mEq by mouth daily. TAKE TWO TABLETS BY MOUTH ONCE DAILY 11/22/14   Historical Provider, MD  pravastatin (PRAVACHOL) 40 MG tablet Take 40 mg by mouth at bedtime.  08/02/14   Historical Provider, MD  spironolactone (ALDACTONE) 25 MG tablet Take 25 mg by mouth daily.  11/09/14 11/09/15  Historical Provider, MD  Tiotropium Bromide-Olodaterol 2.5-2.5 MCG/ACT AERS Inhale 1 spray into the lungs daily as needed (for wheezing and shortness of breath.).    Historical Provider, MD  torsemide (DEMADEX) 20 MG tablet Take 20 mg by mouth 2 (two) times daily.  09/18/14 09/18/15  Historical Provider, MD  vitamin B-12 (CYANOCOBALAMIN) 1000 MCG tablet Take 2,000 mcg by mouth daily.    Historical Provider, MD  warfarin (COUMADIN) 10 MG tablet Take 5-10 mg by mouth daily. Pt states 1 tablet orally Monday and Friday.  Take 1/2 tablet (5mg ) orally Tuesday, Wednesday, Thursday, Saturday, and Sunday.    Historical Provider, MD      VITAL SIGNS:  Blood pressure 130/78, pulse 60, temperature 97.9 F (36.6 C),  temperature source Oral, resp. rate 16, height 6' (1.829 m), weight 372 lb (168.738 kg), SpO2 99 %.  PHYSICAL EXAMINATION:  VITAL SIGNS: Filed Vitals:   04/09/15 2300  BP: 130/78  Pulse: 60  Temp:   Resp: 16   GENERAL:58 y.o.male currently in no acute distress.  HEAD: Normocephalic, atraumatic.  EYES: Pupils equal, round, reactive to light. Extraocular muscles intact. No scleral icterus.  MOUTH: Moist mucosal membrane. Dentition poor. No abscess noted.  EAR, NOSE, THROAT: Clear without exudates. No external lesions.  NECK: Supple. No thyromegaly. No nodules. No JVD.  PULMONARY: Diminished breath sounds secondary to body habitus scant expiratory wheezing No use of accessory muscles, Good respiratory effort. good air entry bilaterally CHEST: Nontender to palpation.  CARDIOVASCULAR: S1 and S2. Irregular rate and rhythm. No murmurs, rubs, or gallops. Anasarca. Pedal pulses 2+ bilaterally.  GASTROINTESTINAL: Soft, nontender, tense with mild distention. No masses. Positive bowel sounds. No hepatosplenomegaly.  MUSCULOSKELETAL: No swelling, clubbing, or edema. Range of motion full in all extremities.  NEUROLOGIC: Cranial nerves II through XII are intact.  No gross focal neurological deficits. Sensation intact. Reflexes intact.  SKIN: No ulceration, lesions, rashes, or cyanosis. Skin warm and dry. Turgor intact.  PSYCHIATRIC: Mood, affect within normal limits. The patient is awake, alert and oriented x 3. Insight, judgment intact.    LABORATORY PANEL:   CBC  Recent Labs Lab 04/09/15 1906  WBC 5.9  HGB 11.7*  HCT 35.4*  PLT 231   ------------------------------------------------------------------------------------------------------------------  Chemistries   Recent Labs Lab 04/09/15 1906  NA 130*  K 3.4*  CL 88*  CO2 29  GLUCOSE 190*  BUN 28*  CREATININE 1.36*  CALCIUM 8.7*  AST 22  ALT 9*  ALKPHOS 228*  BILITOT 0.9    ------------------------------------------------------------------------------------------------------------------  Cardiac Enzymes No results for input(s): TROPONINI in the last 168 hours. ------------------------------------------------------------------------------------------------------------------  RADIOLOGY:  Ct Abdomen Pelvis W Contrast  04/09/2015   CLINICAL DATA:  Constipation and abdominal pain, 40 pound weight gain in last 2 months  EXAM: CT ABDOMEN AND PELVIS WITH CONTRAST  TECHNIQUE: Multidetector CT imaging of the abdomen and pelvis was performed using the standard protocol following bolus administration of intravenous contrast.  CONTRAST:  OMNIPAQUE IOHEXOL 350 MG/ML SOLN  COMPARISON:  CT scan 05/09/2014  FINDINGS: Lower chest: Moderate to large pericardial effusion, increased when compared to prior study. Small bilateral pleural effusions new or increased when compared to prior study. Visualized portions of the lung bases clear except for atelectasis left base.  Hepatobiliary: Status post cholecystectomy. No focal hepatic abnormalities.  Pancreas: Normal  Spleen: Normal  Adrenals/Urinary Tract: Adrenal glands are normal. Bilateral renal atrophy noted. Kidneys otherwise negative.  Stomach/Bowel: Negative  Vascular/Lymphatic: Inferior vena cava filter noted.  Reproductive: Appear to be absent  Other: Study limited by body habitus. Diffuse increased subcutaneous soft tissue attenuation suggesting anasarca. Moderate volume of ascites.  Musculoskeletal: Mild compression deformity of T11 appears mildly progressive when compared to prior study. No evidence of retropulsion.  IMPRESSION: Pericardial and pleural effusions with anasarca and ascites.  Mild interval progression of a mild T11 compression deformity.   Electronically Signed   By: Esperanza Heir M.D.   On: 04/09/2015 22:03   Dg Chest Portable 1 View  04/09/2015   CLINICAL DATA:  Chronic cough, with 40 lb weight gain. Initial  encounter.  EXAM: PORTABLE CHEST - 1 VIEW  COMPARISON:  CT of the chest performed 04/05/2015, and chest radiograph performed 02/16/2015  FINDINGS: The lungs are well-aerated. A small left pleural effusion is noted. Vascular congestion is noted, with increased interstitial markings, concerning for mild pulmonary edema. There is no evidence of pneumothorax.  The cardiomediastinal silhouette is mildly enlarged. No acute osseous abnormalities are seen.  IMPRESSION: Small left pleural effusion noted. Vascular congestion and mild cardiomegaly, with increased interstitial markings, concerning for mild pulmonary edema.   Electronically Signed   By: Roanna Raider M.D.   On: 04/09/2015 23:28    EKG:   Orders placed or performed during the hospital encounter of 02/16/15  . EKG 12-Lead  . EKG 12-Lead  . EKG 12-Lead  . EKG 12-Lead  . EKG 12-Lead  . EKG 12-Lead  . EKG  . EKG    IMPRESSION AND PLAN:   59 year old Caucasian gentleman history of moderate pulmonary hypertension, COPD with chronic respiratory failure presenting with abdominal pain and edema.  1. Anasarca: This appears to be to cardio vascular in etiology, given no real evidence of hepatic dysfunction or nephrotic range proteinuria. I question if this is related to worsening pulmonary hypertension he was previously  scheduled procedure sleep study as an outpatient was To do so. Will consult cardiology as he follows with Dr. Gwen PoundsKowalski, Treatment plan: Aggressive diuresis and follow ins and outs, daily weights, renal function 2. COPD with chronic respiratory failure not in acute exacerbation: Continue supplemental O2, and DuoNeb treatments 3. Type 2 diabetes insulin requiring: Continue basal insulin at insulin sliding scale with every 6 hours Accu-Cheks 4. Hypothyroidism unspecified: Check TSH as another source for anasarca, continue Synthroid 5. History of DVT on therapeutic warfarin: Check INR continue warfarin 6. Venous thromboembolism  prophylactic: Therapeutic warfarin   All the records are reviewed and case discussed with ED provider. Management plans discussed with the patient, family and they are in agreement.  CODE STATUS: Full  TOTAL TIME TAKING CARE OF THIS PATIENT: 50 minutes.    Hower,  Mardi MainlandDavid K M.D on 04/10/2015 at 12:06 AM  Between 7am to 6pm - Pager - [phone]  After 6pm: House Pager: - [phone]  [facility]  Office  [phone]  CC: Primary care physician; Mickey FarberHIES, DAVID, MD

## 2015-04-10 NOTE — Progress Notes (Signed)
Tyler [name] is a 59 y.o. male  Anasarca   SUBJECTIVE:  Pt admitted with anasarca and acute on chronic CHF with respiratory failure. More SOB but no CP. Cardiac enzymes NTD. On IV Lasix.  ______________________________________________________________________  ROS: Review of systems is unremarkable for any active cardiac,respiratory, GI, GU, hematologic, neurologic or psychiatric systems, 10 systems reviewed.  @CMEDLIST @  Past Medical History  Diagnosis Date  . Cataract   . CHF (congestive heart failure)   . COPD (chronic obstructive pulmonary disease)   . Diabetes mellitus without complication   . Sleep apnea   . Oxygen deficiency   . Thyroid disease   . Gout   . Hypothyroidism   . CKD (chronic kidney disease) stage 3, GFR 30-59 ml/min   . Asthma   . Hypertension     Past Surgical History  Procedure Laterality Date  . Cholecystectomy      2002  . Eye surgery      right cataract removed 3/21  . Vena cava filter placement      PHYSICAL EXAM:  BP 122/67 mmHg  Pulse 68  Temp(Src) 98.3 F (36.8 C) (Oral)  Resp 20  Ht 6' (1.829 m)  Wt 161.118 kg (355 lb 3.2 oz)  BMI 48.16 kg/m2  SpO2 96%  Wt Readings from Last 3 Encounters:  04/10/15 161.118 kg (355 lb 3.2 oz)  03/26/15 161.934 kg (357 lb)  03/12/15 152.862 kg (337 lb)            Constitutional: NAD Neck: supple, no thyromegaly Respiratory: basilar rales without wheezes or rhonchi Cardiovascular: irregularly irregular, no murmur, no gallop Abdomen: soft, good BS, nontender Extremities: 3+ edema Neuro: alert and oriented, no focal motor or sensory deficits  ASSESSMENT/PLAN:  Labs and imaging studies were reviewed  Will continue IV Lasix and resume Zaroxalyn. Add K+. Echo and Cardiology consult pending. Will proceed with US guided paracentesis for symptom relief. F/u labs in AM.

## 2015-04-10 NOTE — Clinical Social Work Note (Signed)
Home Health recommended.  No SNF placement needed at this time.  CSW signing off unless further needs arise.

## 2015-04-10 NOTE — Progress Notes (Signed)
Patient was admitted d/t recurrent fluid volume overload. Patient and wife was oriented to patient's room. Patient was A&O X4, denied pain N&V and have a non productive cough. On admission patient has a covered wound on his left foot, the old dressing was removed and new one was  Applied. Patient remained on 2L of oxygen at baseline. IV lasix was administered per order.

## 2015-04-10 NOTE — Evaluation (Addendum)
Physical Therapy Evaluation Patient Details Name: Tyler [name] MRN: [id number] DOB: 1956-01-17 Today's Date: 04/10/2015   History of Present Illness  Tyler [name] is a 59 y.o. male with a known history of COPD, chronic respiratory failure 2.5 L nasal Baseline, type 2 diabetes mellitus requiring, moderate pulmonary hypertension presenting with abdominal pain.  Clinical Impression  Pt demonstrates adequate mobility to return to home safely with [facility] PT. Unsure if patient will agree to [name] at discharge. He is relatively sedentary at home and is a high risk for readmission if he does not increase his baseline activity. Vitals remain WNL for all ambulation and no DOE noted during evaluation. Pt will benefit from skilled PT services to address deficits in strength, balance, and mobility in order to return to full function at home.     Follow Up Recommendations Home health PT (Unsure if patient will agree)    Equipment Recommendations  Other (comment) (Wheel kit for rolling walker)    Recommendations for Other Services       Precautions / Restrictions Precautions Precautions: None Restrictions Weight Bearing Restrictions: No      Mobility  Bed Mobility Overal bed mobility: Modified Independent             General bed mobility comments: Use of bed rails but adequate speed and safety noted  Transfers Overall transfer level: Needs assistance Equipment used: Rolling walker (2 wheeled) Transfers: Sit to/from Stand Sit to Stand: Min guard         General transfer comment: Good speed, stability, and strength noted  Ambulation/Gait Ambulation/Gait assistance: Min guard Ambulation Distance (Feet): 30 Feet Assistive device: Rolling walker (2 wheeled) Gait Pattern/deviations: Step-through pattern   Gait velocity interpretation: <1.8 ft/sec, indicative of risk for recurrent falls General Gait Details: Decreased step length but overall gait is functional for household  ambulation. Vitals monitored and remain WNL on 2.5 L/min supplemental O2. Fatigue monitored. No DOE reported or observed.   Stairs            Wheelchair Mobility    Modified Rankin (Stroke Patients Only)       Balance Overall balance assessment: Needs assistance   Sitting balance-Leahy Scale: Good       Standing balance-Leahy Scale: Good Standing balance comment: Unabel to attempt single leg stance. Rhomberg negative for increased sway. Forward reach approximately 7"                             Pertinent Vitals/Pain Pain Assessment: No/denies pain    Home Living Family/patient expects to be discharged to:: Private residence Living Arrangements: Children (son) Available Help at Discharge: Family Type of Home: Mobile home Home Access: Ramped entrance     Home Layout: One level Home Equipment: Walker - standard;Wheelchair - Sport and exercise psychologist Comments: No BSC, tub shower with seat    Prior Function Level of Independence: Independent with assistive device(s)         Comments: Household ambulator with standard walker occasionally     Hand Dominance        Extremity/Trunk Assessment   Upper Extremity Assessment: Overall WFL for tasks assessed (At least 4+/5 throughout)           Lower Extremity Assessment: Overall WFL for tasks assessed (Difficulty with ankle DF due to swelling)         Communication      Cognition Arousal/Alertness: Awake/alert Behavior During Therapy: WFL for tasks assessed/performed Overall  Cognitive Status: Within Functional Limits for tasks assessed                      General Comments      Exercises        Assessment/Plan    PT Assessment Patient needs continued PT services  PT Diagnosis Difficulty walking;Abnormality of gait;Generalized weakness   PT Problem List Decreased strength;Decreased activity tolerance;Decreased mobility;Obesity  PT Treatment Interventions DME  instruction;Functional mobility training;Therapeutic activities;Gait training;Therapeutic exercise;Balance training;Neuromuscular re-education   PT Goals (Current goals can be found in the Care Plan section) Acute Rehab PT Goals Patient Stated Goal: "I think I'm doing alright." Pt wants to improve strength and mobility PT Goal Formulation: With patient Time For Goal Achievement: 04/24/15 Potential to Achieve Goals: Good    Frequency Min 2X/week   Barriers to discharge        Co-evaluation               End of Session Equipment Utilized During Treatment: Gait belt;Oxygen Activity Tolerance: Patient tolerated treatment well Patient left: in bed (Refuses bed alarm. RN notified) Nurse Communication: Mobility status    Functional Assessment Tool Used: clinical judgement Functional Limitation: Mobility: Walking and moving around Mobility: Walking and Moving Around Current Status [phone]): At least 20 percent but less than 40 percent impaired, limited or restricted Mobility: Walking and Moving Around Goal Status [phone]): At least 1 percent but less than 20 percent impaired, limited or restricted    Time: 1115-1145 PT Time Calculation (min) (ACUTE ONLY): 30 min   Charges:   PT Evaluation $Initial PT Evaluation Tier I: 1 Procedure PT Treatments $Therapeutic Activity: 8-22 mins   PT G Codes:   PT G-Codes **NOT FOR INPATIENT CLASS** Functional Assessment Tool Used: clinical judgement Functional Limitation: Mobility: Walking and moving around Mobility: Walking and Moving Around Current Status ([id number]): At least 20 percent but less than 40 percent impaired, limited or restricted Mobility: Walking and Moving Around Goal Status [phone]): At least 1 percent but less than 20 percent impaired, limited or restricted   Tyler [name] PT, DPT   [name],Tyler 04/10/2015, 4:45 PM

## 2015-04-10 NOTE — Consult Note (Signed)
[facility] Cardiology Consultation Note  Patient ID: Tyler [name], MRN: [id number], DOB/AGE: Aug 04, 1956 59 y.o. Admit date: 04/09/2015   Date of Consult: 04/10/2015 Primary Physician: Mickey Farber, MD Primary Cardiologist: Gwen Pounds  Chief Complaint:  Chief Complaint  Patient presents with  . Abdominal Pain  . Constipation   Reason for Consult: acute on chronic systolic dysfunction congestive heart failure with ascites  HPI: 59 y.o. male with known chronic systolic dysfunction heart failure axes normal atrial fibrillation severe sleep apnea chronic kidney disease stage III diabetes with complications having acute on chronic systolic dysfunction congestive heart failure and severe lower extremity edema progressive over the last several weeks to the point where the patient cannot breathe very well. He does have severe lower extremity edema and ascites which has been getting worse. The patient does have sleep apnea and we have of further evaluated this and he has a sleep study pending. The patient has had difficulty making it to this sleep study. The patient has had chronic kidney disease stage III which also exacerbates above. Additionally diabetes has been relatively well controlled and hypertension has been treated with medication management listed below. The patient has had normal troponin without evidence of myocardial infarction at this time  Past Medical History  Diagnosis Date  . Cataract   . CHF (congestive heart failure)   . COPD (chronic obstructive pulmonary disease)   . Diabetes mellitus without complication   . Sleep apnea   . Oxygen deficiency   . Thyroid disease   . Gout   . Hypothyroidism   . CKD (chronic kidney disease) stage 3, GFR 30-59 ml/min   . Asthma   . Hypertension       Surgical History:  Past Surgical History  Procedure Laterality Date  . Cholecystectomy      2002  . Eye surgery      right cataract removed 3/21  . Vena cava filter placement        Home Meds: Prior to Admission medications   Medication Sig Start Date End Date Taking? Authorizing Provider  colchicine 0.6 MG tablet 0.6 mg 2 (two) times daily. TAKE ONE TABLET BY MOUTH TWICE DAILY AS NEEDED FOR  WRIST  PAIN 12/06/14   Historical Provider, MD  cyanocobalamin (,VITAMIN B-12,) 1000 MCG/ML injection Inject 1,000 mcg into the muscle every 30 (thirty) days.    Historical Provider, MD  gabapentin (NEURONTIN) 100 MG capsule Take 100 mg by mouth 2 (two) times daily.  10/23/14   Historical Provider, MD  insulin NPH Human (HUMULIN N,NOVOLIN N) 100 UNIT/ML injection Inject 80 Units into the skin 2 (two) times daily before a meal. 80 units subcutaneous 30 min prior to breakfast, 80 units subcutaneous 30 min prior to supper. 10/10/14   Historical Provider, MD  insulin regular (NOVOLIN R,HUMULIN R) 100 units/mL injection Inject 10 Units into the skin 2 (two) times daily before a meal. 10 units 30 min prior to meal if BG >120 & eating 07/10/14   Historical Provider, MD  lactulose (CHRONULAC) 10 GM/15ML solution Take 30 g by mouth 2 (two) times daily as needed for mild constipation or moderate constipation.    Historical Provider, MD  levothyroxine (SYNTHROID, LEVOTHROID) 100 MCG tablet Take 100 mcg by mouth daily. Patient takes this medication at 6am. 08/02/14 08/02/15  Historical Provider, MD  metolazone (ZAROXOLYN) 5 MG tablet Take 5 mg by mouth daily. Pt takes Monday, Wednesday, Friday    Historical Provider, MD  nystatin (MYCOSTATIN) powder Apply 1  g topically daily as needed (for rash.).  11/09/14 11/09/15  Historical Provider, MD  PARoxetine (PAXIL) 20 MG tablet Take 20 mg by mouth daily.  08/02/14   Historical Provider, MD  potassium chloride (K-DUR,KLOR-CON) 10 MEQ tablet Take 20 mEq by mouth daily. TAKE TWO TABLETS BY MOUTH ONCE DAILY 11/22/14   Historical Provider, MD  pravastatin (PRAVACHOL) 40 MG tablet Take 40 mg by mouth at bedtime.  08/02/14   Historical Provider, MD  spironolactone  (ALDACTONE) 25 MG tablet Take 25 mg by mouth daily.  11/09/14 11/09/15  Historical Provider, MD  Tiotropium Bromide-Olodaterol 2.5-2.5 MCG/ACT AERS Inhale 1 spray into the lungs daily as needed (for wheezing and shortness of breath.).    Historical Provider, MD  torsemide (DEMADEX) 20 MG tablet Take 20 mg by mouth 2 (two) times daily.  09/18/14 09/18/15  Historical Provider, MD  vitamin B-12 (CYANOCOBALAMIN) 1000 MCG tablet Take 2,000 mcg by mouth daily.    Historical Provider, MD  warfarin (COUMADIN) 10 MG tablet Take 5-10 mg by mouth daily. Pt states 1 tablet orally Monday and Friday.  Take 1/2 tablet (5mg ) orally Tuesday, Wednesday, Thursday, Saturday, and Sunday.    Historical Provider, MD    Inpatient Medications:  . cefTRIAXone (ROCEPHIN) IVPB 1 gram/50 mL D5W  1 g Intravenous Q24H  . cyanocobalamin  1,000 mcg Intramuscular Q30 days  . [START ON 04/11/2015] furosemide  80 mg Intravenous BID  . gabapentin  100 mg Oral BID  . insulin aspart  0-15 Units Subcutaneous TID WC  . insulin aspart  0-5 Units Subcutaneous QHS  . insulin detemir  80 Units Subcutaneous BID AC  . ipratropium-albuterol  3 mL Nebulization QID  . levothyroxine  100 mcg Oral QAC breakfast  . metolazone  5 mg Oral Daily  . PARoxetine  20 mg Oral Daily  . polyethylene glycol  17 g Oral Daily  . potassium chloride  20 mEq Oral BID  . pravastatin  40 mg Oral QHS  . sodium chloride  3 mL Intravenous Q12H  . spironolactone  25 mg Oral Daily  . vitamin B-12  2,000 mcg Oral Daily  . [START ON 04/12/2015] warfarin  10 mg Oral Once per day on Mon Fri  . warfarin  5 mg Oral Once per day on Sun Tue Wed Thu Sat      Allergies:  Allergies  Allergen Reactions  . Enalapril Cough  . Lovastatin Other (See Comments)    tachycardia  . Atorvastatin Palpitations    History   Social History  . Marital Status: Married    Spouse Name: N/A  . Number of Children: N/A  . Years of Education: N/A   Occupational History  . Not on  file.   Social History Main Topics  . Smoking status: Current Every Day Smoker -- 2.00 packs/day for 30 years    Types: Cigarettes, Cigars  . Smokeless tobacco: Never Used     Comment: cigar 3-4 yrs 1-3 per day/former Cigarettes  . Alcohol Use: No  . Drug Use: No  . Sexual Activity: Not on file   Other Topics Concern  . Not on file   Social History Narrative     Family History  Problem Relation Age of Onset  . Diabetes Sister   . Diabetes Brother      Review of Systems Positive for abdominal discomfort shortness of breath and lower extremity edema Negative for: General:  chills, fever, night sweats or weight changes.  Cardiovascular: PND orthopnea syncope  dizziness  Dermatological skin lesions rashes Respiratory: Cough congestion Urologic: Frequent urination urination at night and hematuria Abdominal: negative for nausea, vomiting, diarrhea, bright red blood per rectum, melena, or hematemesis Neurologic: negative for visual changes, and/or hearing changes  All other systems reviewed and are otherwise negative except as noted above.  Labs:  Recent Labs  04/10/15 0120 04/10/15 0700  TROPONINI 0.03 <0.03   Lab Results  Component Value Date   WBC 4.5 04/10/2015   HGB 10.7* 04/10/2015   HCT 32.8* 04/10/2015   MCV 81.8 04/10/2015   PLT 210 04/10/2015    Recent Labs Lab 04/09/15 1906 04/10/15 0700  NA 130* 130*  K 3.4* 3.3*  CL 88* 88*  CO2 29 31  BUN 28* 28*  CREATININE 1.36* 1.42*  CALCIUM 8.7* 8.4*  PROT 6.9  --   BILITOT 0.9  --   ALKPHOS 228*  --   ALT 9*  --   AST 22  --   GLUCOSE 190* 157*   No results found for: CHOL, HDL, LDLCALC, TRIG No results found for: DDIMER  Radiology/Studies:  Ct Chest Wo Contrast  04/05/2015   CLINICAL DATA:  Chronic respiratory failure  EXAM: CT CHEST WITHOUT CONTRAST  TECHNIQUE: Multidetector CT imaging of the chest was performed following the standard protocol without IV contrast.  COMPARISON:  None  FINDINGS:  Mediastinum: The heart size is mildly enlarged. There is a small to moderate pericardial effusion noted. This is similar to 05/30/2014. Aortic atherosclerosis identified. The trachea appears patent and is midline. Normal appearance of the esophagus. Similar appearance of prominent mediastinal lymph nodes. The largest is in the high right paratracheal region measuring 11 mm, image 11/series 2.  Lungs/Pleura: There are small bilateral pleural effusions. Atelectasis is noted in the left lung day stress set atelectasis is identified within the superior segment of both lower lobes. No lobar consolidation. Chronic mosaic attenuation pattern is identified in both lungs, with patchy areas of ground-glass attenuation in both lungs. The appearance is similar to previous exam. There is no airspace consolidation identified.  Upper Abdomen: No focal liver or splenic abnormality. There is moderate ascites within the upper abdomen. The adrenal glands are unremarkable.  Musculoskeletal: Mild multi level degenerative disc disease identified within the thoracic spine.  IMPRESSION: 1. Mild cardiac enlargement and pericardial effusion is again noted and appears similar to previous exam 2. Aortic atherosclerosis 3. Patchy areas of ground-glass attenuation are again identified in both lungs. This is a finding which is been present dating back to 2012 is likely related to underlying interstitial lung disease such as NSIP. 4. Small bilateral pleural effusions and  left base scarring.   Electronically Signed   By: Signa Kell M.D.   On: 04/05/2015 16:10   Ct Abdomen Pelvis W Contrast  04/09/2015   CLINICAL DATA:  Constipation and abdominal pain, 40 pound weight gain in last 2 months  EXAM: CT ABDOMEN AND PELVIS WITH CONTRAST  TECHNIQUE: Multidetector CT imaging of the abdomen and pelvis was performed using the standard protocol following bolus administration of intravenous contrast.  CONTRAST:  OMNIPAQUE IOHEXOL 350 MG/ML SOLN   COMPARISON:  CT scan 05/09/2014  FINDINGS: Lower chest: Moderate to large pericardial effusion, increased when compared to prior study. Small bilateral pleural effusions new or increased when compared to prior study. Visualized portions of the lung bases clear except for atelectasis left base.  Hepatobiliary: Status post cholecystectomy. No focal hepatic abnormalities.  Pancreas: Normal  Spleen: Normal  Adrenals/Urinary Tract:  Adrenal glands are normal. Bilateral renal atrophy noted. Kidneys otherwise negative.  Stomach/Bowel: Negative  Vascular/Lymphatic: Inferior vena cava filter noted.  Reproductive: Appear to be absent  Other: Study limited by body habitus. Diffuse increased subcutaneous soft tissue attenuation suggesting anasarca. Moderate volume of ascites.  Musculoskeletal: Mild compression deformity of T11 appears mildly progressive when compared to prior study. No evidence of retropulsion.  IMPRESSION: Pericardial and pleural effusions with anasarca and ascites.  Mild interval progression of a mild T11 compression deformity.   Electronically Signed   By: Esperanza Heiraymond  Rubner M.D.   On: 04/09/2015 22:03   Dg Chest Portable 1 View  04/09/2015   CLINICAL DATA:  Chronic cough, with 40 lb weight gain. Initial encounter.  EXAM: PORTABLE CHEST - 1 VIEW  COMPARISON:  CT of the chest performed 04/05/2015, and chest radiograph performed 02/16/2015  FINDINGS: The lungs are well-aerated. A small left pleural effusion is noted. Vascular congestion is noted, with increased interstitial markings, concerning for mild pulmonary edema. There is no evidence of pneumothorax.  The cardiomediastinal silhouette is mildly enlarged. No acute osseous abnormalities are seen.  IMPRESSION: Small left pleural effusion noted. Vascular congestion and mild cardiomegaly, with increased interstitial markings, concerning for mild pulmonary edema.   Electronically Signed   By: Roanna RaiderJeffery  Chang M.D.   On: 04/09/2015 23:28    EKG: Atrial  fibrillation with controlled ventricular rate and nonspecific ST and T-wave changes  Weights: Filed Weights   04/09/15 1858 04/10/15 0054 04/10/15 0500  Weight: 372 lb (168.738 kg) 355 lb 9.6 oz (161.299 kg) 355 lb 3.2 oz (161.118 kg)     Physical Exam: Blood pressure 122/67, pulse 68, temperature 98.3 F (36.8 C), temperature source Oral, resp. rate 20, height 6' (1.829 m), weight 355 lb 3.2 oz (161.118 kg), SpO2 96 %. Body mass index is 48.16 kg/(m^2). General: Well developed, well nourished, in no acute distress. Head eyes ears nose throat: Normocephalic, atraumatic, sclera non-icteric, no xanthomas, nares are without discharge. No apparent thyromegaly and/or mass  Lungs: Normal respiratory effort.  no wheezes, no rales, no rhonchi.  Heart: Irregular with normal S1 S2. no murmur gallop, no rub, PMI is diffuse carotid upstroke normal without bruit, jugular venous pressure is normal Abdomen: Soft, non-tender,  distended with normoactive bowel sounds. Positive hepatomegaly. No rebound/guarding. No obvious abdominal masses. Abdominal aorta is not heard Extremities: Plus edema. Positive cyanosis, no clubbing, positive ulcers  Peripheral : 2+ bilateral upper extremity pulses, 0 + bilateral femoral pulses, 0 + bilateral dorsal pedal pulse Neuro: Alert and oriented. No facial asymmetry. No focal deficit. Moves all extremities spontaneously. Musculoskeletal: Normal muscle tone without kyphosis Psych:  Responds to questions appropriately with a normal affect.    Assessment: 59 year old male with acute on chronic systolic dysfunction congestive heart failure paroxysmal nonvalvular atrial fibrillation severe sleep apnea diabetes with complication chronic kidney disease stage III having significant anemia and further treatment options  Plan: 1. Continue intravenous Lasix for further lower extremity edema and ascites treatment 2. Possible consideration of paracentesis for ascites 3. Beta blocker  for congestive heart failure watching closely for the possibility of bradycardia and sick sinus syndrome 4. Warfarin for further risk reduction in stroke with atrial fibrillation 5. Insulin injection as able for diabetes control 6. Study for further evaluation and treatment of sleep apnea as a primary cause of issues listed above  Signed, Lamar BlinksKOWALSKI,Zhane Donlan J M.D. [facility] 04/10/2015, 8:11 AM

## 2015-04-11 ENCOUNTER — Inpatient Hospital Stay: Payer: Commercial Managed Care - HMO

## 2015-04-11 LAB — GLUCOSE, CAPILLARY
GLUCOSE-CAPILLARY: 109 mg/dL — AB (ref 65–99)
GLUCOSE-CAPILLARY: 47 mg/dL — AB (ref 65–99)
GLUCOSE-CAPILLARY: 61 mg/dL — AB (ref 65–99)
GLUCOSE-CAPILLARY: 80 mg/dL (ref 65–99)
Glucose-Capillary: 96 mg/dL (ref 65–99)
Glucose-Capillary: 84 mg/dL (ref 65–99)

## 2015-04-11 LAB — COMPREHENSIVE METABOLIC PANEL
ALBUMIN: 2.7 g/dL — AB (ref 3.5–5.0)
ALT: 9 U/L — AB (ref 17–63)
AST: 22 U/L (ref 15–41)
Alkaline Phosphatase: 211 U/L — ABNORMAL HIGH (ref 38–126)
Anion gap: 9 (ref 5–15)
BILIRUBIN TOTAL: 0.6 mg/dL (ref 0.3–1.2)
BUN: 33 mg/dL — AB (ref 6–20)
CHLORIDE: 90 mmol/L — AB (ref 101–111)
CO2: 30 mmol/L (ref 22–32)
CREATININE: 2.01 mg/dL — AB (ref 0.61–1.24)
Calcium: 8.2 mg/dL — ABNORMAL LOW (ref 8.9–10.3)
GFR calc Af Amer: 40 mL/min — ABNORMAL LOW (ref >60)
GFR calc non Af Amer: 35 mL/min — ABNORMAL LOW (ref >60)
Glucose, Bld: 140 mg/dL — ABNORMAL HIGH (ref 65–99)
Potassium: 3.6 mmol/L (ref 3.5–5.1)
SODIUM: 129 mmol/L — AB (ref 135–145)
TOTAL PROTEIN: 6.4 g/dL — AB (ref 6.5–8.1)

## 2015-04-11 LAB — CBC WITH DIFFERENTIAL/PLATELET
Basophils Absolute: 0.1 10*3/uL (ref 0–0.1)
Basophils Relative: 1 %
EOS ABS: 0.1 10*3/uL (ref 0–0.7)
EOS PCT: 2 %
HCT: 33.7 % — ABNORMAL LOW (ref 40.0–52.0)
HEMOGLOBIN: 11.2 g/dL — AB (ref 13.0–18.0)
Lymphocytes Relative: 15 %
Lymphs Abs: 1 10*3/uL (ref 1.0–3.6)
MCH: 27.4 pg (ref 26.0–34.0)
MCHC: 33.3 g/dL (ref 32.0–36.0)
MCV: 82.3 fL (ref 80.0–100.0)
MONO ABS: 0.8 10*3/uL (ref 0.2–1.0)
Monocytes Relative: 12 %
NEUTROS ABS: 4.6 10*3/uL (ref 1.4–6.5)
Neutrophils Relative %: 70 %
Platelets: 226 10*3/uL (ref 150–440)
RBC: 4.1 MIL/uL — ABNORMAL LOW (ref 4.40–5.90)
RDW: 16.6 % — ABNORMAL HIGH (ref 11.5–14.5)
WBC: 6.5 10*3/uL (ref 3.8–10.6)

## 2015-04-11 LAB — PROTIME-INR
INR: 2.47
Prothrombin Time: 26.9 seconds — ABNORMAL HIGH (ref 11.4–15.0)

## 2015-04-11 MED ORDER — TAMSULOSIN HCL 0.4 MG PO CAPS
0.4000 mg | ORAL_CAPSULE | Freq: Every day | ORAL | Status: DC
  Administered 2015-04-11 – 2015-04-18 (×8): 0.4 mg via ORAL
  Filled 2015-04-11 (×8): qty 1

## 2015-04-11 MED ORDER — FUROSEMIDE 10 MG/ML IJ SOLN
8.0000 mg/h | INTRAVENOUS | Status: DC
  Administered 2015-04-11: 8 mg/h via INTRAVENOUS
  Filled 2015-04-11: qty 25

## 2015-04-11 MED ORDER — ALBUMIN HUMAN 25 % IV SOLN
25.0000 g | Freq: Two times a day (BID) | INTRAVENOUS | Status: DC
  Administered 2015-04-11 – 2015-04-12 (×3): 25 g via INTRAVENOUS
  Filled 2015-04-11 (×5): qty 100

## 2015-04-11 MED ORDER — SODIUM CHLORIDE 0.9 % IV SOLN
INTRAVENOUS | Status: DC
  Administered 2015-04-11 – 2015-04-13 (×2): via INTRAVENOUS

## 2015-04-11 NOTE — Progress Notes (Signed)
[facility]  ROUNDING NOTE   Subjective:  Pt well known to [place]. Presents now with increasing edema and shortness of breath. Was on IV intermittent lasix, now transitioned to lasix gtt.  Currently on O2.   Albumin noted to be low.   He has suspected sleep apnea with pulmonary hypertension which may be contributing and salt and water retention. Weight not significantly changed during admission. Renal function worsening with diuresis. Maybe preload dependent.   Objective:  Vital signs in last 24 hours:  Temp:  [97.7 F (36.5 C)-98.3 F (36.8 C)] 97.7 F (36.5 C) (07/21 0845) Pulse Rate:  [58-72] 63 (07/21 0845) Resp:  [18-20] 20 (07/21 0845) BP: (113-139)/(60-94) 114/72 mmHg (07/21 0845) SpO2:  [90 %-95 %] 94 % (07/21 0845) Weight:  [160.301 kg (353 lb 6.4 oz)] 160.301 kg (353 lb 6.4 oz) (07/21 0306)  Weight change: -8.437 kg (-18 lb 9.6 oz) Filed Weights   04/10/15 0054 04/10/15 0500 04/11/15 0306  Weight: 161.299 kg (355 lb 9.6 oz) 161.118 kg (355 lb 3.2 oz) 160.301 kg (353 lb 6.4 oz)    Intake/Output: I/O last 3 completed shifts: In: 78 [IV Piggyback:62] Out: 1600 [Urine:1600]   Intake/Output this shift:  Total I/O In: -  Out: 200 [Urine:200]  Physical Exam: General: NAD, obese, sitting up.  Head: Normocephalic, atraumatic. Moist oral mucosal membranes  Eyes: Anicteric  Neck: Supple, trachea midline  Lungs:  Basilar rales  Heart: Regular rate and rhythm S1S2 no rubs  Abdomen:  Soft, nontender, obese, BS present  Extremities:  2+ peripheral edema, tight  Neurologic: Nonfocal, moving all four extremities  Skin: No lesions  Access: none    Basic Metabolic Panel:  Recent Labs Lab 04/09/15 1906 04/10/15 0700 04/11/15 0429  NA 130* 130* 129*  K 3.4* 3.3* 3.6  CL 88* 88* 90*  CO2 29 31 30   GLUCOSE 190* 157* 140*  BUN 28* 28* 33*  CREATININE 1.36* 1.42* 2.01*  CALCIUM 8.7* 8.4* 8.2*    Liver Function Tests:  Recent Labs Lab  04/09/15 1906 04/11/15 0429  AST 22 22  ALT 9* 9*  ALKPHOS 228* 211*  BILITOT 0.9 0.6  PROT 6.9 6.4*  ALBUMIN 2.8* 2.7*    Recent Labs Lab 04/09/15 1906  LIPASE 19*   No results for input(s): AMMONIA in the last 168 hours.  CBC:  Recent Labs Lab 04/09/15 1906 04/10/15 0700 04/11/15 0429  WBC 5.9 4.5 6.5  NEUTROABS  --   --  4.6  HGB 11.7* 10.7* 11.2*  HCT 35.4* 32.8* 33.7*  MCV 81.7 81.8 82.3  PLT 231 210 226    Cardiac Enzymes:  Recent Labs Lab 04/10/15 0120 04/10/15 0700 04/10/15 1256  TROPONINI 0.03 <0.03 <0.03    BNP: Invalid input(s): POCBNP  CBG:  Recent Labs Lab 04/10/15 0801 04/10/15 1152 04/10/15 1655 04/10/15 2017 04/11/15 0732  GLUCAP 160* 157* 234* 228* 96    Microbiology: Results for orders placed or performed in visit on 08/21/14  ED Influenza     Status: None   Collection Time: 08/21/14  4:53 PM  Result Value Ref Range Status   Influenza A By PCR NEGATIVE NEGATIVE Final   Influenza B By PCR NEGATIVE NEGATIVE Final   H1N1 flu by pcr NOT DETECTED NOT-DETECTED Final    Comment:                  ----------------------- The Xpert Flu assay (FDA approval for nasal aspirates or washes and nasopharyngeal swab specimens),  is intended as an aid in the diagnosis of influenza and should not be used as a sole basis for treatment.     Coagulation Studies:  Recent Labs  04/09/15 1906 04/10/15 0700 04/11/15 0429  LABPROT 27.0* 27.8* 26.9*  INR 2.49 2.58 2.47    Urinalysis:  Recent Labs  04/09/15 1943  COLORURINE STRAW*  LABSPEC 1.005  PHURINE 7.0  GLUCOSEU NEGATIVE  HGBUR 1+*  BILIRUBINUR NEGATIVE  KETONESUR NEGATIVE  PROTEINUR 100*  NITRITE NEGATIVE  LEUKOCYTESUR NEGATIVE      Imaging: Dg Chest 2 View  04/11/2015   CLINICAL DATA:  CHF, COPD -asthma, diabetes, morbid obesity.  EXAM: CHEST  2 VIEW  COMPARISON:  Portable chest x-ray of April 09, 2015  FINDINGS: The right lung is adequately inflated. There is no focal  infiltrate. The interstitial markings are coarse and slightly more conspicuous today. On the left there is slight interval improvement in left lower lobe atelectasis or pneumonia. The left hemidiaphragm is faintly visible and the left heart border is slightly better visualized. A pleural effusion may be present. The trachea is midline. The cardiac silhouette where visualized is enlarged. The pulmonary vascularity is engorged.  IMPRESSION: 1. Slight interval improvement in aeration of the left lower lobe may reflect resolving atelectasis or pneumonia. 2. CHF with pulmonary interstitial edema slightly more conspicuous today.   Electronically Signed   By: David  [place] M.D.   On: 04/11/2015 07:41   Ct Abdomen Pelvis W Contrast  04/09/2015   CLINICAL DATA:  Constipation and abdominal pain, 40 pound weight gain in last 2 months  EXAM: CT ABDOMEN AND PELVIS WITH CONTRAST  TECHNIQUE: Multidetector CT imaging of the abdomen and pelvis was performed using the standard protocol following bolus administration of intravenous contrast.  CONTRAST:  OMNIPAQUE IOHEXOL 350 MG/ML SOLN  COMPARISON:  CT scan 05/09/2014  FINDINGS: Lower chest: Moderate to large pericardial effusion, increased when compared to prior study. Small bilateral pleural effusions new or increased when compared to prior study. Visualized portions of the lung bases clear except for atelectasis left base.  Hepatobiliary: Status post cholecystectomy. No focal hepatic abnormalities.  Pancreas: Normal  Spleen: Normal  Adrenals/Urinary Tract: Adrenal glands are normal. Bilateral renal atrophy noted. Kidneys otherwise negative.  Stomach/Bowel: Negative  Vascular/Lymphatic: Inferior vena cava filter noted.  Reproductive: Appear to be absent  Other: Study limited by body habitus. Diffuse increased subcutaneous soft tissue attenuation suggesting anasarca. Moderate volume of ascites.  Musculoskeletal: Mild compression deformity of T11 appears mildly progressive  when compared to prior study. No evidence of retropulsion.  IMPRESSION: Pericardial and pleural effusions with anasarca and ascites.  Mild interval progression of a mild T11 compression deformity.   Electronically Signed   By: Esperanza Heir M.D.   On: 04/09/2015 22:03   US Abdomen Limited  04/10/2015   CLINICAL DATA:  Ascites.  EXAM: LIMITED ABDOMINAL ULTRASOUND  COMPARISON:  CT scan of April 09, 2015.  FINDINGS: Sonographic evaluation demonstrates mild ascites in the right and left lower quadrants. However, due to the depth of these areas and the small fluid pockets present, paracentesis was not attempted.  IMPRESSION: Mild ascites is noted in the right and left lower quadrants.   Electronically Signed   By: Lupita Raider, M.D.   On: 04/10/2015 12:47   Dg Chest Portable 1 View  04/09/2015   CLINICAL DATA:  Chronic cough, with 40 lb weight gain. Initial encounter.  EXAM: PORTABLE CHEST - 1 VIEW  COMPARISON:  CT  of the chest performed 04/05/2015, and chest radiograph performed 02/16/2015  FINDINGS: The lungs are well-aerated. A small left pleural effusion is noted. Vascular congestion is noted, with increased interstitial markings, concerning for mild pulmonary edema. There is no evidence of pneumothorax.  The cardiomediastinal silhouette is mildly enlarged. No acute osseous abnormalities are seen.  IMPRESSION: Small left pleural effusion noted. Vascular congestion and mild cardiomegaly, with increased interstitial markings, concerning for mild pulmonary edema.   Electronically Signed   By: Roanna Raider M.D.   On: 04/09/2015 23:28     Medications:   . furosemide (LASIX) infusion 8 mg/hr (04/11/15 0836)   . cefTRIAXone (ROCEPHIN) IVPB 1 gram/50 mL D5W  1 g Intravenous Q24H  . cyanocobalamin  1,000 mcg Intramuscular Q30 days  . gabapentin  100 mg Oral BID  . insulin aspart  0-15 Units Subcutaneous TID WC  . insulin aspart  0-5 Units Subcutaneous QHS  . insulin detemir  80 Units Subcutaneous BID  AC  . ipratropium-albuterol  3 mL Nebulization QID  . levothyroxine  100 mcg Oral QAC breakfast  . metolazone  5 mg Oral Daily  . PARoxetine  20 mg Oral Daily  . polyethylene glycol  17 g Oral Daily  . potassium chloride  20 mEq Oral BID  . pravastatin  40 mg Oral QHS  . spironolactone  25 mg Oral Daily  . tamsulosin  0.4 mg Oral Daily  . vitamin B-12  2,000 mcg Oral Daily  . [START ON 04/12/2015] warfarin  10 mg Oral Once per day on Mon Fri  . warfarin  5 mg Oral Once per day on Sun Tue Wed Thu Sat   acetaminophen **OR** acetaminophen, lactulose, morphine injection, ondansetron **OR** ondansetron (ZOFRAN) IV, sodium chloride, Tiotropium Bromide-Olodaterol  Assessment/ Plan:  58 y.o. male CKD stage III baseline Cr 1.3, hypertension, morbid obesity, chronic diastolic heart failure, paroxysmal atrial fibrillation, sleep apnea, diabetes mellitus, chronic edema, now presents with increasing edema, shortness of breath, pericardial effusion, pleural effusion, ascites, anasarca.  1.  Acute renal failure/CKD stage III: Exposed to contrast upon admission. The patient will likely pose a difficult to manage condition(s).  He has underlying heart failure with anasarca but has worsening renal function with diuresis.  In addition he has low oncotic pressure state from low albumin.  For now agree with lasix gtt but we will need to closely monitor renal function during this process.  Will add albumin 25g IV q12 hours to medicaiton regimen.    2.  Acute diastolic heart failure:  Dr. Gwen Pounds following, now transitioned to lasix gtt.  Continue to this for now, will add alumin as above. 2D echo pending. Last echo showed normal EF.    3.  Hyponatremia:  Na down to 129, suspect due to volume overloaded state and heart failure.  Attempting diuresis for now.  Will monitor.   LOS: 1 Tyler [name] 7/21/20169:44 AM

## 2015-04-11 NOTE — Care Management (Signed)
Spoke with Rose from [facility]- she sees patient monthly.  Insurance case manager- Amy has not been able to contact patient because phone number has been disconnect.  Will confirm correct phone numbers

## 2015-04-11 NOTE — Progress Notes (Signed)
Tyler [name] is a 59 y.o. male  Anasarca   SUBJECTIVE:  Pt with little urine output. Renal fxn worse. Cardiology consult noted. Paracentesis unable to be performed per Radiology. Pt c/o SOB/DOE and lower abdominal pain. No CP. Also c/o pain from Foley catheter. ______________________________________________________________________  ROS: Review of systems is unremarkable for any active cardiac,respiratory, GI, GU, hematologic, neurologic or psychiatric systems, 10 systems reviewed.  @  Past Medical History  Diagnosis Date  . Cataract   . CHF (congestive heart failure)   . COPD (chronic obstructive pulmonary disease)   . Diabetes mellitus without complication   . Sleep apnea   . Oxygen deficiency   . Thyroid disease   . Gout   . Hypothyroidism   . CKD (chronic kidney disease) stage 3, GFR 30-59 ml/min   . Asthma   . Hypertension     Past Surgical History  Procedure Laterality Date  . Cholecystectomy      2002  . Eye surgery      right cataract removed 3/21  . Vena cava filter placement      PHYSICAL EXAM:  BP 115/66 mmHg  Pulse 58  Temp(Src) 98.2 F (36.8 C) (Oral)  Resp 18  Ht 6' (1.829 m)  Wt 160.301 kg (353 lb 6.4 oz)  BMI 47.92 kg/m2  SpO2 95%  Wt Readings from Last 3 Encounters:  04/11/15 160.301 kg (353 lb 6.4 oz)  03/26/15 161.934 kg (357 lb)  03/12/15 152.862 kg (337 lb)            Constitutional: NAD Neck: supple, no thyromegaly Respiratory: bilateral rales with no wheezes or rhonchi Cardiovascular: RRR, no murmur, no gallop Abdomen:  good BS, tender and distended with scrotal edema noted Extremities: 3+ edema Neuro: alert and oriented, no focal motor or sensory deficits  ASSESSMENT/PLAN:  Labs and imaging studies were reviewed  Will try a Lasix drip. Await echo results and f/u from Cardiology. Will order renal US and consult Nephrology. Repeat labs in AM.

## 2015-04-11 NOTE — Progress Notes (Signed)
Called dr sparks because pt was demanding to take the foley out    Dr Judithann Sheen told me to tell him that it could be taken out but to know that if he does not void we will have to put it back it and the pt voiced understanding.  Foley emptied of of dark yellow urine

## 2015-04-11 NOTE — Progress Notes (Signed)
Physical Therapy Treatment Patient Details Name: Tyler [name] MRN: [id number] DOB: 1956/09/01 Today's Date: 04/11/2015    History of Present Illness Tyler [name] is a 59 y.o. male with a known history of COPD, chronic respiratory failure 2.5 L nasal Baseline, type 2 diabetes mellitus requiring, moderate pulmonary hypertension presenting with abdominal pain.    PT Comments    Pt demonstrates good progress with therapy. He is able to significantly increase his ambulation distance today but requires one seated rest break due to increased low back and bilateral LE pain. He is able to complete seated bed exercises but overall he is fairly deconditioned which is likely a chronic issue secondary to sedentary lifesytle. Pt will benefit from skilled PT services to address deficits in strength, balance, and mobility in order to return to full function at home.    Follow Up Recommendations  Home health PT (Unsure if patient will agree)     Equipment Recommendations  Other (comment) (Wheel kit for rolling walker)    Recommendations for Other Services       Precautions / Restrictions Precautions Precautions: None Restrictions Weight Bearing Restrictions: No    Mobility  Bed Mobility Overal bed mobility: Modified Independent             General bed mobility comments: Use of bed rails but adequate speed and safety noted  Transfers Overall transfer level: Modified independent Equipment used: Rolling walker (2 wheeled) Transfers: Sit to/from Stand Sit to Stand: Modified independent (Device/Increase time)         General transfer comment: Good speed, stability, and strength noted  Ambulation/Gait Ambulation/Gait assistance: Supervision Ambulation Distance (Feet): 220 Feet Assistive device: Rolling walker (2 wheeled) Gait Pattern/deviations: Step-through pattern   Gait velocity interpretation: <1.8 ft/sec, indicative of risk for recurrent falls General Gait  Details: Decreased step length but overall gait is functional for household ambulation. Vitals monitored and SaO2 remains at or greater than 90% on 3 L/min supplemental O2. Pt reports minimal exertion but requires one seated rest break due to increasing low back and bilateral LE. Pt denies DOE and no increase in RR noted.   Stairs            Wheelchair Mobility    Modified Rankin (Stroke Patients Only)       Balance     Sitting balance-Leahy Scale: Good       Standing balance-Leahy Scale: Good                      Cognition Arousal/Alertness: Lethargic Behavior During Therapy: WFL for tasks assessed/performed Overall Cognitive Status: Within Functional Limits for tasks assessed                      Exercises General Exercises - Lower Extremity Long Arc Quad: Strengthening;Both;10 reps;Seated Heel Slides: Strengthening;Both;10 reps;Seated Hip ABduction/ADduction: Strengthening;Both;10 reps;Seated Hip Flexion/Marching: Strengthening;Both;10 reps;Seated Heel Raises: Strengthening;Both;10 reps;Seated    General Comments        Pertinent Vitals/Pain Pain Assessment: 0-10 (Reports chronic low back and leg pain with ambulation) Pain Location: Low back and bilateral LE Pain Intervention(s): Monitored during session    Home Living                      Prior Function            PT Goals (current goals can now be found in the care plan section) Acute Rehab PT Goals Patient Stated Goal: "I think  I'm doing alright." Pt wants to improve strength and mobility PT Goal Formulation: With patient Time For Goal Achievement: 04/24/15 Potential to Achieve Goals: Good Progress towards PT goals: Progressing toward goals    Frequency  Min 2X/week    PT Plan Current plan remains appropriate    Co-evaluation             End of Session Equipment Utilized During Treatment: Gait belt;Oxygen Activity Tolerance: Patient tolerated treatment  well Patient left: in bed;with family/visitor present (Refuses bed alarm. Upright eating lunch. )     Time: 3545-6256 PT Time Calculation (min) (ACUTE ONLY): 23 min  Charges:  $Therapeutic Exercise: 23-37 mins                    G Codes:      Lyndel Safe Lyzette Reinhardt PT, DPT   Keyundra Fant 04/11/2015, 12:21 PM

## 2015-04-11 NOTE — Care Management (Signed)
Patient is now on a Lasix drip.  Physical therapy is recommending home health physical therapy and patient at present is agreeable to return home with home health nursing and physical therapy.  Found that patient is being followed by [facility] CM- Rose Pierzchala  1 [phone].  Left voicemail message.

## 2015-04-12 DIAGNOSIS — R601 Generalized edema: Secondary | ICD-10-CM

## 2015-04-12 DIAGNOSIS — F331 Major depressive disorder, recurrent, moderate: Secondary | ICD-10-CM | POA: Diagnosis present

## 2015-04-12 LAB — GLUCOSE, CAPILLARY
GLUCOSE-CAPILLARY: 28 mg/dL — AB (ref 65–99)
GLUCOSE-CAPILLARY: 123 mg/dL — AB (ref 65–99)
GLUCOSE-CAPILLARY: 146 mg/dL — AB (ref 65–99)
GLUCOSE-CAPILLARY: 144 mg/dL — AB (ref 65–99)
Glucose-Capillary: 46 mg/dL — ABNORMAL LOW (ref 65–99)
Glucose-Capillary: 61 mg/dL — ABNORMAL LOW (ref 65–99)
Glucose-Capillary: 54 mg/dL — ABNORMAL LOW (ref 65–99)
Glucose-Capillary: 67 mg/dL (ref 65–99)

## 2015-04-12 LAB — COMPREHENSIVE METABOLIC PANEL
ALT: 8 U/L — ABNORMAL LOW (ref 17–63)
AST: 22 U/L (ref 15–41)
Albumin: 3.1 g/dL — ABNORMAL LOW (ref 3.5–5.0)
Alkaline Phosphatase: 180 U/L — ABNORMAL HIGH (ref 38–126)
Anion gap: 12 (ref 5–15)
BUN: 35 mg/dL — ABNORMAL HIGH (ref 6–20)
CO2: 30 mmol/L (ref 22–32)
Calcium: 8.6 mg/dL — ABNORMAL LOW (ref 8.9–10.3)
Chloride: 89 mmol/L — ABNORMAL LOW (ref 101–111)
Creatinine, Ser: 2.81 mg/dL — ABNORMAL HIGH (ref 0.61–1.24)
GFR, EST AFRICAN AMERICAN: 27 mL/min — AB (ref >60)
GFR, EST NON AFRICAN AMERICAN: 23 mL/min — AB (ref >60)
Glucose, Bld: 77 mg/dL (ref 65–99)
Potassium: 3.9 mmol/L (ref 3.5–5.1)
Sodium: 131 mmol/L — ABNORMAL LOW (ref 135–145)
Total Bilirubin: 0.6 mg/dL (ref 0.3–1.2)
Total Protein: 6.6 g/dL (ref 6.5–8.1)

## 2015-04-12 LAB — CBC WITH DIFFERENTIAL/PLATELET
BASOS PCT: 1 %
Basophils Absolute: 0.1 10*3/uL (ref 0–0.1)
Eosinophils Absolute: 0.1 10*3/uL (ref 0–0.7)
Eosinophils Relative: 2 %
HCT: 31.6 % — ABNORMAL LOW (ref 40.0–52.0)
HEMOGLOBIN: 10.2 g/dL — AB (ref 13.0–18.0)
Lymphocytes Relative: 11 %
Lymphs Abs: 0.7 10*3/uL — ABNORMAL LOW (ref 1.0–3.6)
MCH: 26.8 pg (ref 26.0–34.0)
MCHC: 32.4 g/dL (ref 32.0–36.0)
MCV: 82.5 fL (ref 80.0–100.0)
MONO ABS: 0.5 10*3/uL (ref 0.2–1.0)
MONOS PCT: 9 %
Neutro Abs: 4.8 10*3/uL (ref 1.4–6.5)
Neutrophils Relative %: 77 %
PLATELETS: 197 10*3/uL (ref 150–440)
RBC: 3.83 MIL/uL — AB (ref 4.40–5.90)
RDW: 16.7 % — ABNORMAL HIGH (ref 11.5–14.5)
WBC: 6.2 10*3/uL (ref 3.8–10.6)

## 2015-04-12 LAB — PROTIME-INR
INR: 2.44
Prothrombin Time: 26.6 seconds — ABNORMAL HIGH (ref 11.4–15.0)

## 2015-04-12 MED ORDER — TIOTROPIUM BROMIDE MONOHYDRATE 18 MCG IN CAPS
18.0000 ug | ORAL_CAPSULE | Freq: Every day | RESPIRATORY_TRACT | Status: DC
  Administered 2015-04-12 – 2015-04-18 (×7): 18 ug via RESPIRATORY_TRACT
  Filled 2015-04-12 (×2): qty 5

## 2015-04-12 MED ORDER — DEXTROSE 50 % IV SOLN
12.5000 g | Freq: Once | INTRAVENOUS | Status: AC
  Administered 2015-04-12: 12.5 g via INTRAVENOUS
  Filled 2015-04-12: qty 50

## 2015-04-12 MED ORDER — MIRTAZAPINE 15 MG PO TABS
15.0000 mg | ORAL_TABLET | Freq: Every day | ORAL | Status: DC
  Administered 2015-04-12: 15 mg via ORAL
  Filled 2015-04-12: qty 1

## 2015-04-12 MED ORDER — SALMETEROL XINAFOATE 50 MCG/DOSE IN AEPB
1.0000 | INHALATION_SPRAY | Freq: Two times a day (BID) | RESPIRATORY_TRACT | Status: DC
  Administered 2015-04-12 – 2015-04-18 (×13): 1 via RESPIRATORY_TRACT
  Filled 2015-04-12: qty 0

## 2015-04-12 NOTE — Progress Notes (Signed)
Inpatient Diabetes Program Recommendations  AACE/ADA: New Consensus Statement on Inpatient Glycemic Control (2013)  Target Ranges:  Prepandial:   less than 140 mg/dL      Peak postprandial:   less than 180 mg/dL (1-2 hours)      Critically ill patients:  140 - 180 mg/dL   Reason for assessment: hypoglycemia  Diabetes history: Type 2 Outpatient Diabetes medications: NPH 80 units bid, Regular insulin 10 units bid before meals Current orders for Inpatient glycemic control:Novolog insulin 0-15 tid with meals, 0-5 units qhs  Please consider decreasing Novolog correction to 0-9 units tid.    Consider starting patient on basal insulin tomorrow at a low dose- consider NPH 10 units bid   Susette Racer, RN, [place], [place], CDE Diabetes Coordinator Inpatient Diabetes Program  [phone] (Team Pager) [phone] [facility] Office) 04/12/2015 2:45 PM

## 2015-04-12 NOTE — Progress Notes (Signed)
Tyler [name] is a 59 y.o. male  Anasarca   SUBJECTIVE:  Pt in NAD. Denies CP. Remains on O2 and denies SOB. Nephrology consult noted. Echo still pending. Awaiting f/u from Cardiology. Pt having low blood sugars. Also, pt is not diuresing despite Lasix drip. Renal fxn is worsening. INR stable on coumadin.  ______________________________________________________________________  ROS: Review of systems is unremarkable for any active cardiac,respiratory, GI, GU, hematologic, neurologic or psychiatric systems, 10 systems reviewed.  @  Past Medical History  Diagnosis Date  . Cataract   . CHF (congestive heart failure)   . COPD (chronic obstructive pulmonary disease)   . Diabetes mellitus without complication   . Sleep apnea   . Oxygen deficiency   . Thyroid disease   . Gout   . Hypothyroidism   . CKD (chronic kidney disease) stage 3, GFR 30-59 ml/min   . Asthma   . Hypertension     Past Surgical History  Procedure Laterality Date  . Cholecystectomy      2002  . Eye surgery      right cataract removed 3/21  . Vena cava filter placement      PHYSICAL EXAM:  BP 110/50 mmHg  Pulse 71  Temp(Src) 97.8 F (36.6 C) (Oral)  Resp 20  Ht 6' (1.829 m)  Wt 162.025 kg (357 lb 3.2 oz)  BMI 48.43 kg/m2  SpO2 94%  Wt Readings from Last 3 Encounters:  04/12/15 162.025 kg (357 lb 3.2 oz)  03/26/15 161.934 kg (357 lb)  03/12/15 152.862 kg (337 lb)            Constitutional: NAD Neck: supple, no thyromegaly Respiratory: basilar rales, no wheezes or rhonchi Cardiovascular: RRR, no murmur, no gallop Abdomen: soft, good BS, nontender Extremities: 3+ edema Neuro: alert and oriented, no focal motor or sensory deficits  ASSESSMENT/PLAN:  Labs and imaging studies were reviewed  Will continue Lasix drip. Stop BID insulin due to hypoglycemia and use SSI only. Continue coumadin. Repeat labs and CXR in AM. Await f/u from Cardiology and Nephrology regarding other options  for treating his anasarca and CHF. Pt appears depressed. Will consult Psych regarding this.

## 2015-04-12 NOTE — Care Management Important Message (Signed)
Important Message  Patient Details  Name: Tyler [name] MRN: [id number] Date of Birth: November 08, 1955   Medicare Important Message Given:  Yes-second notification given    Olegario Messier A Allmond 04/12/2015, 11:09 AM

## 2015-04-12 NOTE — Progress Notes (Signed)
PT Cancellation Note  Patient Details Name: Tyler [name] MRN: [id number] DOB: Mar 04, 1956   Cancelled Treatment:    Reason Eval/Treat Not Completed: Patient declined, no reason specified.  Will check again Monday.   Ivar Drape 04/12/2015, 1:12 PM   Samul Dada, PT MS Acute Rehab Dept. Number: [phone] [id number] and [facility] [phone]

## 2015-04-12 NOTE — Care Management (Signed)
Lasix and albumin had to be discontinued due to worsening renal function. Has been started on slow IVF.  If renal status does not improve may need acute hemodialysis.  Psych has assessed for depression and had added to the medication regime.  If patient would agree to home health (he has accepted but often changes his mind) he would benefit from an agency if in network that could also provide psych nursing.

## 2015-04-12 NOTE — Consult Note (Signed)
[facility] Consult   Reason for Consult:  Consult for this 59 year old man with recurrent depression related in part to his multiple medical problems Referring Physician:  [place] Patient Identification: Tyler [name] MRN:  [id number] Principal Diagnosis: Anasarca Diagnosis:   Patient Active Problem List   Diagnosis Date Noted  . Depression, major, recurrent, moderate [F33.1] 04/12/2015  . CHF (congestive heart failure) [I50.9] 04/10/2015  . Anasarca [R60.1] 04/09/2015  . COPD, moderate [J44.9] 03/12/2015  . Tobacco abuse [Z72.0] 03/12/2015  . Erroneous encounter - disregard [ERROR EN] 02/21/2015  . Acute CHF [I50.9] 02/16/2015  . Hyponatremia [E87.1] 02/16/2015  . A-fib [I48.91] 02/16/2015  . Chronic respiratory failure [J96.10] 02/16/2015    Total Time spent with patient: 1 hour  Subjective:   Tyler [name] is a 59 y.o. male patient admitted with "yes I'm depressed". Patient was referred for evaluation of depression by the hospitalist based on his appearance.  HPI:  Information from the patient and the chart. Patient says that his mood does feel down. He's been feeling more depressed recently and it's gradually been getting worse. He has multiple severe medical problems which she feels are hopeless. He is feeling more helpless and has thoughts at times about wishing that he were dead. He does not have any intention or plan to harm himself. He does not report any hallucinations or psychotic symptoms. His energy level is very low when he has little interest in anything. This is been present now for months with some of the symptoms going back years. The severity of his medical problems are certainly making it worse. He has been compliant with medication including the current 20 mg of paroxetine that he is taking. Denies that he is abusing drugs or alcohol. Stresses at home are chronic. He has some difficulty getting along with his family but he blames most of them  on himself.  Past psychiatric history: No psychiatric hospitalization no suicide attempts. He has been treated with Paxil 20 mg a day for years based on an observation that he had a problem with irritability and anger. He's had anxiety problems through the years. It sounds like at one point he was prescribed benzodiazepine's but was not continued on them.  Social history: Patient lives at home with his wife and son. Patient not able to work. Spends almost all of his time in the house.  Medical history: Patient has congestive heart failure with bilateral edema, abdominal swelling, COPD and history of atrial fibrillation. He is in the hospital with worsening of his heart failure.  Family history: His brother committed suicide as did his grandfather.  Substance abuse history: He reports that he used to drink a lot when he was in his 72s but gave it up. Never had a problem with any drug abuse denies that he ever abused prescription medicine.  Patient reports that at one time his primary care doctor tried to increase his Paxil dose from 20 mg to 30 mg a day and it made him jittery and agitated and so they went back to the current dose. He is not aware of being on any other antidepressives. HPI Elements:   Quality:  Depressed mood fatigue and anhedonia. Severity:  Severe worsening his medical problems potentially life threatening. Timing:  Getting worse over the last couple weeks gradually. Duration:  Long-standing problem related to his medical illness. Context:  Severe chronic life-threatening medical problems causing severe dysfunction and discomfort.  Past Medical History:  Past Medical History  Diagnosis  Date  . Cataract   . CHF (congestive heart failure)   . COPD (chronic obstructive pulmonary disease)   . Diabetes mellitus without complication   . Sleep apnea   . Oxygen deficiency   . Thyroid disease   . Gout   . Hypothyroidism   . CKD (chronic kidney disease) stage 3, GFR 30-59  ml/min   . Asthma   . Hypertension     Past Surgical History  Procedure Laterality Date  . Cholecystectomy      2002  . Eye surgery      right cataract removed 3/21  . Vena cava filter placement     Family History:  Family History  Problem Relation Age of Onset  . Diabetes Sister   . Diabetes Brother    Social History:  History  Alcohol Use No     History  Drug Use No    History   Social History  . Marital Status: Married    Spouse Name: N/A  . Number of Children: N/A  . Years of Education: N/A   Social History Main Topics  . Smoking status: Current Every Day Smoker -- 2.00 packs/day for 30 years    Types: Cigarettes, Cigars  . Smokeless tobacco: Never Used     Comment: cigar 3-4 yrs 1-3 per day/former Cigarettes  . Alcohol Use: No  . Drug Use: No  . Sexual Activity: Not on file   Other Topics Concern  . None   Social History Narrative   Additional Social History:                          Allergies:   Allergies  Allergen Reactions  . Enalapril Cough  . Lovastatin Other (See Comments)    tachycardia  . Atorvastatin Palpitations    Labs:  Results for orders placed or performed during the hospital encounter of 04/09/15 (from the past 48 hour(s))  Glucose, capillary     Status: Abnormal   Collection Time: 04/10/15  4:55 PM  Result Value Ref Range   Glucose-Capillary 234 (H) 65 - 99 mg/dL  Glucose, capillary     Status: Abnormal   Collection Time: 04/10/15  8:17 PM  Result Value Ref Range   Glucose-Capillary 228 (H) 65 - 99 mg/dL   Comment 1 Notify RN   CBC with Differential/Platelet     Status: Abnormal   Collection Time: 04/11/15  4:29 AM  Result Value Ref Range   WBC 6.5 3.8 - 10.6 K/uL   RBC 4.10 (L) 4.40 - 5.90 MIL/uL   Hemoglobin 11.2 (L) 13.0 - 18.0 g/dL   HCT 33.7 (L) 40.0 - 52.0 %   MCV 82.3 80.0 - 100.0 fL   MCH 27.4 26.0 - 34.0 pg   MCHC 33.3 32.0 - 36.0 g/dL   RDW 16.6 (H) 11.5 - 14.5 %   Platelets 226 150 - 440 K/uL    Neutrophils Relative % 70 %   Neutro Abs 4.6 1.4 - 6.5 K/uL   Lymphocytes Relative 15 %   Lymphs Abs 1.0 1.0 - 3.6 K/uL   Monocytes Relative 12 %   Monocytes Absolute 0.8 0.2 - 1.0 K/uL   Eosinophils Relative 2 %   Eosinophils Absolute 0.1 0 - 0.7 K/uL   Basophils Relative 1 %   Basophils Absolute 0.1 0 - 0.1 K/uL  Comprehensive metabolic panel     Status: Abnormal   Collection Time: 04/11/15  4:29 AM  Result  Value Ref Range   Sodium 129 (L) 135 - 145 mmol/L   Potassium 3.6 3.5 - 5.1 mmol/L   Chloride 90 (L) 101 - 111 mmol/L   CO2 30 22 - 32 mmol/L   Glucose, Bld 140 (H) 65 - 99 mg/dL   BUN 33 (H) 6 - 20 mg/dL   Creatinine, Ser 2.01 (H) 0.61 - 1.24 mg/dL   Calcium 8.2 (L) 8.9 - 10.3 mg/dL   Total Protein 6.4 (L) 6.5 - 8.1 g/dL   Albumin 2.7 (L) 3.5 - 5.0 g/dL   AST 22 15 - 41 U/L   ALT 9 (L) 17 - 63 U/L   Alkaline Phosphatase 211 (H) 38 - 126 U/L   Total Bilirubin 0.6 0.3 - 1.2 mg/dL   GFR calc non Af Amer 35 (L) >60 mL/min   GFR calc Af Amer 40 (L) >60 mL/min    Comment: (NOTE) The eGFR has been calculated using the CKD EPI equation. This calculation has not been validated in all clinical situations. eGFR's persistently <60 mL/min signify possible Chronic Kidney Disease.    Anion gap 9 5 - 15  Protime-INR     Status: Abnormal   Collection Time: 04/11/15  4:29 AM  Result Value Ref Range   Prothrombin Time 26.9 (H) 11.4 - 15.0 seconds   INR 2.47   Glucose, capillary     Status: None   Collection Time: 04/11/15  7:32 AM  Result Value Ref Range   Glucose-Capillary 96 65 - 99 mg/dL   Comment 1 Notify RN   Glucose, capillary     Status: Abnormal   Collection Time: 04/11/15 11:19 AM  Result Value Ref Range   Glucose-Capillary 109 (H) 65 - 99 mg/dL   Comment 1 Notify RN   Glucose, capillary     Status: None   Collection Time: 04/11/15  5:28 PM  Result Value Ref Range   Glucose-Capillary 84 65 - 99 mg/dL   Comment 1 Notify RN   Glucose, capillary     Status: Abnormal     Collection Time: 04/11/15  8:41 PM  Result Value Ref Range   Glucose-Capillary 47 (L) 65 - 99 mg/dL   Comment 1 Notify RN   Glucose, capillary     Status: Abnormal   Collection Time: 04/11/15  8:59 PM  Result Value Ref Range   Glucose-Capillary 61 (L) 65 - 99 mg/dL   Comment 1 Notify RN   Glucose, capillary     Status: None   Collection Time: 04/11/15 10:27 PM  Result Value Ref Range   Glucose-Capillary 80 65 - 99 mg/dL   Comment 1 Notify RN   Glucose, capillary     Status: Abnormal   Collection Time: 04/12/15  4:07 AM  Result Value Ref Range   Glucose-Capillary 46 (L) 65 - 99 mg/dL  Glucose, capillary     Status: Abnormal   Collection Time: 04/12/15  4:31 AM  Result Value Ref Range   Glucose-Capillary 61 (L) 65 - 99 mg/dL  CBC with Differential/Platelet     Status: Abnormal   Collection Time: 04/12/15  5:04 AM  Result Value Ref Range   WBC 6.2 3.8 - 10.6 K/uL   RBC 3.83 (L) 4.40 - 5.90 MIL/uL   Hemoglobin 10.2 (L) 13.0 - 18.0 g/dL   HCT 31.6 (L) 40.0 - 52.0 %   MCV 82.5 80.0 - 100.0 fL   MCH 26.8 26.0 - 34.0 pg   MCHC 32.4 32.0 - 36.0 g/dL  RDW 16.7 (H) 11.5 - 14.5 %   Platelets 197 150 - 440 K/uL   Neutrophils Relative % 77 %   Neutro Abs 4.8 1.4 - 6.5 K/uL   Lymphocytes Relative 11 %   Lymphs Abs 0.7 (L) 1.0 - 3.6 K/uL   Monocytes Relative 9 %   Monocytes Absolute 0.5 0.2 - 1.0 K/uL   Eosinophils Relative 2 %   Eosinophils Absolute 0.1 0 - 0.7 K/uL   Basophils Relative 1 %   Basophils Absolute 0.1 0 - 0.1 K/uL  Comprehensive metabolic panel     Status: Abnormal   Collection Time: 04/12/15  5:04 AM  Result Value Ref Range   Sodium 131 (L) 135 - 145 mmol/L   Potassium 3.9 3.5 - 5.1 mmol/L   Chloride 89 (L) 101 - 111 mmol/L   CO2 30 22 - 32 mmol/L   Glucose, Bld 77 65 - 99 mg/dL   BUN 35 (H) 6 - 20 mg/dL   Creatinine, Ser 2.81 (H) 0.61 - 1.24 mg/dL   Calcium 8.6 (L) 8.9 - 10.3 mg/dL   Total Protein 6.6 6.5 - 8.1 g/dL   Albumin 3.1 (L) 3.5 - 5.0 g/dL   AST  22 15 - 41 U/L   ALT 8 (L) 17 - 63 U/L   Alkaline Phosphatase 180 (H) 38 - 126 U/L   Total Bilirubin 0.6 0.3 - 1.2 mg/dL   GFR calc non Af Amer 23 (L) >60 mL/min   GFR calc Af Amer 27 (L) >60 mL/min    Comment: (NOTE) The eGFR has been calculated using the CKD EPI equation. This calculation has not been validated in all clinical situations. eGFR's persistently <60 mL/min signify possible Chronic Kidney Disease.    Anion gap 12 5 - 15  Protime-INR     Status: Abnormal   Collection Time: 04/12/15  5:04 AM  Result Value Ref Range   Prothrombin Time 26.6 (H) 11.4 - 15.0 seconds   INR 2.44   Glucose, capillary     Status: Abnormal   Collection Time: 04/12/15  7:26 AM  Result Value Ref Range   Glucose-Capillary 54 (L) 65 - 99 mg/dL  Glucose, capillary     Status: Abnormal   Collection Time: 04/12/15  7:37 AM  Result Value Ref Range   Glucose-Capillary 28 (LL) 65 - 99 mg/dL   Comment 1 Call MD NNP PA CNM   Glucose, capillary     Status: None   Collection Time: 04/12/15  8:02 AM  Result Value Ref Range   Glucose-Capillary 67 65 - 99 mg/dL  Glucose, capillary     Status: Abnormal   Collection Time: 04/12/15 11:04 AM  Result Value Ref Range   Glucose-Capillary 123 (H) 65 - 99 mg/dL    Vitals: Blood pressure 103/65, pulse 61, temperature 97.5 F (36.4 C), temperature source Oral, resp. rate 20, height 6' (1.829 m), weight 162.025 kg (357 lb 3.2 oz), SpO2 100 %.  Risk to Self: Is patient at risk for suicide?: No Risk to Others:   Prior Inpatient Therapy:   Prior Outpatient Therapy:    Current Facility-Administered Medications  Medication Dose Route Frequency Provider Last Rate Last Dose  . 0.9 %  sodium chloride infusion   Intravenous Continuous Idelle Crouch, MD 50 mL/hr at 04/12/15 0910    . acetaminophen (TYLENOL) tablet 650 mg  650 mg Oral Q6H PRN Lytle Butte, MD   650 mg at 04/11/15 2300   Or  . acetaminophen (  TYLENOL) suppository 650 mg  650 mg Rectal Q6H PRN Lytle Butte, MD      . cefTRIAXone (ROCEPHIN) 1 g in dextrose 5 % 50 mL IVPB  1 g Intravenous Q24H Idelle Crouch, MD   1 g at 04/12/15 1004  . cyanocobalamin ((VITAMIN B-12)) injection 1,000 mcg  1,000 mcg Intramuscular Q30 days Lytle Butte, MD   1,000 mcg at 04/10/15 0105  . gabapentin (NEURONTIN) capsule 100 mg  100 mg Oral BID Lytle Butte, MD   100 mg at 04/12/15 1005  . insulin aspart (novoLOG) injection 0-15 Units  0-15 Units Subcutaneous TID WC Lytle Butte, MD   5 Units at 04/10/15 1743  . insulin aspart (novoLOG) injection 0-5 Units  0-5 Units Subcutaneous QHS Lytle Butte, MD   2 Units at 04/10/15 2115  . ipratropium-albuterol (DUONEB) 0.5-2.5 (3) MG/3ML nebulizer solution 3 mL  3 mL Nebulization QID Idelle Crouch, MD   3 mL at 04/12/15 1506  . lactulose (CHRONULAC) 10 GM/15ML solution 30 g  30 g Oral BID PRN Lytle Butte, MD      . levothyroxine (SYNTHROID, LEVOTHROID) tablet 100 mcg  100 mcg Oral QAC breakfast Lytle Butte, MD   100 mcg at 04/12/15 1005  . mirtazapine (REMERON) tablet 15 mg  15 mg Oral QHS Gonzella Lex, MD      . morphine 2 MG/ML injection 2 mg  2 mg Intravenous Q4H PRN Lytle Butte, MD   2 mg at 04/12/15 1053  . ondansetron (ZOFRAN) tablet 4 mg  4 mg Oral Q6H PRN Lytle Butte, MD       Or  . ondansetron [facility]) injection 4 mg  4 mg Intravenous Q6H PRN Lytle Butte, MD      . PARoxetine (PAXIL) tablet 20 mg  20 mg Oral Daily Lytle Butte, MD   20 mg at 04/12/15 1005  . polyethylene glycol (MIRALAX / GLYCOLAX) packet 17 g  17 g Oral Daily Lytle Butte, MD   17 g at 04/12/15 1004  . potassium chloride (KLOR-CON) packet 20 mEq  20 mEq Oral BID Idelle Crouch, MD   20 mEq at 04/12/15 1004  . pravastatin (PRAVACHOL) tablet 40 mg  40 mg Oral QHS Lytle Butte, MD   40 mg at 04/11/15 2300  . salmeterol (SEREVENT) diskus inhaler 1 puff  1 puff Inhalation Q12H Idelle Crouch, MD      . sodium chloride 0.9 % injection 3 mL  3 mL Intravenous PRN Idelle Crouch, MD      . spironolactone (ALDACTONE) tablet 25 mg  25 mg Oral Daily Lytle Butte, MD   25 mg at 04/12/15 1005  . tamsulosin (FLOMAX) capsule 0.4 mg  0.4 mg Oral Daily Idelle Crouch, MD   0.4 mg at 04/12/15 1005  . tiotropium (SPIRIVA) inhalation capsule 18 mcg  18 mcg Inhalation Daily Idelle Crouch, MD      . vitamin B-12 (CYANOCOBALAMIN) tablet 2,000 mcg  2,000 mcg Oral Daily Lytle Butte, MD   2,000 mcg at 04/12/15 1005  . warfarin (COUMADIN) tablet 10 mg  10 mg Oral Once per day on Mon Fri Lytle Butte, MD      . warfarin (COUMADIN) tablet 5 mg  5 mg Oral Once per day on Sun Tue Wed Thu Sat Idelle Crouch, MD   5 mg at 04/11/15 1732  Musculoskeletal: Strength & Muscle Tone: within normal limits Gait & Station: unsteady Patient leans: N/A  Psychiatric Specialty Exam: Physical Exam  Constitutional: He appears well-developed and well-nourished. He appears lethargic. He is cooperative. He has a sickly appearance.  HENT:  Head: Normocephalic and atraumatic.  Eyes: Conjunctivae are normal. Pupils are equal, round, and reactive to light.  Neck: Normal range of motion.  Cardiovascular: Normal heart sounds.   Respiratory: Effort normal.  GI: Soft. He exhibits distension and ascites.  Musculoskeletal: Normal range of motion.       Right shoulder: He exhibits swelling.       Legs: Neurological: He appears lethargic.  Skin: Skin is warm and dry.  Psychiatric: Judgment normal. His speech is delayed. He is slowed. Cognition and memory are normal. He exhibits a depressed mood. He expresses suicidal ideation.  Patient presents as a chronically and acutely ill man who was cooperative with the interview. Eye contact intermittent. Psychomotor activity very sluggish. Speech is slow and decreased in amount that he was forthcoming and cooperative. Affect dysphoric. Patient describes himself as being depressed. Passive thoughts of death but no intention to kill himself. No psychotic  symptoms. Judgment and insight appear to be fairly adequate. Cognition appears intact.    Review of Systems  Constitutional: Positive for malaise/fatigue.  HENT: Negative.   Eyes: Negative.   Respiratory: Negative.   Cardiovascular: Positive for orthopnea and leg swelling.  Gastrointestinal: Positive for abdominal pain.  Genitourinary: Positive for dysuria.  Musculoskeletal: Negative.   Skin: Negative.   Neurological: Positive for weakness.  Psychiatric/Behavioral: Positive for depression and suicidal ideas. Negative for hallucinations, memory loss and substance abuse. The patient is nervous/anxious and has insomnia.     Blood pressure 103/65, pulse 61, temperature 97.5 F (36.4 C), temperature source Oral, resp. rate 20, height 6' (1.829 m), weight 162.025 kg (357 lb 3.2 oz), SpO2 100 %.Body mass index is 48.43 kg/(m^2).  General Appearance: Disheveled  Eye Sport and exercise psychologist::  Fair  Speech:  Slow  Volume:  Decreased  Mood:  Depressed  Affect:  Depressed  Thought Process:  Goal Directed  Orientation:  Full (Time, Place, and Person)  Thought Content:  Negative  Suicidal Thoughts:  Yes.  without intent/plan  Homicidal Thoughts:  No  Memory:  Immediate;   Good Recent;   Fair Remote;   Fair  Judgement:  Intact  Insight:  Fair  Psychomotor Activity:  Decreased  Concentration:  Fair  Recall:  [employer] [facility] [place]  Language: Fair  Akathisia:  No  Handed:  Right  AIMS (if indicated):     Assets:  Communication Skills Desire for Improvement Housing Intimacy Social Support  ADL's:  Intact  Cognition: WNL  Sleep:      Medical Decision Making: Review of Psycho-Social Stressors (1), Review or order clinical lab tests (1), Review and summation of old records (2), Established Problem, Worsening (2), Review or order medicine tests (1), Review of Medication Regimen & Side Effects (2) and Review of New Medication or Change in Dosage (2)  Treatment Plan Summary: Medication management  and Plan Patient with severe medical problems also endorsing multiple symptoms of major depression. Passive suicidal thoughts without intent or plan. No sign of acute psychosis. Has had some benefit from his current proximal a teen but has been unable to tolerate higher doses. Supportive counseling done. Education done about depression. I am going to add 15 mg of mirtazapine to his current medicine at night which may help him to sleep as  well. Continue to follow-up over the weekend and as long as he is in the hospital. Patient agreeable to the plan. Diagnosis major depression moderate severe recurrent  Plan:  Supportive therapy provided about ongoing stressors. Discussed crisis plan, support from social network, calling 911, coming to the Emergency Department, and calling Suicide Hotline. Medication management Disposition: Continue monitoring in the hospital  [place] 04/12/2015 3:49 PM

## 2015-04-12 NOTE — Progress Notes (Signed)
[facility]  ROUNDING NOTE   Subjective:  Pt seen at bedside. Renal function worse.  Was started on IVFs.   Has been gaining weight despite lasix.   Objective:  Vital signs in last 24 hours:  Temp:  [97.5 F (36.4 C)-97.8 F (36.6 C)] 97.5 F (36.4 C) (07/22 1102) Pulse Rate:  [61-74] 61 (07/22 1102) Resp:  [18-20] 20 (07/22 1102) BP: (103-113)/(50-65) 103/65 mmHg (07/22 1102) SpO2:  [94 %-100 %] 100 % (07/22 1102) Weight:  [162.025 kg (357 lb 3.2 oz)] 162.025 kg (357 lb 3.2 oz) (07/22 0353)  Weight change: 1.724 kg (3 lb 12.8 oz) Filed Weights   04/10/15 0500 04/11/15 0306 04/12/15 0353  Weight: 161.118 kg (355 lb 3.2 oz) 160.301 kg (353 lb 6.4 oz) 162.025 kg (357 lb 3.2 oz)    Intake/Output: I/O last 3 completed shifts: In: 360 [P.O.:360] Out: 400 [Urine:400]   Intake/Output this shift:  Total I/O In: 1697 [I.V.:1597; IV Piggyback:100] Out: -   Physical Exam: General: NAD, obese, laying in bed  Head: Normocephalic, atraumatic. Moist oral mucosal membranes  Eyes: Anicteric  Neck: Supple, trachea midline  Lungs:  Basilar rales normal effort  Heart: Regular rate and rhythm S1S2 no rubs  Abdomen:  Soft, nontender, obese, BS present  Extremities:  2+ peripheral edema, tight  Neurologic: Nonfocal, moving all four extremities  Skin: No lesions  Access: none    Basic Metabolic Panel:  Recent Labs Lab 04/09/15 1906 04/10/15 0700 04/11/15 0429 04/12/15 0504  NA 130* 130* 129* 131*  K 3.4* 3.3* 3.6 3.9  CL 88* 88* 90* 89*  CO2 GLUCOSE 190* 157* 140* 77  BUN 28* 28* 33* 35*  CREATININE 1.36* 1.42* 2.01* 2.81*  CALCIUM 8.7* 8.4* 8.2* 8.6*    Liver Function Tests:  Recent Labs Lab 04/09/15 1906 04/11/15 0429 04/12/15 0504  AST ALT 9* 9* 8*  ALKPHOS 228* 211* 180*  BILITOT 0.9 0.6 0.6  PROT 6.9 6.4* 6.6  ALBUMIN 2.8* 2.7* 3.1*    Recent Labs Lab 04/09/15 1906  LIPASE 19*   No results for input(s): AMMONIA  in the last 168 hours.  CBC:  Recent Labs Lab 04/09/15 1906 04/10/15 0700 04/11/15 0429 04/12/15 0504  WBC 5.9 4.5 6.5 6.2  NEUTROABS  --   --  4.6 4.8  HGB 11.7* 10.7* 11.2* 10.2*  HCT 35.4* 32.8* 33.7* 31.6*  MCV 81.7 81.8 82.3 82.5  PLT 231 210 226 197    Cardiac Enzymes:  Recent Labs Lab 04/10/15 0120 04/10/15 0700 04/10/15 1256  TROPONINI 0.03 <0.03 <0.03    BNP: Invalid input(s): POCBNP  CBG:  Recent Labs Lab 04/12/15 0431 04/12/15 0726 04/12/15 0737 04/12/15 0802 04/12/15 1104  GLUCAP 61* 54* 28* 67 123*    Microbiology: Results for orders placed or performed in visit on 08/21/14  ED Influenza     Status: None   Collection Time: 08/21/14  4:53 PM  Result Value Ref Range Status   Influenza A By PCR NEGATIVE NEGATIVE Final   Influenza B By PCR NEGATIVE NEGATIVE Final   H1N1 flu by pcr NOT DETECTED NOT-DETECTED Final    Comment:                  ----------------------- The Xpert Flu assay (FDA approval for nasal aspirates or washes and nasopharyngeal swab specimens), is intended as an aid in the diagnosis of influenza and should not be used as a sole  basis for treatment.     Coagulation Studies:  Recent Labs  04/09/15 1906 04/10/15 0700 04/11/15 0429 04/12/15 0504  LABPROT 27.0* 27.8* 26.9* 26.6*  INR 2.49 2.58 2.47 2.44    Urinalysis:  Recent Labs  04/09/15 1943  COLORURINE STRAW*  LABSPEC 1.005  PHURINE 7.0  GLUCOSEU NEGATIVE  HGBUR 1+*  BILIRUBINUR NEGATIVE  KETONESUR NEGATIVE  PROTEINUR 100*  NITRITE NEGATIVE  LEUKOCYTESUR NEGATIVE      Imaging: Dg Chest 2 View  04/11/2015   CLINICAL DATA:  CHF, COPD -asthma, diabetes, morbid obesity.  EXAM: CHEST  2 VIEW  COMPARISON:  Portable chest x-ray of April 09, 2015  FINDINGS: The right lung is adequately inflated. There is no focal infiltrate. The interstitial markings are coarse and slightly more conspicuous today. On the left there is slight interval improvement in left lower  lobe atelectasis or pneumonia. The left hemidiaphragm is faintly visible and the left heart border is slightly better visualized. A pleural effusion may be present. The trachea is midline. The cardiac silhouette where visualized is enlarged. The pulmonary vascularity is engorged.  IMPRESSION: 1. Slight interval improvement in aeration of the left lower lobe may reflect resolving atelectasis or pneumonia. 2. CHF with pulmonary interstitial edema slightly more conspicuous today.   Electronically Signed   By: David  [place] M.D.   On: 04/11/2015 07:41   US Renal  04/11/2015   CLINICAL DATA:  Acute renal failure.  EXAM: RENAL / URINARY TRACT ULTRASOUND COMPLETE  COMPARISON:  CT 04/09/2015  FINDINGS: Right Kidney:  Length: 16.4 cm. Echogenicity within normal limits. No mass or hydronephrosis visualized.  Left Kidney:  Length: 15.3 cm. Echogenicity within normal limits. No mass or hydronephrosis visualized.  Bladder:  Not visualized  Ascites most pronounced within the right upper and right lower quadrants.  IMPRESSION: No hydronephrosis.  Bladder is not well visualized.  Ascites.   Electronically Signed   By: Annia Belt M.D.   On: 04/11/2015 16:53     Medications:   . sodium chloride 50 mL/hr at 04/12/15 0910   . albumin human  25 g Intravenous Q12H  . cefTRIAXone (ROCEPHIN) IVPB 1 gram/50 mL D5W  1 g Intravenous Q24H  . cyanocobalamin  1,000 mcg Intramuscular Q30 days  . gabapentin  100 mg Oral BID  . insulin aspart  0-15 Units Subcutaneous TID WC  . insulin aspart  0-5 Units Subcutaneous QHS  . ipratropium-albuterol  3 mL Nebulization QID  . levothyroxine  100 mcg Oral QAC breakfast  . PARoxetine  20 mg Oral Daily  . polyethylene glycol  17 g Oral Daily  . potassium chloride  20 mEq Oral BID  . pravastatin  40 mg Oral QHS  . salmeterol  1 puff Inhalation Q12H  . spironolactone  25 mg Oral Daily  . tamsulosin  0.4 mg Oral Daily  . tiotropium  18 mcg Inhalation Daily  . vitamin B-12  2,000 mcg  Oral Daily  . warfarin  10 mg Oral Once per day on Mon Fri  . warfarin  5 mg Oral Once per day on Sun Tue Wed Thu Sat   acetaminophen **OR** acetaminophen, lactulose, morphine injection, ondansetron **OR** ondansetron (ZOFRAN) IV, sodium chloride  Assessment/ Plan:  59 y.o. male CKD stage III baseline Cr 1.3, hypertension, morbid obesity, chronic diastolic heart failure, paroxysmal atrial fibrillation, sleep apnea, diabetes mellitus, chronic edema, now presents with increasing edema, shortness of breath, pericardial effusion, pleural effusion, ascites, anasarca.  1.  Acute renal failure/CKD stage  III: Exposed to contrast upon admission. Has underlying heart failure with anasarca but has worsening renal function with diuresis.  In addition he has low oncotic pressure state from low albumin.   -Pt appears to be quite preload dependent.  He will have a difficult to manage condition.  Will temporarily hold lasix given worsening renal function.  Agree with gentle hydration for now recognizing this may worsen his renal function.  If renal function continues to worsen we may need to consider temporary dialysis particuarly given low UOP. Marland Kitchen  2.  Acute diastolic heart failure:  Dr. Gwen Pounds following, lasix stopped, on IVFs for a limited time to see if we can improve renal function as he's likely preload dependent, will stop albumin as well.   3.  Hyponatremia:  Na up to 131.  On NS now, will continue for trial limited trial given underlying heart failure, edema, and ascites.   LOS: 2 Tysheena Ginzburg 7/22/20162:48 PM

## 2015-04-13 ENCOUNTER — Inpatient Hospital Stay: Payer: Commercial Managed Care - HMO

## 2015-04-13 LAB — CBC WITH DIFFERENTIAL/PLATELET
BASOS ABS: 0 10*3/uL (ref 0–0.1)
BASOS PCT: 1 %
Eosinophils Absolute: 0.2 10*3/uL (ref 0–0.7)
Eosinophils Relative: 3 %
HCT: 30 % — ABNORMAL LOW (ref 40.0–52.0)
Hemoglobin: 9.9 g/dL — ABNORMAL LOW (ref 13.0–18.0)
Lymphocytes Relative: 13 %
Lymphs Abs: 0.7 10*3/uL — ABNORMAL LOW (ref 1.0–3.6)
MCH: 27.3 pg (ref 26.0–34.0)
MCHC: 32.9 g/dL (ref 32.0–36.0)
MCV: 83 fL (ref 80.0–100.0)
MONO ABS: 0.6 10*3/uL (ref 0.2–1.0)
Monocytes Relative: 12 %
NEUTROS ABS: 3.9 10*3/uL (ref 1.4–6.5)
NEUTROS PCT: 71 %
Platelets: 187 10*3/uL (ref 150–440)
RBC: 3.62 MIL/uL — ABNORMAL LOW (ref 4.40–5.90)
RDW: 16.8 % — AB (ref 11.5–14.5)
WBC: 5.4 10*3/uL (ref 3.8–10.6)

## 2015-04-13 LAB — COMPREHENSIVE METABOLIC PANEL
ALT: 8 U/L — ABNORMAL LOW (ref 17–63)
AST: 19 U/L (ref 15–41)
Albumin: 2.8 g/dL — ABNORMAL LOW (ref 3.5–5.0)
Alkaline Phosphatase: 178 U/L — ABNORMAL HIGH (ref 38–126)
Anion gap: 12 (ref 5–15)
BUN: 37 mg/dL — ABNORMAL HIGH (ref 6–20)
CO2: 28 mmol/L (ref 22–32)
Calcium: 8.2 mg/dL — ABNORMAL LOW (ref 8.9–10.3)
Chloride: 90 mmol/L — ABNORMAL LOW (ref 101–111)
Creatinine, Ser: 2.83 mg/dL — ABNORMAL HIGH (ref 0.61–1.24)
GFR calc non Af Amer: 23 mL/min — ABNORMAL LOW (ref >60)
GFR, EST AFRICAN AMERICAN: 27 mL/min — AB (ref >60)
Glucose, Bld: 138 mg/dL — ABNORMAL HIGH (ref 65–99)
POTASSIUM: 4.7 mmol/L (ref 3.5–5.1)
Sodium: 130 mmol/L — ABNORMAL LOW (ref 135–145)
Total Bilirubin: 0.8 mg/dL (ref 0.3–1.2)
Total Protein: 5.9 g/dL — ABNORMAL LOW (ref 6.5–8.1)

## 2015-04-13 LAB — PROTIME-INR
INR: 2.61
PROTHROMBIN TIME: 28 s — AB (ref 11.4–15.0)

## 2015-04-13 LAB — GLUCOSE, CAPILLARY
GLUCOSE-CAPILLARY: 160 mg/dL — AB (ref 65–99)
GLUCOSE-CAPILLARY: 159 mg/dL — AB (ref 65–99)
Glucose-Capillary: 151 mg/dL — ABNORMAL HIGH (ref 65–99)
Glucose-Capillary: 194 mg/dL — ABNORMAL HIGH (ref 65–99)

## 2015-04-13 MED ORDER — BISACODYL 10 MG RE SUPP
10.0000 mg | Freq: Once | RECTAL | Status: AC
  Administered 2015-04-13: 10 mg via RECTAL
  Filled 2015-04-13: qty 1

## 2015-04-13 MED ORDER — MIRTAZAPINE 30 MG PO TABS
30.0000 mg | ORAL_TABLET | Freq: Every day | ORAL | Status: DC
  Administered 2015-04-13 – 2015-04-18 (×6): 30 mg via ORAL
  Filled 2015-04-13 (×6): qty 1

## 2015-04-13 NOTE — Consult Note (Signed)
[facility] Consult   Reason for Consult:  Consult for this 59 year old man with recurrent depression related in part to his multiple medical problems Referring Physician:  [place] Patient Identification: Tyler [name] MRN:  [id number] Principal Diagnosis: Anasarca Diagnosis:   Patient Active Problem List   Diagnosis Date Noted  . Depression, major, recurrent, moderate [F33.1] 04/12/2015  . CHF (congestive heart failure) [I50.9] 04/10/2015  . Anasarca [R60.1] 04/09/2015  . COPD, moderate [J44.9] 03/12/2015  . Tobacco abuse [Z72.0] 03/12/2015  . Acute CHF [I50.9] 02/16/2015  . Hyponatremia [E87.1] 02/16/2015  . A-fib [I48.91] 02/16/2015  . Chronic respiratory failure [J96.10] 02/16/2015    Total Time spent with patient: 1 hour  Subjective:   Tyler [name] is a 59 y.o. male patient admitted with "yes I'm depressed". Patient was referred for evaluation of depression by the hospitalist based on his appearance.  HPI:  Information from the patient and the chart. Patient says that his mood does feel down. He's been feeling more depressed recently and it's gradually been getting worse. He has multiple severe medical problems which she feels are hopeless. He is feeling more helpless and has thoughts at times about wishing that he were dead. He does not have any intention or plan to harm himself. He does not report any hallucinations or psychotic symptoms. His energy level is very low when he has little interest in anything. This is been present now for months with some of the symptoms going back years. The severity of his medical problems are certainly making it worse. He has been compliant with medication including the current 20 mg of paroxetine that he is taking. Denies that he is abusing drugs or alcohol. Stresses at home are chronic. He has some difficulty getting along with his family but he blames most of them on himself.  Past psychiatric history: No psychiatric  hospitalization no suicide attempts. He has been treated with Paxil 20 mg a day for years based on an observation that he had a problem with irritability and anger. He's had anxiety problems through the years. It sounds like at one point he was prescribed benzodiazepine's but was not continued on them.  Social history: Patient lives at home with his wife and son. Patient not able to work. Spends almost all of his time in the house.  Medical history: Patient has congestive heart failure with bilateral edema, abdominal swelling, COPD and history of atrial fibrillation. He is in the hospital with worsening of his heart failure.  Family history: His brother committed suicide as did his grandfather.  Substance abuse history: He reports that he used to drink a lot when he was in his 108s but gave it up. Never had a problem with any drug abuse denies that he ever abused prescription medicine.  Patient reports that at one time his primary care doctor tried to increase his Paxil dose from 20 mg to 30 mg a day and it made him jittery and agitated and so they went back to the current dose. He is not aware of being on any other antidepressives.  Consult update as of Saturday the 23rd. Patient reports that he is still feeling bad. Very uncomfortable. Lots of physical problems. Mood is still depressed. He doesn't feel any better. No intent to harm himself currently but general hopelessness. No sign of psychosis. No side effects from psychiatric medicine. HPI Elements:   Quality:  Depressed mood fatigue and anhedonia. Severity:  Severe worsening his medical problems potentially life threatening.  Timing:  Getting worse over the last couple weeks gradually. Duration:  Long-standing problem related to his medical illness. Context:  Severe chronic life-threatening medical problems causing severe dysfunction and discomfort.  Past Medical History:  Past Medical History  Diagnosis Date  . Cataract   . CHF  (congestive heart failure)   . COPD (chronic obstructive pulmonary disease)   . Diabetes mellitus without complication   . Sleep apnea   . Oxygen deficiency   . Thyroid disease   . Gout   . Hypothyroidism   . CKD (chronic kidney disease) stage 3, GFR 30-59 ml/min   . Asthma   . Hypertension     Past Surgical History  Procedure Laterality Date  . Cholecystectomy      2002  . Eye surgery      right cataract removed 3/21  . Vena cava filter placement     Family History:  Family History  Problem Relation Age of Onset  . Diabetes Sister   . Diabetes Brother    Social History:  History  Alcohol Use No     History  Drug Use No    History   Social History  . Marital Status: Married    Spouse Name: N/A  . Number of Children: N/A  . Years of Education: N/A   Social History Main Topics  . Smoking status: Current Every Day Smoker -- 2.00 packs/day for 30 years    Types: Cigarettes, Cigars  . Smokeless tobacco: Never Used     Comment: cigar 3-4 yrs 1-3 per day/former Cigarettes  . Alcohol Use: No  . Drug Use: No  . Sexual Activity: Not on file   Other Topics Concern  . None   Social History Narrative   Additional Social History:                          Allergies:   Allergies  Allergen Reactions  . Enalapril Cough  . Lovastatin Other (See Comments)    tachycardia  . Atorvastatin Palpitations    Labs:  Results for orders placed or performed during the hospital encounter of 04/09/15 (from the past 48 hour(s))  Glucose, capillary     Status: None   Collection Time: 04/11/15  5:28 PM  Result Value Ref Range   Glucose-Capillary 84 65 - 99 mg/dL   Comment 1 Notify RN   Glucose, capillary     Status: Abnormal   Collection Time: 04/11/15  8:41 PM  Result Value Ref Range   Glucose-Capillary 47 (L) 65 - 99 mg/dL   Comment 1 Notify RN   Glucose, capillary     Status: Abnormal   Collection Time: 04/11/15  8:59 PM  Result Value Ref Range    Glucose-Capillary 61 (L) 65 - 99 mg/dL   Comment 1 Notify RN   Glucose, capillary     Status: None   Collection Time: 04/11/15 10:27 PM  Result Value Ref Range   Glucose-Capillary 80 65 - 99 mg/dL   Comment 1 Notify RN   Glucose, capillary     Status: Abnormal   Collection Time: 04/12/15  4:07 AM  Result Value Ref Range   Glucose-Capillary 46 (L) 65 - 99 mg/dL  Glucose, capillary     Status: Abnormal   Collection Time: 04/12/15  4:31 AM  Result Value Ref Range   Glucose-Capillary 61 (L) 65 - 99 mg/dL  CBC with Differential/Platelet     Status: Abnormal  Collection Time: 04/12/15  5:04 AM  Result Value Ref Range   WBC 6.2 3.8 - 10.6 K/uL   RBC 3.83 (L) 4.40 - 5.90 MIL/uL   Hemoglobin 10.2 (L) 13.0 - 18.0 g/dL   HCT 31.6 (L) 40.0 - 52.0 %   MCV 82.5 80.0 - 100.0 fL   MCH 26.8 26.0 - 34.0 pg   MCHC 32.4 32.0 - 36.0 g/dL   RDW 16.7 (H) 11.5 - 14.5 %   Platelets 197 150 - 440 K/uL   Neutrophils Relative % 77 %   Neutro Abs 4.8 1.4 - 6.5 K/uL   Lymphocytes Relative 11 %   Lymphs Abs 0.7 (L) 1.0 - 3.6 K/uL   Monocytes Relative 9 %   Monocytes Absolute 0.5 0.2 - 1.0 K/uL   Eosinophils Relative 2 %   Eosinophils Absolute 0.1 0 - 0.7 K/uL   Basophils Relative 1 %   Basophils Absolute 0.1 0 - 0.1 K/uL  Comprehensive metabolic panel     Status: Abnormal   Collection Time: 04/12/15  5:04 AM  Result Value Ref Range   Sodium 131 (L) 135 - 145 mmol/L   Potassium 3.9 3.5 - 5.1 mmol/L   Chloride 89 (L) 101 - 111 mmol/L   CO2 30 22 - 32 mmol/L   Glucose, Bld 77 65 - 99 mg/dL   BUN 35 (H) 6 - 20 mg/dL   Creatinine, Ser 2.81 (H) 0.61 - 1.24 mg/dL   Calcium 8.6 (L) 8.9 - 10.3 mg/dL   Total Protein 6.6 6.5 - 8.1 g/dL   Albumin 3.1 (L) 3.5 - 5.0 g/dL   AST 22 15 - 41 U/L   ALT 8 (L) 17 - 63 U/L   Alkaline Phosphatase 180 (H) 38 - 126 U/L   Total Bilirubin 0.6 0.3 - 1.2 mg/dL   GFR calc non Af Amer 23 (L) >60 mL/min   GFR calc Af Amer 27 (L) >60 mL/min    Comment: (NOTE) The eGFR has  been calculated using the CKD EPI equation. This calculation has not been validated in all clinical situations. eGFR's persistently <60 mL/min signify possible Chronic Kidney Disease.    Anion gap 12 5 - 15  Protime-INR     Status: Abnormal   Collection Time: 04/12/15  5:04 AM  Result Value Ref Range   Prothrombin Time 26.6 (H) 11.4 - 15.0 seconds   INR 2.44   Glucose, capillary     Status: Abnormal   Collection Time: 04/12/15  7:26 AM  Result Value Ref Range   Glucose-Capillary 54 (L) 65 - 99 mg/dL  Glucose, capillary     Status: Abnormal   Collection Time: 04/12/15  7:37 AM  Result Value Ref Range   Glucose-Capillary 28 (LL) 65 - 99 mg/dL   Comment 1 Call MD NNP PA CNM   Glucose, capillary     Status: None   Collection Time: 04/12/15  8:02 AM  Result Value Ref Range   Glucose-Capillary 67 65 - 99 mg/dL  Glucose, capillary     Status: Abnormal   Collection Time: 04/12/15 11:04 AM  Result Value Ref Range   Glucose-Capillary 123 (H) 65 - 99 mg/dL  Glucose, capillary     Status: Abnormal   Collection Time: 04/12/15  4:22 PM  Result Value Ref Range   Glucose-Capillary 146 (H) 65 - 99 mg/dL  Glucose, capillary     Status: Abnormal   Collection Time: 04/12/15  8:28 PM  Result Value Ref Range  Glucose-Capillary 144 (H) 65 - 99 mg/dL   Comment 1 Notify RN   CBC with Differential/Platelet     Status: Abnormal   Collection Time: 04/13/15  4:47 AM  Result Value Ref Range   WBC 5.4 3.8 - 10.6 K/uL   RBC 3.62 (L) 4.40 - 5.90 MIL/uL   Hemoglobin 9.9 (L) 13.0 - 18.0 g/dL   HCT 30.0 (L) 40.0 - 52.0 %   MCV 83.0 80.0 - 100.0 fL   MCH 27.3 26.0 - 34.0 pg   MCHC 32.9 32.0 - 36.0 g/dL   RDW 16.8 (H) 11.5 - 14.5 %   Platelets 187 150 - 440 K/uL   Neutrophils Relative % 71 %   Neutro Abs 3.9 1.4 - 6.5 K/uL   Lymphocytes Relative 13 %   Lymphs Abs 0.7 (L) 1.0 - 3.6 K/uL   Monocytes Relative 12 %   Monocytes Absolute 0.6 0.2 - 1.0 K/uL   Eosinophils Relative 3 %   Eosinophils  Absolute 0.2 0 - 0.7 K/uL   Basophils Relative 1 %   Basophils Absolute 0.0 0 - 0.1 K/uL  Comprehensive metabolic panel     Status: Abnormal   Collection Time: 04/13/15  4:47 AM  Result Value Ref Range   Sodium 130 (L) 135 - 145 mmol/L   Potassium 4.7 3.5 - 5.1 mmol/L   Chloride 90 (L) 101 - 111 mmol/L   CO2 28 22 - 32 mmol/L   Glucose, Bld 138 (H) 65 - 99 mg/dL   BUN 37 (H) 6 - 20 mg/dL   Creatinine, Ser 2.83 (H) 0.61 - 1.24 mg/dL   Calcium 8.2 (L) 8.9 - 10.3 mg/dL   Total Protein 5.9 (L) 6.5 - 8.1 g/dL   Albumin 2.8 (L) 3.5 - 5.0 g/dL   AST 19 15 - 41 U/L   ALT 8 (L) 17 - 63 U/L   Alkaline Phosphatase 178 (H) 38 - 126 U/L   Total Bilirubin 0.8 0.3 - 1.2 mg/dL   GFR calc non Af Amer 23 (L) >60 mL/min   GFR calc Af Amer 27 (L) >60 mL/min    Comment: (NOTE) The eGFR has been calculated using the CKD EPI equation. This calculation has not been validated in all clinical situations. eGFR's persistently <60 mL/min signify possible Chronic Kidney Disease.    Anion gap 12 5 - 15  Protime-INR     Status: Abnormal   Collection Time: 04/13/15  4:47 AM  Result Value Ref Range   Prothrombin Time 28.0 (H) 11.4 - 15.0 seconds   INR 2.61   Glucose, capillary     Status: Abnormal   Collection Time: 04/13/15  8:11 AM  Result Value Ref Range   Glucose-Capillary 151 (H) 65 - 99 mg/dL  Glucose, capillary     Status: Abnormal   Collection Time: 04/13/15 11:44 AM  Result Value Ref Range   Glucose-Capillary 160 (H) 65 - 99 mg/dL   Comment 1 Notify RN     Vitals: Blood pressure 118/87, pulse 82, temperature 98.9 F (37.2 C), temperature source Oral, resp. rate 20, height 6' (1.829 m), weight 164.384 kg (362 lb 6.4 oz), SpO2 97 %.  Risk to Self: Is patient at risk for suicide?: No Risk to Others:   Prior Inpatient Therapy:   Prior Outpatient Therapy:    Current Facility-Administered Medications  Medication Dose Route Frequency Provider Last Rate Last Dose  . 0.9 %  sodium chloride  infusion   Intravenous Continuous Idelle Crouch, MD  50 mL/hr at 04/13/15 0036    . acetaminophen (TYLENOL) tablet 650 mg  650 mg Oral Q6H PRN Lytle Butte, MD   650 mg at 04/13/15 2446   Or  . acetaminophen (TYLENOL) suppository 650 mg  650 mg Rectal Q6H PRN Lytle Butte, MD      . cyanocobalamin ((VITAMIN B-12)) injection 1,000 mcg  1,000 mcg Intramuscular Q30 days Lytle Butte, MD   1,000 mcg at 04/10/15 0105  . gabapentin (NEURONTIN) capsule 100 mg  100 mg Oral BID Lytle Butte, MD   100 mg at 04/13/15 2863  . insulin aspart (novoLOG) injection 0-15 Units  0-15 Units Subcutaneous TID WC Lytle Butte, MD   3 Units at 04/13/15 1159  . insulin aspart (novoLOG) injection 0-5 Units  0-5 Units Subcutaneous QHS Lytle Butte, MD   2 Units at 04/10/15 2115  . ipratropium-albuterol (DUONEB) 0.5-2.5 (3) MG/3ML nebulizer solution 3 mL  3 mL Nebulization QID Idelle Crouch, MD   3 mL at 04/13/15 1105  . lactulose (CHRONULAC) 10 GM/15ML solution 30 g  30 g Oral BID PRN Lytle Butte, MD   30 g at 04/13/15 1309  . levothyroxine (SYNTHROID, LEVOTHROID) tablet 100 mcg  100 mcg Oral QAC breakfast Lytle Butte, MD   100 mcg at 04/13/15 8177  . mirtazapine (REMERON) tablet 15 mg  15 mg Oral QHS Gonzella Lex, MD   15 mg at 04/12/15 2129  . morphine 2 MG/ML injection 2 mg  2 mg Intravenous Q4H PRN Lytle Butte, MD   2 mg at 04/12/15 1053  . ondansetron (ZOFRAN) tablet 4 mg  4 mg Oral Q6H PRN Lytle Butte, MD       Or  . ondansetron [facility]) injection 4 mg  4 mg Intravenous Q6H PRN Lytle Butte, MD      . PARoxetine (PAXIL) tablet 20 mg  20 mg Oral Daily Lytle Butte, MD   20 mg at 04/13/15 1165  . polyethylene glycol (MIRALAX / GLYCOLAX) packet 17 g  17 g Oral Daily Lytle Butte, MD   17 g at 04/13/15 0829  . pravastatin (PRAVACHOL) tablet 40 mg  40 mg Oral QHS Lytle Butte, MD   40 mg at 04/12/15 2129  . salmeterol (SEREVENT) diskus inhaler 1 puff  1 puff Inhalation Q12H Idelle Crouch, MD   1  puff at 04/13/15 [phone]  . sodium chloride 0.9 % injection 3 mL  3 mL Intravenous PRN Idelle Crouch, MD      . spironolactone (ALDACTONE) tablet 25 mg  25 mg Oral Daily Lytle Butte, MD   25 mg at 04/13/15 8333  . tamsulosin (FLOMAX) capsule 0.4 mg  0.4 mg Oral Daily Idelle Crouch, MD   0.4 mg at 04/13/15 8329  . tiotropium (SPIRIVA) inhalation capsule 18 mcg  18 mcg Inhalation Daily Idelle Crouch, MD   18 mcg at 04/13/15 0827  . vitamin B-12 (CYANOCOBALAMIN) tablet 2,000 mcg  2,000 mcg Oral Daily Lytle Butte, MD   2,000 mcg at 04/13/15 1916  . warfarin (COUMADIN) tablet 10 mg  10 mg Oral Once per day on Mon Fri Lytle Butte, MD   10 mg at 04/12/15 1713  . warfarin (COUMADIN) tablet 5 mg  5 mg Oral Once per day on Sun Tue Wed Thu Sat Idelle Crouch, MD   5 mg at 04/11/15 1732    Musculoskeletal: Strength &  Muscle Tone: within normal limits Gait & Station: unsteady Patient leans: N/A  Psychiatric Specialty Exam: Physical Exam  Constitutional: He appears well-developed and well-nourished. He appears lethargic. He is cooperative. He has a sickly appearance.  HENT:  Head: Normocephalic and atraumatic.  Eyes: Conjunctivae are normal. Pupils are equal, round, and reactive to light.  Neck: Normal range of motion.  Cardiovascular: Normal heart sounds.   Respiratory: Effort normal.  GI: Soft. He exhibits distension and ascites.  Musculoskeletal: Normal range of motion.       Right shoulder: He exhibits swelling.       Legs: Neurological: He appears lethargic.  Skin: Skin is warm and dry.  Psychiatric: Judgment normal. His speech is delayed. He is slowed. Cognition and memory are normal. He exhibits a depressed mood. He expresses suicidal ideation.  Patient presents as a chronically and acutely ill man who was cooperative with the interview. Eye contact intermittent. Psychomotor activity very sluggish. Speech is slow and decreased in amount that he was forthcoming and cooperative.  Affect dysphoric. Patient describes himself as being depressed. Passive thoughts of death but no intention to kill himself. No psychotic symptoms. Judgment and insight appear to be fairly adequate. Cognition appears intact.    Review of Systems  Constitutional: Positive for malaise/fatigue.  HENT: Negative.   Eyes: Negative.   Respiratory: Negative.   Cardiovascular: Positive for orthopnea and leg swelling.  Gastrointestinal: Positive for abdominal pain.  Genitourinary: Positive for dysuria.  Musculoskeletal: Negative.   Skin: Negative.   Neurological: Positive for weakness.  Psychiatric/Behavioral: Positive for depression and suicidal ideas. Negative for hallucinations, memory loss and substance abuse. The patient is nervous/anxious and has insomnia.     Blood pressure 118/87, pulse 82, temperature 98.9 F (37.2 C), temperature source Oral, resp. rate 20, height 6' (1.829 m), weight 164.384 kg (362 lb 6.4 oz), SpO2 97 %.Body mass index is 49.14 kg/(m^2).  General Appearance: Disheveled  Eye Sport and exercise psychologist::  Fair  Speech:  Slow  Volume:  Decreased  Mood:  Depressed  Affect:  Depressed  Thought Process:  Goal Directed  Orientation:  Full (Time, Place, and Person)  Thought Content:  Negative  Suicidal Thoughts:  Yes.  without intent/plan  Homicidal Thoughts:  No  Memory:  Immediate;   Good Recent;   Fair Remote;   Fair  Judgement:  Intact  Insight:  Fair  Psychomotor Activity:  Decreased  Concentration:  Fair  Recall:  [employer] [place]  Language: Fair  Akathisia:  No  Handed:  Right  AIMS (if indicated):     Assets:  Communication Skills Desire for Improvement Housing Intimacy Social Support  ADL's:  Intact  Cognition: WNL  Sleep:      Medical Decision Making: Review of Psycho-Social Stressors (1), Review or order clinical lab tests (1), Review and summation of old records (2), Established Problem, Worsening (2), Review or order medicine tests (1), Review of  Medication Regimen & Side Effects (2) and Review of New Medication or Change in Dosage (2)  Treatment Plan Summary: Medication management and Plan I will increase the dose of his Remeron to 30 mg at night. Continue monitoring and supportive counseling. No indication currently for psychiatric hospitalization. Needs continued treatment and management of depression along with his other medical problems. Plan explained to the patient.  Plan:  Supportive therapy provided about ongoing stressors. Discussed crisis plan, support from social network, calling 911, coming to the Emergency Department, and calling Suicide Hotline. Medication management Disposition: Continue monitoring  in the hospital  [place] 04/13/2015 2:26 PM

## 2015-04-13 NOTE — Progress Notes (Signed)
Tyler [name] is a 59 y.o. male  Anasarca   SUBJECTIVE: Admitted with anasarca, with acute renal failure with low UOP. On IVF overnight; UOP may be up slightly; I/O indicated 1L more in than out. Dyspnea about the same. C/o constipation. No fever, chills.  ______________________________________________________________________  ROS: See HPI; remainder of complete ROS is negative    Past Medical History  Diagnosis Date  . Cataract   . CHF (congestive heart failure)   . COPD (chronic obstructive pulmonary disease)   . Diabetes mellitus without complication   . Sleep apnea   . Oxygen deficiency   . Thyroid disease   . Gout   . Hypothyroidism   . CKD (chronic kidney disease) stage 3, GFR 30-59 ml/min   . Asthma   . Hypertension     Past Surgical History  Procedure Laterality Date  . Cholecystectomy      2002  . Eye surgery      right cataract removed 3/21  . Vena cava filter placement      PHYSICAL EXAM:  BP 128/84 mmHg  Pulse 81  Temp(Src) 97.8 F (36.6 C) (Oral)  Resp 19  Ht 6' (1.829 m)  Wt 164.384 kg (362 lb 6.4 oz)  BMI 49.14 kg/m2  SpO2 94%  Wt Readings from Last 3 Encounters:  04/13/15 164.384 kg (362 lb 6.4 oz)  03/26/15 161.934 kg (357 lb)  03/12/15 152.862 kg (337 lb)            Constitutional: morbidly obese male,NAD HEENT: PERRL; OP moist w/o lesions Neck: supple, no thyromegaly Respiratory: basilar rales, no wheezes or retractions Cardiovascular: RRR, no murmur, no gallop Abdomen: soft, good BS, nontender. Distended Extremities: 3+ edema bilaterally to knees Neuro: alert; moves all ext Derm: venous stasis changes Lymph: no cervical or supraclavicular lymphadenopathy  ASSESSMENT/PLAN:  Labs and imaging studies were reviewed  1. CHF/anasarca/acute renal failure- unable to effectively diurese due to renal failure; low rate IVF as per nephrology, but not clear how long this can be done w/o worsening fluid status.  Paracentesis  unsuccessful.  Suspect will need shortterm dialysis to correct fluid status 2. COPD- inhaled meds; following exam.

## 2015-04-14 LAB — BASIC METABOLIC PANEL
Anion gap: 9 (ref 5–15)
BUN: 39 mg/dL — AB (ref 6–20)
CHLORIDE: 92 mmol/L — AB (ref 101–111)
CO2: 30 mmol/L (ref 22–32)
CREATININE: 2.56 mg/dL — AB (ref 0.61–1.24)
Calcium: 8.3 mg/dL — ABNORMAL LOW (ref 8.9–10.3)
GFR calc non Af Amer: 26 mL/min — ABNORMAL LOW (ref >60)
GFR, EST AFRICAN AMERICAN: 30 mL/min — AB (ref >60)
Glucose, Bld: 157 mg/dL — ABNORMAL HIGH (ref 65–99)
Potassium: 4.4 mmol/L (ref 3.5–5.1)
Sodium: 131 mmol/L — ABNORMAL LOW (ref 135–145)

## 2015-04-14 LAB — CBC
HEMATOCRIT: 30.6 % — AB (ref 40.0–52.0)
Hemoglobin: 10.1 g/dL — ABNORMAL LOW (ref 13.0–18.0)
MCH: 27.4 pg (ref 26.0–34.0)
MCHC: 33.1 g/dL (ref 32.0–36.0)
MCV: 83 fL (ref 80.0–100.0)
Platelets: 183 10*3/uL (ref 150–440)
RBC: 3.69 MIL/uL — ABNORMAL LOW (ref 4.40–5.90)
RDW: 16.7 % — AB (ref 11.5–14.5)
WBC: 5.1 10*3/uL (ref 3.8–10.6)

## 2015-04-14 LAB — GLUCOSE, CAPILLARY
GLUCOSE-CAPILLARY: 150 mg/dL — AB (ref 65–99)
GLUCOSE-CAPILLARY: 165 mg/dL — AB (ref 65–99)
Glucose-Capillary: 167 mg/dL — ABNORMAL HIGH (ref 65–99)
Glucose-Capillary: 192 mg/dL — ABNORMAL HIGH (ref 65–99)

## 2015-04-14 MED ORDER — IPRATROPIUM-ALBUTEROL 0.5-2.5 (3) MG/3ML IN SOLN: 3.0000 mL | RESPIRATORY_TRACT | Status: DC | PRN

## 2015-04-14 MED ORDER — ALUM & MAG HYDROXIDE-SIMETH 200-200-20 MG/5ML PO SUSP: 30.0000 mL | Freq: Four times a day (QID) | ORAL | Status: DC | PRN

## 2015-04-14 NOTE — Progress Notes (Signed)
Tyler [name] is a 59 y.o. male  Anasarca   SUBJECTIVE: Admitted with anasarca, with acute renal failure with low UOP; treatment options have been limited by worsening renal function, and so has been off diuretics.  Had been receiving IVF; held now, with creatinine slightly improved. Wt is down just over 1 lb.  Breathing is stable; generally feels uncomfortable.  No fever, chills..  ______________________________________________________________________  ROS: See HPI; remainder of complete ROS is negative    Past Medical History  Diagnosis Date  . Cataract   . CHF (congestive heart failure)   . COPD (chronic obstructive pulmonary disease)   . Diabetes mellitus without complication   . Sleep apnea   . Oxygen deficiency   . Thyroid disease   . Gout   . Hypothyroidism   . CKD (chronic kidney disease) stage 3, GFR 30-59 ml/min   . Asthma   . Hypertension     Past Surgical History  Procedure Laterality Date  . Cholecystectomy      2002  . Eye surgery      right cataract removed 3/21  . Vena cava filter placement      PHYSICAL EXAM:  BP 107/63 mmHg  Pulse 76  Temp(Src) 98.2 F (36.8 C) (Oral)  Resp 20  Ht 6' (1.829 m)  Wt 163.34 kg (360 lb 1.6 oz)  BMI 48.83 kg/m2  SpO2 99%  Wt Readings from Last 3 Encounters:  04/14/15 163.34 kg (360 lb 1.6 oz)  03/26/15 161.934 kg (357 lb)  03/12/15 152.862 kg (337 lb)            Constitutional: morbidly obese male,NAD HEENT: PERRL; OP moist w/o lesions Neck: supple, no thyromegaly Respiratory: basilar rales, no wheezes or retractions Cardiovascular: RRR, no murmur, no gallop Abdomen: soft, good BS, nontender, distended Extremities: 3+ edema bilaterally to knees; no pain to palpation Neuro: alert; moves all extremities Derm: bilateral LE venous stasis changes with minimal erythema Lymph: no cervical or supraclavicular lymphadenopathy  ASSESSMENT/PLAN:  Labs and imaging studies were reviewed  1.  CHF/anasarca/acute renal failure- appreciate nephrology; consider retrial of diuretics starting tomorrow.  Paracentesis unsuccessful.  If renal fxn worsens again on diuretics, will likely need shortterm dialysis to correct fluid status 2. COPD- stable with inhaled meds; following exam 3. Depression- appreciate behavioral medicine input 4. 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 AODM- reasonable control; cover with SSI 6. Hypothyroid- cont current treatment

## 2015-04-14 NOTE — Progress Notes (Signed)
Pt. Complained of bloating and gas. Dr. Amado Coe notified, new order for malox, 30ml, PO Q6H, PRN. Will continue to monitor pt.

## 2015-04-14 NOTE — Progress Notes (Signed)
[facility]  ROUNDING NOTE   Subjective:  Cr slightly improved. Now off IVFs.  UOP recorded yesterday was 400cc, ? If accurate. Objective:  Vital signs in last 24 hours:  Temp:  [97.8 F (36.6 C)-98.9 F (37.2 C)] 98.2 F (36.8 C) (07/24 0601) Pulse Rate:  [65-82] 76 (07/24 0601) Resp:  [20] 20 (07/24 0601) BP: (107-118)/(62-87) 107/63 mmHg (07/24 0601) SpO2:  [94 %-99 %] 99 % (07/24 0601) Weight:  [163.34 kg (360 lb 1.6 oz)] 163.34 kg (360 lb 1.6 oz) (07/24 0601)  Weight change: -1.043 kg (-2 lb 4.8 oz) Filed Weights   04/12/15 0353 04/13/15 0503 04/14/15 0601  Weight: 162.025 kg (357 lb 3.2 oz) 164.384 kg (362 lb 6.4 oz) 163.34 kg (360 lb 1.6 oz)    Intake/Output: I/O last 3 completed shifts: In: 360 [P.O.:360] Out: 950 [Urine:800; Stool:150]   Intake/Output this shift:  Total I/O In: 240 [P.O.:240] Out: 0   Physical Exam: General: NAD, obese, laying in bed  Head: Normocephalic, atraumatic. Moist oral mucosal membranes  Eyes: Anicteric  Neck: Supple, trachea midline  Lungs:  Basilar rales normal effort  Heart: Regular rate and rhythm S1S2 no rubs  Abdomen:  Soft, nontender, obese, BS present  Extremities:  2+ peripheral edema, tight  Neurologic: Nonfocal, moving all four extremities  Skin: No lesions  Access: none    Basic Metabolic Panel:  Recent Labs Lab 04/10/15 0700 04/11/15 0429 04/12/15 0504 04/13/15 0447 04/14/15 0434  NA 130* 129* 131* 130* 131*  K 3.3* 3.6 3.9 4.7 4.4  CL 88* 90* 89* 90* 92*  CO2 31 30 30 28 30   GLUCOSE 157* 140* 77 138* 157*  BUN 28* 33* 35* 37* 39*  CREATININE 1.42* 2.01* 2.81* 2.83* 2.56*  CALCIUM 8.4* 8.2* 8.6* 8.2* 8.3*    Liver Function Tests:  Recent Labs Lab 04/09/15 1906 04/11/15 0429 04/12/15 0504 04/13/15 0447  AST 22 22 22 19   ALT 9* 9* 8* 8*  ALKPHOS 228* 211* 180* 178*  BILITOT 0.9 0.6 0.6 0.8  PROT 6.9 6.4* 6.6 5.9*  ALBUMIN 2.8* 2.7* 3.1* 2.8*    Recent Labs Lab  04/09/15 1906  LIPASE 19*   No results for input(s): AMMONIA in the last 168 hours.  CBC:  Recent Labs Lab 04/10/15 0700 04/11/15 0429 04/12/15 0504 04/13/15 0447 04/14/15 0434  WBC 4.5 6.5 6.2 5.4 5.1  NEUTROABS  --  4.6 4.8 3.9  --   HGB 10.7* 11.2* 10.2* 9.9* 10.1*  HCT 32.8* 33.7* 31.6* 30.0* 30.6*  MCV 81.8 82.3 82.5 83.0 83.0  PLT 210 226 197 187 183    Cardiac Enzymes:  Recent Labs Lab 04/10/15 0120 04/10/15 0700 04/10/15 1256  TROPONINI 0.03 <0.03 <0.03    BNP: Invalid input(s): POCBNP  CBG:  Recent Labs Lab 04/13/15 0811 04/13/15 1144 04/13/15 1655 04/13/15 2033 04/14/15 0747  GLUCAP 151* 160* 194* 159* 150*    Microbiology: Results for orders placed or performed in visit on 08/21/14  ED Influenza     Status: None   Collection Time: 08/21/14  4:53 PM  Result Value Ref Range Status   Influenza A By PCR NEGATIVE NEGATIVE Final   Influenza B By PCR NEGATIVE NEGATIVE Final   H1N1 flu by pcr NOT DETECTED NOT-DETECTED Final    Comment:                  ----------------------- The Xpert Flu assay (FDA approval for nasal aspirates or washes and nasopharyngeal swab specimens),  is intended as an aid in the diagnosis of influenza and should not be used as a sole basis for treatment.     Coagulation Studies:  Recent Labs  04/12/15 0504 04/13/15 0447  LABPROT 26.6* 28.0*  INR 2.44 2.61    Urinalysis: No results for input(s): COLORURINE, LABSPEC, PHURINE, GLUCOSEU, HGBUR, BILIRUBINUR, KETONESUR, PROTEINUR, UROBILINOGEN, NITRITE, LEUKOCYTESUR in the last 72 hours.  Invalid input(s): APPERANCEUR    Imaging: Dg Chest 2 View  04/13/2015   CLINICAL DATA:  History of CHF, no current chest complaints  EXAM: CHEST - 2 VIEW  COMPARISON:  04/11/2015  FINDINGS: Cardiac shadow remains enlarged. Vascular congestion is again identified with mild interstitial edema. Persistent left basilar opacity is noted. No new focal abnormality is seen.  IMPRESSION:  Stable CHF.  Persistent left basilar opacity without significant change from the prior exam.   Electronically Signed   By: Alcide Clever M.D.   On: 04/13/2015 08:18     Medications:     . cyanocobalamin  1,000 mcg Intramuscular Q30 days  . gabapentin  100 mg Oral BID  . insulin aspart  0-15 Units Subcutaneous TID WC  . insulin aspart  0-5 Units Subcutaneous QHS  . levothyroxine  100 mcg Oral QAC breakfast  . mirtazapine  30 mg Oral QHS  . PARoxetine  20 mg Oral Daily  . polyethylene glycol  17 g Oral Daily  . pravastatin  40 mg Oral QHS  . salmeterol  1 puff Inhalation Q12H  . spironolactone  25 mg Oral Daily  . tamsulosin  0.4 mg Oral Daily  . tiotropium  18 mcg Inhalation Daily  . vitamin B-12  2,000 mcg Oral Daily  . warfarin  10 mg Oral Once per day on Mon Fri  . warfarin  5 mg Oral Once per day on Sun Tue Wed Thu Sat   acetaminophen **OR** acetaminophen, ipratropium-albuterol, lactulose, morphine injection, ondansetron **OR** ondansetron (ZOFRAN) IV, sodium chloride  Assessment/ Plan:  59 y.o. male CKD stage III baseline Cr 1.3, hypertension, morbid obesity, chronic diastolic heart failure, paroxysmal atrial fibrillation, sleep apnea, diabetes mellitus, chronic edema, now presents with increasing edema, shortness of breath, pericardial effusion, pleural effusion, ascites, anasarca.  1.  Acute renal failure/CKD stage III: Exposed to contrast upon admission. Has underlying heart failure with anasarca but had worsening renal function with diuresis.  In addition he has low oncotic pressure state from low albumin.   -Renal function slightly better in terms of Cr, but BUN higher.  Agree with stopping IVFs at this time.  As above renal function worsened with diuresis before.  However he appears to be in need of diuresis.  Will observe for today and consider restarting lasix gtt tomorrow.   2.  Acute diastolic heart failure:  Difficult to manage given underlying pulmonary HTN, low  albumin, and pre load dependency of his kidneys.  Lasix currently on hold, but consider restarting lasix gtt tomorrow.  Agree with Dr. Graciela Husbands that he may end up needing dialysis to perform diuresis effectively.  3.  Hyponatremia:  Na stable at 131, continue to monitor.  LOS: 4 Tyler [name] 7/24/201610:28 AM

## 2015-04-15 ENCOUNTER — Ambulatory Visit: Payer: Commercial Managed Care - HMO | Admitting: Internal Medicine

## 2015-04-15 LAB — PROTEIN ELECTROPHORESIS, SERUM
A/G RATIO SPE: 0.8 (ref 0.7–1.7)
Albumin ELP: 2.6 g/dL — ABNORMAL LOW (ref 2.9–4.4)
Alpha-1-Globulin: 0.3 g/dL (ref 0.0–0.4)
Alpha-2-Globulin: 0.7 g/dL (ref 0.4–1.0)
Beta Globulin: 1 g/dL (ref 0.7–1.3)
Gamma Globulin: 1.1 g/dL (ref 0.4–1.8)
Globulin, Total: 3.1 g/dL (ref 2.2–3.9)
Total Protein ELP: 5.7 g/dL — ABNORMAL LOW (ref 6.0–8.5)

## 2015-04-15 LAB — PROTIME-INR
INR: 2.41
PROTHROMBIN TIME: 26.4 s — AB (ref 11.4–15.0)

## 2015-04-15 LAB — BASIC METABOLIC PANEL
Anion gap: 11 (ref 5–15)
BUN: 37 mg/dL — ABNORMAL HIGH (ref 6–20)
CALCIUM: 8.4 mg/dL — AB (ref 8.9–10.3)
CHLORIDE: 95 mmol/L — AB (ref 101–111)
CO2: 28 mmol/L (ref 22–32)
Creatinine, Ser: 2.16 mg/dL — ABNORMAL HIGH (ref 0.61–1.24)
GFR calc Af Amer: 37 mL/min — ABNORMAL LOW (ref >60)
GFR calc non Af Amer: 32 mL/min — ABNORMAL LOW (ref >60)
Glucose, Bld: 155 mg/dL — ABNORMAL HIGH (ref 65–99)
Potassium: 3.7 mmol/L (ref 3.5–5.1)
SODIUM: 134 mmol/L — AB (ref 135–145)

## 2015-04-15 LAB — GLUCOSE, CAPILLARY
GLUCOSE-CAPILLARY: 167 mg/dL — AB (ref 65–99)
Glucose-Capillary: 186 mg/dL — ABNORMAL HIGH (ref 65–99)
Glucose-Capillary: 185 mg/dL — ABNORMAL HIGH (ref 65–99)
Glucose-Capillary: 158 mg/dL — ABNORMAL HIGH (ref 65–99)

## 2015-04-15 MED ORDER — POTASSIUM CHLORIDE 20 MEQ PO PACK
20.0000 meq | PACK | Freq: Every day | ORAL | Status: DC
Start: 2015-04-15 — End: 2015-04-19
  Administered 2015-04-15 – 2015-04-18 (×4): 20 meq via ORAL
  Filled 2015-04-15 (×4): qty 1

## 2015-04-15 MED ORDER — FUROSEMIDE 10 MG/ML IJ SOLN
4.0000 mg/h | INTRAVENOUS | Status: DC
  Administered 2015-04-15 – 2015-04-17 (×2): 4 mg/h via INTRAVENOUS
  Filled 2015-04-15 (×2): qty 25

## 2015-04-15 NOTE — Consult Note (Signed)
Reason for Consult: congestive heart failure, atrial fibrillation Referring Physician:  Dr. [name] ,Dr Valentino Nose  hospitalist  Tyler [name] is an 59 y.o. male.  HPI:  59 year old male with history of COPD chronic history failure 2.5 L chronically diabetes moderate pulmonary hypertension who presented with abdominal discomfort. 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 The patient has been on disability because of his diabetes he is morbidly obese and appears to have significant noncompliance issues. Patient denied any chest pain blackout spells of syncope but has chronic obesity hypoxemia pulmonary hypertension and COPD symptoms.  Past Medical History  Diagnosis Date  . Cataract   . CHF (congestive heart failure)   . COPD (chronic obstructive pulmonary disease)   . Diabetes mellitus without complication   . Sleep apnea   . Oxygen deficiency   . Thyroid disease   . Gout   . Hypothyroidism   . CKD (chronic kidney disease) stage 3, GFR 30-59 ml/min   . Asthma   . Hypertension     Past Surgical History  Procedure Laterality Date  . Cholecystectomy      2002  . Eye surgery      right cataract removed 3/21  . Vena cava filter placement      Family History  Problem Relation Age of Onset  . Diabetes Sister   . Diabetes Brother     Social History:  reports that he has been smoking Cigarettes and Cigars.  He has a 60 pack-year smoking history. He has never used smokeless tobacco. He reports that he does not drink alcohol or use illicit drugs.  Allergies:  Allergies  Allergen Reactions  . Enalapril Cough  . Lovastatin Other (See Comments)    tachycardia  . Atorvastatin Palpitations    Medications: I have reviewed  the patient's current medications.  Results for orders placed or performed during the hospital encounter of 04/09/15 (from the past 48 hour(s))  Glucose, capillary     Status: Abnormal   Collection Time: 04/13/15  8:33 PM  Result Value Ref Range   Glucose-Capillary 159 (H) 65 - 99 mg/dL  Basic metabolic panel     Status: Abnormal   Collection Time: 04/14/15  4:34 AM  Result Value Ref Range   Sodium 131 (L) 135 - 145 mmol/L   Potassium 4.4 3.5 - 5.1 mmol/L   Chloride 92 (L) 101 - 111 mmol/L   CO2 30 22 - 32 mmol/L   Glucose, Bld 157 (H) 65 - 99 mg/dL   BUN 39 (H) 6 - 20 mg/dL   Creatinine, Ser 2.56 (H) 0.61 - 1.24 mg/dL   Calcium 8.3 (L) 8.9 - 10.3 mg/dL   GFR calc non Af Amer 26 (L) >60 mL/min   GFR calc Af Amer 30 (L) >60 mL/min    Comment: (NOTE) The eGFR has been calculated using the CKD EPI equation. This calculation has not been validated in all clinical situations. eGFR's persistently <60 mL/min signify possible Chronic Kidney Disease.    Anion gap 9 5 - 15  CBC     Status: Abnormal   Collection Time: 04/14/15  4:34 AM  Result Value Ref Range   WBC 5.1 3.8 - 10.6 K/uL   RBC 3.69 (L)  4.40 - 5.90 MIL/uL   Hemoglobin 10.1 (L) 13.0 - 18.0 g/dL   HCT 30.6 (L) 40.0 - 52.0 %   MCV 83.0 80.0 - 100.0 fL   MCH 27.4 26.0 - 34.0 pg   MCHC 33.1 32.0 - 36.0 g/dL   RDW 16.7 (H) 11.5 - 14.5 %   Platelets 183 150 - 440 K/uL  Glucose, capillary     Status: Abnormal   Collection Time: 04/14/15  7:47 AM  Result Value Ref Range   Glucose-Capillary 150 (H) 65 - 99 mg/dL  Glucose, capillary     Status: Abnormal   Collection Time: 04/14/15 11:39 AM  Result Value Ref Range   Glucose-Capillary 167 (H) 65 - 99 mg/dL  Glucose, capillary     Status: Abnormal   Collection Time: 04/14/15  4:22 PM  Result Value Ref Range   Glucose-Capillary 192 (H) 65 - 99 mg/dL  Glucose, capillary     Status: Abnormal   Collection Time: 04/14/15  8:55 PM  Result Value Ref Range   Glucose-Capillary 165  (H) 65 - 99 mg/dL  Basic metabolic panel     Status: Abnormal   Collection Time: 04/15/15  4:35 AM  Result Value Ref Range   Sodium 134 (L) 135 - 145 mmol/L   Potassium 3.7 3.5 - 5.1 mmol/L   Chloride 95 (L) 101 - 111 mmol/L   CO2 28 22 - 32 mmol/L   Glucose, Bld 155 (H) 65 - 99 mg/dL   BUN 37 (H) 6 - 20 mg/dL   Creatinine, Ser 2.16 (H) 0.61 - 1.24 mg/dL   Calcium 8.4 (L) 8.9 - 10.3 mg/dL   GFR calc non Af Amer 32 (L) >60 mL/min   GFR calc Af Amer 37 (L) >60 mL/min    Comment: (NOTE) The eGFR has been calculated using the CKD EPI equation. This calculation has not been validated in all clinical situations. eGFR's persistently <60 mL/min signify possible Chronic Kidney Disease.    Anion gap 11 5 - 15  Glucose, capillary     Status: Abnormal   Collection Time: 04/15/15  8:35 AM  Result Value Ref Range   Glucose-Capillary 186 (H) 65 - 99 mg/dL  Protime-INR     Status: Abnormal   Collection Time: 04/15/15 12:00 PM  Result Value Ref Range   Prothrombin Time 26.4 (H) 11.4 - 15.0 seconds   INR 2.41   Glucose, capillary     Status: Abnormal   Collection Time: 04/15/15 12:03 PM  Result Value Ref Range   Glucose-Capillary 185 (H) 65 - 99 mg/dL  Glucose, capillary     Status: Abnormal   Collection Time: 04/15/15  4:04 PM  Result Value Ref Range   Glucose-Capillary 167 (H) 65 - 99 mg/dL    No results found.  Review of Systems  Constitutional: Negative.   HENT: Positive for congestion.   Eyes: Negative.   Respiratory: Positive for shortness of breath.   Cardiovascular: Positive for orthopnea and PND.  Gastrointestinal: Negative.   Genitourinary: Negative.   Musculoskeletal: Negative.   Skin: Negative.   Neurological: Negative.   Endo/Heme/Allergies: Negative.   Psychiatric/Behavioral: Negative.    Blood pressure 121/65, pulse 73, temperature 98.1 F (36.7 C), temperature source Oral, resp. rate 18, height 6' (1.829 m), weight 163.113 kg (359 lb 9.6 oz), SpO2 89  %. Physical Exam  Constitutional: He is oriented to person, place, and time. He appears well-developed and well-nourished.  HENT:  Head: Normocephalic.  Right Ear: External ear  normal.  Eyes: Conjunctivae are normal. Pupils are equal, round, and reactive to light.  Neck: Normal range of motion. Neck supple.  Cardiovascular: Normal rate and normal heart sounds.   Respiratory: Effort normal and breath sounds normal.  GI: Soft. Bowel sounds are normal.  Musculoskeletal: Normal range of motion.  Neurological: He is alert and oriented to person, place, and time.  Skin: Skin is warm and dry.    Assessment/Plan:  congestive heart failure diastolic dysfunction  atrial fibrillation  COPD  diabetes type 2  obstructive sleep apnea  noncompliance  chronic renal insufficiency  asthma  hypertension  hypoxemia  chronic leg edema  morbid obesity  history of DVT . PLAN  continue supplemental oxygen therapy  recommend diuretic therapy with continuous Lasix  inhalers for COPD  recommend sleep study CPAP and weight loss 4 for drugs of sleep apnea  continue blood pressure control  support stockings for leg edema in addition to diuretics  consider echocardiogram for assessment of LV function and heart failure  continue Coumadin therapy for atrial fibrillation as well as history of DVT  advised patient to quit smoking  CALLWOOD,DWAYNE D. 04/15/2015, 5:48 PM

## 2015-04-15 NOTE — Progress Notes (Signed)
[facility]  ROUNDING NOTE   Subjective:  Patient sitting at side of bed.  UOP  750. Using urinal  Furosemide gtt 4mg  /hour Continues to complain of edema, ascites and shortness of breath.   Creatinine 2.16 (2.56) Objective:  Vital signs in last 24 hours:  Temp:  [97.7 F (36.5 C)-98.4 F (36.9 C)] 97.9 F (36.6 C) (07/25 0458) Pulse Rate:  [63-80] 63 (07/25 0458) Resp:  [18-22] 22 (07/25 0458) BP: (92-112)/(54-64) 92/54 mmHg (07/25 0458) SpO2:  [94 %-96 %] 96 % (07/25 0458) Weight:  [163.113 kg (359 lb 9.6 oz)] 163.113 kg (359 lb 9.6 oz) (07/25 0458)  Weight change: -0.227 kg (-8 oz) Filed Weights   04/13/15 0503 04/14/15 0601 04/15/15 0458  Weight: 164.384 kg (362 lb 6.4 oz) 163.34 kg (360 lb 1.6 oz) 163.113 kg (359 lb 9.6 oz)    Intake/Output: I/O last 3 completed shifts: In: 360 [P.O.:360] Out: 750 [Urine:750]   Intake/Output this shift:  Total I/O In: 240 [P.O.:240] Out: -   Physical Exam: General: NAD, obese, sitting  Head: Normocephalic, atraumatic. Moist oral mucosal membranes  Eyes: Anicteric  Neck: Supple, trachea midline  Lungs:  Bilateral rales and wheezing  Heart: Regular rate and rhythm S1S2 no rubs  Abdomen:  Soft, nontender, obese, BS present, abdominal wall edema  Extremities:  2+ peripheral edema, tight, + compression stockings.   Neurologic: Nonfocal, moving all four extremities  Skin: No lesions  Access: none    Basic Metabolic Panel:  Recent Labs Lab 04/11/15 0429 04/12/15 0504 04/13/15 0447 04/14/15 0434 04/15/15 0435  NA 129* 131* 130* 131* 134*  K 3.6 3.9 4.7 4.4 3.7  CL 90* 89* 90* 92* 95*  CO2 30 30 28 30 28   GLUCOSE 140* 77 138* 157* 155*  BUN 33* 35* 37* 39* 37*  CREATININE 2.01* 2.81* 2.83* 2.56* 2.16*  CALCIUM 8.2* 8.6* 8.2* 8.3* 8.4*    Liver Function Tests:  Recent Labs Lab 04/09/15 1906 04/11/15 0429 04/12/15 0504 04/13/15 0447  AST 22 22 22 19   ALT 9* 9* 8* 8*  ALKPHOS 228* 211* 180* 178*   BILITOT 0.9 0.6 0.6 0.8  PROT 6.9 6.4* 6.6 5.9*  ALBUMIN 2.8* 2.7* 3.1* 2.8*    Recent Labs Lab 04/09/15 1906  LIPASE 19*   No results for input(s): AMMONIA in the last 168 hours.  CBC:  Recent Labs Lab 04/10/15 0700 04/11/15 0429 04/12/15 0504 04/13/15 0447 04/14/15 0434  WBC 4.5 6.5 6.2 5.4 5.1  NEUTROABS  --  4.6 4.8 3.9  --   HGB 10.7* 11.2* 10.2* 9.9* 10.1*  HCT 32.8* 33.7* 31.6* 30.0* 30.6*  MCV 81.8 82.3 82.5 83.0 83.0  PLT 210 226 197 187 183    Cardiac Enzymes:  Recent Labs Lab 04/10/15 0120 04/10/15 0700 04/10/15 1256  TROPONINI 0.03 <0.03 <0.03    BNP: Invalid input(s): POCBNP  CBG:  Recent Labs Lab 04/14/15 0747 04/14/15 1139 04/14/15 1622 04/14/15 2055 04/15/15 0835  GLUCAP 150* 167* 192* 165* 186*    Microbiology: Results for orders placed or performed in visit on 08/21/14  ED Influenza     Status: None   Collection Time: 08/21/14  4:53 PM  Result Value Ref Range Status   Influenza A By PCR NEGATIVE NEGATIVE Final   Influenza B By PCR NEGATIVE NEGATIVE Final   H1N1 flu by pcr NOT DETECTED NOT-DETECTED Final    Comment:                  -----------------------  The Xpert Flu assay (FDA approval for nasal aspirates or washes and nasopharyngeal swab specimens), is intended as an aid in the diagnosis of influenza and should not be used as a sole basis for treatment.     Coagulation Studies:  Recent Labs  04/13/15 0447  LABPROT 28.0*  INR 2.61    Urinalysis: No results for input(s): COLORURINE, LABSPEC, PHURINE, GLUCOSEU, HGBUR, BILIRUBINUR, KETONESUR, PROTEINUR, UROBILINOGEN, NITRITE, LEUKOCYTESUR in the last 72 hours.  Invalid input(s): APPERANCEUR    Imaging: No results found.   Medications:   . furosemide (LASIX) infusion 4 mg/hr (04/15/15 0812)   . cyanocobalamin  1,000 mcg Intramuscular Q30 days  . gabapentin  100 mg Oral BID  . insulin aspart  0-15 Units Subcutaneous TID WC  . insulin aspart  0-5 Units  Subcutaneous QHS  . levothyroxine  100 mcg Oral QAC breakfast  . mirtazapine  30 mg Oral QHS  . PARoxetine  20 mg Oral Daily  . polyethylene glycol  17 g Oral Daily  . potassium chloride  20 mEq Oral Daily  . pravastatin  40 mg Oral QHS  . salmeterol  1 puff Inhalation Q12H  . spironolactone  25 mg Oral Daily  . tamsulosin  0.4 mg Oral Daily  . tiotropium  18 mcg Inhalation Daily  . vitamin B-12  2,000 mcg Oral Daily  . warfarin  10 mg Oral Once per day on Mon Fri  . warfarin  5 mg Oral Once per day on Sun Tue Wed Thu Sat   acetaminophen **OR** acetaminophen, alum & mag hydroxide-simeth, ipratropium-albuterol, lactulose, morphine injection, ondansetron **OR** ondansetron (ZOFRAN) IV, sodium chloride  Assessment/ Plan:  59 y.o. male with CKD stage III baseline Cr 1.3, hypertension, morbid obesity, chronic diastolic heart failure, paroxysmal atrial fibrillation, sleep apnea, diabetes mellitus, chronic edema, now presents with increasing edema, shortness of breath, pericardial effusion, pleural effusion, ascites, anasarca.  1.  Acute renal failure on CKD stage III: Exposed to contrast upon admission 2.  Acute diastolic congestive heart failure with pulmonary hypertension:  3. Hyponatremia: improved to 134.   Has underlying heart failure with anasarca but had worsening renal function with diuresis.  In addition he has low oncotic pressure state from low albumin.   -Renal function slightly better in terms of Cr, - Continue IV furosemide /hour. Continue to monitor volume status and respiratory status.  - Low threshold for dialysis for ultrafiltration. Patient made aware - Renally dose all medication - Monitor urine output.    LOS: 5 Rasheeda Mulvehill 7/25/201610:28 AM

## 2015-04-15 NOTE — Care Management Important Message (Signed)
Important Message  Patient Details  Name: Tyler [name] MRN: [id number] Date of Birth: 1956/07/19   Medicare Important Message Given:  Yes-third notification given    Olegario Messier A Allmond 04/15/2015, 11:54 AM

## 2015-04-15 NOTE — Progress Notes (Signed)
Initial Nutrition Assessment    INTERVENTION:   Meals and Snacks: Cater to patient preferences   NUTRITION DIAGNOSIS:    (Unitentional weight gain) related to acute illness as evidenced by  (40 pound weight gain in 2 months, edema, ascites, SOB with hx of CHF).   GOAL:   Patient will meet greater than or equal to 90% of their needs  MONITOR:    (Energy Intake, Anthropometrics, Digestive System, Electrolyte/Renal Profile, Glucose Profile, Knowledge)  REASON FOR ASSESSMENT:   LOS    ASSESSMENT:    Pt admitted with SOB, ascites, edema; acute on CKD, acute CHF with pulmonary HTN, hyponatremia  Past Medical History  Diagnosis Date  . Cataract   . CHF (congestive heart failure)   . COPD (chronic obstructive pulmonary disease)   . Diabetes mellitus without complication   . Sleep apnea   . Oxygen deficiency   . Thyroid disease   . Gout   . Hypothyroidism   . CKD (chronic kidney disease) stage 3, GFR 30-59 ml/min   . Asthma   . Hypertension      Diet Order:  Diet heart healthy/carb modified Room service appropriate?: Yes; Fluid consistency:: Thin   Energy  Intake: recorded po intake 100% of meals  Skin:  Reviewed, no issues  Last BM:  7/24   Nutrition Focused Physical Exam: Nutrition-Focused physical exam completed. Findings are WDL for fat depletion and muscle depletion with moderate edema    Height:   Ht Readings from Last 1 Encounters:  04/09/15 6' (1.829 m)    Weight: 40 pound weight gain in 2 months  Wt Readings from Last 1 Encounters:  04/15/15 359 lb 9.6 oz (163.113 kg)    Filed Weights   04/13/15 0503 04/14/15 0601 04/15/15 0458  Weight: 362 lb 6.4 oz (164.384 kg) 360 lb 1.6 oz (163.34 kg) 359 lb 9.6 oz (163.113 kg)     Wt Readings from Last 10 Encounters:  04/15/15 359 lb 9.6 oz (163.113 kg)  03/26/15 357 lb (161.934 kg)  03/12/15 337 lb (152.862 kg)  02/18/15 345 lb 14.4 oz (156.9 kg)    BMI:  Body mass index is 48.76  kg/(m^2).  Estimated Nutritional Needs:   Kcal:  1191-4782 kcals (BEE 1658, 1.2 AF, 1.0-1.2 IF)   Protein:  81-97 g (1.0-1.2 g/kg)   Fluid:  1620-2025 mL (20-25 ml/kg)   MODERATE Care Level  Romelle Starcher MS, RD, LDN [phone] Pager

## 2015-04-15 NOTE — Progress Notes (Signed)
Physical Therapy Treatment Patient Details Name: Tyler [name] MRN: [id number] DOB: October 16, 1955 Today's Date: 04/15/2015    History of Present Illness Tyler [name] is a 59 y.o. male with a known history of COPD, chronic respiratory failure 2.5 L nasal Baseline, type 2 diabetes mellitus requiring, moderate pulmonary hypertension presenting with abdominal pain.    PT Comments    Pt tolerating treatment session well, motivated and able to complete entire PT sesssion as planned. Pt struggling to make progress toward goals as evidenced by slight shorter ambulation distance tolerated, as well as additional O2 needed to maintain normal safe O2 sats. Pt's greatest limitation continues to be oxygen perfusion which continues to limit ability to perform most transfers and ambulation at baseline function. Patient presenting with impairment of strength, pain, balance, and activity tolerance, limiting ability to perform ADL and mobility tasks at  baseline level of function. Patient will benefit from skilled intervention to address the above impairments and limitations, in order to restore to prior level of function, improve patient safety upon discharge, and to decrease caregiver burden.    Follow Up Recommendations  Home health PT     Equipment Recommendations  Other (comment) (wheel kit for rolling walker )    Recommendations for Other Services       Precautions / Restrictions Precautions Precautions: None Restrictions Weight Bearing Restrictions: No    Mobility  Bed Mobility Overal bed mobility: Modified Independent                Transfers Overall transfer level: Modified independent Equipment used: Rolling walker (2 wheeled) Transfers: Sit to/from Stand Sit to Stand: Modified independent (Device/Increase time)            Ambulation/Gait Ambulation/Gait assistance: Min guard Ambulation Distance (Feet): 200 Feet (2 sitting breaks ) Assistive device: Rolling  walker (2 wheeled)     Gait velocity interpretation: <1.8 ft/sec, indicative of risk for recurrent falls General Gait Details: Pt appears stable on feet, but pt expresses some concern abotu over-doing it for fear of hip giving out. Pt requires 6L O2 to perform AMB this session, and is able to walk less distance, requring more breaks.    Stairs            Wheelchair Mobility    Modified Rankin (Stroke Patients Only)       Balance Overall balance assessment: Needs assistance Sitting-balance support: Feet supported;No upper extremity supported Sitting balance-Leahy Scale: Good     Standing balance support: Bilateral upper extremity supported Standing balance-Leahy Scale: Fair                      Cognition Arousal/Alertness: Lethargic;Suspect due to medications Behavior During Therapy: [facility] for tasks assessed/performed Overall Cognitive Status: Within Functional Limits for tasks assessed                      Exercises      General Comments        Pertinent Vitals/Pain Pain Assessment: No/denies pain Pain Location: Denies upon entry; reports LBP p ambulating in hallway Pain Intervention(s): Limited activity within patient's tolerance    Home Living                      Prior Function            PT Goals (current goals can now be found in the care plan section) Acute Rehab PT Goals Patient Stated Goal: "I think I'm doing  alright." Pt wants to improve strength and mobility PT Goal Formulation: With patient Time For Goal Achievement: 04/24/15 Potential to Achieve Goals: Good Progress towards PT goals: PT to reassess next treatment (Additional O2 needs, with limited AMB distance. )    Frequency  Min 2X/week    PT Plan Current plan remains appropriate    Co-evaluation             End of Session Equipment Utilized During Treatment: Gait belt;Oxygen Activity Tolerance: Patient limited by fatigue;Patient limited by  pain Patient left: in bed (seat EOB. )     Time: 1115-1130 PT Time Calculation (min) (ACUTE ONLY): 15 min  Charges:  $Therapeutic Activity: 8-22 mins                    G Codes:      Buccola,Allan C Apr 28, 2015, 12:18 PM  12:21 PM  Etta Grandchild, PT, DPT [place] License # [id number]

## 2015-04-15 NOTE — Progress Notes (Signed)
Patient on lasix drip ,  VSS, no complaint of pain or discomfort, will continue to monitor.

## 2015-04-15 NOTE — Progress Notes (Signed)
Tyler [name] is a 59 y.o. male  Anasarca   SUBJECTIVE:  Pt resting in NAD. Remains on O2. Renal fxn improved today. Nephrology input appreciated.  ______________________________________________________________________  ROS: Review of systems is unremarkable for any active cardiac,respiratory, GI, GU, hematologic, neurologic or psychiatric systems, 10 systems reviewed.  @  Past Medical History  Diagnosis Date  . Cataract   . CHF (congestive heart failure)   . COPD (chronic obstructive pulmonary disease)   . Diabetes mellitus without complication   . Sleep apnea   . Oxygen deficiency   . Thyroid disease   . Gout   . Hypothyroidism   . CKD (chronic kidney disease) stage 3, GFR 30-59 ml/min   . Asthma   . Hypertension     Past Surgical History  Procedure Laterality Date  . Cholecystectomy      2002  . Eye surgery      right cataract removed 3/21  . Vena cava filter placement      PHYSICAL EXAM:  BP 92/54 mmHg  Pulse 63  Temp(Src) 97.9 F (36.6 C) (Oral)  Resp 22  Ht 6' (1.829 m)  Wt 163.113 kg (359 lb 9.6 oz)  BMI 48.76 kg/m2  SpO2 96%  Wt Readings from Last 3 Encounters:  04/15/15 163.113 kg (359 lb 9.6 oz)  03/26/15 161.934 kg (357 lb)  03/12/15 152.862 kg (337 lb)            Constitutional: NAD Neck: supple, no thyromegaly Respiratory:basilar rales with scattereded rhonchi. No wheezes Cardiovascular: RRR, no murmur, no gallop Abdomen: distended, good BS, nontender Extremities: 3+ edema Neuro: alert and oriented, no focal motor or sensory deficits  ASSESSMENT/PLAN:  Labs and imaging studies were reviewed  Will retry Lasix drip today per Nephrology. Awaiting f/u from Cardiology. Repeat labs in AM.

## 2015-04-16 LAB — GLUCOSE, CAPILLARY
GLUCOSE-CAPILLARY: 144 mg/dL — AB (ref 65–99)
GLUCOSE-CAPILLARY: 146 mg/dL — AB (ref 65–99)
Glucose-Capillary: 157 mg/dL — ABNORMAL HIGH (ref 65–99)
Glucose-Capillary: 178 mg/dL — ABNORMAL HIGH (ref 65–99)

## 2015-04-16 LAB — COMPREHENSIVE METABOLIC PANEL
ALK PHOS: 177 U/L — AB (ref 38–126)
ALT: 7 U/L — ABNORMAL LOW (ref 17–63)
AST: 20 U/L (ref 15–41)
Albumin: 2.8 g/dL — ABNORMAL LOW (ref 3.5–5.0)
Anion gap: 10 (ref 5–15)
BUN: 34 mg/dL — ABNORMAL HIGH (ref 6–20)
CALCIUM: 8.5 mg/dL — AB (ref 8.9–10.3)
CHLORIDE: 96 mmol/L — AB (ref 101–111)
CO2: 29 mmol/L (ref 22–32)
Creatinine, Ser: 1.67 mg/dL — ABNORMAL HIGH (ref 0.61–1.24)
GFR calc Af Amer: 50 mL/min — ABNORMAL LOW (ref >60)
GFR calc non Af Amer: 43 mL/min — ABNORMAL LOW (ref >60)
GLUCOSE: 140 mg/dL — AB (ref 65–99)
POTASSIUM: 3.8 mmol/L (ref 3.5–5.1)
Sodium: 135 mmol/L (ref 135–145)
Total Bilirubin: 0.7 mg/dL (ref 0.3–1.2)
Total Protein: 6.2 g/dL — ABNORMAL LOW (ref 6.5–8.1)

## 2015-04-16 LAB — PROTIME-INR
INR: 2.58
PROTHROMBIN TIME: 27.8 s — AB (ref 11.4–15.0)

## 2015-04-16 LAB — CBC WITH DIFFERENTIAL/PLATELET
BASOS PCT: 1 %
Basophils Absolute: 0.1 10*3/uL (ref 0–0.1)
Eosinophils Absolute: 0.2 10*3/uL (ref 0–0.7)
Eosinophils Relative: 4 %
HCT: 30.4 % — ABNORMAL LOW (ref 40.0–52.0)
HEMOGLOBIN: 10.1 g/dL — AB (ref 13.0–18.0)
Lymphocytes Relative: 20 %
Lymphs Abs: 1 10*3/uL (ref 1.0–3.6)
MCH: 27.7 pg (ref 26.0–34.0)
MCHC: 33.3 g/dL (ref 32.0–36.0)
MCV: 83.1 fL (ref 80.0–100.0)
Monocytes Absolute: 0.6 10*3/uL (ref 0.2–1.0)
Monocytes Relative: 13 %
NEUTROS ABS: 2.9 10*3/uL (ref 1.4–6.5)
Neutrophils Relative %: 62 %
Platelets: 194 10*3/uL (ref 150–440)
RBC: 3.66 MIL/uL — ABNORMAL LOW (ref 4.40–5.90)
RDW: 16.6 % — ABNORMAL HIGH (ref 11.5–14.5)
WBC: 4.8 10*3/uL (ref 3.8–10.6)

## 2015-04-16 NOTE — Progress Notes (Signed)
[facility]  ROUNDING NOTE   Subjective:  Patient sitting at side of bed.  UOP 2500 Furosemide gtt 4mg  /hour Continues to complain of edema, ascites and shortness of breath.   Creatinine 1.67 (2.16) (2.56) Objective:  Vital signs in last 24 hours:  Temp:  [98 F (36.7 C)-98.3 F (36.8 C)] 98.3 F (36.8 C) (07/26 0501) Pulse Rate:  [71-74] 71 (07/26 0909) Resp:  [18-21] 19 (07/26 0501) BP: (119-140)/(65-79) 129/79 mmHg (07/26 0909) SpO2:  [89 %-94 %] 94 % (07/26 0909) Weight:  [161.344 kg (355 lb 11.2 oz)] 161.344 kg (355 lb 11.2 oz) (07/26 0501)  Weight change: -1.769 kg (-3 lb 14.4 oz) Filed Weights   04/14/15 0601 04/15/15 0458 04/16/15 0501  Weight: 163.34 kg (360 lb 1.6 oz) 163.113 kg (359 lb 9.6 oz) 161.344 kg (355 lb 11.2 oz)    Intake/Output: I/O last 3 completed shifts: In: 600 [P.O.:600] Out: 2950 [Urine:2950]   Intake/Output this shift:  Total I/O In: 98.7 [I.V.:98.7] Out: 275 [Urine:275]  Physical Exam: General: NAD, obese, sitting  Head: Normocephalic, atraumatic. Moist oral mucosal membranes  Eyes: Anicteric  Neck: Supple, trachea midline  Lungs:  Clear, bilateral basilar wheezes  Heart: Regular rate and rhythm S1S2 no rubs  Abdomen:  Soft, nontender, obese, BS present, abdominal wall edema  Extremities:  1+ peripheral edema, tight,   Neurologic: Nonfocal, moving all four extremities  Skin: No lesions  Access: none    Basic Metabolic Panel:  Recent Labs Lab 04/12/15 0504 04/13/15 0447 04/14/15 0434 04/15/15 0435 04/16/15 0521  NA 131* 130* 131* 134* 135  K 3.9 4.7 4.4 3.7 3.8  CL 89* 90* 92* 95* 96*  CO2 30 28 30 28 29   GLUCOSE 77 138* 157* 155* 140*  BUN 35* 37* 39* 37* 34*  CREATININE 2.81* 2.83* 2.56* 2.16* 1.67*  CALCIUM 8.6* 8.2* 8.3* 8.4* 8.5*    Liver Function Tests:  Recent Labs Lab 04/09/15 1906 04/11/15 0429 04/12/15 0504 04/13/15 0447 04/16/15 0521  AST 22 22 22 19 20   ALT 9* 9* 8* 8* 7*  ALKPHOS  228* 211* 180* 178* 177*  BILITOT 0.9 0.6 0.6 0.8 0.7  PROT 6.9 6.4* 6.6 5.9* 6.2*  ALBUMIN 2.8* 2.7* 3.1* 2.8* 2.8*    Recent Labs Lab 04/09/15 1906  LIPASE 19*   No results for input(s): AMMONIA in the last 168 hours.  CBC:  Recent Labs Lab 04/11/15 0429 04/12/15 0504 04/13/15 0447 04/14/15 0434 04/16/15 0521  WBC 6.5 6.2 5.4 5.1 4.8  NEUTROABS 4.6 4.8 3.9  --  2.9  HGB 11.2* 10.2* 9.9* 10.1* 10.1*  HCT 33.7* 31.6* 30.0* 30.6* 30.4*  MCV 82.3 82.5 83.0 83.0 83.1  PLT 226 197 187 183 194    Cardiac Enzymes:  Recent Labs Lab 04/10/15 0120 04/10/15 0700 04/10/15 1256  TROPONINI 0.03 <0.03 <0.03    BNP: Invalid input(s): POCBNP  CBG:  Recent Labs Lab 04/15/15 0835 04/15/15 1203 04/15/15 1604 04/15/15 2042 04/16/15 0721  GLUCAP 186* 185* 167* 158* 157*    Microbiology: Results for orders placed or performed in visit on 08/21/14  ED Influenza     Status: None   Collection Time: 08/21/14  4:53 PM  Result Value Ref Range Status   Influenza A By PCR NEGATIVE NEGATIVE Final   Influenza B By PCR NEGATIVE NEGATIVE Final   H1N1 flu by pcr NOT DETECTED NOT-DETECTED Final    Comment:                  -----------------------  The Xpert Flu assay (FDA approval for nasal aspirates or washes and nasopharyngeal swab specimens), is intended as an aid in the diagnosis of influenza and should not be used as a sole basis for treatment.     Coagulation Studies:  Recent Labs  04/15/15 1200 04/16/15 0521  LABPROT 26.4* 27.8*  INR 2.41 2.58    Urinalysis: No results for input(s): COLORURINE, LABSPEC, PHURINE, GLUCOSEU, HGBUR, BILIRUBINUR, KETONESUR, PROTEINUR, UROBILINOGEN, NITRITE, LEUKOCYTESUR in the last 72 hours.  Invalid input(s): APPERANCEUR    Imaging: No results found.   Medications:   . furosemide (LASIX) infusion 4 mg/hr (04/16/15 0609)   . cyanocobalamin  1,000 mcg Intramuscular Q30 days  . gabapentin  100 mg Oral BID  . insulin aspart   0-15 Units Subcutaneous TID WC  . insulin aspart  0-5 Units Subcutaneous QHS  . levothyroxine  100 mcg Oral QAC breakfast  . mirtazapine  30 mg Oral QHS  . PARoxetine  20 mg Oral Daily  . polyethylene glycol  17 g Oral Daily  . potassium chloride  20 mEq Oral Daily  . pravastatin  40 mg Oral QHS  . salmeterol  1 puff Inhalation Q12H  . spironolactone  25 mg Oral Daily  . tamsulosin  0.4 mg Oral Daily  . tiotropium  18 mcg Inhalation Daily  . vitamin B-12  2,000 mcg Oral Daily  . warfarin  10 mg Oral Once per day on Mon Fri  . warfarin  5 mg Oral Once per day on Sun Tue Wed Thu Sat   acetaminophen **OR** acetaminophen, alum & mag hydroxide-simeth, ipratropium-albuterol, lactulose, morphine injection, ondansetron **OR** ondansetron (ZOFRAN) IV, sodium chloride  Assessment/ Plan:  59 y.o. male with CKD stage III baseline Cr 1.3, hypertension, morbid obesity, chronic diastolic heart failure, paroxysmal atrial fibrillation, sleep apnea, diabetes mellitus, chronic edema, now presents with increasing edema, shortness of breath, pericardial effusion, pleural effusion, ascites, anasarca.  1.  Acute renal failure on CKD stage III: Exposed to contrast upon admission 2.  Acute diastolic congestive heart failure with pulmonary hypertension:  3. Hyponatremia: improved to 135 4. Hypokalemia: on potassium and spironlactone.   Has underlying heart failure with anasarca but had worsening renal function with diuresis.  In addition he has low oncotic pressure state from low albumin.   - Renal function better wth good urine output. - Monitor urine output.  - Continue IV furosemide /hour. Continue to monitor volume status and respiratory status.  - Low threshold for dialysis for ultrafiltration. Patient made aware - Renally dose all medication   LOS: 6 Jillianna Stanek 7/26/20169:35 AM

## 2015-04-16 NOTE — Progress Notes (Signed)
Subjective:   patient states he feels better reduce shortness of breath slightly improved edema he just woke up had a good sleep denies any chest pain.  Objective:  Vital Signs in the last 24 hours: Temp:  [98 F (36.7 C)-98.3 F (36.8 C)] 98.3 F (36.8 C) (07/26 0501) Pulse Rate:  [71-74] 71 (07/26 0909) Resp:  [18-21] 19 (07/26 0501) BP: (119-140)/(65-79) 129/79 mmHg (07/26 0909) SpO2:  [89 %-94 %] 94 % (07/26 0909) Weight:  [161.344 kg (355 lb 11.2 oz)] 161.344 kg (355 lb 11.2 oz) (07/26 0501)  Intake/Output from previous day: 07/25 0701 - 07/26 0700 In: 600 [P.O.:600] Out: 2500 [Urine:2500] Intake/Output from this shift: Total I/O In: 218.7 [P.O.:120; I.V.:98.7] Out: 275 [Urine:275]  Physical Exam: General appearance: alert, cooperative and appears stated age Neck: no adenopathy, no carotid bruit, no JVD, supple, symmetrical, trachea midline and thyroid not enlarged, symmetric, no tenderness/mass/nodules Lungs: diminished breath sounds bibasilar and bilaterally Heart: no S3 or S4 and systolic murmur: systolic ejection 2/6, crescendo at 2nd left intercostal space Abdomen: soft, non-tender; bowel sounds normal; no masses,  no organomegaly Extremities: extremities normal, atraumatic, no cyanosis or edema Pulses: 2+ and symmetric Skin: Skin color, texture, turgor normal. No rashes or lesions Neurologic: Alert and oriented X 3, normal strength and tone. Normal symmetric reflexes. Normal coordination and gait   3 to 4+ leg edema bilaterally  Lab Results:  Recent Labs  04/14/15 0434 04/16/15 0521  WBC 5.1 4.8  HGB 10.1* 10.1*  PLT 183 194    Recent Labs  04/15/15 0435 04/16/15 0521  NA 134* 135  K 3.7 3.8  CL 95* 96*  CO2 28 29  GLUCOSE 155* 140*  BUN 37* 34*  CREATININE 2.16* 1.67*   No results for input(s): TROPONINI in the last 72 hours.  Invalid input(s): CK, MB Hepatic Function Panel  Recent Labs  04/16/15 0521  PROT 6.2*  ALBUMIN 2.8*  AST 20   ALT 7*  ALKPHOS 177*  BILITOT 0.7   No results for input(s): CHOL in the last 72 hours. No results for input(s): PROTIME in the last 72 hours.  Imaging: Imaging results have been reviewed  Cardiac Studies:  Assessment/Plan:  CHF Edema Shortness of Breath   hypertension  obstructive sleep apnea  diabetes  asthma  atrial fibrillation  smoking Anasarca  depression  chronic respiratory failure  noncompliance . PLAN  with continued telemetry in patient care  continue continuous Lasix drip  continue treatment for heart failure  continue insulin therapy for diabetes  agree with blood pressure control with spironolactone  maintain Coumadin for anticoagulation for AFib pain history DVT  Pravachol therapy for hyperlipidemia  recommend sleep study weight loss for obstructive sleep apnea symptoms  consider support stockings for chronic leg edema  I do not recommend cardiac catheterization at this point  continue supplemental oxygen therapy  LOS: 6 days    Antrice Pal D. 04/16/2015, 10:19 AM

## 2015-04-16 NOTE — Consult Note (Signed)
[facility] Consult   Reason for Consult:  Follow-up for patientwith depression and multiple  Medical problems Referring Physician:  [name] Patient Identification: Tyler [name] MRN:  [id number] Principal Diagnosis: Anasarca Diagnosis:   Patient Active Problem List   Diagnosis Date Noted  . Depression, major, recurrent, moderate [F33.1] 04/12/2015  . CHF (congestive heart failure) [I50.9] 04/10/2015  . Anasarca [R60.1] 04/09/2015  . COPD, moderate [J44.9] 03/12/2015  . Tobacco abuse [Z72.0] 03/12/2015  . Acute CHF [I50.9] 02/16/2015  . Hyponatremia [E87.1] 02/16/2015  . A-fib [I48.91] 02/16/2015  . Chronic respiratory failure [J96.10] 02/16/2015    Total Time spent with patient: 30 minutes  Subjective:   Tyler [name] is a 59 y.o. male patient admitted with depression and anasarca.  HPI:  Feeling better. Less depressed. More optimistic. No current suicidal thought. HPI Elements:   Quality:  depression. Severity:  severe. Timing:  improving2. Duration:  chronic. Context:  medical illness.  Past Medical History:  Past Medical History  Diagnosis Date  . Cataract   . CHF (congestive heart failure)   . COPD (chronic obstructive pulmonary disease)   . Diabetes mellitus without complication   . Sleep apnea   . Oxygen deficiency   . Thyroid disease   . Gout   . Hypothyroidism   . CKD (chronic kidney disease) stage 3, GFR 30-59 ml/min   . Asthma   . Hypertension     Past Surgical History  Procedure Laterality Date  . Cholecystectomy      2002  . Eye surgery      right cataract removed 3/21  . Vena cava filter placement     Family History:  Family History  Problem Relation Age of Onset  . Diabetes Sister   . Diabetes Brother    Social History:  History  Alcohol Use No     History  Drug Use No    History   Social History  . Marital Status: Married    Spouse Name: N/A  . Number of Children: N/A  . Years of Education: N/A    Social History Main Topics  . Smoking status: Current Every Day Smoker -- 2.00 packs/day for 30 years    Types: Cigarettes, Cigars  . Smokeless tobacco: Never Used     Comment: cigar 3-4 yrs 1-3 per day/former Cigarettes  . Alcohol Use: No  . Drug Use: No  . Sexual Activity: Not on file   Other Topics Concern  . None   Social History Narrative   Additional Social History:                          Allergies:   Allergies  Allergen Reactions  . Enalapril Cough  . Lovastatin Other (See Comments)    tachycardia  . Atorvastatin Palpitations    Labs:  Results for orders placed or performed during the hospital encounter of 04/09/15 (from the past 48 hour(s))  Basic metabolic panel     Status: Abnormal   Collection Time: 04/15/15  4:35 AM  Result Value Ref Range   Sodium 134 (L) 135 - 145 mmol/L   Potassium 3.7 3.5 - 5.1 mmol/L   Chloride 95 (L) 101 - 111 mmol/L   CO2 28 22 - 32 mmol/L   Glucose, Bld 155 (H) 65 - 99 mg/dL   BUN 37 (H) 6 - 20 mg/dL   Creatinine, Ser 2.16 (H) 0.61 - 1.24 mg/dL   Calcium 8.4 (L) 8.9 -  10.3 mg/dL   GFR calc non Af Amer 32 (L) >60 mL/min   GFR calc Af Amer 37 (L) >60 mL/min    Comment: (NOTE) The eGFR has been calculated using the CKD EPI equation. This calculation has not been validated in all clinical situations. eGFR's persistently <60 mL/min signify possible Chronic Kidney Disease.    Anion gap 11 5 - 15  Glucose, capillary     Status: Abnormal   Collection Time: 04/15/15  8:35 AM  Result Value Ref Range   Glucose-Capillary 186 (H) 65 - 99 mg/dL  Protime-INR     Status: Abnormal   Collection Time: 04/15/15 12:00 PM  Result Value Ref Range   Prothrombin Time 26.4 (H) 11.4 - 15.0 seconds   INR 2.41   Glucose, capillary     Status: Abnormal   Collection Time: 04/15/15 12:03 PM  Result Value Ref Range   Glucose-Capillary 185 (H) 65 - 99 mg/dL  Glucose, capillary     Status: Abnormal   Collection Time: 04/15/15  4:04 PM   Result Value Ref Range   Glucose-Capillary 167 (H) 65 - 99 mg/dL  Glucose, capillary     Status: Abnormal   Collection Time: 04/15/15  8:42 PM  Result Value Ref Range   Glucose-Capillary 158 (H) 65 - 99 mg/dL  CBC with Differential/Platelet     Status: Abnormal   Collection Time: 04/16/15  5:21 AM  Result Value Ref Range   WBC 4.8 3.8 - 10.6 K/uL   RBC 3.66 (L) 4.40 - 5.90 MIL/uL   Hemoglobin 10.1 (L) 13.0 - 18.0 g/dL   HCT 30.4 (L) 40.0 - 52.0 %   MCV 83.1 80.0 - 100.0 fL   MCH 27.7 26.0 - 34.0 pg   MCHC 33.3 32.0 - 36.0 g/dL   RDW 16.6 (H) 11.5 - 14.5 %   Platelets 194 150 - 440 K/uL   Neutrophils Relative % 62 %   Neutro Abs 2.9 1.4 - 6.5 K/uL   Lymphocytes Relative 20 %   Lymphs Abs 1.0 1.0 - 3.6 K/uL   Monocytes Relative 13 %   Monocytes Absolute 0.6 0.2 - 1.0 K/uL   Eosinophils Relative 4 %   Eosinophils Absolute 0.2 0 - 0.7 K/uL   Basophils Relative 1 %   Basophils Absolute 0.1 0 - 0.1 K/uL  Comprehensive metabolic panel     Status: Abnormal   Collection Time: 04/16/15  5:21 AM  Result Value Ref Range   Sodium 135 135 - 145 mmol/L   Potassium 3.8 3.5 - 5.1 mmol/L   Chloride 96 (L) 101 - 111 mmol/L   CO2 29 22 - 32 mmol/L   Glucose, Bld 140 (H) 65 - 99 mg/dL   BUN 34 (H) 6 - 20 mg/dL   Creatinine, Ser 1.67 (H) 0.61 - 1.24 mg/dL   Calcium 8.5 (L) 8.9 - 10.3 mg/dL   Total Protein 6.2 (L) 6.5 - 8.1 g/dL   Albumin 2.8 (L) 3.5 - 5.0 g/dL   AST 20 15 - 41 U/L   ALT 7 (L) 17 - 63 U/L   Alkaline Phosphatase 177 (H) 38 - 126 U/L   Total Bilirubin 0.7 0.3 - 1.2 mg/dL   GFR calc non Af Amer 43 (L) >60 mL/min   GFR calc Af Amer 50 (L) >60 mL/min    Comment: (NOTE) The eGFR has been calculated using the CKD EPI equation. This calculation has not been validated in all clinical situations. eGFR's persistently <60 mL/min signify  possible Chronic Kidney Disease.    Anion gap 10 5 - 15  Protime-INR     Status: Abnormal   Collection Time: 04/16/15  5:21 AM  Result Value  Ref Range   Prothrombin Time 27.8 (H) 11.4 - 15.0 seconds   INR 2.58   Glucose, capillary     Status: Abnormal   Collection Time: 04/16/15  7:21 AM  Result Value Ref Range   Glucose-Capillary 157 (H) 65 - 99 mg/dL  Glucose, capillary     Status: Abnormal   Collection Time: 04/16/15 10:59 AM  Result Value Ref Range   Glucose-Capillary 178 (H) 65 - 99 mg/dL  Glucose, capillary     Status: Abnormal   Collection Time: 04/16/15  4:05 PM  Result Value Ref Range   Glucose-Capillary 144 (H) 65 - 99 mg/dL  Glucose, capillary     Status: Abnormal   Collection Time: 04/16/15  7:53 PM  Result Value Ref Range   Glucose-Capillary 146 (H) 65 - 99 mg/dL    Vitals: Blood pressure 106/45, pulse 68, temperature 98 F (36.7 C), temperature source Oral, resp. rate 20, height 6' (1.829 m), weight 161.344 kg (355 lb 11.2 oz), SpO2 97 %.  Risk to Self: Is patient at risk for suicide?: No Risk to Others:   Prior Inpatient Therapy:   Prior Outpatient Therapy:    Current Facility-Administered Medications  Medication Dose Route Frequency Provider Last Rate Last Dose  . acetaminophen (TYLENOL) tablet 650 mg  650 mg Oral Q6H PRN Lytle Butte, MD   650 mg at 04/16/15 0251   Or  . acetaminophen (TYLENOL) suppository 650 mg  650 mg Rectal Q6H PRN Lytle Butte, MD      . alum & mag hydroxide-simeth (MAALOX/MYLANTA) 200-200-20 MG/5ML suspension 30 mL  30 mL Oral Q6H PRN Nicholes Mango, MD      . cyanocobalamin ((VITAMIN B-12)) injection 1,000 mcg  1,000 mcg Intramuscular Q30 days Lytle Butte, MD   1,000 mcg at 04/10/15 0105  . furosemide (LASIX) 250 mg in dextrose 5 % 250 mL (1 mg/mL) infusion  4 mg/hr Intravenous Continuous Idelle Crouch, MD 4 mL/hr at 04/16/15 6789 4 mg/hr at 04/16/15 0609  . gabapentin (NEURONTIN) capsule 100 mg  100 mg Oral BID Lytle Butte, MD   100 mg at 04/16/15 2034  . insulin aspart (novoLOG) injection 0-15 Units  0-15 Units Subcutaneous TID WC Lytle Butte, MD   2 Units at  04/16/15 1643  . insulin aspart (novoLOG) injection 0-5 Units  0-5 Units Subcutaneous QHS Lytle Butte, MD   2 Units at 04/10/15 2115  . ipratropium-albuterol (DUONEB) 0.5-2.5 (3) MG/3ML nebulizer solution 3 mL  3 mL Nebulization Q4H PRN Idelle Crouch, MD      . lactulose (CHRONULAC) 10 GM/15ML solution 30 g  30 g Oral BID PRN Lytle Butte, MD   30 g at 04/13/15 2121  . levothyroxine (SYNTHROID, LEVOTHROID) tablet 100 mcg  100 mcg Oral QAC breakfast Lytle Butte, MD   100 mcg at 04/16/15 1022  . mirtazapine (REMERON) tablet 30 mg  30 mg Oral QHS Gonzella Lex, MD   30 mg at 04/16/15 2034  . morphine 2 MG/ML injection 2 mg  2 mg Intravenous Q4H PRN Lytle Butte, MD   2 mg at 04/14/15 2101  . ondansetron (ZOFRAN) tablet 4 mg  4 mg Oral Q6H PRN Lytle Butte, MD       Or  .  ondansetron (ZOFRAN) injection 4 mg  4 mg Intravenous Q6H PRN Lytle Butte, MD      . PARoxetine (PAXIL) tablet 20 mg  20 mg Oral Daily Lytle Butte, MD   20 mg at 04/16/15 0912  . polyethylene glycol (MIRALAX / GLYCOLAX) packet 17 g  17 g Oral Daily Lytle Butte, MD   17 g at 04/16/15 0912  . potassium chloride (KLOR-CON) packet 20 mEq  20 mEq Oral Daily Idelle Crouch, MD   20 mEq at 04/16/15 0912  . pravastatin (PRAVACHOL) tablet 40 mg  40 mg Oral QHS Lytle Butte, MD   40 mg at 04/16/15 2034  . salmeterol (SEREVENT) diskus inhaler 1 puff  1 puff Inhalation Q12H Idelle Crouch, MD   1 puff at 04/16/15 2034  . sodium chloride 0.9 % injection 3 mL  3 mL Intravenous PRN Idelle Crouch, MD   3 mL at 04/15/15 0813  . spironolactone (ALDACTONE) tablet 25 mg  25 mg Oral Daily Lytle Butte, MD   25 mg at 04/16/15 0912  . tamsulosin (FLOMAX) capsule 0.4 mg  0.4 mg Oral Daily Idelle Crouch, MD   0.4 mg at 04/16/15 0912  . tiotropium (SPIRIVA) inhalation capsule 18 mcg  18 mcg Inhalation Daily Idelle Crouch, MD   18 mcg at 04/16/15 0912  . vitamin B-12 (CYANOCOBALAMIN) tablet 2,000 mcg  2,000 mcg Oral Daily Lytle Butte, MD   2,000 mcg at 04/16/15 0912  . warfarin (COUMADIN) tablet 10 mg  10 mg Oral Once per day on Mon Fri Lytle Butte, MD   10 mg at 04/15/15 1712  . warfarin (COUMADIN) tablet 5 mg  5 mg Oral Once per day on Sun Tue Wed Thu Sat Idelle Crouch, MD   5 mg at 04/16/15 1643    Musculoskeletal: Strength & Muscle Tone: within normal limits Gait & Station: unsteady Patient leans: N/A  Psychiatric Specialty Exam: Physical Exam  Constitutional: Vital signs are normal. He appears well-nourished.  HENT:  Head: Normocephalic and atraumatic.  Eyes: Conjunctivae are normal. Pupils are equal, round, and reactive to light.  Neck: Normal range of motion.  Cardiovascular: Normal heart sounds.   Respiratory: Effort normal.  GI: Soft. He exhibits distension.  Musculoskeletal: Normal range of motion.  Neurological: He is alert.  Skin: Skin is warm and dry.  Psychiatric: He has a normal mood and affect. His speech is normal and behavior is normal. Judgment and thought content normal. Cognition and memory are normal.  Patient was up and active.reports that his mood is feeling better. Denies any hallucinations denies anyuicidal or homicidal ide Good insight and judgment    Review of Systems  Constitutional: Negative.   HENT: Negative.   Eyes: Negative.   Respiratory: Negative.   Cardiovascular: Negative.   Gastrointestinal: Negative.   Musculoskeletal: Negative.   Skin: Negative.   Neurological: Negative.   Psychiatric/Behavioral: Negative for depression, suicidal ideas, memory loss and substance abuse. The patient is not nervous/anxious and does not have insomnia.     Blood pressure 106/45, pulse 68, temperature 98 F (36.7 C), temperature source Oral, resp. rate 20, height 6' (1.829 m), weight 161.344 kg (355 lb 11.2 oz), SpO2 97 %.Body mass index is 48.23 kg/(m^2).  General Appearance: Casual  Eye Contact::  Good  Speech:  Clear and Coherent  Volume:  Normal  Mood:  Euthymic   Affect:  Congruent  Thought Process:  Coherent  Orientation:  Full (Time, Place, and Person)  Thought Content:  Negative  Suicidal Thoughts:  No  Homicidal Thoughts:  No  Memory:  Immediate;   Good Recent;   Good Remote;   Good  Judgement:  Intact  Insight:  Present  Psychomotor Activity:  Normal  Concentration:  Fair  Recall:  [employer] of Knowledge:Fair  Language: Fair  Akathisia:  No  Handed:  Right  AIMS (if indicated):     Assets:  Communication Skills Desire for Improvement Housing Resilience Social Support  ADL's:  Intact  Cognition: WNL  Sleep:      Medical Decision Making: Established Problem, Stable/Improving (1), Review of Psycho-Social Stressors (1) and Review of Medication Regimen & Side Effects (2)  Treatment Plan Summary: Medication management and Plan continue current medication and supportive follow up  Plan:  Supportive therapy provided about ongoing stressors. Disposition: no change to plan  Alethia Berthold 04/16/2015 9:50 PM

## 2015-04-16 NOTE — Clinical Social Work Note (Signed)
PT recommends HH, no facility placement needed.  CSW signing off unless further needs arise

## 2015-04-16 NOTE — Progress Notes (Signed)
Tyler [name] is a 59 y.o. male  Anasarca   SUBJECTIVE:  Pt in NAD. UOP improved now with Lasix drip. Renal fxn stable. Denies CP. Has chronic SOB/DOE. Afebrile.  ______________________________________________________________________  ROS: Review of systems is unremarkable for any active cardiac,respiratory, GI, GU, hematologic, neurologic or psychiatric systems, 10 systems reviewed.  @  Past Medical History  Diagnosis Date  . Cataract   . CHF (congestive heart failure)   . COPD (chronic obstructive pulmonary disease)   . Diabetes mellitus without complication   . Sleep apnea   . Oxygen deficiency   . Thyroid disease   . Gout   . Hypothyroidism   . CKD (chronic kidney disease) stage 3, GFR 30-59 ml/min   . Asthma   . Hypertension     Past Surgical History  Procedure Laterality Date  . Cholecystectomy      2002  . Eye surgery      right cataract removed 3/21  . Vena cava filter placement      PHYSICAL EXAM:  BP 119/72 mmHg  Pulse 74  Temp(Src) 98.3 F (36.8 C) (Oral)  Resp 19  Ht 6' (1.829 m)  Wt 161.344 kg (355 lb 11.2 oz)  BMI 48.23 kg/m2  SpO2 94%  Wt Readings from Last 3 Encounters:  04/16/15 161.344 kg (355 lb 11.2 oz)  03/26/15 161.934 kg (357 lb)  03/12/15 152.862 kg (337 lb)            Constitutional: NAD Neck: supple, no thyromegaly Respiratory: basilar rales, no wheezes Cardiovascular: RRR, no murmur, no gallop Abdomen: soft, good BS, nontender Extremities: 3+ edema Neuro: alert and oriented, no focal motor or sensory deficits  ASSESSMENT/PLAN:  Labs and imaging studies were reviewed  Will continue Lasix drip. Reorder echo. Await f/u from Cardiology and Nephrology. Repeat labs and CXR in AM. Continue PT. Hope to D/C to SNF in 2-3 days.

## 2015-04-16 NOTE — Progress Notes (Signed)
Physical Therapy Treatment Patient Details Name: Tyler [name] MRN: [id number] DOB: 1955-11-19 Today's Date: 04/16/2015    History of Present Illness Tyler [name] is a 59 y.o. male with a known history of COPD, chronic respiratory failure 2.5 L nasal Baseline, type 2 diabetes mellitus requiring, moderate pulmonary hypertension presenting with abdominal pain.    PT Comments    Pt was assessed for use of O2 at 2.5 L and noted start O2 sats 97% but after walking got as low as 89% with some fast recovery on O2.  Pt plans to use cpap at home now although had been claustrophobic about the use in the past.  Follow Up Recommendations  Home health PT     Equipment Recommendations  Other (comment) (wheels for RW at home)    Recommendations for Other Services       Precautions / Restrictions Precautions Precautions: None Restrictions Weight Bearing Restrictions: No    Mobility  Bed Mobility Overal bed mobility: Modified Independent                Transfers Overall transfer level: Modified independent Equipment used: Rolling walker (2 wheeled) Transfers: Sit to/from [employer] Sit to Stand: Supervision;Min guard Stand pivot transfers: Supervision;Min guard          Ambulation/Gait Ambulation/Gait assistance: Min guard Ambulation Distance (Feet): 300 Feet Assistive device: Rolling walker (2 wheeled) Gait Pattern/deviations: Step-through pattern;Shuffle;Wide base of support;Trunk flexed Gait velocity: reduced Gait velocity interpretation: Below normal speed for age/gender General Gait Details: pt is able to walk with more lateral deviation without AD but is better and back spasm controlled with RW.  He lives in a trailer and is worried he cannot getaround with such a device due to Careers adviser [place] (Stroke Patients Only)       Balance Overall balance assessment: Needs  assistance Sitting-balance support: Feet supported Sitting balance-Leahy Scale: Good       Standing balance-Leahy Scale: Fair                      Cognition Arousal/Alertness: Awake/alert Behavior During Therapy: WFL for tasks assessed/performed Overall Cognitive Status: Within Functional Limits for tasks assessed                      Exercises      General Comments        Pertinent Vitals/Pain Pain Assessment: 0-10 Pain Score: 6  Pain Intervention(s): Limited activity within patient's tolerance;Monitored during session    Home Living                      Prior Function            PT Goals (current goals can now be found in the care plan section) Acute Rehab PT Goals Patient Stated Goal: get better with gait and less back pain Progress towards PT goals: Progressing toward goals    Frequency  Min 2X/week    PT Plan Current plan remains appropriate    Co-evaluation             End of Session Equipment Utilized During Treatment: Gait belt;Oxygen Activity Tolerance: Patient limited by fatigue;Patient limited by pain Patient left: in chair;with call bell/phone within reach     Time: 1345-1428 PT Time Calculation (min) (ACUTE ONLY): 43 min  Charges:  $Gait Training: 8-22  mins $Therapeutic Activity: 23-37 mins                    G Codes:      Ivar Drape 04/27/15, 2:40 PM   Samul Dada, PT MS Acute Rehab Dept. Number: [facility] [id number] and [facility] [phone]

## 2015-04-16 NOTE — Care Management (Signed)
Patient again on Lasix drip.  There has been improvement in his his renal status.  Continue to hope that patient will accept home health services at discharge. Discussed possible benefit of palliative care consult for sx management.

## 2015-04-17 ENCOUNTER — Encounter: Payer: Self-pay | Admitting: *Deleted

## 2015-04-17 ENCOUNTER — Inpatient Hospital Stay: Payer: Commercial Managed Care - HMO

## 2015-04-17 LAB — CBC WITH DIFFERENTIAL/PLATELET
BASOS ABS: 0.1 10*3/uL (ref 0–0.1)
Basophils Relative: 2 %
Eosinophils Absolute: 0.2 10*3/uL (ref 0–0.7)
Eosinophils Relative: 3 %
HCT: 31.1 % — ABNORMAL LOW (ref 40.0–52.0)
Hemoglobin: 10.1 g/dL — ABNORMAL LOW (ref 13.0–18.0)
LYMPHS ABS: 0.9 10*3/uL — AB (ref 1.0–3.6)
LYMPHS PCT: 19 %
MCH: 27 pg (ref 26.0–34.0)
MCHC: 32.7 g/dL (ref 32.0–36.0)
MCV: 82.6 fL (ref 80.0–100.0)
Monocytes Absolute: 0.7 10*3/uL (ref 0.2–1.0)
Monocytes Relative: 14 %
Neutro Abs: 3.1 10*3/uL (ref 1.4–6.5)
Neutrophils Relative %: 62 %
Platelets: 200 10*3/uL (ref 150–440)
RBC: 3.76 MIL/uL — ABNORMAL LOW (ref 4.40–5.90)
RDW: 16.6 % — ABNORMAL HIGH (ref 11.5–14.5)
WBC: 4.9 10*3/uL (ref 3.8–10.6)

## 2015-04-17 LAB — PROTIME-INR
INR: 2.53
Prothrombin Time: 27.4 seconds — ABNORMAL HIGH (ref 11.4–15.0)

## 2015-04-17 LAB — GLUCOSE, CAPILLARY
GLUCOSE-CAPILLARY: 161 mg/dL — AB (ref 65–99)
Glucose-Capillary: 180 mg/dL — ABNORMAL HIGH (ref 65–99)
Glucose-Capillary: 180 mg/dL — ABNORMAL HIGH (ref 65–99)
Glucose-Capillary: 201 mg/dL — ABNORMAL HIGH (ref 65–99)

## 2015-04-17 LAB — BASIC METABOLIC PANEL
Anion gap: 9 (ref 5–15)
BUN: 31 mg/dL — ABNORMAL HIGH (ref 6–20)
CO2: 31 mmol/L (ref 22–32)
Calcium: 8.7 mg/dL — ABNORMAL LOW (ref 8.9–10.3)
Chloride: 97 mmol/L — ABNORMAL LOW (ref 101–111)
Creatinine, Ser: 1.7 mg/dL — ABNORMAL HIGH (ref 0.61–1.24)
GFR calc Af Amer: 49 mL/min — ABNORMAL LOW (ref >60)
GFR, EST NON AFRICAN AMERICAN: 42 mL/min — AB (ref >60)
GLUCOSE: 173 mg/dL — AB (ref 65–99)
Potassium: 4 mmol/L (ref 3.5–5.1)
Sodium: 137 mmol/L (ref 135–145)

## 2015-04-17 NOTE — Progress Notes (Signed)
[facility]  ROUNDING NOTE   Subjective:  Patient sitting in chair UOP 2200 (2500) Furosemide gtt 4mg  /hour Continues to complain of edema, ascites and shortness of breath.   Creatinine 1.7 (1.67) (2.16) (2.56) Objective:  Vital signs in last 24 hours:  Temp:  [97.8 F (36.6 C)-98.2 F (36.8 C)] 98.2 F (36.8 C) (07/27 0430) Pulse Rate:  [67-71] 67 (07/27 0430) Resp:  [20-21] 20 (07/27 0430) BP: (103-129)/(45-79) 103/51 mmHg (07/27 0430) SpO2:  [94 %-97 %] 95 % (07/27 0430) Weight:  [158.895 kg (350 lb 4.8 oz)] 158.895 kg (350 lb 4.8 oz) (07/27 0430)  Weight change: -2.449 kg (-5 lb 6.4 oz) Filed Weights   04/15/15 0458 04/16/15 0501 04/17/15 0430  Weight: 163.113 kg (359 lb 9.6 oz) 161.344 kg (355 lb 11.2 oz) 158.895 kg (350 lb 4.8 oz)    Intake/Output: I/O last 3 completed shifts: In: 698.7 [P.O.:600; I.V.:98.7] Out: 3050 [Urine:3050]   Intake/Output this shift:  Total I/O In: -  Out: 350 [Urine:350]  Physical Exam: General: NAD, obese, sitting  Head: Normocephalic, atraumatic. Moist oral mucosal membranes  Eyes: Anicteric  Neck: Supple, trachea midline  Lungs:  Clear, bilateral basilar wheezes  Heart: Regular rate and rhythm S1S2 no rubs  Abdomen:  Soft, nontender, obese, BS present, abdominal wall edema  Extremities:  1+ peripheral edema, tight,   Neurologic: Nonfocal, moving all four extremities  Skin: No lesions  Access: none    Basic Metabolic Panel:  Recent Labs Lab 04/13/15 0447 04/14/15 0434 04/15/15 0435 04/16/15 0521 04/17/15 0406  NA 130* 131* 134* 135 137  K 4.7 4.4 3.7 3.8 4.0  CL 90* 92* 95* 96* 97*  CO2 28 30 28 29 31   GLUCOSE 138* 157* 155* 140* 173*  BUN 37* 39* 37* 34* 31*  CREATININE 2.83* 2.56* 2.16* 1.67* 1.70*  CALCIUM 8.2* 8.3* 8.4* 8.5* 8.7*    Liver Function Tests:  Recent Labs Lab 04/11/15 0429 04/12/15 0504 04/13/15 0447 04/16/15 0521  AST 22 22 19 20   ALT 9* 8* 8* 7*  ALKPHOS 211* 180* 178*  177*  BILITOT 0.6 0.6 0.8 0.7  PROT 6.4* 6.6 5.9* 6.2*  ALBUMIN 2.7* 3.1* 2.8* 2.8*   No results for input(s): LIPASE, AMYLASE in the last 168 hours. No results for input(s): AMMONIA in the last 168 hours.  CBC:  Recent Labs Lab 04/11/15 0429 04/12/15 0504 04/13/15 0447 04/14/15 0434 04/16/15 0521 04/17/15 0406  WBC 6.5 6.2 5.4 5.1 4.8 4.9  NEUTROABS 4.6 4.8 3.9  --  2.9 3.1  HGB 11.2* 10.2* 9.9* 10.1* 10.1* 10.1*  HCT 33.7* 31.6* 30.0* 30.6* 30.4* 31.1*  MCV 82.3 82.5 83.0 83.0 83.1 82.6  PLT 226 197 187 183 194 200    Cardiac Enzymes:  Recent Labs Lab 04/10/15 1256  TROPONINI <0.03    BNP: Invalid input(s): POCBNP  CBG:  Recent Labs Lab 04/16/15 0721 04/16/15 1059 04/16/15 1605 04/16/15 1953 04/17/15 0732  GLUCAP 157* 178* 144* 146* 180*    Microbiology: Results for orders placed or performed in visit on 08/21/14  ED Influenza     Status: None   Collection Time: 08/21/14  4:53 PM  Result Value Ref Range Status   Influenza A By PCR NEGATIVE NEGATIVE Final   Influenza B By PCR NEGATIVE NEGATIVE Final   H1N1 flu by pcr NOT DETECTED NOT-DETECTED Final    Comment:                  -----------------------  The Xpert Flu assay (FDA approval for nasal aspirates or washes and nasopharyngeal swab specimens), is intended as an aid in the diagnosis of influenza and should not be used as a sole basis for treatment.     Coagulation Studies:  Recent Labs  04/15/15 1200 04/16/15 0521 04/17/15 0406  LABPROT 26.4* 27.8* 27.4*  INR 2.41 2.58 2.53    Urinalysis: No results for input(s): COLORURINE, LABSPEC, PHURINE, GLUCOSEU, HGBUR, BILIRUBINUR, KETONESUR, PROTEINUR, UROBILINOGEN, NITRITE, LEUKOCYTESUR in the last 72 hours.  Invalid input(s): APPERANCEUR    Imaging: Dg Chest 2 View  04/17/2015   CLINICAL DATA:  59 year old male with a history of COPD and chronic respiratory failure. Abdominal pain.  EXAM: CHEST - 2 VIEW  COMPARISON:  04/13/2015,  04/11/2015, 04/09/2015, CT chest 04/05/2015  FINDINGS: Cardiomediastinal silhouette unchanged. Left heart border partially obscured by overlying lung and pleural disease.  Fullness in the central hilar vessels, with interlobular septal thickening bilaterally.  Opacity at the left base appears similar to comparison plain film on both AP and lateral views.  Lucency at the margin of the cardiac silhouette on the lateral view, retrosternal window, worsened from the comparison plain film.  No displaced fracture.  Unremarkable appearance of the upper abdomen.  IMPRESSION: There appears to be enlarging pericardial effusion compared to the prior plain film. Correlation with echocardiography recommended to evaluate for cardiac output compromise given the history of respiratory failure.  Persisting low lung volumes with mild interstitial edema and likely atelectasis at the left base, or less likely pneumonia/pneumonitis.  These results were called by telephone at the time of interpretation on 04/17/2015 at 7:51 am to the nurse caring for the patient, Ms Dorita Fray, who verbally acknowledged these results.  Signed,  Yvone Neu. Loreta Ave, DO  [facility]  [facility]   Electronically Signed   By: Gilmer Mor D.O.   On: 04/17/2015 07:52     Medications:   . furosemide (LASIX) infusion 4 mg/hr (04/16/15 0609)   . cyanocobalamin  1,000 mcg Intramuscular Q30 days  . gabapentin  100 mg Oral BID  . insulin aspart  0-15 Units Subcutaneous TID WC  . insulin aspart  0-5 Units Subcutaneous QHS  . levothyroxine  100 mcg Oral QAC breakfast  . mirtazapine  30 mg Oral QHS  . PARoxetine  20 mg Oral Daily  . polyethylene glycol  17 g Oral Daily  . potassium chloride  20 mEq Oral Daily  . pravastatin  40 mg Oral QHS  . salmeterol  1 puff Inhalation Q12H  . spironolactone  25 mg Oral Daily  . tamsulosin  0.4 mg Oral Daily  . tiotropium  18 mcg Inhalation Daily  . vitamin B-12   2,000 mcg Oral Daily  . warfarin  10 mg Oral Once per day on Mon Fri  . warfarin  5 mg Oral Once per day on Sun Tue Wed Thu Sat   acetaminophen **OR** acetaminophen, alum & mag hydroxide-simeth, ipratropium-albuterol, lactulose, morphine injection, ondansetron **OR** ondansetron (ZOFRAN) IV, sodium chloride  Assessment/ Plan:  59 y.o. male with CKD stage III baseline Cr 1.3, hypertension, morbid obesity, chronic diastolic heart failure, paroxysmal atrial fibrillation, sleep apnea, diabetes mellitus, chronic edema, now presents with increasing edema, shortness of breath, pericardial effusion, pleural effusion, ascites, anasarca.  1.  Acute renal failure on CKD stage III: Exposed to contrast upon admission 2.  Acute diastolic congestive heart failure with pulmonary hypertension:  3. Hyponatremia: improved to 137 4. Hypokalemia: on potassium  and spironlactone.   Has underlying heart failure with anasarca but had worsening renal function with diuresis.  In addition he has low oncotic pressure state from low albumin.   - Renal function better wth good urine output. - Monitor urine output. Possibly at baseline.  - Continue IV furosemide 4mg /hour. Continue to monitor volume status and respiratory status.  - Renally dose all medication   LOS: 7 Tyler [name] 7/27/20168:17 AM

## 2015-04-17 NOTE — Progress Notes (Signed)
   04/17/15 2100  Clinical Encounter Type  Visited With Patient and family together  Visit Type Spiritual support  Spiritual Encounters  Spiritual Needs Emotional  Stress Factors  Patient Stress Factors Health changes  Family Stress Factors Health changes   Status: alert and oriented/anasarca Age/Sex: male 45 Faith: believes in God but not religious Family: wife is bedside and says that he has a very supportive family.  Visit Assessment: The patient is in good spirits and very opinionated. He shared that he loves history: from the [place] to [place] slavery. He said that he's accused of having issues with race at times. He said that he has issues with religion and preachers. He shared that he has been married for 40+ years. He said that he would fight God and Satan over his grandkids which live with him. His wife shared in his life review and history lesson and had to leave to go and cook, she said. The patient is also grieving about health changes, he mentioned that he is on the short end of the straw (near death). Chaplain offered a listening ear and encouraging words.  Pastoral care can be reached via pager [phone] or by submitting an order online

## 2015-04-17 NOTE — Care Management Important Message (Signed)
Important Message  Patient Details  Name: Tyler [name] MRN: [id number] Date of Birth: 1956/08/16   Medicare Important Message Given:  Yes-fourth notification given    Olegario Messier A Allmond 04/17/2015, 11:07 AM

## 2015-04-17 NOTE — Progress Notes (Signed)
PT Cancellation Note  Patient Details Name: Tyler [name] MRN: [id number] DOB: 19-Jan-1956   Cancelled Treatment:    Reason Eval/Treat Not Completed: Patient declined, no reason specified.  Was resting and eating a popsicle, asked to be left to tomorrow.   Ivar Drape 04/17/2015, 3:34 PM   Samul Dada, PT MS Acute Rehab Dept. Number: [facility] [id number] and [facility] [phone]

## 2015-04-17 NOTE — Progress Notes (Signed)
Tyler [name] is a 59 y.o. male  Anasarca   SUBJECTIVE:  Pt feeling better with less edema and improved SOB. Diuresing well with Lasix drip. Renal fxn stable. Echo from May 2016 showed EF=55%. Denies CP.  ______________________________________________________________________  ROS: Review of systems is unremarkable for any active cardiac,respiratory, GI, GU, hematologic, neurologic or psychiatric systems, 10 systems reviewed.  @  Past Medical History  Diagnosis Date  . Cataract   . CHF (congestive heart failure)   . COPD (chronic obstructive pulmonary disease)   . Diabetes mellitus without complication   . Sleep apnea   . Oxygen deficiency   . Thyroid disease   . Gout   . Hypothyroidism   . CKD (chronic kidney disease) stage 3, GFR 30-59 ml/min   . Asthma   . Hypertension     Past Surgical History  Procedure Laterality Date  . Cholecystectomy      2002  . Eye surgery      right cataract removed 3/21  . Vena cava filter placement      PHYSICAL EXAM:  BP 103/51 mmHg  Pulse 67  Temp(Src) 98.2 F (36.8 C) (Oral)  Resp 20  Ht 6' (1.829 m)  Wt 158.895 kg (350 lb 4.8 oz)  BMI 47.50 kg/m2  SpO2 95%  Wt Readings from Last 3 Encounters:  04/17/15 158.895 kg (350 lb 4.8 oz)  03/26/15 161.934 kg (357 lb)  03/12/15 152.862 kg (337 lb)            Constitutional: NAD Neck: supple, no thyromegaly Respiratory: basilar rales with no wheezes or rhonchi Cardiovascular: RRR, no murmur, no gallop Abdomen: soft, good BS, nontender Extremities: 3+ edema Neuro: alert and oriented, no focal motor or sensory deficits  ASSESSMENT/PLAN:  Labs and imaging studies were reviewed  CXR today. Will continue Lasix drip. Await f/u from Cardiology and Nephrology. Psych input appreciated. PC consult today. Repeat labs in AM. Hope to D/C from [facility] on Friday. CM working on D/C planning.

## 2015-04-17 NOTE — Consult Note (Signed)
  Psychiatry: Follow-up for patient with depression. Chart review. Came by to see patient this evening. He was on the piled and wave and looked like he was in good spirits.No change to medication. I will try and follow-up again prior to his discharge

## 2015-04-18 LAB — PROTEIN ELECTRO, RANDOM URINE
ALBUMIN ELP UR: 56 %
Alpha-1-Globulin, U: 7.1 %
Alpha-2-Globulin, U: 11.4 %
BETA GLOBULIN, U: 18.6 %
Gamma Globulin, U: 7 %
Total Protein, Urine: 70.8 mg/dL

## 2015-04-18 LAB — CBC WITH DIFFERENTIAL/PLATELET
Basophils Absolute: 0.1 10*3/uL (ref 0–0.1)
Basophils Relative: 2 %
EOS PCT: 5 %
Eosinophils Absolute: 0.2 10*3/uL (ref 0–0.7)
HEMATOCRIT: 31.2 % — AB (ref 40.0–52.0)
Hemoglobin: 10.3 g/dL — ABNORMAL LOW (ref 13.0–18.0)
Lymphocytes Relative: 21 %
Lymphs Abs: 0.9 10*3/uL — ABNORMAL LOW (ref 1.0–3.6)
MCH: 27.5 pg (ref 26.0–34.0)
MCHC: 33 g/dL (ref 32.0–36.0)
MCV: 83.3 fL (ref 80.0–100.0)
MONOS PCT: 14 %
Monocytes Absolute: 0.6 10*3/uL (ref 0.2–1.0)
NEUTROS PCT: 58 %
Neutro Abs: 2.5 10*3/uL (ref 1.4–6.5)
Platelets: 197 10*3/uL (ref 150–440)
RBC: 3.75 MIL/uL — ABNORMAL LOW (ref 4.40–5.90)
RDW: 16.8 % — ABNORMAL HIGH (ref 11.5–14.5)
WBC: 4.4 10*3/uL (ref 3.8–10.6)

## 2015-04-18 LAB — GLUCOSE, CAPILLARY
GLUCOSE-CAPILLARY: 184 mg/dL — AB (ref 65–99)
GLUCOSE-CAPILLARY: 182 mg/dL — AB (ref 65–99)
GLUCOSE-CAPILLARY: 213 mg/dL — AB (ref 65–99)
Glucose-Capillary: 166 mg/dL — ABNORMAL HIGH (ref 65–99)

## 2015-04-18 LAB — PROTIME-INR
INR: 2.43
Prothrombin Time: 26.5 seconds — ABNORMAL HIGH (ref 11.4–15.0)

## 2015-04-18 LAB — BASIC METABOLIC PANEL
ANION GAP: 9 (ref 5–15)
BUN: 29 mg/dL — AB (ref 6–20)
CALCIUM: 8.7 mg/dL — AB (ref 8.9–10.3)
CO2: 31 mmol/L (ref 22–32)
CREATININE: 1.65 mg/dL — AB (ref 0.61–1.24)
Chloride: 98 mmol/L — ABNORMAL LOW (ref 101–111)
GFR calc Af Amer: 51 mL/min — ABNORMAL LOW (ref >60)
GFR, EST NON AFRICAN AMERICAN: 44 mL/min — AB (ref >60)
Glucose, Bld: 174 mg/dL — ABNORMAL HIGH (ref 65–99)
POTASSIUM: 3.9 mmol/L (ref 3.5–5.1)
SODIUM: 138 mmol/L (ref 135–145)

## 2015-04-18 MED ORDER — FUROSEMIDE 40 MG PO TABS
40.0000 mg | ORAL_TABLET | Freq: Three times a day (TID) | ORAL | Status: DC
  Administered 2015-04-18 (×3): 40 mg via ORAL
  Filled 2015-04-18 (×3): qty 1

## 2015-04-18 NOTE — Progress Notes (Signed)
Physical Therapy Treatment Patient Details Name: Tyler [name] MRN: [id number] DOB: [birth year] Today's Date: 04/18/2015    History of Present Illness Tyler [name] is a 59 y.o. male with a known history of COPD, chronic respiratory failure 2.5 L nasal Baseline, type 2 diabetes mellitus requiring, moderate pulmonary hypertension presenting with abdominal pain.    PT Comments    Pt reports feeling of abdominal fullness since this AM.  Have reported to nursing that pt is using 3L on hallway and sats dropped.  He is likely to go home tomorrow and talked with him about his note that he just adjusts O2 at home as needed.  Told pt that he needs to discuss with his MD first.  Follow Up Recommendations  Home health PT     Equipment Recommendations  Other (comment)    Recommendations for Other Services       Precautions / Restrictions Precautions Precautions: None Restrictions Weight Bearing Restrictions: No    Mobility  Bed Mobility Overal bed mobility: Modified Independent                Transfers Overall transfer level: Modified independent Equipment used: Rolling walker (2 wheeled) Transfers: Sit to/from [employer] Sit to Stand: Supervision Stand pivot transfers: Supervision       General transfer comment: good body mechanics  Ambulation/Gait Ambulation/Gait assistance: Supervision;Min guard Ambulation Distance (Feet): 150 Feet Assistive device: Rolling walker (2 wheeled) Gait Pattern/deviations: Decreased stride length;Step-through pattern;Wide base of support;Trunk flexed Gait velocity: reduced Gait velocity interpretation: Below normal speed for age/gender General Gait Details: Pt is demonstrating more fatigue today but did have pericardial effusion on imaging report from the last 24 hours.   Stairs            Wheelchair Mobility    Modified Rankin (Stroke Patients Only)       Balance Overall balance assessment:  Modified Independent                                  Cognition Arousal/Alertness: Awake/alert Behavior During Therapy: WFL for tasks assessed/performed Overall Cognitive Status: Within Functional Limits for tasks assessed                      Exercises      General Comments General comments (skin integrity, edema, etc.): O2 sats are reduced with gait:  at rest, 97% but with gait on 3L O2 is 86% then improved to 90+ after 2 minutes      Pertinent Vitals/Pain Pain Assessment: No/denies pain    Home Living                      Prior Function            PT Goals (current goals can now be found in the care plan section) Acute Rehab PT Goals Patient Stated Goal: Walk with more energy Progress towards PT goals: Progressing toward goals    Frequency  Min 2X/week    PT Plan Current plan remains appropriate    Co-evaluation             End of Session Equipment Utilized During Treatment: Gait belt;Oxygen Activity Tolerance: Patient limited by lethargy;Patient limited by fatigue Patient left: in bed;with call bell/phone within reach (sitting on bedside)     Time: 1535-1558 PT Time Calculation (min) (ACUTE ONLY): 23 min  Charges:  $Gait Training: 8-22  mins $Therapeutic Activity: 8-22 mins                    G Codes:      Tyler [name] May 18, 2015, 4:16 PM  Tyler [name], PT MS Acute Rehab Dept. Number: [facility] [id number] and [facility] [phone]

## 2015-04-18 NOTE — Consult Note (Signed)
Palliative Care Update  Made an introductory visit today and will return tomorrow.   Pt thinks he will be going home soon. He is up and says he feels like he is back to normal. Doesn't like the food here, but his tray shows he has eaten 100%.  Full note to follow.   Cammie Mcgee MD

## 2015-04-18 NOTE — Progress Notes (Signed)
Up in BR and tolerated it well. NSR. 3.5 L of oxygen. Pt has not reported any pain. FS are stable. A & O. Takes meds ok. Belly distended. Lasix drip was d/c and changed to PO. EF 50-55%. Urinal. PT suggested home PT. Possible d/c tomorrow. Pt has no further concerns at this time.

## 2015-04-18 NOTE — Progress Notes (Signed)
Tyler [name] is a 59 y.o. male  Anasarca   SUBJECTIVE:  Pt in NAD. Good UOP. Renal fxn stable. Wants to go home today. Weight down. Less edema.  ______________________________________________________________________  ROS: Review of systems is unremarkable for any active cardiac,respiratory, GI, GU, hematologic, neurologic or psychiatric systems, 10 systems reviewed.  @  Past Medical History  Diagnosis Date  . Cataract   . CHF (congestive heart failure)   . COPD (chronic obstructive pulmonary disease)   . Diabetes mellitus without complication   . Sleep apnea   . Oxygen deficiency   . Thyroid disease   . Gout   . Hypothyroidism   . CKD (chronic kidney disease) stage 3, GFR 30-59 ml/min   . Asthma   . Hypertension     Past Surgical History  Procedure Laterality Date  . Cholecystectomy      2002  . Eye surgery      right cataract removed 3/21  . Vena cava filter placement      PHYSICAL EXAM:  BP 103/55 mmHg  Pulse 62  Temp(Src) 98.4 F (36.9 C) (Oral)  Resp 16  Ht 6' (1.829 m)  Wt 158.986 kg (350 lb 8 oz)  BMI 47.53 kg/m2  SpO2 95%  Wt Readings from Last 3 Encounters:  04/18/15 158.986 kg (350 lb 8 oz)  03/26/15 161.934 kg (357 lb)  03/12/15 152.862 kg (337 lb)            Constitutional: NAD Neck: supple, no thyromegaly Respiratory: basilar rales with no wheezes wheezes Cardiovascular: RRR, no murmur, no gallop Abdomen: soft, good BS, nontender Extremities: 3+ edema Neuro: alert and oriented, no focal motor or sensory deficits  ASSESSMENT/PLAN:  Labs and imaging studies were reviewed  Will change to po Lasix today. Monitor UOP closely. Plan D/C home tomorrow with home health if stable.

## 2015-04-18 NOTE — Care Management (Signed)
Anticipate discharge home 7/29 with home health.  Agency preference remains to be [facility].   Patient with complicated chf and is at high risk for readmission.  Spoke with Dr Judithann Sheen regarding HRI and in agreement.  Spoke with [facility] and informed that [facility] is this week's [facility] agency.  Patient declines [facility] and wishes to be followed by [facility].  Have also requested telehealth, initial daily SN visits and initial visit within 24 hours of discharge.  Patient declines.  Will speak with [facility] Caseworker regarding discharge and telehealth

## 2015-04-18 NOTE — Progress Notes (Signed)
[facility]  ROUNDING NOTE   Subjective:  Patient laying in bed. No respiratory distress.  UOP 1700 (2200) (2500).   Creatinine 1.65 (1.7) (1.67) (2.16) (2.56) Objective:  Vital signs in last 24 hours:  Temp:  [97.9 F (36.6 C)-98.4 F (36.9 C)] 98.4 F (36.9 C) (07/28 0530) Pulse Rate:  [62-68] 62 (07/28 0530) Resp:  [16-18] 16 (07/28 0530) BP: (103-124)/(53-73) 103/55 mmHg (07/28 0530) SpO2:  [95 %-98 %] 95 % (07/28 0530) Weight:  [158.986 kg (350 lb 8 oz)] 158.986 kg (350 lb 8 oz) (07/28 0530)  Weight change: 0.091 kg (3.2 oz) Filed Weights   04/16/15 0501 04/17/15 0430 04/18/15 0530  Weight: 161.344 kg (355 lb 11.2 oz) 158.895 kg (350 lb 4.8 oz) 158.986 kg (350 lb 8 oz)    Intake/Output: I/O last 3 completed shifts: In: 720 [P.O.:720] Out: 3125 [Urine:3125]   Intake/Output this shift:     Physical Exam: General: NAD, obese, sitting  Head: Normocephalic, atraumatic. Moist oral mucosal membranes  Eyes: Anicteric  Neck: Supple, trachea midline  Lungs:  Clear  Heart: Regular rate and rhythm S1S2 no rubs  Abdomen:  Soft, nontender, obese, BS present  Extremities:  1+ peripheral edema  Neurologic: Nonfocal, moving all four extremities  Skin: No lesions  Access: none    Basic Metabolic Panel:  Recent Labs Lab 04/14/15 0434 04/15/15 0435 04/16/15 0521 04/17/15 0406 04/18/15 0443  NA 131* 134* 135 137 138  K 4.4 3.7 3.8 4.0 3.9  CL 92* 95* 96* 97* 98*  CO2 GLUCOSE 157* 155* 140* 173* 174*  BUN 39* 37* 34* 31* 29*  CREATININE 2.56* 2.16* 1.67* 1.70* 1.65*  CALCIUM 8.3* 8.4* 8.5* 8.7* 8.7*    Liver Function Tests:  Recent Labs Lab 04/12/15 0504 04/13/15 0447 04/16/15 0521  AST ALT 8* 8* 7*  ALKPHOS 180* 178* 177*  BILITOT 0.6 0.8 0.7  PROT 6.6 5.9* 6.2*  ALBUMIN 3.1* 2.8* 2.8*   No results for input(s): LIPASE, AMYLASE in the last 168 hours. No results for input(s): AMMONIA in the last 168  hours.  CBC:  Recent Labs Lab 04/12/15 0504 04/13/15 0447 04/14/15 0434 04/16/15 0521 04/17/15 0406 04/18/15 0443  WBC 6.2 5.4 5.1 4.8 4.9 4.4  NEUTROABS 4.8 3.9  --  2.9 3.1 2.5  HGB 10.2* 9.9* 10.1* 10.1* 10.1* 10.3*  HCT 31.6* 30.0* 30.6* 30.4* 31.1* 31.2*  MCV 82.5 83.0 83.0 83.1 82.6 83.3  PLT 197 187 183 194 200 197    Cardiac Enzymes: No results for input(s): CKTOTAL, CKMB, CKMBINDEX, TROPONINI in the last 168 hours.  BNP: Invalid input(s): POCBNP  CBG:  Recent Labs Lab 04/17/15 0732 04/17/15 1113 04/17/15 1635 04/17/15 2009 04/18/15 0726  GLUCAP 180* 180* 161* 201* 184*    Microbiology: Results for orders placed or performed in visit on 08/21/14  ED Influenza     Status: None   Collection Time: 08/21/14  4:53 PM  Result Value Ref Range Status   Influenza A By PCR NEGATIVE NEGATIVE Final   Influenza B By PCR NEGATIVE NEGATIVE Final   H1N1 flu by pcr NOT DETECTED NOT-DETECTED Final    Comment:                  ----------------------- The Xpert Flu assay (FDA approval for nasal aspirates or washes and nasopharyngeal swab specimens), is intended as an aid in the diagnosis of influenza and should not be used as  a sole basis for treatment.     Coagulation Studies:  Recent Labs  04/15/15 1200 04/16/15 0521 04/17/15 0406 04/18/15 0443  LABPROT 26.4* 27.8* 27.4* 26.5*  INR 2.41 2.58 2.53 2.43    Urinalysis: No results for input(s): COLORURINE, LABSPEC, PHURINE, GLUCOSEU, HGBUR, BILIRUBINUR, KETONESUR, PROTEINUR, UROBILINOGEN, NITRITE, LEUKOCYTESUR in the last 72 hours.  Invalid input(s): APPERANCEUR    Imaging: Dg Chest 2 View  04/17/2015   CLINICAL DATA:  59 year old male with a history of COPD and chronic respiratory failure. Abdominal pain.  EXAM: CHEST - 2 VIEW  COMPARISON:  04/13/2015, 04/11/2015, 04/09/2015, CT chest 04/05/2015  FINDINGS: Cardiomediastinal silhouette unchanged. Left heart border partially obscured by overlying lung and  pleural disease.  Fullness in the central hilar vessels, with interlobular septal thickening bilaterally.  Opacity at the left base appears similar to comparison plain film on both AP and lateral views.  Lucency at the margin of the cardiac silhouette on the lateral view, retrosternal window, worsened from the comparison plain film.  No displaced fracture.  Unremarkable appearance of the upper abdomen.  IMPRESSION: There appears to be enlarging pericardial effusion compared to the prior plain film. Correlation with echocardiography recommended to evaluate for cardiac output compromise given the history of respiratory failure.  Persisting low lung volumes with mild interstitial edema and likely atelectasis at the left base, or less likely pneumonia/pneumonitis.  These results were called by telephone at the time of interpretation on 04/17/2015 at 7:51 am to the nurse caring for the patient, Ms Dorita Fray, who verbally acknowledged these results.  Signed,  Yvone Neu. Loreta Ave, DO  [facility]  [facility]   Electronically Signed   By: Gilmer Mor D.O.   On: 04/17/2015 07:52     Medications:     . cyanocobalamin  1,000 mcg Intramuscular Q30 days  . furosemide  40 mg Oral TID  . gabapentin  100 mg Oral BID  . insulin aspart  0-15 Units Subcutaneous TID WC  . insulin aspart  0-5 Units Subcutaneous QHS  . levothyroxine  100 mcg Oral QAC breakfast  . mirtazapine  30 mg Oral QHS  . PARoxetine  20 mg Oral Daily  . polyethylene glycol  17 g Oral Daily  . potassium chloride  20 mEq Oral Daily  . pravastatin  40 mg Oral QHS  . salmeterol  1 puff Inhalation Q12H  . spironolactone  25 mg Oral Daily  . tamsulosin  0.4 mg Oral Daily  . tiotropium  18 mcg Inhalation Daily  . vitamin B-12  2,000 mcg Oral Daily  . warfarin  10 mg Oral Once per day on Mon Fri  . warfarin  5 mg Oral Once per day on Sun Tue Wed Thu Sat   acetaminophen **OR** acetaminophen, alum &  mag hydroxide-simeth, ipratropium-albuterol, lactulose, morphine injection, ondansetron **OR** ondansetron (ZOFRAN) IV, sodium chloride  Assessment/ Plan:  59 y.o. male with CKD stage III baseline Cr 1.3, hypertension, morbid obesity, chronic diastolic heart failure, paroxysmal atrial fibrillation, sleep apnea, diabetes mellitus, chronic edema, now presents with increasing edema, shortness of breath, pericardial effusion, pleural effusion, ascites, anasarca.  1.  Acute renal failure on CKD stage III: Exposed to contrast upon admission 2.  Acute diastolic congestive heart failure with pulmonary hypertension:  3. Hyponatremia: improved to 138 4. Hypokalemia: on potassium and spironlactone.   Has underlying heart failure with anasarca but had worsening renal function with diuresis.  In addition he has low oncotic pressure state from  low albumin.   - Renal function better wth good urine output. Possibly new baseline creatinine.  - Monitor urine output. Possibly at baseline.  - Continue PO furosemide. Continue to monitor volume status and respiratory status.  - Renally dose all medication   LOS: 8 Kohan Azizi 7/28/20169:22 AM

## 2015-04-19 ENCOUNTER — Other Ambulatory Visit: Payer: Self-pay | Admitting: *Deleted

## 2015-04-19 LAB — CBC WITH DIFFERENTIAL/PLATELET
BASOS PCT: 1 %
Basophils Absolute: 0.1 10*3/uL (ref 0–0.1)
Eosinophils Absolute: 0.2 10*3/uL (ref 0–0.7)
Eosinophils Relative: 5 %
HEMATOCRIT: 31.1 % — AB (ref 40.0–52.0)
Hemoglobin: 10.2 g/dL — ABNORMAL LOW (ref 13.0–18.0)
LYMPHS ABS: 0.9 10*3/uL — AB (ref 1.0–3.6)
Lymphocytes Relative: 19 %
MCH: 27.3 pg (ref 26.0–34.0)
MCHC: 32.7 g/dL (ref 32.0–36.0)
MCV: 83.5 fL (ref 80.0–100.0)
MONO ABS: 0.6 10*3/uL (ref 0.2–1.0)
MONOS PCT: 13 %
NEUTROS PCT: 62 %
Neutro Abs: 2.8 10*3/uL (ref 1.4–6.5)
PLATELETS: 185 10*3/uL (ref 150–440)
RBC: 3.73 MIL/uL — ABNORMAL LOW (ref 4.40–5.90)
RDW: 17.3 % — ABNORMAL HIGH (ref 11.5–14.5)
WBC: 4.6 10*3/uL (ref 3.8–10.6)

## 2015-04-19 LAB — BASIC METABOLIC PANEL
Anion gap: 11 (ref 5–15)
BUN: 29 mg/dL — AB (ref 6–20)
CO2: 30 mmol/L (ref 22–32)
Calcium: 8.7 mg/dL — ABNORMAL LOW (ref 8.9–10.3)
Chloride: 99 mmol/L — ABNORMAL LOW (ref 101–111)
Creatinine, Ser: 1.48 mg/dL — ABNORMAL HIGH (ref 0.61–1.24)
GFR calc Af Amer: 58 mL/min — ABNORMAL LOW (ref >60)
GFR, EST NON AFRICAN AMERICAN: 50 mL/min — AB (ref >60)
Glucose, Bld: 137 mg/dL — ABNORMAL HIGH (ref 65–99)
Potassium: 3.8 mmol/L (ref 3.5–5.1)
Sodium: 140 mmol/L (ref 135–145)

## 2015-04-19 LAB — GLUCOSE, CAPILLARY: GLUCOSE-CAPILLARY: 155 mg/dL — AB (ref 65–99)

## 2015-04-19 LAB — PROTIME-INR
INR: 2.21
Prothrombin Time: 24.7 seconds — ABNORMAL HIGH (ref 11.4–15.0)

## 2015-04-19 MED ORDER — FUROSEMIDE 80 MG PO TABS: 80.0000 mg | ORAL_TABLET | Freq: Two times a day (BID) | ORAL | Status: DC

## 2015-04-19 MED ORDER — IPRATROPIUM-ALBUTEROL 0.5-2.5 (3) MG/3ML IN SOLN: 3.0000 mL | RESPIRATORY_TRACT | Status: DC | PRN

## 2015-04-19 MED ORDER — SALMETEROL XINAFOATE 50 MCG/DOSE IN AEPB: 1.0000 | INHALATION_SPRAY | Freq: Two times a day (BID) | RESPIRATORY_TRACT | Status: DC

## 2015-04-19 MED ORDER — METOLAZONE 2.5 MG PO TABS
2.5000 mg | ORAL_TABLET | Freq: Every day | ORAL | Status: DC
  Filled 2015-04-19 (×2): qty 1

## 2015-04-19 MED ORDER — METOLAZONE 2.5 MG PO TABS: 2.5000 mg | ORAL_TABLET | Freq: Every day | ORAL | Status: DC

## 2015-04-19 MED ORDER — ONDANSETRON HCL 4 MG PO TABS: 4.0000 mg | ORAL_TABLET | Freq: Four times a day (QID) | ORAL | Status: AC | PRN

## 2015-04-19 MED ORDER — MIRTAZAPINE 30 MG PO TABS: 30.0000 mg | ORAL_TABLET | Freq: Every day | ORAL | Status: DC

## 2015-04-19 MED ORDER — FUROSEMIDE 40 MG PO TABS: 80.0000 mg | ORAL_TABLET | Freq: Two times a day (BID) | ORAL | Status: DC

## 2015-04-19 MED ORDER — POLYETHYLENE GLYCOL 3350 17 G PO PACK: 17.0000 g | PACK | Freq: Every day | ORAL | Status: DC

## 2015-04-19 MED ORDER — TAMSULOSIN HCL 0.4 MG PO CAPS: 0.4000 mg | ORAL_CAPSULE | Freq: Every day | ORAL | Status: AC

## 2015-04-19 NOTE — Discharge Summary (Signed)
Tyler [name], is a 59 y.o. male  DOB April 07, 1956  MRN [id number].  Admission date:  04/09/2015  Admitting Physician  Wyatt Haste, MD  Discharge Date:  04/19/2015   Primary MD  Mickey Farber, MD  Recommendations for primary care physician for things to follow:   none   Admission Diagnosis  Anasarca [R60.1] Acute exacerbation of congestive heart failure [I50.9]   Discharge Diagnosis  Anasarca [R60.1] Acute exacerbation of congestive heart failure [I50.9]  CKD, anemia, DM  Principal Problem:   Anasarca Active Problems:   COPD, moderate   CHF (congestive heart failure)   Depression, major, recurrent, moderate      Past Medical History  Diagnosis Date  . Cataract   . CHF (congestive heart failure)   . COPD (chronic obstructive pulmonary disease)   . Diabetes mellitus without complication   . Sleep apnea   . Oxygen deficiency   . Thyroid disease   . Gout   . Hypothyroidism   . CKD (chronic kidney disease) stage 3, GFR 30-59 ml/min   . Asthma   . Hypertension     Past Surgical History  Procedure Laterality Date  . Cholecystectomy      2002  . Eye surgery      right cataract removed 3/21  . Vena cava filter placement         History of present illness and  Hospital Course:     Kindly see H&P for history of present illness and admission details, please review complete Labs, Consult reports and Test reports for all details in brief  HPI  from the history and physical done on the day of admission    Hospital Course    Pt admitted with anasarca and volume overload, diuresed with IV Lasix, Seen by Nephrology and Cardiology. No evidence of MI. Feeling better with less ed3ema and less SOB. Wants to go home with home health.   Discharge Condition: stable   Follow UP  Follow-up Information    Follow up with Lamar Blinks, MD In 5 days.   Specialty:  Internal  Medicine   Contact information:   [address] [place] [address] [phone]         Discharge Instructions  and  Discharge Medications   See below     Medication List    STOP taking these medications        torsemide 20 MG tablet  Commonly known as:  DEMADEX      TAKE these medications        colchicine 0.6 MG tablet  0.6 mg 2 (two) times daily. TAKE ONE TABLET BY MOUTH TWICE DAILY AS NEEDED FOR  WRIST  PAIN     cyanocobalamin 1000 MCG/ML injection  Commonly known as:  (VITAMIN B-12)  Inject 1,000 mcg into the muscle every 30 (thirty) days.     vitamin B-12 1000 MCG tablet  Commonly known as:  CYANOCOBALAMIN  Take 2,000 mcg by mouth daily.     furosemide 80 MG  tablet  Commonly known as:  LASIX  Take 1 tablet (80 mg total) by mouth 2 (two) times daily.     gabapentin 100 MG capsule  Commonly known as:  NEURONTIN  Take 100 mg by mouth 2 (two) times daily.     insulin NPH Human 100 UNIT/ML injection  Commonly known as:  HUMULIN N,NOVOLIN N  Inject 80 Units into the skin 2 (two) times daily before a meal. 80 units subcutaneous 30 min prior to breakfast, 80 units subcutaneous 30 min prior to supper.     insulin regular 100 units/mL injection  Commonly known as:  NOVOLIN R,HUMULIN R  Inject 10 Units into the skin 2 (two) times daily before a meal. 10 units 30 min prior to meal if BG >120 & eating     ipratropium-albuterol 0.5-2.5 (3) MG/3ML Soln  Commonly known as:  DUONEB  Take 3 mLs by nebulization every 4 (four) hours as needed.     lactulose 10 GM/15ML solution  Commonly known as:  CHRONULAC  Take 30 g by mouth 2 (two) times daily as needed for mild constipation or moderate constipation.     levothyroxine 100 MCG tablet  Commonly known as:  SYNTHROID, LEVOTHROID  Take 100 mcg by mouth daily. Patient takes this medication at 6am.     metolazone 2.5 MG tablet  Commonly known as:  ZAROXOLYN  Take 1 tablet (2.5 mg total) by mouth daily.      mirtazapine 30 MG tablet  Commonly known as:  REMERON  Take 1 tablet (30 mg total) by mouth at bedtime.     nystatin powder  Commonly known as:  MYCOSTATIN  Apply 1 g topically daily as needed (for rash.).     ondansetron 4 MG tablet  Commonly known as:  ZOFRAN  Take 1 tablet (4 mg total) by mouth every 6 (six) hours as needed for nausea.     PARoxetine 20 MG tablet  Commonly known as:  PAXIL  Take 20 mg by mouth daily.     polyethylene glycol packet  Commonly known as:  MIRALAX / GLYCOLAX  Take 17 g by mouth daily.     potassium chloride 10 MEQ tablet  Commonly known as:  K-DUR,KLOR-CON  Take 20 mEq by mouth daily. TAKE TWO TABLETS BY MOUTH ONCE DAILY     pravastatin 40 MG tablet  Commonly known as:  PRAVACHOL  Take 40 mg by mouth at bedtime.     salmeterol 50 MCG/DOSE diskus inhaler  Commonly known as:  SEREVENT  Inhale 1 puff into the lungs every 12 (twelve) hours.     spironolactone 25 MG tablet  Commonly known as:  ALDACTONE  Take 25 mg by mouth daily.     tamsulosin 0.4 MG Caps capsule  Commonly known as:  FLOMAX  Take 1 capsule (0.4 mg total) by mouth daily.     Tiotropium Bromide-Olodaterol 2.5-2.5 MCG/ACT Aers  Inhale 1 spray into the lungs daily as needed (for wheezing and shortness of breath.).     warfarin 10 MG tablet  Commonly known as:  COUMADIN  Take 5-10 mg by mouth daily. Pt states 1 tablet orally Monday and Friday.  Take 1/2 tablet (5mg ) orally Tuesday, Wednesday, Thursday, Saturday, and Sunday.          Diet and Activity recommendation: See Discharge Instructions above   Consults obtained - Cardiology, Nephrology, CM   Major procedures and Radiology Reports - PLEASE review detailed and final reports for all details, in  brief -   See below   Dg Chest 2 View  04/17/2015   CLINICAL DATA:  59 year old male with a history of COPD and chronic respiratory failure. Abdominal pain.  EXAM: CHEST - 2 VIEW  COMPARISON:  04/13/2015, 04/11/2015,  04/09/2015, CT chest 04/05/2015  FINDINGS: Cardiomediastinal silhouette unchanged. Left heart border partially obscured by overlying lung and pleural disease.  Fullness in the central hilar vessels, with interlobular septal thickening bilaterally.  Opacity at the left base appears similar to comparison plain film on both AP and lateral views.  Lucency at the margin of the cardiac silhouette on the lateral view, retrosternal window, worsened from the comparison plain film.  No displaced fracture.  Unremarkable appearance of the upper abdomen.  IMPRESSION: There appears to be enlarging pericardial effusion compared to the prior plain film. Correlation with echocardiography recommended to evaluate for cardiac output compromise given the history of respiratory failure.  Persisting low lung volumes with mild interstitial edema and likely atelectasis at the left base, or less likely pneumonia/pneumonitis.  These results were called by telephone at the time of interpretation on 04/17/2015 at 7:51 am to the nurse caring for the patient, Ms Dorita Fray, who verbally acknowledged these results.  Signed,  Yvone Neu. Loreta Ave, DO  [facility]  [facility]   Electronically Signed   By: Gilmer Mor D.O.   On: 04/17/2015 07:52   Dg Chest 2 View  04/13/2015   CLINICAL DATA:  History of CHF, no current chest complaints  EXAM: CHEST - 2 VIEW  COMPARISON:  04/11/2015  FINDINGS: Cardiac shadow remains enlarged. Vascular congestion is again identified with mild interstitial edema. Persistent left basilar opacity is noted. No new focal abnormality is seen.  IMPRESSION: Stable CHF.  Persistent left basilar opacity without significant change from the prior exam.   Electronically Signed   By: Alcide Clever M.D.   On: 04/13/2015 08:18   Dg Chest 2 View  04/11/2015   CLINICAL DATA:  CHF, COPD -asthma, diabetes, morbid obesity.  EXAM: CHEST  2 VIEW  COMPARISON:  Portable chest x-ray of April 09, 2015  FINDINGS: The right lung is adequately inflated. There is no focal infiltrate. The interstitial markings are coarse and slightly more conspicuous today. On the left there is slight interval improvement in left lower lobe atelectasis or pneumonia. The left hemidiaphragm is faintly visible and the left heart border is slightly better visualized. A pleural effusion may be present. The trachea is midline. The cardiac silhouette where visualized is enlarged. The pulmonary vascularity is engorged.  IMPRESSION: 1. Slight interval improvement in aeration of the left lower lobe may reflect resolving atelectasis or pneumonia. 2. CHF with pulmonary interstitial edema slightly more conspicuous today.   Electronically Signed   By: David  [place] M.D.   On: 04/11/2015 07:41   Ct Chest Wo Contrast  04/05/2015   CLINICAL DATA:  Chronic respiratory failure  EXAM: CT CHEST WITHOUT CONTRAST  TECHNIQUE: Multidetector CT imaging of the chest was performed following the standard protocol without IV contrast.  COMPARISON:  None  FINDINGS: Mediastinum: The heart size is mildly enlarged. There is a small to moderate pericardial effusion noted. This is similar to 05/30/2014. Aortic atherosclerosis identified. The trachea appears patent and is midline. Normal appearance of the esophagus. Similar appearance of prominent mediastinal lymph nodes. The largest is in the high right paratracheal region measuring 11 mm, image 11/series 2.  Lungs/Pleura: There are small bilateral pleural effusions. Atelectasis is noted  in the left lung day stress set atelectasis is identified within the superior segment of both lower lobes. No lobar consolidation. Chronic mosaic attenuation pattern is identified in both lungs, with patchy areas of ground-glass attenuation in both lungs. The appearance is similar to previous exam. There is no airspace consolidation identified.  Upper Abdomen: No focal liver or splenic abnormality. There is moderate ascites  within the upper abdomen. The adrenal glands are unremarkable.  Musculoskeletal: Mild multi level degenerative disc disease identified within the thoracic spine.  IMPRESSION: 1. Mild cardiac enlargement and pericardial effusion is again noted and appears similar to previous exam 2. Aortic atherosclerosis 3. Patchy areas of ground-glass attenuation are again identified in both lungs. This is a finding which is been present dating back to 2012 is likely related to underlying interstitial lung disease such as NSIP. 4. Small bilateral pleural effusions and  left base scarring.   Electronically Signed   By: Signa Kell M.D.   On: 04/05/2015 16:10   Ct Abdomen Pelvis W Contrast  04/09/2015   CLINICAL DATA:  Constipation and abdominal pain, 40 pound weight gain in last 2 months  EXAM: CT ABDOMEN AND PELVIS WITH CONTRAST  TECHNIQUE: Multidetector CT imaging of the abdomen and pelvis was performed using the standard protocol following bolus administration of intravenous contrast.  CONTRAST:  OMNIPAQUE IOHEXOL 350 MG/ML SOLN  COMPARISON:  CT scan 05/09/2014  FINDINGS: Lower chest: Moderate to large pericardial effusion, increased when compared to prior study. Small bilateral pleural effusions new or increased when compared to prior study. Visualized portions of the lung bases clear except for atelectasis left base.  Hepatobiliary: Status post cholecystectomy. No focal hepatic abnormalities.  Pancreas: Normal  Spleen: Normal  Adrenals/Urinary Tract: Adrenal glands are normal. Bilateral renal atrophy noted. Kidneys otherwise negative.  Stomach/Bowel: Negative  Vascular/Lymphatic: Inferior vena cava filter noted.  Reproductive: Appear to be absent  Other: Study limited by body habitus. Diffuse increased subcutaneous soft tissue attenuation suggesting anasarca. Moderate volume of ascites.  Musculoskeletal: Mild compression deformity of T11 appears mildly progressive when compared to prior study. No evidence of  retropulsion.  IMPRESSION: Pericardial and pleural effusions with anasarca and ascites.  Mild interval progression of a mild T11 compression deformity.   Electronically Signed   By: Esperanza Heir M.D.   On: 04/09/2015 22:03   US Renal  04/11/2015   CLINICAL DATA:  Acute renal failure.  EXAM: RENAL / URINARY TRACT ULTRASOUND COMPLETE  COMPARISON:  CT 04/09/2015  FINDINGS: Right Kidney:  Length: 16.4 cm. Echogenicity within normal limits. No mass or hydronephrosis visualized.  Left Kidney:  Length: 15.3 cm. Echogenicity within normal limits. No mass or hydronephrosis visualized.  Bladder:  Not visualized  Ascites most pronounced within the right upper and right lower quadrants.  IMPRESSION: No hydronephrosis.  Bladder is not well visualized.  Ascites.   Electronically Signed   By: Annia Belt M.D.   On: 04/11/2015 16:53   US Abdomen Limited  04/10/2015   CLINICAL DATA:  Ascites.  EXAM: LIMITED ABDOMINAL ULTRASOUND  COMPARISON:  CT scan of April 09, 2015.  FINDINGS: Sonographic evaluation demonstrates mild ascites in the right and left lower quadrants. However, due to the depth of these areas and the small fluid pockets present, paracentesis was not attempted.  IMPRESSION: Mild ascites is noted in the right and left lower quadrants.   Electronically Signed   By: Lupita Raider, M.D.   On: 04/10/2015 12:47   Dg Chest Portable 1  View  04/09/2015   CLINICAL DATA:  Chronic cough, with 40 lb weight gain. Initial encounter.  EXAM: PORTABLE CHEST - 1 VIEW  COMPARISON:  CT of the chest performed 04/05/2015, and chest radiograph performed 02/16/2015  FINDINGS: The lungs are well-aerated. A small left pleural effusion is noted. Vascular congestion is noted, with increased interstitial markings, concerning for mild pulmonary edema. There is no evidence of pneumothorax.  The cardiomediastinal silhouette is mildly enlarged. No acute osseous abnormalities are seen.  IMPRESSION: Small left pleural effusion noted. Vascular  congestion and mild cardiomegaly, with increased interstitial markings, concerning for mild pulmonary edema.   Electronically Signed   By: Roanna Raider M.D.   On: 04/09/2015 23:28    Micro Results   See below  No results found for this or any previous visit (from the past 240 hour(s)).     Today   Subjective:   Tyler [name] today has no headache,no chest abdominal pain,no new weakness tingling or numbness, feels much better wants to go home today.   Objective:   Blood pressure 105/48, pulse 73, temperature 98.6 F (37 C), temperature source Oral, resp. rate 18, height 6' (1.829 m), weight 158.487 kg (349 lb 6.4 oz), SpO2 93 %.   Intake/Output Summary (Last 24 hours) at 04/19/15 0657 Last data filed at 04/19/15 0423  Gross per 24 hour  Intake  608.5 ml  Output    551 ml  Net   57.5 ml    Exam Awake Alert, Oriented x 3, No new F.N deficits, Normal affect [place].AT,PERRAL Supple Neck,No JVD, No cervical lymphadenopathy appriciated.  Symmetrical Chest wall movement, Good air movement bilaterally, basilar rales RRR,No Gallops,Rubs or new Murmurs, No Parasternal Heave +ve B.Sounds, Abd distended, Non tender, No organomegaly appriciated, No rebound -guarding or rigidity. No Cyanosis, Clubbing. 3+ edema, No new Rash or bruise  Data Review   CBC w Diff: Lab Results  Component Value Date   WBC 4.6 04/19/2015   WBC 5.8 10/29/2014   HGB 10.2* 04/19/2015   HGB 12.9* 10/29/2014   HCT 31.1* 04/19/2015   HCT 40.0 10/29/2014   PLT 185 04/19/2015   PLT 221 10/29/2014   LYMPHOPCT 19 04/19/2015   LYMPHOPCT 19.5 10/29/2014   MONOPCT 13 04/19/2015   MONOPCT 8.5 10/29/2014   EOSPCT 5 04/19/2015   EOSPCT 2.2 10/29/2014   BASOPCT 1 04/19/2015   BASOPCT 1.1 10/29/2014    CMP: Lab Results  Component Value Date   NA 140 04/19/2015   NA 136 08/21/2014   K 3.8 04/19/2015   K 3.7 08/21/2014   CL 99* 04/19/2015   CL 98 08/21/2014   CO2 30 04/19/2015   CO2 30 08/21/2014    BUN 29* 04/19/2015   BUN 25* 08/21/2014   CREATININE 1.48* 04/19/2015   CREATININE 1.73* 08/21/2014   PROT 6.2* 04/16/2015   PROT 7.2 08/21/2014   ALBUMIN 2.8* 04/16/2015   ALBUMIN 2.8* 08/21/2014   BILITOT 0.7 04/16/2015   BILITOT 1.2* 08/21/2014   ALKPHOS 177* 04/16/2015   ALKPHOS 330* 08/21/2014   AST 20 04/16/2015   AST 25 08/21/2014   ALT 7* 04/16/2015   ALT 13* 08/21/2014  .   Total Time in preparing paper work, data evaluation and todays exam - 45 minutes  SPARKS,JEFFREY D M.D on 04/19/2015 at 6:57 AM

## 2015-04-19 NOTE — Care Management (Signed)
Patient discharged this morning before CM arrived.  Informed by [facility] that it is not [facility]'s week for [facility] .  Referral called to Edwina with [facility] and informed Francine Graven is in network with [employer]. Notified Amy White with [facility] of discharge plans.  Patient will be followed under [facility] disease management program.  His  [employer] nurse Okey Dupre will continue to follow on a monthly basis.  Asked Amy  about telehealth.  [facility] does not provide this service.

## 2015-04-19 NOTE — Patient Outreach (Signed)
Attempt made to contact pt - discharged today from [facility].   This RN CM wanted to verify with pt about upcoming  home visit for 8/4 (scheduled prior to hospitalization).   Unable to leave a voice message as message states number has been changed to a non published number.      Shayne Alken.   Pierzchala RN CCM [facility] Care Management  [phone]

## 2015-04-19 NOTE — Discharge Instructions (Signed)
Oxygen at 3L per [place] Needs appt with me in 10-14 days

## 2015-04-20 NOTE — Care Management Note (Signed)
Case Management Note  Patient Details  Name: Tyler [name] MRN: [id number] Date of Birth: 1956/07/11  Subjective/Objective:          This Clinical research associate received a call from Tyler [name]'s home health nurse Edwina ph: [phone].  Tyler [name] was discharged home Friday (yesterday ) with medications for use with a nebulizer but with no order for a nebulizer.  This Clinical research associate provided Tyler [name] with a home nebulizer machine from the [facility] DME closet today after home health nurse Sheral Flow stated that she would teach use of nebulizer machine to Tyler [name].      Action/Plan:   Expected Discharge Date:                  Expected Discharge Plan:     In-House Referral:     Discharge planning Services     Post Acute Care Choice:    Choice offered to:     DME Arranged:    DME Agency:     HH Arranged:    HH Agency:     Status of Service:     Medicare Important Message Given:  Yes-fourth notification given Date Medicare IM Given:    Medicare IM give by:    Date Additional Medicare IM Given:    Additional Medicare Important Message give by:     If discussed at Long Length of Stay Meetings, dates discussed:    Additional Comments:  Davion Flannery A, RN 04/20/2015, 2:59 PM

## 2015-04-23 ENCOUNTER — Other Ambulatory Visit: Payer: Self-pay | Admitting: *Deleted

## 2015-04-23 NOTE — Patient Outreach (Signed)
Called pt to confirm scheduled home visit for 8/4 as view in Epic- recent hospital discharge 7/29 (inpatient- [facility],acute HF).   Pt agreed to scheduled home visit.    Plan to f/u with pt 8/4 as scheduled.    Shayne Alken.   Coretha Creswell RN CCM [facility] Care Management  [phone]

## 2015-04-23 NOTE — Patient Outreach (Signed)
Received a return phone call from [facility] RN at [facility] to voice message left earlier by RN CM requesting updated phone number for pt.   HH RN provided current phone number for pt = [phone].   HH RN states plan is to see pt 3 times this week, 2 times the following week, then once a week for 3 weeks.  HH RN reports pt has an MD visit 8/3.    Plan to call pt to remind him of scheduled home visit for 8/4.     Shayne Alken.   Atavia Poppe RN CCM [facility] Care Management  [phone]

## 2015-04-25 ENCOUNTER — Other Ambulatory Visit: Payer: Commercial Managed Care - HMO | Admitting: *Deleted

## 2015-04-25 VITALS — BP 100/70 | HR 72 | Resp 24 | Ht 70.0 in | Wt 373.6 lb

## 2015-04-26 ENCOUNTER — Encounter: Payer: Self-pay | Admitting: *Deleted

## 2015-04-26 ENCOUNTER — Institutional Professional Consult (permissible substitution): Payer: Commercial Managed Care - HMO | Admitting: Pulmonary Disease

## 2015-04-26 NOTE — Patient Outreach (Signed)
[facility]) Care Management   04/26/2015  Tyler [name] Jul 15, 1956 [id number]  Tyler [name] is an 59 y.o. male  Subjective:  Pt reports saw Dr. Nehemiah Massed yesterday, to f/u again 8/31, MD feels CPAP might help with his swelling.  Pt states he missed sleep study because he was hospitalized, told need to talk to Dr. [name].  Pt states he is to see Dr. [name] 8/9.   Pt states he is getting calls from different people since discharge, lets his wife handle that.  Pt states HH RN, PT are coming.   Pt states he is not sob walking around, but if he talks too much starts coughing (non productive)and gets sob.   Pt states his weight today was 373.6 lbs (fully dressed, after eating), yesterday 371 lbs before eating.  Pt states his appetite picked up.  Pt states to see [facility]ney MD tomorrow.  Pt states he went 2 weeks  Without smoking (hospitalized), only had 2 cigars since discharge (7/29).   Objective:   Filed Vitals:   04/25/15 1554  BP: 100/70  Pulse: 72  Resp: 24    ROS  Physical Exam  Constitutional: He is oriented to person, place, and time. He appears well-developed and well-nourished.  Cardiovascular: Normal rate.   Respiratory:  Diminished sounds noted in RLL posteriorly.   Musculoskeletal: He exhibits edema.  Bilateral lower extremities   Neurological: He is alert and oriented to person, place, and time.  Skin: Skin is warm and dry.  Abrasion noted on right knee, changed band aid   Psychiatric: He has a normal mood and affect. His behavior is normal. Judgment and thought content normal.    Current Medications:  Reviewed medications with pt  Current Outpatient Prescriptions  Medication Sig Dispense Refill  . colchicine 0.6 MG tablet 0.6 mg 2 (two) times daily. TAKE ONE TABLET BY MOUTH TWICE DAILY AS NEEDED FOR  WRIST  PAIN    . cyanocobalamin (,VITAMIN B-12,) 1000 MCG/ML injection Inject 1,000 mcg into the muscle every 30 (thirty) days.    . furosemide  (LASIX) 80 MG tablet Take 1 tablet (80 mg total) by mouth 2 (two) times daily. 60 tablet 5  . gabapentin (NEURONTIN) 100 MG capsule Take 100 mg by mouth 2 (two) times daily.     . insulin NPH Human (HUMULIN N,NOVOLIN N) 100 UNIT/ML injection Inject 80 Units into the skin 2 (two) times daily before a meal. 80 units subcutaneous 30 min prior to breakfast, 80 units subcutaneous 30 min prior to supper.    . insulin regular (NOVOLIN R,HUMULIN R) 100 units/mL injection Inject 10 Units into the skin 2 (two) times daily before a meal. 10 units 30 min prior to meal if BG >120 & eating    . ipratropium-albuterol (DUONEB) 0.5-2.5 (3) MG/3ML SOLN Take 3 mLs by nebulization every 4 (four) hours as needed. 360 mL 5  . lactulose (CHRONULAC) 10 GM/15ML solution Take 30 g by mouth 2 (two) times daily as needed for mild constipation or moderate constipation.    Marland Kitchen levothyroxine (SYNTHROID, LEVOTHROID) 100 MCG tablet Take 100 mcg by mouth daily. Patient takes this medication at 6am.    . metolazone (ZAROXOLYN) 2.5 MG tablet Take 1 tablet (2.5 mg total) by mouth daily. 30 tablet 5  . mirtazapine (REMERON) 30 MG tablet Take 1 tablet (30 mg total) by mouth at bedtime. 30 tablet 5  . PARoxetine (PAXIL) 20 MG tablet Take 20 mg by mouth daily.     Marland Kitchen  polyethylene glycol (MIRALAX / GLYCOLAX) packet Take 17 g by mouth daily. 14 each 0  . potassium chloride (K-DUR,KLOR-CON) 10 MEQ tablet Take 20 mEq by mouth daily. TAKE TWO TABLETS BY MOUTH ONCE DAILY    . pravastatin (PRAVACHOL) 40 MG tablet Take 40 mg by mouth at bedtime.     . salmeterol (SEREVENT) 50 MCG/DOSE diskus inhaler Inhale 1 puff into the lungs every 12 (twelve) hours. 1 Inhaler 12  . spironolactone (ALDACTONE) 25 MG tablet Take 25 mg by mouth daily.     . tamsulosin (FLOMAX) 0.4 MG CAPS capsule Take 1 capsule (0.4 mg total) by mouth daily. 30 capsule 5  . Tiotropium Bromide-Olodaterol 2.5-2.5 MCG/ACT AERS Inhale 1 spray into the lungs daily as needed (for wheezing  and shortness of breath.).    Marland Kitchen vitamin B-12 (CYANOCOBALAMIN) 1000 MCG tablet Take 2,000 mcg by mouth daily.    Marland Kitchen warfarin (COUMADIN) 10 MG tablet Take 5-10 mg by mouth daily. Pt states 1 tablet orally Monday and Friday.  Take 1/2 tablet (63m) orally Tuesday, Wednesday, Thursday, Saturday, and Sunday.    . nystatin (MYCOSTATIN) powder Apply 1 g topically daily as needed (for rash.).     .Marland Kitchenondansetron (ZOFRAN) 4 MG tablet Take 1 tablet (4 mg total) by mouth every 6 (six) hours as needed for nausea. (Patient not taking: Reported on 04/25/2015) 20 tablet 0   No current facility-administered medications for this visit.    Functional Status:   In your present state of health, do you have any difficulty performing the following activities: 04/10/2015 02/16/2015  Hearing? N N  Vision? N N  Difficulty concentrating or making decisions? N N  Walking or climbing stairs? Y Y  Dressing or bathing? N N  Doing errands, shopping? N N  Preparing Food and eating ? - -  Using the Toilet? - -  In the past six months, have you accidently leaked urine? - -  Do you have problems with loss of bowel control? - -  Managing your Medications? - -  Managing your Finances? - -  Housekeeping or managing your Housekeeping? - -    Fall/Depression Screening:    PHQ 2/9 Scores 01/22/2015 12/21/2014  PHQ - 2 Score - 1  Exception Documentation (No Data) -    Assessment:  HF- edema present in bilateral lower legs/abdomine.  No c/o sob.  Continues on O 2 2.5 L [place]                          DM- per pt, blood sugar this am 212 (eating more), since discharge ranges 42- 298.   Plan:  Pt to f/u with all MD appointments post discharge           Pt to continue to weigh daily, call MD for weight gain of 3 lbs in a day/5 lbs in a week.            Pt to continue to cut down on smoking.           RN CM to follow pt for transition of care 30 days post discharge, then resume community nurse case               Services (monthly home  visits).           RN CM [facility] by [place] ([place].  As discussed with pt since [facility] services are involved, RN CM's next home visit scheduled for 9/8.   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 ]   [facility] Long Term Goal Start Date  04/25/15   Interventions for Problem One Long Term Goal  -- [Reviewed Emmi information- salt smart,when to call MD/911]   [facility] CM Short Term Goal #1 (0-30 days)  Pt weigh/report weight gain to MD in the next 30 days    [facility] CM Short Term Goal #1 Start Date  04/25/15   Interventions for Short Term Goal #1  -- [Reviewed call MD for wt gain of 3 lbs in a day/5 lbs in week]   [facility] CM Short Term Goal #2 (0-30 days)  -- [Pt able to have sleep study rescheduled in the next 30 days ]   [facility] CM Short Term Goal #2 Start Date  04/25/15   Interventions for Short Term Goal #2  Reinforced with pt to f/u with Primary Care MD- told CPAP will help swelling     [facility] CM Care Plan Problem Two        Patient Outreach from 01/22/2015 in [place] Problem Two  Recent hospitalization- HF    Care Plan for Problem Two  Not Active   Interventions for Problem Two Long Term Goal   Discussed  with pt plan to f/u with weekly phone calls  30 days post discharge as part of transition of care    [facility] Long Term Goal (31-90) days  Pt would not be readmitted in the next 31 days    [facility] Long Term Goal Start Date  11/09/14   [facility] Long Term Goal Met Date  12/06/14   [facility] CM Short Term Goal #1 (0-30 days)  pt would pick up new med from pharmacy in the next 1-2 days    [facility] CM Short Term Goal #1 Start Date  11/09/14   [facility] CM Short Term Goal #1 Met Date    11/16/14 [pt reported 2/26- had all of his meds. ]    [facility] CM Care Plan Problem Three        Patient Outreach from 04/25/2015 in [place] Problem Three  Self management of Heart failure    Care Plan for Problem Three  Active   [facility]N Long Term Goal (31-90) days  Pt would be more familiar with yellow zone in HF action plan  in the next 60 days    [facility] Long Term Goal Start Date  03/26/15   [facility] Long Term Goal Met Date  -- [not met- hospitalized 7/19-7/29, needs more review ]   Interventions for Problem Three Long Term Goal  Reviewed with pt the yellow zone in HF action plan as he has all the zones in [facility] calendar.   [facility] CM Short Term Goal #1 (0-30 days)  Pt would monitor intake of processed foods within the next 30 days    [facility] CM Short Term Goal #1 Start Date  03/26/15   [facility] CM Short Term Goal #1 Met Date  -- [not met- hospitalized 7/19- 7/29]  Interventions for Short Term Goal #1  Reviewed with pt processed foods high in sodium, ex. hot dogs, limit intake.    [facility] CM Short Term Goal #2 (0-30 days)  Pt would continue to cut down on cigar smoking in the next 30 days    [facility] CM Short Term Goal #2 Start Date  03/26/15   [facility] CM Short Term Goal #2 Met Date  -- [met- pt not smoked 2 weeks while in hospital,2 since D/C. ]   Interventions for Short Term Goal #2  Discussed with pt cigar smoking effects the heart as well as lungs.       Zara Chess.   [place] Care Management  [phone]

## 2015-04-29 ENCOUNTER — Encounter: Payer: Self-pay | Admitting: Emergency Medicine

## 2015-04-29 ENCOUNTER — Inpatient Hospital Stay
Admission: EM | Admit: 2015-04-29 | Discharge: 2015-05-03 | DRG: 292 | Disposition: A | Discharge status: Home / Self Care | Payer: Commercial Managed Care - HMO | Attending: Internal Medicine | Admitting: Internal Medicine

## 2015-04-29 ENCOUNTER — Emergency Department: Payer: Commercial Managed Care - HMO

## 2015-04-29 DIAGNOSIS — M109 Gout, unspecified: Secondary | ICD-10-CM | POA: Diagnosis present

## 2015-04-29 DIAGNOSIS — Z79891 Long term (current) use of opiate analgesic: Secondary | ICD-10-CM

## 2015-04-29 DIAGNOSIS — Z7901 Long term (current) use of anticoagulants: Secondary | ICD-10-CM

## 2015-04-29 DIAGNOSIS — E871 Hypo-osmolality and hyponatremia: Secondary | ICD-10-CM | POA: Diagnosis present

## 2015-04-29 DIAGNOSIS — J9611 Chronic respiratory failure with hypoxia: Secondary | ICD-10-CM | POA: Diagnosis present

## 2015-04-29 DIAGNOSIS — E1122 Type 2 diabetes mellitus with diabetic chronic kidney disease: Secondary | ICD-10-CM | POA: Diagnosis present

## 2015-04-29 DIAGNOSIS — I129 Hypertensive chronic kidney disease with stage 1 through stage 4 chronic kidney disease, or unspecified chronic kidney disease: Secondary | ICD-10-CM | POA: Diagnosis present

## 2015-04-29 DIAGNOSIS — R188 Other ascites: Secondary | ICD-10-CM | POA: Diagnosis present

## 2015-04-29 DIAGNOSIS — J449 Chronic obstructive pulmonary disease, unspecified: Secondary | ICD-10-CM | POA: Diagnosis present

## 2015-04-29 DIAGNOSIS — N183 Chronic kidney disease, stage 3 (moderate): Secondary | ICD-10-CM | POA: Diagnosis present

## 2015-04-29 DIAGNOSIS — Z9109 Other allergy status, other than to drugs and biological substances: Secondary | ICD-10-CM | POA: Diagnosis not present

## 2015-04-29 DIAGNOSIS — Z7951 Long term (current) use of inhaled steroids: Secondary | ICD-10-CM | POA: Diagnosis not present

## 2015-04-29 DIAGNOSIS — I480 Paroxysmal atrial fibrillation: Secondary | ICD-10-CM | POA: Diagnosis present

## 2015-04-29 DIAGNOSIS — Z515 Encounter for palliative care: Secondary | ICD-10-CM | POA: Diagnosis not present

## 2015-04-29 DIAGNOSIS — I5033 Acute on chronic diastolic (congestive) heart failure: Secondary | ICD-10-CM | POA: Diagnosis present

## 2015-04-29 DIAGNOSIS — Z6841 Body Mass Index (BMI) 40.0 and over, adult: Secondary | ICD-10-CM | POA: Diagnosis not present

## 2015-04-29 DIAGNOSIS — G4733 Obstructive sleep apnea (adult) (pediatric): Secondary | ICD-10-CM | POA: Diagnosis present

## 2015-04-29 DIAGNOSIS — Z79899 Other long term (current) drug therapy: Secondary | ICD-10-CM | POA: Diagnosis not present

## 2015-04-29 DIAGNOSIS — F17210 Nicotine dependence, cigarettes, uncomplicated: Secondary | ICD-10-CM | POA: Diagnosis present

## 2015-04-29 DIAGNOSIS — J45909 Unspecified asthma, uncomplicated: Secondary | ICD-10-CM | POA: Diagnosis present

## 2015-04-29 DIAGNOSIS — N179 Acute kidney failure, unspecified: Secondary | ICD-10-CM | POA: Diagnosis present

## 2015-04-29 DIAGNOSIS — I272 Other secondary pulmonary hypertension: Secondary | ICD-10-CM | POA: Diagnosis present

## 2015-04-29 DIAGNOSIS — Z794 Long term (current) use of insulin: Secondary | ICD-10-CM | POA: Diagnosis not present

## 2015-04-29 DIAGNOSIS — I313 Pericardial effusion (noninflammatory): Secondary | ICD-10-CM | POA: Diagnosis present

## 2015-04-29 DIAGNOSIS — J90 Pleural effusion, not elsewhere classified: Secondary | ICD-10-CM

## 2015-04-29 DIAGNOSIS — R601 Generalized edema: Secondary | ICD-10-CM | POA: Diagnosis not present

## 2015-04-29 DIAGNOSIS — I10 Essential (primary) hypertension: Secondary | ICD-10-CM | POA: Diagnosis not present

## 2015-04-29 DIAGNOSIS — Z86718 Personal history of other venous thrombosis and embolism: Secondary | ICD-10-CM | POA: Diagnosis not present

## 2015-04-29 DIAGNOSIS — Z9111 Patient's noncompliance with dietary regimen: Secondary | ICD-10-CM | POA: Diagnosis present

## 2015-04-29 DIAGNOSIS — E039 Hypothyroidism, unspecified: Secondary | ICD-10-CM | POA: Diagnosis present

## 2015-04-29 DIAGNOSIS — E1140 Type 2 diabetes mellitus with diabetic neuropathy, unspecified: Secondary | ICD-10-CM | POA: Diagnosis present

## 2015-04-29 DIAGNOSIS — E785 Hyperlipidemia, unspecified: Secondary | ICD-10-CM | POA: Diagnosis present

## 2015-04-29 DIAGNOSIS — I509 Heart failure, unspecified: Secondary | ICD-10-CM

## 2015-04-29 DIAGNOSIS — Z833 Family history of diabetes mellitus: Secondary | ICD-10-CM

## 2015-04-29 LAB — BASIC METABOLIC PANEL WITH GFR
Anion gap: 11 (ref 5–15)
BUN: 36 mg/dL — ABNORMAL HIGH (ref 6–20)
CO2: 28 mmol/L (ref 22–32)
Calcium: 8.7 mg/dL — ABNORMAL LOW (ref 8.9–10.3)
Chloride: 89 mmol/L — ABNORMAL LOW (ref 101–111)
Creatinine, Ser: 1.81 mg/dL — ABNORMAL HIGH (ref 0.61–1.24)
GFR calc Af Amer: 45 mL/min — ABNORMAL LOW
GFR calc non Af Amer: 39 mL/min — ABNORMAL LOW
Glucose, Bld: 181 mg/dL — ABNORMAL HIGH (ref 65–99)
Potassium: 4.3 mmol/L (ref 3.5–5.1)
Sodium: 128 mmol/L — ABNORMAL LOW (ref 135–145)

## 2015-04-29 LAB — PROTIME-INR
INR: 1.57
Prothrombin Time: 19 s — ABNORMAL HIGH (ref 11.4–15.0)

## 2015-04-29 LAB — CBC
HCT: 31.9 % — ABNORMAL LOW (ref 40.0–52.0)
HEMATOCRIT: 31.3 % — AB (ref 40.0–52.0)
Hemoglobin: 10.5 g/dL — ABNORMAL LOW (ref 13.0–18.0)
Hemoglobin: 10.2 g/dL — ABNORMAL LOW (ref 13.0–18.0)
MCH: 26.8 pg (ref 26.0–34.0)
MCH: 27 pg (ref 26.0–34.0)
MCHC: 32.8 g/dL (ref 32.0–36.0)
MCHC: 32.7 g/dL (ref 32.0–36.0)
MCV: 81.9 fL (ref 80.0–100.0)
MCV: 82.5 fL (ref 80.0–100.0)
PLATELETS: 230 10*3/uL (ref 150–440)
Platelets: 249 K/uL (ref 150–440)
RBC: 3.9 MIL/uL — ABNORMAL LOW (ref 4.40–5.90)
RBC: 3.79 MIL/uL — AB (ref 4.40–5.90)
RDW: 17.5 % — ABNORMAL HIGH (ref 11.5–14.5)
RDW: 16.6 % — ABNORMAL HIGH (ref 11.5–14.5)
WBC: 6.4 K/uL (ref 3.8–10.6)
WBC: 5.1 10*3/uL (ref 3.8–10.6)

## 2015-04-29 LAB — TROPONIN I
Troponin I: <0.03 ng/mL (ref <0.031)
Troponin I: <0.03 ng/mL (ref <0.031)
Troponin I: <0.03 ng/mL (ref <0.031)

## 2015-04-29 LAB — GLUCOSE, CAPILLARY: Glucose-Capillary: 179 mg/dL — ABNORMAL HIGH (ref 65–99)

## 2015-04-29 LAB — CREATININE, SERUM
Creatinine, Ser: 1.72 mg/dL — ABNORMAL HIGH (ref 0.61–1.24)
GFR calc Af Amer: 48 mL/min — ABNORMAL LOW (ref >60)
GFR calc non Af Amer: 42 mL/min — ABNORMAL LOW (ref >60)

## 2015-04-29 MED ORDER — POTASSIUM CHLORIDE ER 10 MEQ PO TBCR
20.0000 meq | EXTENDED_RELEASE_TABLET | Freq: Every day | ORAL | Status: DC
  Administered 2015-04-30: 20 meq via ORAL
  Filled 2015-04-29 (×4): qty 2

## 2015-04-29 MED ORDER — NYSTATIN 100000 UNIT/GM EX POWD
1.0000 g | Freq: Every day | CUTANEOUS | Status: DC | PRN
  Filled 2015-04-29: qty 15

## 2015-04-29 MED ORDER — ONDANSETRON HCL 4 MG PO TABS
4.0000 mg | ORAL_TABLET | Freq: Four times a day (QID) | ORAL | Status: DC | PRN
Start: 2015-04-29 — End: 2015-05-03

## 2015-04-29 MED ORDER — TIOTROPIUM BROMIDE-OLODATEROL 2.5-2.5 MCG/ACT IN AERS: 1.0000 | INHALATION_SPRAY | Freq: Every day | RESPIRATORY_TRACT | Status: DC | PRN

## 2015-04-29 MED ORDER — PAROXETINE HCL 10 MG PO TABS
20.0000 mg | ORAL_TABLET | Freq: Every day | ORAL | Status: DC
  Administered 2015-04-30 – 2015-05-03 (×4): 20 mg via ORAL
  Filled 2015-04-29 (×5): qty 2

## 2015-04-29 MED ORDER — POLYETHYLENE GLYCOL 3350 17 G PO PACK
17.0000 g | PACK | Freq: Every day | ORAL | Status: DC
  Administered 2015-05-01 – 2015-05-02 (×2): 17 g via ORAL
  Filled 2015-04-29 (×3): qty 1

## 2015-04-29 MED ORDER — WARFARIN SODIUM 5 MG PO TABS
10.0000 mg | ORAL_TABLET | ORAL | Status: DC
  Administered 2015-04-29: 10 mg via ORAL
  Filled 2015-04-29: qty 2

## 2015-04-29 MED ORDER — LACTULOSE 10 GM/15ML PO SOLN: 30.0000 g | Freq: Two times a day (BID) | ORAL | Status: DC | PRN

## 2015-04-29 MED ORDER — PRAVASTATIN SODIUM 20 MG PO TABS
40.0000 mg | ORAL_TABLET | Freq: Every day | ORAL | Status: DC
  Administered 2015-04-29 – 2015-05-02 (×4): 40 mg via ORAL
  Filled 2015-04-29 (×4): qty 2

## 2015-04-29 MED ORDER — FUROSEMIDE 10 MG/ML IJ SOLN
40.0000 mg | Freq: Two times a day (BID) | INTRAMUSCULAR | Status: DC
  Administered 2015-04-30: 40 mg via INTRAVENOUS
  Filled 2015-04-29: qty 4

## 2015-04-29 MED ORDER — TAMSULOSIN HCL 0.4 MG PO CAPS
0.4000 mg | ORAL_CAPSULE | Freq: Every day | ORAL | Status: DC
  Administered 2015-04-30 – 2015-05-03 (×4): 0.4 mg via ORAL
  Filled 2015-04-29 (×4): qty 1

## 2015-04-29 MED ORDER — ONDANSETRON HCL 4 MG PO TABS: 4.0000 mg | ORAL_TABLET | Freq: Four times a day (QID) | ORAL | Status: DC | PRN

## 2015-04-29 MED ORDER — INSULIN ASPART 100 UNIT/ML ~~LOC~~ SOLN
10.0000 [IU] | Freq: Two times a day (BID) | SUBCUTANEOUS | Status: DC
Start: 2015-04-30 — End: 2015-04-30
  Administered 2015-04-30: 10 [IU] via SUBCUTANEOUS
  Filled 2015-04-29: qty 10

## 2015-04-29 MED ORDER — MIRTAZAPINE 30 MG PO TABS
30.0000 mg | ORAL_TABLET | Freq: Every day | ORAL | Status: DC
  Administered 2015-04-29 – 2015-05-02 (×4): 30 mg via ORAL
  Filled 2015-04-29 (×4): qty 1

## 2015-04-29 MED ORDER — CYANOCOBALAMIN 1000 MCG/ML IJ SOLN
1000.0000 ug | INTRAMUSCULAR | Status: DC
  Filled 2015-04-29: qty 1

## 2015-04-29 MED ORDER — ACETAMINOPHEN 325 MG PO TABS
650.0000 mg | ORAL_TABLET | Freq: Four times a day (QID) | ORAL | Status: DC | PRN
  Administered 2015-04-29 – 2015-05-01 (×3): 650 mg via ORAL
  Filled 2015-04-29 (×3): qty 2

## 2015-04-29 MED ORDER — ONDANSETRON HCL 4 MG/2ML IJ SOLN: 4.0000 mg | Freq: Four times a day (QID) | INTRAMUSCULAR | Status: DC | PRN

## 2015-04-29 MED ORDER — SODIUM CHLORIDE 0.9 % IJ SOLN
3.0000 mL | Freq: Two times a day (BID) | INTRAMUSCULAR | Status: DC
  Administered 2015-04-29 – 2015-05-01 (×4): 3 mL via INTRAVENOUS

## 2015-04-29 MED ORDER — WARFARIN - PHYSICIAN DOSING INPATIENT
Freq: Every day | Status: DC
  Administered 2015-04-30: 18:00:00

## 2015-04-29 MED ORDER — SALMETEROL XINAFOATE 50 MCG/DOSE IN AEPB
1.0000 | INHALATION_SPRAY | Freq: Two times a day (BID) | RESPIRATORY_TRACT | Status: DC
  Administered 2015-04-30 – 2015-05-03 (×5): 1 via RESPIRATORY_TRACT
  Filled 2015-04-29 (×4): qty 0

## 2015-04-29 MED ORDER — WARFARIN SODIUM 5 MG PO TABS
5.0000 mg | ORAL_TABLET | ORAL | Status: DC
  Administered 2015-04-30 – 2015-05-02 (×3): 5 mg via ORAL
  Filled 2015-04-29 (×4): qty 1

## 2015-04-29 MED ORDER — VITAMIN B-12 1000 MCG PO TABS
2000.0000 ug | ORAL_TABLET | Freq: Every day | ORAL | Status: DC
  Administered 2015-04-30 – 2015-05-03 (×4): 2000 ug via ORAL
  Filled 2015-04-29 (×4): qty 2

## 2015-04-29 MED ORDER — ACETAMINOPHEN 650 MG RE SUPP: 650.0000 mg | Freq: Four times a day (QID) | RECTAL | Status: DC | PRN

## 2015-04-29 MED ORDER — IPRATROPIUM-ALBUTEROL 0.5-2.5 (3) MG/3ML IN SOLN: 3.0000 mL | RESPIRATORY_TRACT | Status: DC | PRN

## 2015-04-29 MED ORDER — GABAPENTIN 100 MG PO CAPS
100.0000 mg | ORAL_CAPSULE | Freq: Two times a day (BID) | ORAL | Status: DC
  Administered 2015-04-29 – 2015-05-03 (×8): 100 mg via ORAL
  Filled 2015-04-29 (×8): qty 1

## 2015-04-29 MED ORDER — LEVOTHYROXINE SODIUM 100 MCG PO TABS
100.0000 ug | ORAL_TABLET | Freq: Every day | ORAL | Status: DC
  Administered 2015-04-30 – 2015-05-03 (×4): 100 ug via ORAL
  Filled 2015-04-29 (×4): qty 1

## 2015-04-29 MED ORDER — FUROSEMIDE 10 MG/ML IJ SOLN
80.0000 mg | Freq: Once | INTRAMUSCULAR | Status: AC
  Administered 2015-04-29: 80 mg via INTRAVENOUS
  Filled 2015-04-29: qty 8

## 2015-04-29 MED ORDER — SPIRONOLACTONE 25 MG PO TABS
25.0000 mg | ORAL_TABLET | Freq: Every day | ORAL | Status: DC
  Administered 2015-04-30 – 2015-05-03 (×4): 25 mg via ORAL
  Filled 2015-04-29 (×4): qty 1

## 2015-04-29 MED ORDER — COLCHICINE 0.6 MG PO TABS: 0.6000 mg | ORAL_TABLET | Freq: Two times a day (BID) | ORAL | Status: DC | PRN

## 2015-04-29 MED ORDER — INSULIN DETEMIR 100 UNIT/ML ~~LOC~~ SOLN
80.0000 [IU] | Freq: Two times a day (BID) | SUBCUTANEOUS | Status: DC
Start: 2015-04-30 — End: 2015-04-30
  Administered 2015-04-30: 80 [IU] via SUBCUTANEOUS
  Filled 2015-04-29 (×4): qty 0.8

## 2015-04-29 MED ORDER — HEPARIN SODIUM (PORCINE) 5000 UNIT/ML IJ SOLN
5000.0000 [IU] | Freq: Three times a day (TID) | INTRAMUSCULAR | Status: DC
Start: 2015-04-29 — End: 2015-05-03
  Administered 2015-04-30: 5000 [IU] via SUBCUTANEOUS
  Filled 2015-04-29 (×4): qty 1

## 2015-04-29 NOTE — ED Notes (Signed)
On RN entrance to room pt appears SOB, irritable, family/spouse at bedside. Pt reports he cannot sit in bed. RN introduced self and stated "Friend, we need to get you set up somewhere, I can help..." Pt immediately began yelling at RN "You're a sarcastic mother fucking son of a bitch" "I'll hit him" "I'm leaving." RN left room and sought alternative staff for patient.

## 2015-04-29 NOTE — H&P (Signed)
[facility] [place] at [facility]   PATIENT NAME: Tyler [name]    MR#:  [id number]  DATE OF BIRTH:  [birth year]  DATE OF ADMISSION:  04/29/2015  PRIMARY CARE PHYSICIAN: Marguarite Arbour, MD   REQUESTING/REFERRING PHYSICIAN: Dr. Minna Antis  CHIEF COMPLAINT:   Chief Complaint  Patient presents with  . Chest Pain  . Leg Swelling  . Shortness of Breath    HISTORY OF PRESENT ILLNESS:  Tyler [name]  is a 59 y.o. male with a known history of morbid obesity, CHF, type 2 diabetes without complication, obstructive sleep apnea, hypothyroidism, gout, history of chronic kidney disease stage III, hypertension, who presents to the hospital due to shortness of breath, chest pain and weight gain. As per the wife patient recently got discharged from the hospital but has not felt well since then. Today patient was complaining of some intermittent chest tightness along with shortness of breath and therefore called his primary care physician who advised him to come to the emergency room. Patient also says that he's had about a 30 pound weight gain over the past 2 weeks. He admits to cough which is nonproductive admission shortness of breath progressively getting worse and therefore came to the ER further evaluation. Patient was noted to be in acute CHF and hospitalist services were contacted for further treatment and evaluation.  PAST MEDICAL HISTORY:   Past Medical History  Diagnosis Date  . Cataract   . CHF (congestive heart failure)   . COPD (chronic obstructive pulmonary disease)   . Diabetes mellitus without complication   . Sleep apnea   . Oxygen deficiency   . Thyroid disease   . Gout   . Hypothyroidism   . CKD (chronic kidney disease) stage 3, GFR 30-59 ml/min   . Asthma   . Hypertension     PAST SURGICAL HISTORY:   Past Surgical History  Procedure Laterality Date  . Cholecystectomy      2002  . Eye surgery      right cataract removed  3/21  . Vena cava filter placement      SOCIAL HISTORY:   History  Substance Use Topics  . Smoking status: Current Every Day Smoker -- 2.00 packs/day for 30 years    Types: Cigarettes, Cigars  . Smokeless tobacco: Never Used     Comment: cigar 3-4 yrs 1-3 per day/former Cigarettes  . Alcohol Use: No    FAMILY HISTORY:   Family History  Problem Relation Age of Onset  . Diabetes Sister   . Diabetes Brother   . Deep vein thrombosis Mother   . CVA Father     DRUG ALLERGIES:   Allergies  Allergen Reactions  . Enalapril Cough  . Atorvastatin Palpitations  . Lovastatin Palpitations    REVIEW OF SYSTEMS:   Review of Systems  Constitutional: Negative for fever and weight loss.  HENT: Negative for congestion, nosebleeds and tinnitus.   Eyes: Negative for blurred vision, double vision and redness.  Respiratory: Positive for shortness of breath. Negative for cough, hemoptysis and wheezing.   Cardiovascular: Positive for orthopnea and leg swelling. Negative for chest pain and PND.  Gastrointestinal: Negative for nausea, vomiting, abdominal pain, diarrhea and melena.  Genitourinary: Negative for dysuria, urgency and hematuria.  Musculoskeletal: Negative for joint pain and falls.  Neurological: Positive for weakness (generalized). Negative for dizziness, tingling, sensory change, focal weakness, seizures and headaches.  Endo/Heme/Allergies: Negative for polydipsia. Does not bruise/bleed easily.  Psychiatric/Behavioral: Negative for depression and  memory loss. The patient is not nervous/anxious.     MEDICATIONS AT HOME:   Prior to Admission medications   Medication Sig Start Date End Date Taking? Authorizing Provider  colchicine 0.6 MG tablet Take 0.6 mg by mouth 2 (two) times daily as needed (for wrist pain).   Yes Historical Provider, MD  cyanocobalamin (,VITAMIN B-12,) 1000 MCG/ML injection Inject 1,000 mcg into the muscle every 30 (thirty) days.   Yes Historical Provider,  MD  furosemide (LASIX) 80 MG tablet Take 1 tablet (80 mg total) by mouth 2 (two) times daily. 04/19/15  Yes Marguarite Arbour, MD  gabapentin (NEURONTIN) 100 MG capsule Take 100 mg by mouth 2 (two) times daily.   Yes Historical Provider, MD  insulin NPH Human (HUMULIN N,NOVOLIN N) 100 UNIT/ML injection Inject 80 Units into the skin 2 (two) times daily before a meal.   Yes Historical Provider, MD  insulin regular (NOVOLIN R,HUMULIN R) 100 units/mL injection Inject 10 Units into the skin 2 (two) times daily before a meal.   Yes Historical Provider, MD  ipratropium-albuterol (DUONEB) 0.5-2.5 (3) MG/3ML SOLN Take 3 mLs by nebulization every 4 (four) hours as needed. Patient taking differently: Take 3 mLs by nebulization every 4 (four) hours as needed (for shortness of breath).  04/19/15  Yes Marguarite Arbour, MD  lactulose (CHRONULAC) 10 GM/15ML solution Take 30 g by mouth 2 (two) times daily as needed for mild constipation.    Yes Historical Provider, MD  levothyroxine (SYNTHROID, LEVOTHROID) 100 MCG tablet Take 100 mcg by mouth daily before breakfast.   Yes Historical Provider, MD  metolazone (ZAROXOLYN) 2.5 MG tablet Take 1 tablet (2.5 mg total) by mouth daily. 04/19/15  Yes Marguarite Arbour, MD  mirtazapine (REMERON) 30 MG tablet Take 1 tablet (30 mg total) by mouth at bedtime. 04/19/15  Yes Marguarite Arbour, MD  nystatin (MYCOSTATIN/NYSTOP) 100000 UNIT/GM POWD Apply 1 g topically daily as needed (for rash).   Yes Historical Provider, MD  ondansetron (ZOFRAN) 4 MG tablet Take 1 tablet (4 mg total) by mouth every 6 (six) hours as needed for nausea. 04/19/15  Yes Marguarite Arbour, MD  PARoxetine (PAXIL) 20 MG tablet Take 20 mg by mouth daily.   Yes Historical Provider, MD  polyethylene glycol (MIRALAX / GLYCOLAX) packet Take 17 g by mouth daily.   Yes Historical Provider, MD  potassium chloride (K-DUR) 10 MEQ tablet Take 20 mEq by mouth daily.   Yes Historical Provider, MD  pravastatin (PRAVACHOL) 40 MG  tablet Take 40 mg by mouth at bedtime.   Yes Historical Provider, MD  salmeterol (SEREVENT) 50 MCG/DOSE diskus inhaler Inhale 1 puff into the lungs every 12 (twelve) hours. 04/19/15  Yes Marguarite Arbour, MD  spironolactone (ALDACTONE) 25 MG tablet Take 25 mg by mouth daily.   Yes Historical Provider, MD  tamsulosin (FLOMAX) 0.4 MG CAPS capsule Take 1 capsule (0.4 mg total) by mouth daily. 04/19/15  Yes Marguarite Arbour, MD  Tiotropium Bromide-Olodaterol 2.5-2.5 MCG/ACT AERS Inhale 1 puff into the lungs daily as needed (for wheezing and shortness of breath).    Yes Historical Provider, MD  vitamin B-12 (CYANOCOBALAMIN) 1000 MCG tablet Take 2,000 mcg by mouth daily.   Yes Historical Provider, MD  warfarin (COUMADIN) 10 MG tablet Take 5-10 mg by mouth daily. Pt takes one tablet on Monday and Friday.   Pt takes one-half tablet on Tuesday, Wednesday, Thursday, Saturday, and Sunday.   Yes Historical Provider, MD  VITAL SIGNS:  Blood pressure 118/65, pulse 71, temperature 97.7 F (36.5 C), temperature source Oral, resp. rate 22, height 6' (1.829 m), weight 174.181 kg (384 lb), SpO2 88 %.  PHYSICAL EXAMINATION:  Physical Exam  GENERAL:  59 y.o.-year-old patient morbidly obese sitting up in chair in mild respiratory distress.  EYES: Pupils equal, round, reactive to light and accommodation. No scleral icterus. Extraocular muscles intact.  HEENT: Head atraumatic, normocephalic. Oropharynx and nasopharynx clear. No oropharyngeal erythema, moist oral mucosa  NECK:  Supple, no jugular venous distention. No thyroid enlargement, no tenderness.  LUNGS: Normal breath sounds bilaterally, no wheezing, basilar rales, No rhonchi. No use of accessory muscles of respiration.  CARDIOVASCULAR: S1, S2 RRR. No murmurs, rubs, gallops, clicks.  ABDOMEN: Soft, nontender, nondistended. Bowel sounds present. No organomegaly or mass.  EXTREMITIES: Pulse 2 pitting edema from the knees to ankles bilaterally, No cyanosis, or  clubbing. + 2 pedal & radial pulses b/l.   NEUROLOGIC: Cranial nerves II through XII are intact. No focal Motor or sensory deficits appreciated b/l.  Globally weak PSYCHIATRIC: The patient is alert and oriented x 3. Good affect.  SKIN: No obvious rash, lesion, or ulcer.   LABORATORY PANEL:   CBC  Recent Labs Lab 04/29/15 1203  WBC 6.4  HGB 10.5*  HCT 31.9*  PLT 249   ------------------------------------------------------------------------------------------------------------------  Chemistries   Recent Labs Lab 04/29/15 1203  NA 128*  K 4.3  CL 89*  CO2 28  GLUCOSE 181*  BUN 36*  CREATININE 1.81*  CALCIUM 8.7*   ------------------------------------------------------------------------------------------------------------------  Cardiac Enzymes  Recent Labs Lab 04/29/15 1203  TROPONINI <0.03   ------------------------------------------------------------------------------------------------------------------  RADIOLOGY:  Dg Chest Portable 1 View  04/29/2015   CLINICAL DATA:  Left-sided chest pain today.  EXAM: PORTABLE CHEST - 1 VIEW  COMPARISON:  04/17/2015  FINDINGS: Enlargement of the cardiac silhouette flow most likely due to the previously demonstrated pericardial effusion. There is slight pulmonary vascular congestion. Increased density at the left lung base is most likely secondary to an increasing left effusion. Small right effusion.  IMPRESSION: Increasing moderate left effusion. Small right effusion. Slight pulmonary vascular congestion.   Electronically Signed   By: Francene Boyers M.D.   On: 04/29/2015 12:40     IMPRESSION AND PLAN:   59 year old male with past medical history of morbid obesity, obstructive sleep apnea, diastolic CHF, hypothyroidism, diabetes, hypertension, COPD, who presents to the hospital due to shortness of breath and lower extremity swelling and weight gain and noted to be in CHF.  #1 CHF-acute on chronic diastolic dysfunction. -We'll  diurese the patient with IV Lasix, follow I's and O's, daily weights. -Patient's heart rate is a little bit on the slow side therefore he is not on beta blockers. Patient has chronic kidney disease and therefore is not on ACE inhibitor presently. [facility] consult cardiology.  #2 chronic kidney disease stage III-patient's creatinine close to baseline. I will follow creatinine with diuresis.  #3 COPD-no acute exacerbation. -Continue Spiriva, DuoNeb nebs as needed.  #4 type 2 diabetes with renal complication-continue NPH, Novolin with meals and follow blood sugars.  #5 diabetic neuropathy- continue Neurontin.  #6 hypothyroidism-continue Synthroid.  #7 gout-no acute attack, continue colchicine.  #8 hyperlipidemia-continue Pravachol.  #9 obstructive sleep apnea-continue CPAP.     All the records are reviewed and case discussed with ED provider. Management plans discussed with the patient, family and they are in agreement.  CODE STATUS:  Full  TOTAL TIME TAKING CARE OF THIS PATIENT:  45  minutes.    Houston Siren M.D on 04/29/2015 at 3:57 PM  Between 7am to 6pm - Pager - [phone]  After 6pm go to [email or web address] - password [email or web address] [facility]  [place] [facility]  Office  [phone]  CC: Primary care physician; Marguarite Arbour, MD

## 2015-04-29 NOTE — ED Notes (Signed)
Patient reports increase in weight since admission 2 weeks ago as well as increase in shortness of breath and chest "twinges".

## 2015-04-29 NOTE — ED Provider Notes (Signed)
[facility] Emergency Department Provider Note  Time seen: 1:57 PM  I have reviewed the triage vital signs and the nursing notes.   HISTORY  Chief Complaint Chest Pain; Leg Swelling; and Shortness of Breath    HPI Tyler [name] is a 59 y.o. male with a past medical history of CHF, COPD, diabetes, OSA, oxygen dependency, hypothyroidism, chronic kidney disease, hypertension, presents the emergency department with worsening shortness of breath. According to the patient he awoke this morning around 3 AM short of breath with occasional "twinges" of pain in his chest. He states the chest pain is intermittent, and fairly typical for him. More concerning to him with the shortness of breath. Patient wears oxygen at home 2.5 L at all times. Patient was recently discharged 04/19/15 after a 10 day admission for a CHF exacerbation with anasarca. Patient denies any nausea, diaphoresis. Denies any chest pain currently.    Past Medical History  Diagnosis Date  . Cataract   . CHF (congestive heart failure)   . COPD (chronic obstructive pulmonary disease)   . Diabetes mellitus without complication   . Sleep apnea   . Oxygen deficiency   . Thyroid disease   . Gout   . Hypothyroidism   . CKD (chronic kidney disease) stage 3, GFR 30-59 ml/min   . Asthma   . Hypertension     Patient Active Problem List   Diagnosis Date Noted  . Depression, major, recurrent, moderate 04/12/2015  . CHF (congestive heart failure) 04/10/2015  . Anasarca 04/09/2015  . COPD, moderate 03/12/2015  . Tobacco abuse 03/12/2015  . Acute CHF 02/16/2015  . Hyponatremia 02/16/2015  . A-fib 02/16/2015  . Chronic respiratory failure 02/16/2015    Past Surgical History  Procedure Laterality Date  . Cholecystectomy      2002  . Eye surgery      right cataract removed 3/21  . Vena cava filter placement      Current Outpatient Rx  Name  Route  Sig  Dispense  Refill  . colchicine 0.6 MG  tablet      0.6 mg 2 (two) times daily. TAKE ONE TABLET BY MOUTH TWICE DAILY AS NEEDED FOR  WRIST  PAIN         . cyanocobalamin (,VITAMIN B-12,) 1000 MCG/ML injection   Intramuscular   Inject 1,000 mcg into the muscle every 30 (thirty) days.         . furosemide (LASIX) 80 MG tablet   Oral   Take 1 tablet (80 mg total) by mouth 2 (two) times daily.   60 tablet   5   . gabapentin (NEURONTIN) 100 MG capsule   Oral   Take 100 mg by mouth 2 (two) times daily.          . insulin NPH Human (HUMULIN N,NOVOLIN N) 100 UNIT/ML injection   Subcutaneous   Inject 80 Units into the skin 2 (two) times daily before a meal. 80 units subcutaneous 30 min prior to breakfast, 80 units subcutaneous 30 min prior to supper.         . insulin regular (NOVOLIN R,HUMULIN R) 100 units/mL injection   Subcutaneous   Inject 10 Units into the skin 2 (two) times daily before a meal. 10 units 30 min prior to meal if BG >120 & eating         . ipratropium-albuterol (DUONEB) 0.5-2.5 (3) MG/3ML SOLN   Nebulization   Take 3 mLs by nebulization every 4 (four) hours  as needed.   360 mL   5   . lactulose (CHRONULAC) 10 GM/15ML solution   Oral   Take 30 g by mouth 2 (two) times daily as needed for mild constipation or moderate constipation.         Marland Kitchen levothyroxine (SYNTHROID, LEVOTHROID) 100 MCG tablet   Oral   Take 100 mcg by mouth daily. Patient takes this medication at 6am.         . metolazone (ZAROXOLYN) 2.5 MG tablet   Oral   Take 1 tablet (2.5 mg total) by mouth daily.   30 tablet   5   . mirtazapine (REMERON) 30 MG tablet   Oral   Take 1 tablet (30 mg total) by mouth at bedtime.   30 tablet   5   . nystatin (MYCOSTATIN) powder   Topical   Apply 1 g topically daily as needed (for rash.).          Marland Kitchen ondansetron (ZOFRAN) 4 MG tablet   Oral   Take 1 tablet (4 mg total) by mouth every 6 (six) hours as needed for nausea. Patient not taking: Reported on 04/25/2015   20 tablet   0    . PARoxetine (PAXIL) 20 MG tablet   Oral   Take 20 mg by mouth daily.          . polyethylene glycol (MIRALAX / GLYCOLAX) packet   Oral   Take 17 g by mouth daily.   14 each   0   . potassium chloride (K-DUR,KLOR-CON) 10 MEQ tablet   Oral   Take 20 mEq by mouth daily. TAKE TWO TABLETS BY MOUTH ONCE DAILY         . pravastatin (PRAVACHOL) 40 MG tablet   Oral   Take 40 mg by mouth at bedtime.          . salmeterol (SEREVENT) 50 MCG/DOSE diskus inhaler   Inhalation   Inhale 1 puff into the lungs every 12 (twelve) hours.   1 Inhaler   12   . spironolactone (ALDACTONE) 25 MG tablet   Oral   Take 25 mg by mouth daily.          . tamsulosin (FLOMAX) 0.4 MG CAPS capsule   Oral   Take 1 capsule (0.4 mg total) by mouth daily.   30 capsule   5   . Tiotropium Bromide-Olodaterol 2.5-2.5 MCG/ACT AERS   Inhalation   Inhale 1 spray into the lungs daily as needed (for wheezing and shortness of breath.).         Marland Kitchen vitamin B-12 (CYANOCOBALAMIN) 1000 MCG tablet   Oral   Take 2,000 mcg by mouth daily.         Marland Kitchen warfarin (COUMADIN) 10 MG tablet   Oral   Take 5-10 mg by mouth daily. Pt states 1 tablet orally Monday and Friday.  Take 1/2 tablet ( ) orally Tuesday, Wednesday, Thursday, Saturday, and Sunday.           Allergies Enalapril; Lovastatin; and Atorvastatin  Family History  Problem Relation Age of Onset  . Diabetes Sister   . Diabetes Brother     Social History History  Substance Use Topics  . Smoking status: Current Every Day Smoker -- 2.00 packs/day for 30 years    Types: Cigarettes, Cigars  . Smokeless tobacco: Never Used     Comment: cigar 3-4 yrs 1-3 per day/former Cigarettes  . Alcohol Use: No    Review of Systems Constitutional: Negative  for fever. Cardiovascular: Intermittent chest pain. Respiratory: Positive for shortness of breath. Gastrointestinal: Negative for abdominal pain. Positive for abdominal swelling/fluid Skin: Negative for  rash. Neurological: Negative for headache 10-point ROS otherwise negative.  ____________________________________________   PHYSICAL EXAM:  VITAL SIGNS: ED Triage Vitals  Enc Vitals Group     BP 04/29/15 1121 118/65 mmHg     Pulse Rate 04/29/15 1121 71     Resp 04/29/15 1121 22     Temp 04/29/15 1121 97.7 F (36.5 C)     Temp Source 04/29/15 1121 Oral     SpO2 04/29/15 1121 88 %     Weight 04/29/15 1121 384 lb (174.181 kg)     Height 04/29/15 1121 6' (1.829 m)     Head Cir --      Peak Flow --      Pain Score --      Pain Loc --      Pain Edu? --      Excl. in GC? --     Constitutional: Alert and oriented. Well appearing and in no distress. Eyes: Normal exam ENT   Head: Normocephalic and atraumatic Cardiovascular: Normal rate, regular rhythm. No murmur Respiratory: Decreased breath sounds bilaterally. No obvious wheezes/rales/rhonchi. Gastrointestinal: Distended, dull percussion, consistent with fluid/anasarca. No tenderness. Musculoskeletal: Extremely swollen lower extremities, which the patient states is largely normal for him. Neurologic:  Normal speech and language. No gross focal neurologic deficits  Skin:  Skin is warm Psychiatric: Mood and affect are normal. Speech and behavior are normal. Patient exhibits appropriate insight and judgment.  ____________________________________________    EKG  EKG reviewed and interpreted by myself shows possible atrial fibrillation at 65 bpm. Widened QRS at 122 ms, nonspecific ST changes are present. No see elevations noted.  ____________________________________________    RADIOLOGY  Increased left pleural effusion, small right pleural effusion pulmonary vascular congestion is present.  ____________________________________________    INITIAL IMPRESSION / ASSESSMENT AND PLAN / ED COURSE  Pertinent labs & imaging results that were available during my care of the patient were reviewed by me and considered in my  medical decision making (see chart for details).  Patient with signs and symptoms most suggestive of a congestive heart failure exacerbation. Chest x-ray consistent with a moderate left-sided pleural effusion as well. Patient's shortness of breath likely results from a combination of vascular congestion and his left pleural effusion, both of which are likely due to to his congestive heart failure. Patient also has a lower sodium of 128, likely as a result of fluid overload. I discussed with the patient admission to the hospital for diuresis. As the patient was just admitted for 10 days, he is asking if he can go home. I will discuss the patient with his primary care doctor Dr. Judithann Sheen.  Unable to reach Dr. Judithann Sheen. Given the patient's hyponatremia, and large pleural effusion, pulmonary vascular congestion and shortness of breath especially with exertion will begin IV diuresis and the patient and admit for further treatment.  ____________________________________________   FINAL CLINICAL IMPRESSION(S) / ED DIAGNOSES  Congestive heart failure exacerbation Dyspnea Pleural effusion   Minna Antis, MD 04/29/15 1456

## 2015-04-29 NOTE — ED Notes (Signed)
Pt presents c/o sob since 3AM. States that he was admitted 2 weeks ago for CHF exacerbation. He has significant swelling to both lower extremities and his abdomen is tight and distended. Pt on 2.5 L at home, increased to 3 this morning due to sob.

## 2015-04-30 LAB — BASIC METABOLIC PANEL
ANION GAP: 12 (ref 5–15)
BUN: 37 mg/dL — ABNORMAL HIGH (ref 6–20)
CALCIUM: 8.4 mg/dL — AB (ref 8.9–10.3)
CO2: 29 mmol/L (ref 22–32)
CREATININE: 1.81 mg/dL — AB (ref 0.61–1.24)
Chloride: 91 mmol/L — ABNORMAL LOW (ref 101–111)
GFR calc non Af Amer: 39 mL/min — ABNORMAL LOW (ref >60)
GFR, EST AFRICAN AMERICAN: 45 mL/min — AB (ref >60)
GLUCOSE: 190 mg/dL — AB (ref 65–99)
POTASSIUM: 3.5 mmol/L (ref 3.5–5.1)
Sodium: 132 mmol/L — ABNORMAL LOW (ref 135–145)

## 2015-04-30 LAB — CBC
HEMATOCRIT: 31.5 % — AB (ref 40.0–52.0)
Hemoglobin: 10.4 g/dL — ABNORMAL LOW (ref 13.0–18.0)
MCH: 27 pg (ref 26.0–34.0)
MCHC: 33.1 g/dL (ref 32.0–36.0)
MCV: 81.4 fL (ref 80.0–100.0)
Platelets: 248 10*3/uL (ref 150–440)
RBC: 3.87 MIL/uL — AB (ref 4.40–5.90)
RDW: 17 % — AB (ref 11.5–14.5)
WBC: 5.6 10*3/uL (ref 3.8–10.6)

## 2015-04-30 LAB — GLUCOSE, CAPILLARY
GLUCOSE-CAPILLARY: 144 mg/dL — AB (ref 65–99)
GLUCOSE-CAPILLARY: 75 mg/dL (ref 65–99)
Glucose-Capillary: 170 mg/dL — ABNORMAL HIGH (ref 65–99)
Glucose-Capillary: 81 mg/dL (ref 65–99)

## 2015-04-30 LAB — TROPONIN I

## 2015-04-30 MED ORDER — FUROSEMIDE 10 MG/ML IJ SOLN
4.0000 mg/h | INTRAMUSCULAR | Status: DC
  Administered 2015-04-30 – 2015-05-03 (×2): 4 mg/h via INTRAVENOUS
  Filled 2015-04-30 (×2): qty 25

## 2015-04-30 MED ORDER — INSULIN ASPART 100 UNIT/ML ~~LOC~~ SOLN
0.0000 [IU] | Freq: Three times a day (TID) | SUBCUTANEOUS | Status: DC
  Administered 2015-04-30: 2 [IU] via SUBCUTANEOUS
  Administered 2015-05-01 (×2): 1 [IU] via SUBCUTANEOUS
  Administered 2015-05-02 (×2): 2 [IU] via SUBCUTANEOUS
  Administered 2015-05-02: 3 [IU] via SUBCUTANEOUS
  Filled 2015-04-30: qty 3
  Filled 2015-04-30 (×2): qty 2
  Filled 2015-04-30: qty 1
  Filled 2015-04-30: qty 2
  Filled 2015-04-30: qty 1

## 2015-04-30 NOTE — Progress Notes (Signed)
Pt rested well during shift.  Pt complained of a tension headache at start of shift, tylenol given. Pt voiding in the urinal, no further complaints. Afib on the monitor. VSS. Pt tried to wear cpap during the night but it was not comfortable.  Will continue to monitor. Louis Meckel

## 2015-04-30 NOTE — Progress Notes (Signed)
MD Judithann Sheen was notified of FS 24, order was to d/c novolog 10 units and levemir. Keep SSI Novolog only. No further orders.

## 2015-04-30 NOTE — Consult Note (Signed)
[facility] Cardiology Consultation Note  Patient ID: Tyler [name], MRN: [id number], DOB/AGE: 60-29-1957 60 y.o. Admit date: 04/29/2015   Date of Consult: 04/30/2015 Primary Physician: Marguarite Arbour, MD Primary Cardiologist: Gwen Pounds  Chief Complaint:  Chief Complaint  Patient presents with  . Chest Pain  . Leg Swelling  . Shortness of Breath   Reason for Consult: shortness of breath  HPI: 59 y.o. male with known severe sleep apnea without appropriate treatment over the last many years with severe pulmonary hypertension chronic obstructive pulmonary disease causing right kidney disease stage III with her continued significant weight gain. The patient has had chronic diastolic dysfunction congestive heart failure for which the failure to have any reasonable diuresis with his last emergency room and hospital visit. The patient was given intravenous Lasix but no improvements. He also was treated for oxygenation issues as well as sleep apnea with a BiPAP machine which did improve some of his issues but when he went home he had no treatment of sleep apnea and overnight hypoxia and continued to have shortness of breath and continued weight gain. The patient therefore may need further advanced care for improvements of his issues  Past Medical History  Diagnosis Date  . Cataract   . CHF (congestive heart failure)   . COPD (chronic obstructive pulmonary disease)   . Diabetes mellitus without complication   . Sleep apnea   . Oxygen deficiency   . Thyroid disease   . Gout   . Hypothyroidism   . CKD (chronic kidney disease) stage 3, GFR 30-59 ml/min   . Asthma   . Hypertension       Surgical History:  Past Surgical History  Procedure Laterality Date  . Cholecystectomy      2002  . Eye surgery      right cataract removed 3/21  . Vena cava filter placement       Home Meds: Prior to Admission medications   Medication Sig Start Date End Date Taking? Authorizing Provider   colchicine 0.6 MG tablet Take 0.6 mg by mouth 2 (two) times daily as needed (for wrist pain).   Yes Historical Provider, MD  cyanocobalamin (,VITAMIN B-12,) 1000 MCG/ML injection Inject 1,000 mcg into the muscle every 30 (thirty) days.   Yes Historical Provider, MD  furosemide (LASIX) 80 MG tablet Take 1 tablet (80 mg total) by mouth 2 (two) times daily. 04/19/15  Yes Marguarite Arbour, MD  gabapentin (NEURONTIN) 100 MG capsule Take 100 mg by mouth 2 (two) times daily.   Yes Historical Provider, MD  insulin NPH Human (HUMULIN N,NOVOLIN N) 100 UNIT/ML injection Inject 80 Units into the skin 2 (two) times daily before a meal.   Yes Historical Provider, MD  insulin regular (NOVOLIN R,HUMULIN R) 100 units/mL injection Inject 10 Units into the skin 2 (two) times daily before a meal.   Yes Historical Provider, MD  ipratropium-albuterol (DUONEB) 0.5-2.5 (3) MG/3ML SOLN Take 3 mLs by nebulization every 4 (four) hours as needed. Patient taking differently: Take 3 mLs by nebulization every 4 (four) hours as needed (for shortness of breath).  04/19/15  Yes Marguarite Arbour, MD  lactulose (CHRONULAC) 10 GM/15ML solution Take 30 g by mouth 2 (two) times daily as needed for mild constipation.    Yes Historical Provider, MD  levothyroxine (SYNTHROID, LEVOTHROID) 100 MCG tablet Take 100 mcg by mouth daily before breakfast.   Yes Historical Provider, MD  metolazone (ZAROXOLYN) 2.5 MG tablet Take 1 tablet (2.5 mg total)  by mouth daily. 04/19/15  Yes Marguarite Arbour, MD  mirtazapine (REMERON) 30 MG tablet Take 1 tablet (30 mg total) by mouth at bedtime. 04/19/15  Yes Marguarite Arbour, MD  nystatin (MYCOSTATIN/NYSTOP) 100000 UNIT/GM POWD Apply 1 g topically daily as needed (for rash).   Yes Historical Provider, MD  ondansetron (ZOFRAN) 4 MG tablet Take 1 tablet (4 mg total) by mouth every 6 (six) hours as needed for nausea. 04/19/15  Yes Marguarite Arbour, MD  PARoxetine (PAXIL) 20 MG tablet Take 20 mg by mouth daily.   Yes  Historical Provider, MD  polyethylene glycol (MIRALAX / GLYCOLAX) packet Take 17 g by mouth daily.   Yes Historical Provider, MD  potassium chloride (K-DUR) 10 MEQ tablet Take 20 mEq by mouth daily.   Yes Historical Provider, MD  pravastatin (PRAVACHOL) 40 MG tablet Take 40 mg by mouth at bedtime.   Yes Historical Provider, MD  salmeterol (SEREVENT) 50 MCG/DOSE diskus inhaler Inhale 1 puff into the lungs every 12 (twelve) hours. 04/19/15  Yes Marguarite Arbour, MD  spironolactone (ALDACTONE) 25 MG tablet Take 25 mg by mouth daily.   Yes Historical Provider, MD  tamsulosin (FLOMAX) 0.4 MG CAPS capsule Take 1 capsule (0.4 mg total) by mouth daily. 04/19/15  Yes Marguarite Arbour, MD  Tiotropium Bromide-Olodaterol 2.5-2.5 MCG/ACT AERS Inhale 1 puff into the lungs daily as needed (for wheezing and shortness of breath).    Yes Historical Provider, MD  vitamin B-12 (CYANOCOBALAMIN) 1000 MCG tablet Take 2,000 mcg by mouth daily.   Yes Historical Provider, MD  warfarin (COUMADIN) 10 MG tablet Take 5-10 mg by mouth daily. Pt takes one tablet on Monday and Friday.   Pt takes one-half tablet on Tuesday, Wednesday, Thursday, Saturday, and Sunday.   Yes Historical Provider, MD    Inpatient Medications:  . cyanocobalamin  1,000 mcg Intramuscular Q30 days  . gabapentin  100 mg Oral BID  . heparin  5,000 Units Subcutaneous 3 times per day  . insulin aspart  0-9 Units Subcutaneous TID WC  . insulin aspart  10 Units Subcutaneous BID AC  . insulin detemir  80 Units Subcutaneous BID AC  . levothyroxine  100 mcg Oral QAC breakfast  . mirtazapine  30 mg Oral QHS  . PARoxetine  20 mg Oral Daily  . polyethylene glycol  17 g Oral Daily  . potassium chloride  20 mEq Oral Daily  . pravastatin  40 mg Oral QHS  . salmeterol  1 puff Inhalation Q12H  . sodium chloride  3 mL Intravenous Q12H  . spironolactone  25 mg Oral Daily  . tamsulosin  0.4 mg Oral Daily  . vitamin B-12  2,000 mcg Oral Daily  . warfarin  10 mg Oral  Once per day on Mon Fri  . warfarin  5 mg Oral Once per day on Sun Tue Wed Thu Sat  . Warfarin - Physician Dosing Inpatient   Does not apply q1800   . furosemide (LASIX) infusion 4 mg/hr (04/30/15 1101)    Allergies:  Allergies  Allergen Reactions  . Enalapril Cough  . Atorvastatin Palpitations  . Lovastatin Palpitations    History   Social History  . Marital Status: Married    Spouse Name: N/A  . Number of Children: N/A  . Years of Education: N/A   Occupational History  . Not on file.   Social History Main Topics  . Smoking status: Current Every Day Smoker -- 2.00 packs/day for 30 years  Types: Cigarettes, Cigars  . Smokeless tobacco: Never Used     Comment: cigar 3-4 yrs 1-3 per day/former Cigarettes  . Alcohol Use: No  . Drug Use: No  . Sexual Activity: Not on file   Other Topics Concern  . Not on file   Social History Narrative     Family History  Problem Relation Age of Onset  . Diabetes Sister   . Diabetes Brother   . Deep vein thrombosis Mother   . CVA Father      Review of Systems Positive for shortness of breath Negative for: General:  chills, fever, night sweats or weight changes.  Cardiovascular: PND orthopnea syncope dizziness  Dermatological skin lesions rashes Respiratory: Cough congestion Urologic: Frequent urination urination at night and hematuria Abdominal: negative for nausea, vomiting, diarrhea, bright red blood per rectum, melena, or hematemesis Neurologic: negative for visual changes, and/or hearing changes  All other systems reviewed and are otherwise negative except as noted above.  Labs:  Recent Labs  04/29/15 1203 04/29/15 1915 04/29/15 2244 04/30/15 0236  TROPONINI <0.03 <0.03 <0.03 <0.03   Lab Results  Component Value Date   WBC 5.6 04/30/2015   HGB 10.4* 04/30/2015   HCT 31.5* 04/30/2015   MCV 81.4 04/30/2015   PLT 248 04/30/2015    Recent Labs Lab 04/30/15 0236  NA 132*  K 3.5  CL 91*  CO2 29  BUN  37*  CREATININE 1.81*  CALCIUM 8.4*  GLUCOSE 190*   No results found for: CHOL, HDL, LDLCALC, TRIG No results found for: DDIMER  Radiology/Studies:  Dg Chest 2 View  04/17/2015   CLINICAL DATA:  59 year old male with a history of COPD and chronic respiratory failure. Abdominal pain.  EXAM: CHEST - 2 VIEW  COMPARISON:  04/13/2015, 04/11/2015, 04/09/2015, CT chest 04/05/2015  FINDINGS: Cardiomediastinal silhouette unchanged. Left heart border partially obscured by overlying lung and pleural disease.  Fullness in the central hilar vessels, with interlobular septal thickening bilaterally.  Opacity at the left base appears similar to comparison plain film on both AP and lateral views.  Lucency at the margin of the cardiac silhouette on the lateral view, retrosternal window, worsened from the comparison plain film.  No displaced fracture.  Unremarkable appearance of the upper abdomen.  IMPRESSION: There appears to be enlarging pericardial effusion compared to the prior plain film. Correlation with echocardiography recommended to evaluate for cardiac output compromise given the history of respiratory failure.  Persisting low lung volumes with mild interstitial edema and likely atelectasis at the left base, or less likely pneumonia/pneumonitis.  These results were called by telephone at the time of interpretation on 04/17/2015 at 7:51 am to the nurse caring for the patient, Ms Dorita Fray, who verbally acknowledged these results.  Signed,  Yvone Neu. Loreta Ave, DO  [facility]  [facility]   Electronically Signed   By: Gilmer Mor D.O.   On: 04/17/2015 07:52   Dg Chest 2 View  04/13/2015   CLINICAL DATA:  History of CHF, no current chest complaints  EXAM: CHEST - 2 VIEW  COMPARISON:  04/11/2015  FINDINGS: Cardiac shadow remains enlarged. Vascular congestion is again identified with mild interstitial edema. Persistent left basilar opacity is noted. No new focal  abnormality is seen.  IMPRESSION: Stable CHF.  Persistent left basilar opacity without significant change from the prior exam.   Electronically Signed   By: Alcide Clever M.D.   On: 04/13/2015 08:18   Dg Chest 2 View  04/11/2015   CLINICAL DATA:  CHF, COPD -asthma, diabetes, morbid obesity.  EXAM: CHEST  2 VIEW  COMPARISON:  Portable chest x-ray of April 09, 2015  FINDINGS: The right lung is adequately inflated. There is no focal infiltrate. The interstitial markings are coarse and slightly more conspicuous today. On the left there is slight interval improvement in left lower lobe atelectasis or pneumonia. The left hemidiaphragm is faintly visible and the left heart border is slightly better visualized. A pleural effusion may be present. The trachea is midline. The cardiac silhouette where visualized is enlarged. The pulmonary vascularity is engorged.  IMPRESSION: 1. Slight interval improvement in aeration of the left lower lobe may reflect resolving atelectasis or pneumonia. 2. CHF with pulmonary interstitial edema slightly more conspicuous today.   Electronically Signed   By: David  [place] M.D.   On: 04/11/2015 07:41   Ct Chest Wo Contrast  04/05/2015   CLINICAL DATA:  Chronic respiratory failure  EXAM: CT CHEST WITHOUT CONTRAST  TECHNIQUE: Multidetector CT imaging of the chest was performed following the standard protocol without IV contrast.  COMPARISON:  None  FINDINGS: Mediastinum: The heart size is mildly enlarged. There is a small to moderate pericardial effusion noted. This is similar to 05/30/2014. Aortic atherosclerosis identified. The trachea appears patent and is midline. Normal appearance of the esophagus. Similar appearance of prominent mediastinal lymph nodes. The largest is in the high right paratracheal region measuring 11 mm, image 11/series 2.  Lungs/Pleura: There are small bilateral pleural effusions. Atelectasis is noted in the left lung day stress set atelectasis is identified within the  superior segment of both lower lobes. No lobar consolidation. Chronic mosaic attenuation pattern is identified in both lungs, with patchy areas of ground-glass attenuation in both lungs. The appearance is similar to previous exam. There is no airspace consolidation identified.  Upper Abdomen: No focal liver or splenic abnormality. There is moderate ascites within the upper abdomen. The adrenal glands are unremarkable.  Musculoskeletal: Mild multi level degenerative disc disease identified within the thoracic spine.  IMPRESSION: 1. Mild cardiac enlargement and pericardial effusion is again noted and appears similar to previous exam 2. Aortic atherosclerosis 3. Patchy areas of ground-glass attenuation are again identified in both lungs. This is a finding which is been present dating back to 2012 is likely related to underlying interstitial lung disease such as NSIP. 4. Small bilateral pleural effusions and  left base scarring.   Electronically Signed   By: Signa Kell M.D.   On: 04/05/2015 16:10   Ct Abdomen Pelvis W Contrast  04/09/2015   CLINICAL DATA:  Constipation and abdominal pain, 40 pound weight gain in last 2 months  EXAM: CT ABDOMEN AND PELVIS WITH CONTRAST  TECHNIQUE: Multidetector CT imaging of the abdomen and pelvis was performed using the standard protocol following bolus administration of intravenous contrast.  CONTRAST:  OMNIPAQUE IOHEXOL 350 MG/ML SOLN  COMPARISON:  CT scan 05/09/2014  FINDINGS: Lower chest: Moderate to large pericardial effusion, increased when compared to prior study. Small bilateral pleural effusions new or increased when compared to prior study. Visualized portions of the lung bases clear except for atelectasis left base.  Hepatobiliary: Status post cholecystectomy. No focal hepatic abnormalities.  Pancreas: Normal  Spleen: Normal  Adrenals/Urinary Tract: Adrenal glands are normal. Bilateral renal atrophy noted. Kidneys otherwise negative.  Stomach/Bowel: Negative   Vascular/Lymphatic: Inferior vena cava filter noted.  Reproductive: Appear to be absent  Other: Study limited by body habitus. Diffuse increased subcutaneous soft tissue  attenuation suggesting anasarca. Moderate volume of ascites.  Musculoskeletal: Mild compression deformity of T11 appears mildly progressive when compared to prior study. No evidence of retropulsion.  IMPRESSION: Pericardial and pleural effusions with anasarca and ascites.  Mild interval progression of a mild T11 compression deformity.   Electronically Signed   By: Esperanza Heir M.D.   On: 04/09/2015 22:03   US Renal  04/11/2015   CLINICAL DATA:  Acute renal failure.  EXAM: RENAL / URINARY TRACT ULTRASOUND COMPLETE  COMPARISON:  CT 04/09/2015  FINDINGS: Right Kidney:  Length: 16.4 cm. Echogenicity within normal limits. No mass or hydronephrosis visualized.  Left Kidney:  Length: 15.3 cm. Echogenicity within normal limits. No mass or hydronephrosis visualized.  Bladder:  Not visualized  Ascites most pronounced within the right upper and right lower quadrants.  IMPRESSION: No hydronephrosis.  Bladder is not well visualized.  Ascites.   Electronically Signed   By: Annia Belt M.D.   On: 04/11/2015 16:53   US Abdomen Limited  04/10/2015   CLINICAL DATA:  Ascites.  EXAM: LIMITED ABDOMINAL ULTRASOUND  COMPARISON:  CT scan of April 09, 2015.  FINDINGS: Sonographic evaluation demonstrates mild ascites in the right and left lower quadrants. However, due to the depth of these areas and the small fluid pockets present, paracentesis was not attempted.  IMPRESSION: Mild ascites is noted in the right and left lower quadrants.   Electronically Signed   By: Lupita Raider, M.D.   On: 04/10/2015 12:47   Dg Chest Portable 1 View  04/29/2015   CLINICAL DATA:  Left-sided chest pain today.  EXAM: PORTABLE CHEST - 1 VIEW  COMPARISON:  04/17/2015  FINDINGS: Enlargement of the cardiac silhouette flow most likely due to the previously demonstrated pericardial  effusion. There is slight pulmonary vascular congestion. Increased density at the left lung base is most likely secondary to an increasing left effusion. Small right effusion.  IMPRESSION: Increasing moderate left effusion. Small right effusion. Slight pulmonary vascular congestion.   Electronically Signed   By: Francene Boyers M.D.   On: 04/29/2015 12:40   Dg Chest Portable 1 View  04/09/2015   CLINICAL DATA:  Chronic cough, with 40 lb weight gain. Initial encounter.  EXAM: PORTABLE CHEST - 1 VIEW  COMPARISON:  CT of the chest performed 04/05/2015, and chest radiograph performed 02/16/2015  FINDINGS: The lungs are well-aerated. A small left pleural effusion is noted. Vascular congestion is noted, with increased interstitial markings, concerning for mild pulmonary edema. There is no evidence of pneumothorax.  The cardiomediastinal silhouette is mildly enlarged. No acute osseous abnormalities are seen.  IMPRESSION: Small left pleural effusion noted. Vascular congestion and mild cardiomegaly, with increased interstitial markings, concerning for mild pulmonary edema.   Electronically Signed   By: Roanna Raider M.D.   On: 04/09/2015 23:28    EKG: Atrial fibrillation with right bundle branch block  Weights: Filed Weights   04/29/15 1121 04/29/15 1839 04/30/15 0544  Weight: 384 lb (174.181 kg) 377 lb 14.4 oz (171.414 kg) 381 lb 6.4 oz (173.002 kg)     Physical Exam: Blood pressure 118/70, pulse 65, temperature 97.7 F (36.5 C), temperature source Oral, resp. rate 20, height 6' (1.829 m), weight 381 lb 6.4 oz (173.002 kg), SpO2 97 %. Body mass index is 51.72 kg/(m^2). General: Well developed, well nourished, in no acute distress. Head eyes ears nose throat: Normocephalic, atraumatic, sclera non-icteric, no xanthomas, nares are without discharge. No apparent thyromegaly and/or mass  Lungs: Normal respiratory effort.  no wheezes, few rales, basilar rhonchi.  Heart: Irregular with normal S1 S2. no murmur  gallop, no rub, PMI is enlarged size and placement, carotid upstroke normal without bruit, jugular venous pressure is normal Abdomen: Soft, non-tender, is with normoactive bowel sounds. No hepatomegaly. No rebound/guarding. No obvious abdominal masses. Abdominal aorta is normal size without bruit Extremities: 2+ edema. no cyanosis, no clubbing, positive ulcers  Peripheral : 2+ bilateral upper extremity pulses, 0 + bilateral femoral pulses, 0 + bilateral dorsal pedal pulse Neuro: Alert and oriented. No facial asymmetry. No focal deficit. Moves all extremities spontaneously. Musculoskeletal: Normal muscle tone without kyphosis Psych:  Responds to questions appropriately with a normal affect.    Assessment: 59 year old male with severe chronic obstructive pulmonary disease sleep apnea with severe pulmonary hypertension causing lower extremity edema and hypoxia with chronic kidney disease stage III unable to improve weight loss and fluid retention with current therapy and causing paroxysmal nonvalvular atrial fibrillation  Plan: 1. Continue intravenous Lasix and/or Lasix drip if able 2. Diabetes medication management for severe diabetic fluctuation 3. Warfarin for further risk reduction in stroke with atrial fibrillation 4. Pravastatin for high intensity cholesterol therapy 5. Advanced and aggressive oxygenation for hypoxia and sleep apnea 6. Aggressive diet control of sodium 7. Home care and/or advanced care to manage above due to patient's inability to control of above  Signed, Lamar Blinks M.D. [facility] 04/30/2015, 1:22 PM

## 2015-04-30 NOTE — Progress Notes (Signed)
PT Cancellation Note  Patient Details Name: Tyler [name] MRN: [id number] DOB: 19-Jan-1956   Cancelled Treatment:    Reason Eval/Treat Not Completed: Patient declined, no reason specified.  Pt firmly refusing PT today (pt would not give reason when asked other than he does not need PT).  Will re-attempt PT eval at a later date/time as medically appropriate/as able.   Irving Burton Nels Munn 04/30/2015, 4:01 PM Hendricks Limes, PT [phone]

## 2015-04-30 NOTE — Progress Notes (Signed)
Subjective:  Patient sitting in chair. Readmitted for massive volume overload. Current weight is recorded at 381 pounds. Patient reports significant abdominal distention. He is having difficulty with breathing now.   Objective:  Vital signs in last 24 hours:  Temp:  [97.7 F (36.5 C)-98.1 F (36.7 C)] 97.7 F (36.5 C) (08/09 1041) Pulse Rate:  [52-125] 65 (08/09 1041) Resp:  [10-26] 20 (08/09 1041) BP: (95-144)/(56-87) 118/70 mmHg (08/09 1041) SpO2:  [89 %-99 %] 97 % (08/09 1041) Weight:  [171.414 kg (377 lb 14.4 oz)-173.002 kg (381 lb 6.4 oz)] 173.002 kg (381 lb 6.4 oz) (08/09 0544)  Weight change:  Filed Weights   04/29/15 1121 04/29/15 1839 04/30/15 0544  Weight: 174.181 kg (384 lb) 171.414 kg (377 lb 14.4 oz) 173.002 kg (381 lb 6.4 oz)    Intake/Output: I/O last 3 completed shifts: In: 120 [P.O.:120] Out: -    Intake/Output this shift:  Total I/O In: 240 [P.O.:240] Out: 0   Physical Exam: General: NAD, obese, sitting  Head: Normocephalic, atraumatic. Moist oral mucosal membranes  Eyes: Anicteric  Neck: Supple, trachea midline  Lungs:  Clear, bilateral basilar wheezes  Heart: Regular rate and rhythm S1S2 no rubs  Abdomen:  Soft, nontender, obese, BS present, abdominal wall edema, distended   Extremities:  3+ peripheral edema, tight,   Neurologic: Nonfocal, moving all four extremities  Skin: No lesions  Access: none    Basic Metabolic Panel:  Recent Labs Lab 04/29/15 1203 04/29/15 1915 04/30/15 0236  NA 128*  --  132*  K 4.3  --  3.5  CL 89*  --  91*  CO2 28  --  29  GLUCOSE 181*  --  190*  BUN 36*  --  37*  CREATININE 1.81* 1.72* 1.81*  CALCIUM 8.7*  --  8.4*    Liver Function Tests: No results for input(s): AST, ALT, ALKPHOS, BILITOT, PROT, ALBUMIN in the last 168 hours. No results for input(s): LIPASE, AMYLASE in the last 168 hours. No results for input(s): AMMONIA in the last 168 hours.  CBC:  Recent Labs Lab 04/29/15 1203  04/29/15 1915 04/30/15 0236  WBC 6.4 5.1 5.6  HGB 10.5* 10.2* 10.4*  HCT 31.9* 31.3* 31.5*  MCV 81.9 82.5 81.4  PLT 249 230 248    Cardiac Enzymes:  Recent Labs Lab 04/29/15 1203 04/29/15 1915 04/29/15 2244 04/30/15 0236  TROPONINI <0.03 <0.03 <0.03 <0.03    BNP: Invalid input(s): POCBNP  CBG:  Recent Labs Lab 04/29/15 1830 04/30/15 0728 04/30/15 1122  GLUCAP 179* 170* 144*    Microbiology: Results for orders placed or performed in visit on 08/21/14  ED Influenza     Status: None   Collection Time: 08/21/14  4:53 PM  Result Value Ref Range Status   Influenza A By PCR NEGATIVE NEGATIVE Final   Influenza B By PCR NEGATIVE NEGATIVE Final   H1N1 flu by pcr NOT DETECTED NOT-DETECTED Final    Comment:                  ----------------------- The Xpert Flu assay (FDA approval for nasal aspirates or washes and nasopharyngeal swab specimens), is intended as an aid in the diagnosis of influenza and should not be used as a sole basis for treatment.     Coagulation Studies:  Recent Labs  04/29/15 1203  LABPROT 19.0*  INR 1.57    Urinalysis: No results for input(s): COLORURINE, LABSPEC, PHURINE, GLUCOSEU, HGBUR, BILIRUBINUR, KETONESUR, PROTEINUR, UROBILINOGEN, NITRITE, LEUKOCYTESUR  in the last 72 hours.  Invalid input(s): APPERANCEUR    Imaging: Dg Chest Portable 1 View  04/29/2015   CLINICAL DATA:  Left-sided chest pain today.  EXAM: PORTABLE CHEST - 1 VIEW  COMPARISON:  04/17/2015  FINDINGS: Enlargement of the cardiac silhouette flow most likely due to the previously demonstrated pericardial effusion. There is slight pulmonary vascular congestion. Increased density at the left lung base is most likely secondary to an increasing left effusion. Small right effusion.  IMPRESSION: Increasing moderate left effusion. Small right effusion. Slight pulmonary vascular congestion.   Electronically Signed   By: Francene Boyers M.D.   On: 04/29/2015 12:40     Medications:    . furosemide (LASIX) infusion 4 mg/hr (04/30/15 1101)   . cyanocobalamin  1,000 mcg Intramuscular Q30 days  . gabapentin  100 mg Oral BID  . heparin  5,000 Units Subcutaneous 3 times per day  . insulin aspart  0-9 Units Subcutaneous TID WC  . insulin aspart  10 Units Subcutaneous BID AC  . insulin detemir  80 Units Subcutaneous BID AC  . levothyroxine  100 mcg Oral QAC breakfast  . mirtazapine  30 mg Oral QHS  . PARoxetine  20 mg Oral Daily  . polyethylene glycol  17 g Oral Daily  . potassium chloride  20 mEq Oral Daily  . pravastatin  40 mg Oral QHS  . salmeterol  1 puff Inhalation Q12H  . sodium chloride  3 mL Intravenous Q12H  . spironolactone  25 mg Oral Daily  . tamsulosin  0.4 mg Oral Daily  . vitamin B-12  2,000 mcg Oral Daily  . warfarin  10 mg Oral Once per day on Mon Fri  . warfarin  5 mg Oral Once per day on Sun Tue Wed Thu Sat  . Warfarin - Physician Dosing Inpatient   Does not apply q1800   acetaminophen **OR** acetaminophen, colchicine, ipratropium-albuterol, lactulose, nystatin, ondansetron **OR** ondansetron (ZOFRAN) IV, Tiotropium Bromide-Olodaterol  Assessment/ Plan:  59 y.o. male with CKD stage III baseline Cr 1.3, hypertension, morbid obesity, chronic diastolic heart failure, paroxysmal atrial fibrillation, sleep apnea, diabetes mellitus, chronic edema, now presents with increasing edema, shortness of breath, pericardial effusion, pleural effusion, ascites, anasarca.  1.  Acute renal failure on CKD stage III: Baseline creatinine 1.48/GFR 50 2.  Generalized edema,  with pulmonary hypertension:  3.  Hyponatremia: Likely secondary to volume overload and congestive heart failure    Has underlying heart failure with anasarca. Agree with IV furosemide infusion. Obtain 24 hr urine for CrCl   LOS: 1 Johneisha Broaden 8/9/20164:07 PM

## 2015-04-30 NOTE — Care Management Note (Addendum)
Case Management Note  Patient Details  Name: Tyler [name] MRN: [id number] Date of Birth: 07-27-1956  Subjective/Objective:         58yo Tyler [name] was admitted today from home per c/o chest pain, dyspnea, legs swelling.  Last discharged from [facility] on 04/19/15. Has [name] CPAP machine and [name] home nebulizer machine. Resides with wife Tyler [name]. Chronic home oxygen supplied by [employer]. Followed by [facility] disease management program by nurse Tyler [name] ph:[phone]. Tyler [name] is an open client of [facility] for RN and PT. [facility] nurse reports that Tyler [name] is non-compliant with his CPAP machine, lasix, and diet. Presented to the ED today with [name] 30+ weight gain in 10days. Case Management will follow for discharge planning.           Action/Plan:   Expected Discharge Date:                  Expected Discharge Plan:     In-House Referral:     Discharge planning Services     Post Acute Care Choice:    Choice offered to:     DME Arranged:    DME Agency:     HH Arranged:    HH Agency:     Status of Service:     Medicare Important Message Given:    Date Medicare IM Given:    Medicare IM give by:    Date Additional Medicare IM Given:    Additional Medicare Important Message give by:     If discussed at Long Length of Stay Meetings, dates discussed:    Additional Comments:  Tyler Sanjuan A, RN 04/30/2015, 3:06 PM

## 2015-04-30 NOTE — Progress Notes (Signed)
Tyler [name] is a 59 y.o. male  <principal problem not specified>   SUBJECTIVE:  Pt readmitted with progressive weight gain and SOB with anasarca and LE edema. Pt is not compliant with CPAP and meds at home. Non-compliant with diet. Denies CP. No diuresis overnight despite 2 doses of IV Lasix.  ______________________________________________________________________  ROS: Review of systems is unremarkable for any active cardiac,respiratory, GI, GU, hematologic, neurologic or psychiatric systems, 10 systems reviewed.  @  Past Medical History  Diagnosis Date  . Cataract   . CHF (congestive heart failure)   . COPD (chronic obstructive pulmonary disease)   . Diabetes mellitus without complication   . Sleep apnea   . Oxygen deficiency   . Thyroid disease   . Gout   . Hypothyroidism   . CKD (chronic kidney disease) stage 3, GFR 30-59 ml/min   . Asthma   . Hypertension     Past Surgical History  Procedure Laterality Date  . Cholecystectomy      2002  . Eye surgery      right cataract removed 3/21  . Vena cava filter placement      PHYSICAL EXAM:  BP 105/58 mmHg  Pulse 65  Temp(Src) 98 F (36.7 C) (Oral)  Resp 18  Ht 6' (1.829 m)  Wt 173.002 kg (381 lb 6.4 oz)  BMI 51.72 kg/m2  SpO2 96%  Wt Readings from Last 3 Encounters:  04/30/15 173.002 kg (381 lb 6.4 oz)  04/25/15 169.464 kg (373 lb 9.6 oz)  04/19/15 158.487 kg (349 lb 6.4 oz)            Constitutional: NAD Neck: supple, no thyromegaly Respiratory: basilar rales without wheezes or rhonchi Cardiovascular: RRR, no murmur, no gallop Abdomen: distended with diffuse anasarca, good BS, nontender Extremities: 3+ edema Neuro: alert and oriented, no focal motor or sensory deficits  ASSESSMENT/PLAN:  Labs and imaging studies were reviewed  Will resume Lasix drip. Consult Nephrology and Cardiology. Prognosis poor. Will consult Palliative Care, PT, and CSW. Continue coumadin. Repeat labs in  AM.

## 2015-04-30 NOTE — Progress Notes (Signed)
Initial Nutrition Assessment   INTERVENTION:   Meals and Snacks: Cater to patient preferences Education: will follow-up and provide diet education  NUTRITION DIAGNOSIS:    (Unintentional weight gain) related to acute illness as evidenced by  (weight gain of 30 pounds in 2 weeks).   GOAL:   Patient will meet greater than or equal to 90% of their needs   MONITOR:    (Energy Intake, Anthropometrics, Digesitve System, Electrolyte/Renal Profile, Glucose Profile)  REASON FOR ASSESSMENT:   Diagnosis    ASSESSMENT:    Pt admitted with CHF with progressive weight gain, SOB, anasarca, LE edema; per MD note, pt is noncompliant with meds, CPAP and diet at home;  currently on lasix drip  Past Medical History  Diagnosis Date  . Cataract   . CHF (congestive heart failure)   . COPD (chronic obstructive pulmonary disease)   . Diabetes mellitus without complication   . Sleep apnea   . Oxygen deficiency   . Thyroid disease   . Gout   . Hypothyroidism   . CKD (chronic kidney disease) stage 3, GFR 30-59 ml/min   . Asthma   . Hypertension      Diet Order:  Diet Heart Room service appropriate?: Yes; Fluid consistency:: Thin   Energy Intake: appetite good, eating 75-100% of meals; pt here last week and eating 100% of meals at that time as well  Electrolyte and Renal Profile:  Recent Labs Lab 04/29/15 1203 04/29/15 1915 04/30/15 0236  BUN 36*  --  37*  CREATININE 1.81* 1.72* 1.81*  NA 128*  --  132*  K 4.3  --  3.5   Glucose Profile:  Recent Labs  04/29/15 1830 04/30/15 0728 04/30/15 1122  GLUCAP 179* 170* 144*   Protein Profile: No results for input(s): ALBUMIN in the last 168 hours.  Meds: lasix drip, ss novolog, levemir, novolog, lactulose  Last BM:  8/9   Wt Readings from Last 10 Encounters:  04/30/15 381 lb 6.4 oz (173.002 kg)  04/25/15 373 lb 9.6 oz (169.464 kg)  04/19/15 349 lb 6.4 oz (158.487 kg)  03/26/15 357 lb (161.934 kg)  03/12/15 337 lb (152.862  kg)  02/18/15 345 lb 14.4 oz (156.9 kg)   Nutrition Focused Physical Exam: Nutrition-Focused physical exam completed. Findings are WDL for fat depletion and muscle depletion.  Severe edema.    Height:   Ht Readings from Last 1 Encounters:  04/29/15 6' (1.829 m)    Weight: significant weight gain; reported 30 pound wt gain in 2 weeks  Wt Readings from Last 1 Encounters:  04/30/15 381 lb 6.4 oz (173.002 kg)   Wt Readings from Last 10 Encounters:  04/30/15 381 lb 6.4 oz (173.002 kg)  04/25/15 373 lb 9.6 oz (169.464 kg)  04/19/15 349 lb 6.4 oz (158.487 kg)  03/26/15 357 lb (161.934 kg)  03/12/15 337 lb (152.862 kg)  02/18/15 345 lb 14.4 oz (156.9 kg)   BMI:  Body mass index is 51.72 kg/(m^2).  Estimated Nutritional Needs:   Kcal:  6962-9528 kcals   Protein:  81-97 g (1.0-1.2 g/kg)   Fluid:  1620-2025 mL (20-25 ml/kg)   MODERATE Care Level  Romelle Starcher MS, RD, LDN [phone] Pager

## 2015-05-01 LAB — COMPREHENSIVE METABOLIC PANEL
ALBUMIN: 2.6 g/dL — AB (ref 3.5–5.0)
ALT: 9 U/L — AB (ref 17–63)
AST: 24 U/L (ref 15–41)
Alkaline Phosphatase: 197 U/L — ABNORMAL HIGH (ref 38–126)
Anion gap: 9 (ref 5–15)
BILIRUBIN TOTAL: 0.5 mg/dL (ref 0.3–1.2)
BUN: 36 mg/dL — ABNORMAL HIGH (ref 6–20)
CHLORIDE: 93 mmol/L — AB (ref 101–111)
CO2: 33 mmol/L — AB (ref 22–32)
Calcium: 8.2 mg/dL — ABNORMAL LOW (ref 8.9–10.3)
Creatinine, Ser: 1.59 mg/dL — ABNORMAL HIGH (ref 0.61–1.24)
GFR calc Af Amer: 53 mL/min — ABNORMAL LOW (ref >60)
GFR calc non Af Amer: 46 mL/min — ABNORMAL LOW (ref >60)
Glucose, Bld: 78 mg/dL (ref 65–99)
POTASSIUM: 3.1 mmol/L — AB (ref 3.5–5.1)
Sodium: 135 mmol/L (ref 135–145)
Total Protein: 6.2 g/dL — ABNORMAL LOW (ref 6.5–8.1)

## 2015-05-01 LAB — CBC WITH DIFFERENTIAL/PLATELET
Basophils Absolute: 0 10*3/uL (ref 0–0.1)
Basophils Relative: 1 %
EOS PCT: 3 %
Eosinophils Absolute: 0.2 10*3/uL (ref 0–0.7)
HCT: 30.1 % — ABNORMAL LOW (ref 40.0–52.0)
Hemoglobin: 9.8 g/dL — ABNORMAL LOW (ref 13.0–18.0)
LYMPHS PCT: 17 %
Lymphs Abs: 0.9 10*3/uL — ABNORMAL LOW (ref 1.0–3.6)
MCH: 26.6 pg (ref 26.0–34.0)
MCHC: 32.5 g/dL (ref 32.0–36.0)
MCV: 81.9 fL (ref 80.0–100.0)
MONO ABS: 0.6 10*3/uL (ref 0.2–1.0)
MONOS PCT: 12 %
Neutro Abs: 3.6 10*3/uL (ref 1.4–6.5)
Neutrophils Relative %: 67 %
Platelets: 226 10*3/uL (ref 150–440)
RBC: 3.67 MIL/uL — AB (ref 4.40–5.90)
RDW: 16.7 % — ABNORMAL HIGH (ref 11.5–14.5)
WBC: 5.3 10*3/uL (ref 3.8–10.6)

## 2015-05-01 LAB — GLUCOSE, CAPILLARY
GLUCOSE-CAPILLARY: 145 mg/dL — AB (ref 65–99)
Glucose-Capillary: 72 mg/dL (ref 65–99)
Glucose-Capillary: 150 mg/dL — ABNORMAL HIGH (ref 65–99)
Glucose-Capillary: 220 mg/dL — ABNORMAL HIGH (ref 65–99)

## 2015-05-01 LAB — PROTIME-INR
INR: 1.79
Prothrombin Time: 21 seconds — ABNORMAL HIGH (ref 11.4–15.0)

## 2015-05-01 MED ORDER — POTASSIUM CHLORIDE CRYS ER 20 MEQ PO TBCR
20.0000 meq | EXTENDED_RELEASE_TABLET | Freq: Three times a day (TID) | ORAL | Status: DC
  Administered 2015-05-01 (×3): 20 meq via ORAL
  Filled 2015-05-01 (×3): qty 1

## 2015-05-01 NOTE — Clinical Documentation Improvement (Addendum)
MD's, NP's, and PA's   Noted patient has OSA with BMI of 51.72, requiring the use of  BIPAP/CPAP. Would either of the following conditions be appropriate for this admission?  Thank you     Possible Conditions?  Morbid Obesity  w Hyperventilation Syndrome Other Not able to determine     Risk Factors: Exac of CHF, COPD, Pulmonary Hypertension, Hypoxemia    Treatment: CPAP  Thank you,  Lavonda Jumbo ,RN Clinical Documentation Specialist:  [phone]  [facility]- Health Information Management

## 2015-05-01 NOTE — Progress Notes (Signed)
PT Cancellation Note  Patient Details Name: Tyler [name] MRN: [id number] DOB: 1956-03-01   Cancelled Treatment:    Reason Eval/Treat Not Completed: Other (comment) (Pt became angry when PT came to see him.)  Initially agreed then became angry at PT when he complained about his back and PT suggested follow up outpatient therapy.  He began swearing and asked PT to leave, stated he was going to leave.  Contacted nursing to relate the exchange.   Ivar Drape 05/01/2015, 11:12 AM   Samul Dada, PT MS Acute Rehab Dept. Number: [id number] [id number] and [facility] [phone]

## 2015-05-01 NOTE — Progress Notes (Signed)
Patient was awaken by a headache, patient stated that he had cataract's surgery and has been getting headaches. Nurse treated patient with tylenol.

## 2015-05-01 NOTE — Progress Notes (Signed)
Patient has hospital CPAP unit at bedside. Patient has been non-compliant with CPAP. Patient stated he may wear for a few hours tonight and will put on by himself. RN notified to call if she or the patient needs help with the CPAP tonight.

## 2015-05-01 NOTE — Progress Notes (Signed)
Patient has obstructive sleep apnea, patient refused cpap.

## 2015-05-01 NOTE — Progress Notes (Signed)
Patient alert and oriented x4, no complaints at this time. vss at this time. Patient afib on telemetry. Will continue to assess. [place]   

## 2015-05-01 NOTE — Progress Notes (Signed)
Subjective:  Patient sitting in chair. Readmitted for massive volume overload. Current weight is recorded at 380 pounds. Patient reports significant abdominal distention. He was placed on IV Lasix infusion yesterday. Urine output recorded at 850 cc last 24 hours. Also, 24-hour urine for creatinine clearance is in progress   Objective:  Vital signs in last 24 hours:  Temp:  [97.7 F (36.5 C)-98.1 F (36.7 C)] 98 F (36.7 C) (08/10 1116) Pulse Rate:  [59-70] 60 (08/10 1116) Resp:  [16-20] 16 (08/10 1116) BP: (95-116)/(52-71) 115/71 mmHg (08/10 1116) SpO2:  [92 %-93 %] 92 % (08/10 1116) Weight:  [172.684 kg (380 lb 11.2 oz)] 172.684 kg (380 lb 11.2 oz) (08/10 0526)  Weight change: -1.497 kg (-3 lb 4.8 oz) Filed Weights   04/29/15 1839 04/30/15 0544 05/01/15 0526  Weight: 171.414 kg (377 lb 14.4 oz) 173.002 kg (381 lb 6.4 oz) 172.684 kg (380 lb 11.2 oz)    Intake/Output: I/O last 3 completed shifts: In: 396.7 [P.O.:360; I.V.:36.7] Out: 850 [Urine:850]   Intake/Output this shift:  Total I/O In: 120 [P.O.:120] Out: 200 [Urine:200]  Physical Exam: General: NAD, obese, sitting  Head: Normocephalic, atraumatic. Moist oral mucosal membranes  Eyes: Anicteric  Neck: Supple, trachea midline  Lungs:  Clear, bilateral basilar wheezes  Heart: Regular rate and rhythm S1S2 no rubs  Abdomen:  Soft, nontender, obese, BS present, abdominal wall edema, distended   Extremities:  3+ peripheral edema, tight,   Neurologic: Nonfocal, moving all four extremities  Skin: No lesions  Access: none    Basic Metabolic Panel:  Recent Labs Lab 04/29/15 1203 04/29/15 1915 04/30/15 0236 05/01/15 0457  NA 128*  --  132* 135  K 4.3  --  3.5 3.1*  CL 89*  --  91* 93*  CO2 28  --  29 33*  GLUCOSE 181*  --  190* 78  BUN 36*  --  37* 36*  CREATININE 1.81* 1.72* 1.81* 1.59*  CALCIUM 8.7*  --  8.4* 8.2*    Liver Function Tests:  Recent Labs Lab 05/01/15 0457  AST 24  ALT 9*  ALKPHOS 197*   BILITOT 0.5  PROT 6.2*  ALBUMIN 2.6*   No results for input(s): LIPASE, AMYLASE in the last 168 hours. No results for input(s): AMMONIA in the last 168 hours.  CBC:  Recent Labs Lab 04/29/15 1203 04/29/15 1915 04/30/15 0236 05/01/15 0457  WBC 6.4 5.1 5.6 5.3  NEUTROABS  --   --   --  3.6  HGB 10.5* 10.2* 10.4* 9.8*  HCT 31.9* 31.3* 31.5* 30.1*  MCV 81.9 82.5 81.4 81.9  PLT 249 230 248 226    Cardiac Enzymes:  Recent Labs Lab 04/29/15 1203 04/29/15 1915 04/29/15 2244 04/30/15 0236  TROPONINI <0.03 <0.03 <0.03 <0.03    BNP: Invalid input(s): POCBNP  CBG:  Recent Labs Lab 04/30/15 1122 04/30/15 1648 04/30/15 2006 05/01/15 0723 05/01/15 1118  GLUCAP 144* 75 81 72 150*    Microbiology: Results for orders placed or performed in visit on 08/21/14  ED Influenza     Status: None   Collection Time: 08/21/14  4:53 PM  Result Value Ref Range Status   Influenza A By PCR NEGATIVE NEGATIVE Final   Influenza B By PCR NEGATIVE NEGATIVE Final   H1N1 flu by pcr NOT DETECTED NOT-DETECTED Final    Comment:                  ----------------------- The Xpert Flu assay (FDA approval for nasal  aspirates or washes and nasopharyngeal swab specimens), is intended as an aid in the diagnosis of influenza and should not be used as a sole basis for treatment.     Coagulation Studies:  Recent Labs  04/29/15 1203 05/01/15 0457  LABPROT 19.0* 21.0*  INR 1.57 1.79    Urinalysis: No results for input(s): COLORURINE, LABSPEC, PHURINE, GLUCOSEU, HGBUR, BILIRUBINUR, KETONESUR, PROTEINUR, UROBILINOGEN, NITRITE, LEUKOCYTESUR in the last 72 hours.  Invalid input(s): APPERANCEUR    Imaging: Dg Chest Portable 1 View  04/29/2015   CLINICAL DATA:  Left-sided chest pain today.  EXAM: PORTABLE CHEST - 1 VIEW  COMPARISON:  04/17/2015  FINDINGS: Enlargement of the cardiac silhouette flow most likely due to the previously demonstrated pericardial effusion. There is slight pulmonary  vascular congestion. Increased density at the left lung base is most likely secondary to an increasing left effusion. Small right effusion.  IMPRESSION: Increasing moderate left effusion. Small right effusion. Slight pulmonary vascular congestion.   Electronically Signed   By: Francene Boyers M.D.   On: 04/29/2015 12:40     Medications:   . furosemide (LASIX) infusion 250 mg (04/30/15 2140)   . cyanocobalamin  1,000 mcg Intramuscular Q30 days  . gabapentin  100 mg Oral BID  . heparin  5,000 Units Subcutaneous 3 times per day  . insulin aspart  0-9 Units Subcutaneous TID WC  . levothyroxine  100 mcg Oral QAC breakfast  . mirtazapine  30 mg Oral QHS  . PARoxetine  20 mg Oral Daily  . polyethylene glycol  17 g Oral Daily  . potassium chloride SA  20 mEq Oral TID  . pravastatin  40 mg Oral QHS  . salmeterol  1 puff Inhalation Q12H  . sodium chloride  3 mL Intravenous Q12H  . spironolactone  25 mg Oral Daily  . tamsulosin  0.4 mg Oral Daily  . vitamin B-12  2,000 mcg Oral Daily  . warfarin  10 mg Oral Once per day on Mon Fri  . warfarin  5 mg Oral Once per day on Sun Tue Wed Thu Sat  . Warfarin - Physician Dosing Inpatient   Does not apply q1800   acetaminophen **OR** acetaminophen, colchicine, ipratropium-albuterol, lactulose, nystatin, ondansetron **OR** ondansetron (ZOFRAN) IV, Tiotropium Bromide-Olodaterol  Assessment/ Plan:  59 y.o. male with CKD stage III baseline Cr 1.3, hypertension, morbid obesity, chronic diastolic heart failure, paroxysmal atrial fibrillation, sleep apnea, diabetes mellitus, chronic edema, now presents with increasing edema, shortness of breath, pericardial effusion, pleural effusion, ascites, anasarca.  1.  Acute renal failure on CKD stage III: Baseline creatinine 1.48/GFR 50 2.  Generalized edema,  with pulmonary hypertension:  3.  Hyponatremia: Likely secondary to volume overload and congestive heart failure   Has underlying heart failure with anasarca.  Agree with IV furosemide infusion. Obtain 24 hr urine for CrCl   LOS: 2 Cassy Sprowl 8/10/201611:26 AM

## 2015-05-01 NOTE — Progress Notes (Signed)
[facility] Encounter Note  Patient: Tyler [name] / Admit Date: 04/29/2015 / Date of Encounter: 05/01/2015, 8:18 AM   Subjective: Slight diuresis but continued with shortness of breath and significant lower extremity edema  Review of Systems: Positive for: Lower extremity edema and shortness of breath Negative for: Vision change, hearing change, syncope, dizziness, nausea, vomiting,diarrhea, bloody stool, stomach pain, cough, congestion, diaphoresis, urinary frequency, urinary pain,skin lesions, skin rashes Others previously listed  Objective: Telemetry: Atrial fibrillation Physical Exam: Blood pressure 95/55, pulse 59, temperature 98.1 F (36.7 C), temperature source Oral, resp. rate 20, height 6' (1.829 m), weight 380 lb 11.2 oz (172.684 kg), SpO2 93 %. Body mass index is 51.62 kg/(m^2). General: Well developed, well nourished, in no acute distress. Head: Normocephalic, atraumatic, sclera non-icteric, no xanthomas, nares are without discharge. Neck: No apparent masses Lungs: Normal respirations with few wheezes, diffuse rhonchi, no rales , basilar crackles   Heart: Irregular rate and rhythm, normal S1 S2, no murmur, no rub, no gallop, PMI is diffuse, carotid upstroke normal without bruit, jugular venous pressure normal Abdomen: Soft, non-tender,  distended with normoactive bowel sounds. Not assess abdominal aorta Extremities: No edema, no clubbing, no cyanosis, no ulcers,  Peripheral: 2+ radial, 2+ femoral, 2+ dorsal pedal pulses Neuro: Alert and oriented. Moves all extremities spontaneously. Psych:  Responds to questions appropriately with a normal affect.   Intake/Output Summary (Last 24 hours) at 05/01/15 0818 Last data filed at 05/01/15 0813  Gross per 24 hour  Intake  36.67 ml  Output   1050 ml  Net -1013.33 ml    Inpatient Medications:  . cyanocobalamin  1,000 mcg Intramuscular Q30 days  . gabapentin  100 mg Oral BID  . heparin  5,000 Units  Subcutaneous 3 times per day  . insulin aspart  0-9 Units Subcutaneous TID WC  . levothyroxine  100 mcg Oral QAC breakfast  . mirtazapine  30 mg Oral QHS  . PARoxetine  20 mg Oral Daily  . polyethylene glycol  17 g Oral Daily  . potassium chloride SA  20 mEq Oral TID  . pravastatin  40 mg Oral QHS  . salmeterol  1 puff Inhalation Q12H  . sodium chloride  3 mL Intravenous Q12H  . spironolactone  25 mg Oral Daily  . tamsulosin  0.4 mg Oral Daily  . vitamin B-12  2,000 mcg Oral Daily  . warfarin  10 mg Oral Once per day on Mon Fri  . warfarin  5 mg Oral Once per day on Sun Tue Wed Thu Sat  . Warfarin - Physician Dosing Inpatient   Does not apply q1800   Infusions:  . furosemide (LASIX) infusion 250 mg (04/30/15 2140)    Labs:  Recent Labs  04/30/15 0236 05/01/15 0457  NA 132* 135  K 3.5 3.1*  CL 91* 93*  CO2 29 33*  GLUCOSE 190* 78  BUN 37* 36*  CREATININE 1.81* 1.59*  CALCIUM 8.4* 8.2*    Recent Labs  05/01/15 0457  AST 24  ALT 9*  ALKPHOS 197*  BILITOT 0.5  PROT 6.2*  ALBUMIN 2.6*    Recent Labs  04/30/15 0236 05/01/15 0457  WBC 5.6 5.3  NEUTROABS  --  3.6  HGB 10.4* 9.8*  HCT 31.5* 30.1*  MCV 81.4 81.9  PLT 248 226    Recent Labs  04/29/15 1203 04/29/15 1915 04/29/15 2244 04/30/15 0236  TROPONINI <0.03 <0.03 <0.03 <0.03   Invalid input(s): POCBNP No results for input(s): HGBA1C  in the last 72 hours.   Weights: Filed Weights   04/29/15 1839 04/30/15 0544 05/01/15 0526  Weight: 377 lb 14.4 oz (171.414 kg) 381 lb 6.4 oz (173.002 kg) 380 lb 11.2 oz (172.684 kg)     Radiology/Studies:  Dg Chest 2 View  04/17/2015   CLINICAL DATA:  59 year old male with a history of COPD and chronic respiratory failure. Abdominal pain.  EXAM: CHEST - 2 VIEW  COMPARISON:  04/13/2015, 04/11/2015, 04/09/2015, CT chest 04/05/2015  FINDINGS: Cardiomediastinal silhouette unchanged. Left heart border partially obscured by overlying lung and pleural disease.  Fullness  in the central hilar vessels, with interlobular septal thickening bilaterally.  Opacity at the left base appears similar to comparison plain film on both AP and lateral views.  Lucency at the margin of the cardiac silhouette on the lateral view, retrosternal window, worsened from the comparison plain film.  No displaced fracture.  Unremarkable appearance of the upper abdomen.  IMPRESSION: There appears to be enlarging pericardial effusion compared to the prior plain film. Correlation with echocardiography recommended to evaluate for cardiac output compromise given the history of respiratory failure.  Persisting low lung volumes with mild interstitial edema and likely atelectasis at the left base, or less likely pneumonia/pneumonitis.  These results were called by telephone at the time of interpretation on 04/17/2015 at 7:51 am to the nurse caring for the patient, Ms Dorita Fray, who verbally acknowledged these results.  Signed,  Yvone Neu. Loreta Ave, DO  [facility]  [facility]   Electronically Signed   By: Gilmer Mor D.O.   On: 04/17/2015 07:52   Dg Chest 2 View  04/13/2015   CLINICAL DATA:  History of CHF, no current chest complaints  EXAM: CHEST - 2 VIEW  COMPARISON:  04/11/2015  FINDINGS: Cardiac shadow remains enlarged. Vascular congestion is again identified with mild interstitial edema. Persistent left basilar opacity is noted. No new focal abnormality is seen.  IMPRESSION: Stable CHF.  Persistent left basilar opacity without significant change from the prior exam.   Electronically Signed   By: Alcide Clever M.D.   On: 04/13/2015 08:18   Dg Chest 2 View  04/11/2015   CLINICAL DATA:  CHF, COPD -asthma, diabetes, morbid obesity.  EXAM: CHEST  2 VIEW  COMPARISON:  Portable chest x-ray of April 09, 2015  FINDINGS: The right lung is adequately inflated. There is no focal infiltrate. The interstitial markings are coarse and slightly more conspicuous today. On the  left there is slight interval improvement in left lower lobe atelectasis or pneumonia. The left hemidiaphragm is faintly visible and the left heart border is slightly better visualized. A pleural effusion may be present. The trachea is midline. The cardiac silhouette where visualized is enlarged. The pulmonary vascularity is engorged.  IMPRESSION: 1. Slight interval improvement in aeration of the left lower lobe may reflect resolving atelectasis or pneumonia. 2. CHF with pulmonary interstitial edema slightly more conspicuous today.   Electronically Signed   By: David  [place] M.D.   On: 04/11/2015 07:41   Ct Chest Wo Contrast  04/05/2015   CLINICAL DATA:  Chronic respiratory failure  EXAM: CT CHEST WITHOUT CONTRAST  TECHNIQUE: Multidetector CT imaging of the chest was performed following the standard protocol without IV contrast.  COMPARISON:  None  FINDINGS: Mediastinum: The heart size is mildly enlarged. There is a small to moderate pericardial effusion noted. This is similar to 05/30/2014. Aortic atherosclerosis identified. The trachea appears patent and is midline. Normal appearance  of the esophagus. Similar appearance of prominent mediastinal lymph nodes. The largest is in the high right paratracheal region measuring 11 mm, image 11/series 2.  Lungs/Pleura: There are small bilateral pleural effusions. Atelectasis is noted in the left lung day stress set atelectasis is identified within the superior segment of both lower lobes. No lobar consolidation. Chronic mosaic attenuation pattern is identified in both lungs, with patchy areas of ground-glass attenuation in both lungs. The appearance is similar to previous exam. There is no airspace consolidation identified.  Upper Abdomen: No focal liver or splenic abnormality. There is moderate ascites within the upper abdomen. The adrenal glands are unremarkable.  Musculoskeletal: Mild multi level degenerative disc disease identified within the thoracic spine.   IMPRESSION: 1. Mild cardiac enlargement and pericardial effusion is again noted and appears similar to previous exam 2. Aortic atherosclerosis 3. Patchy areas of ground-glass attenuation are again identified in both lungs. This is a finding which is been present dating back to 2012 is likely related to underlying interstitial lung disease such as NSIP. 4. Small bilateral pleural effusions and  left base scarring.   Electronically Signed   By: Signa Kell M.D.   On: 04/05/2015 16:10   Ct Abdomen Pelvis W Contrast  04/09/2015   CLINICAL DATA:  Constipation and abdominal pain, 40 pound weight gain in last 2 months  EXAM: CT ABDOMEN AND PELVIS WITH CONTRAST  TECHNIQUE: Multidetector CT imaging of the abdomen and pelvis was performed using the standard protocol following bolus administration of intravenous contrast.  CONTRAST:  OMNIPAQUE IOHEXOL 350 MG/ML SOLN  COMPARISON:  CT scan 05/09/2014  FINDINGS: Lower chest: Moderate to large pericardial effusion, increased when compared to prior study. Small bilateral pleural effusions new or increased when compared to prior study. Visualized portions of the lung bases clear except for atelectasis left base.  Hepatobiliary: Status post cholecystectomy. No focal hepatic abnormalities.  Pancreas: Normal  Spleen: Normal  Adrenals/Urinary Tract: Adrenal glands are normal. Bilateral renal atrophy noted. Kidneys otherwise negative.  Stomach/Bowel: Negative  Vascular/Lymphatic: Inferior vena cava filter noted.  Reproductive: Appear to be absent  Other: Study limited by body habitus. Diffuse increased subcutaneous soft tissue attenuation suggesting anasarca. Moderate volume of ascites.  Musculoskeletal: Mild compression deformity of T11 appears mildly progressive when compared to prior study. No evidence of retropulsion.  IMPRESSION: Pericardial and pleural effusions with anasarca and ascites.  Mild interval progression of a mild T11 compression deformity.   Electronically  Signed   By: Esperanza Heir M.D.   On: 04/09/2015 22:03   US Renal  04/11/2015   CLINICAL DATA:  Acute renal failure.  EXAM: RENAL / URINARY TRACT ULTRASOUND COMPLETE  COMPARISON:  CT 04/09/2015  FINDINGS: Right Kidney:  Length: 16.4 cm. Echogenicity within normal limits. No mass or hydronephrosis visualized.  Left Kidney:  Length: 15.3 cm. Echogenicity within normal limits. No mass or hydronephrosis visualized.  Bladder:  Not visualized  Ascites most pronounced within the right upper and right lower quadrants.  IMPRESSION: No hydronephrosis.  Bladder is not well visualized.  Ascites.   Electronically Signed   By: Annia Belt M.D.   On: 04/11/2015 16:53   US Abdomen Limited  04/10/2015   CLINICAL DATA:  Ascites.  EXAM: LIMITED ABDOMINAL ULTRASOUND  COMPARISON:  CT scan of April 09, 2015.  FINDINGS: Sonographic evaluation demonstrates mild ascites in the right and left lower quadrants. However, due to the depth of these areas and the small fluid pockets present, paracentesis was not attempted.  IMPRESSION: Mild ascites is noted in the right and left lower quadrants.   Electronically Signed   By: Lupita Raider, M.D.   On: 04/10/2015 12:47   Dg Chest Portable 1 View  04/29/2015   CLINICAL DATA:  Left-sided chest pain today.  EXAM: PORTABLE CHEST - 1 VIEW  COMPARISON:  04/17/2015  FINDINGS: Enlargement of the cardiac silhouette flow most likely due to the previously demonstrated pericardial effusion. There is slight pulmonary vascular congestion. Increased density at the left lung base is most likely secondary to an increasing left effusion. Small right effusion.  IMPRESSION: Increasing moderate left effusion. Small right effusion. Slight pulmonary vascular congestion.   Electronically Signed   By: Francene Boyers M.D.   On: 04/29/2015 12:40   Dg Chest Portable 1 View  04/09/2015   CLINICAL DATA:  Chronic cough, with 40 lb weight gain. Initial encounter.  EXAM: PORTABLE CHEST - 1 VIEW  COMPARISON:  CT of the  chest performed 04/05/2015, and chest radiograph performed 02/16/2015  FINDINGS: The lungs are well-aerated. A small left pleural effusion is noted. Vascular congestion is noted, with increased interstitial markings, concerning for mild pulmonary edema. There is no evidence of pneumothorax.  The cardiomediastinal silhouette is mildly enlarged. No acute osseous abnormalities are seen.  IMPRESSION: Small left pleural effusion noted. Vascular congestion and mild cardiomegaly, with increased interstitial markings, concerning for mild pulmonary edema.   Electronically Signed   By: Roanna Raider M.D.   On: 04/09/2015 23:28     Assessment and Recommendation  59 y.o. male with known severe combined systolic and diastolic dysfunction congestive heart failure most consistent with severe COPD hypoxia and sleep apnea with chronic kidney disease chronic nonvalvular atrial fibrillation and diabetes on appropriate medication management with slight diuresis but noncompliant with appropriate oxygenation and CPAP use to improve patient long-term and keep him out of the hospital. 1. Continue Lasix drip 2. Continue anticoagulation for further risk reduction in stroke with atrial fibrillation 3. Continue anticoagulation for deep venous thrombosis in the past and possible recurrence with concerns for recurrent pulmonary embolism 4. Continue aggressive CPAP use or further consideration of pulmonology and/or ENT for placement of tracheostomy and/or further aggressive treatment to reduce rehospitalization 5. No further cardiac diagnostics at this time  Signed, Arnoldo Hooker M.D. FACC

## 2015-05-01 NOTE — Care Management Important Message (Signed)
Important Message  Patient Details  Name: Tyler [name] MRN: [id number] Date of Birth: Sep 05, 1956   Medicare Important Message Given:  Yes-second notification given    Olegario Messier A Allmond 05/01/2015, 11:03 AM

## 2015-05-01 NOTE — Progress Notes (Signed)
Tyler [name] is a 59 y.o. male  <principal problem not specified>   SUBJECTIVE:  Pt starting to diurese with Lasix drip. Feels better. Still refusing to wear CPAP qhs. Denies Cp. Still with DOE.  ______________________________________________________________________  ROS: Review of systems is unremarkable for any active cardiac,respiratory, GI, GU, hematologic, neurologic or psychiatric systems, 10 systems reviewed.  @  Past Medical History  Diagnosis Date  . Cataract   . CHF (congestive heart failure)   . COPD (chronic obstructive pulmonary disease)   . Diabetes mellitus without complication   . Sleep apnea   . Oxygen deficiency   . Thyroid disease   . Gout   . Hypothyroidism   . CKD (chronic kidney disease) stage 3, GFR 30-59 ml/min   . Asthma   . Hypertension     Past Surgical History  Procedure Laterality Date  . Cholecystectomy      2002  . Eye surgery      right cataract removed 3/21  . Vena cava filter placement      PHYSICAL EXAM:  BP 95/55 mmHg  Pulse 59  Temp(Src) 98.1 F (36.7 C) (Oral)  Resp 20  Ht 6' (1.829 m)  Wt 172.684 kg (380 lb 11.2 oz)  BMI 51.62 kg/m2  SpO2 93%  Wt Readings from Last 3 Encounters:  05/01/15 172.684 kg (380 lb 11.2 oz)  04/25/15 169.464 kg (373 lb 9.6 oz)  04/19/15 158.487 kg (349 lb 6.4 oz)            Constitutional: NAD Neck: supple, no thyromegaly Respiratory: basilar rales Cardiovascular: RRR, no murmur, no gallop Abdomen: morbidly obese with anasarca, good BS, nontender Extremities: 3+ edema Neuro: alert and oriented, no focal motor or sensory deficits  ASSESSMENT/PLAN:  Labs and imaging studies were reviewed  PT eval today. Continue Lasix drip. Increase K+ supplementation. Await f/u from Cardiology and Nephrology. Prognosis poor as pt refuses to help with his situation. Await input from Palliative Care. Pt will need placement. Social work to help with this. Repeat labs in AM.

## 2015-05-02 DIAGNOSIS — I5033 Acute on chronic diastolic (congestive) heart failure: Principal | ICD-10-CM

## 2015-05-02 DIAGNOSIS — Z794 Long term (current) use of insulin: Secondary | ICD-10-CM

## 2015-05-02 DIAGNOSIS — J449 Chronic obstructive pulmonary disease, unspecified: Secondary | ICD-10-CM

## 2015-05-02 DIAGNOSIS — Z79899 Other long term (current) drug therapy: Secondary | ICD-10-CM

## 2015-05-02 DIAGNOSIS — I10 Essential (primary) hypertension: Secondary | ICD-10-CM

## 2015-05-02 DIAGNOSIS — Z515 Encounter for palliative care: Secondary | ICD-10-CM

## 2015-05-02 DIAGNOSIS — R601 Generalized edema: Secondary | ICD-10-CM

## 2015-05-02 DIAGNOSIS — E119 Type 2 diabetes mellitus without complications: Secondary | ICD-10-CM

## 2015-05-02 LAB — CBC WITH DIFFERENTIAL/PLATELET
Basophils Absolute: 0.1 10*3/uL (ref 0–0.1)
Basophils Relative: 1 %
Eosinophils Absolute: 0.2 10*3/uL (ref 0–0.7)
Eosinophils Relative: 3 %
HCT: 31 % — ABNORMAL LOW (ref 40.0–52.0)
Hemoglobin: 10.3 g/dL — ABNORMAL LOW (ref 13.0–18.0)
LYMPHS ABS: 1 10*3/uL (ref 1.0–3.6)
LYMPHS PCT: 20 %
MCH: 27.6 pg (ref 26.0–34.0)
MCHC: 33.2 g/dL (ref 32.0–36.0)
MCV: 83.3 fL (ref 80.0–100.0)
Monocytes Absolute: 0.6 10*3/uL (ref 0.2–1.0)
Monocytes Relative: 12 %
NEUTROS PCT: 64 %
Neutro Abs: 3.2 10*3/uL (ref 1.4–6.5)
Platelets: 236 10*3/uL (ref 150–440)
RBC: 3.73 MIL/uL — AB (ref 4.40–5.90)
RDW: 17.2 % — ABNORMAL HIGH (ref 11.5–14.5)
WBC: 5 10*3/uL (ref 3.8–10.6)

## 2015-05-02 LAB — COMPREHENSIVE METABOLIC PANEL
ALBUMIN: 2.6 g/dL — AB (ref 3.5–5.0)
ALK PHOS: 178 U/L — AB (ref 38–126)
ALT: 9 U/L — AB (ref 17–63)
ANION GAP: 10 (ref 5–15)
AST: 25 U/L (ref 15–41)
BILIRUBIN TOTAL: 0.6 mg/dL (ref 0.3–1.2)
BUN: 35 mg/dL — ABNORMAL HIGH (ref 6–20)
CHLORIDE: 93 mmol/L — AB (ref 101–111)
CO2: 31 mmol/L (ref 22–32)
Calcium: 8.3 mg/dL — ABNORMAL LOW (ref 8.9–10.3)
Creatinine, Ser: 1.53 mg/dL — ABNORMAL HIGH (ref 0.61–1.24)
GFR calc Af Amer: 56 mL/min — ABNORMAL LOW (ref >60)
GFR calc non Af Amer: 48 mL/min — ABNORMAL LOW (ref >60)
Glucose, Bld: 168 mg/dL — ABNORMAL HIGH (ref 65–99)
POTASSIUM: 3.4 mmol/L — AB (ref 3.5–5.1)
SODIUM: 134 mmol/L — AB (ref 135–145)
Total Protein: 6.4 g/dL — ABNORMAL LOW (ref 6.5–8.1)

## 2015-05-02 LAB — GLUCOSE, CAPILLARY
GLUCOSE-CAPILLARY: 207 mg/dL — AB (ref 65–99)
Glucose-Capillary: 157 mg/dL — ABNORMAL HIGH (ref 65–99)
Glucose-Capillary: 194 mg/dL — ABNORMAL HIGH (ref 65–99)
Glucose-Capillary: 187 mg/dL — ABNORMAL HIGH (ref 65–99)

## 2015-05-02 LAB — PROTIME-INR
INR: 1.76
Prothrombin Time: 20.7 seconds — ABNORMAL HIGH (ref 11.4–15.0)

## 2015-05-02 MED ORDER — POTASSIUM CHLORIDE CRYS ER 20 MEQ PO TBCR
20.0000 meq | EXTENDED_RELEASE_TABLET | Freq: Four times a day (QID) | ORAL | Status: DC
  Administered 2015-05-02 – 2015-05-03 (×5): 20 meq via ORAL
  Filled 2015-05-02 (×5): qty 1

## 2015-05-02 MED ORDER — ALUM & MAG HYDROXIDE-SIMETH 200-200-20 MG/5ML PO SUSP
15.0000 mL | Freq: Three times a day (TID) | ORAL | Status: DC | PRN
  Administered 2015-05-02: 15 mL via ORAL
  Filled 2015-05-02: qty 30

## 2015-05-02 NOTE — Progress Notes (Signed)
Tyler [name] is a 59 y.o. male  <principal problem not specified>   SUBJECTIVE:  Pt in NAD. Denies CP. Still with edema and DOE. Refusing PT. Refusing SQ heparin. Is wearing his CPAP this AM. Remains on Lasix drip. Labs stable. O2 sats stable.  ______________________________________________________________________  ROS: Review of systems is unremarkable for any active cardiac,respiratory, GI, GU, hematologic, neurologic or psychiatric systems, 10 systems reviewed.  @  Past Medical History  Diagnosis Date  . Cataract   . CHF (congestive heart failure)   . COPD (chronic obstructive pulmonary disease)   . Diabetes mellitus without complication   . Sleep apnea   . Oxygen deficiency   . Thyroid disease   . Gout   . Hypothyroidism   . CKD (chronic kidney disease) stage 3, GFR 30-59 ml/min   . Asthma   . Hypertension     Past Surgical History  Procedure Laterality Date  . Cholecystectomy      2002  . Eye surgery      right cataract removed 3/21  . Vena cava filter placement      PHYSICAL EXAM:  BP 113/65 mmHg  Pulse 71  Temp(Src) 98 F (36.7 C) (Oral)  Resp 20  Ht 6' (1.829 m)  Wt 169.101 kg (372 lb 12.8 oz)  BMI 50.55 kg/m2  SpO2 96%  Wt Readings from Last 3 Encounters:  05/02/15 169.101 kg (372 lb 12.8 oz)  04/25/15 169.464 kg (373 lb 9.6 oz)  04/19/15 158.487 kg (349 lb 6.4 oz)            Constitutional: NAD Neck: supple, no thyromegaly Respiratory: basilar rales Cardiovascular: RRR, no murmur, no gallop Abdomen: morbidly obese with anasarca, good BS, nontender Extremities: 3+ edema Neuro: alert and oriented, no focal motor or sensory deficits  ASSESSMENT/PLAN:  Labs and imaging studies were reviewed  Will continue Lasix drip x 24h. Will then convert to po meds. Plan D/C to SNF tomorrow. Unsafe for D/C home.

## 2015-05-02 NOTE — Progress Notes (Signed)
Subjective:  Patient laying in bed. Does not want to work with PT due to back pain. Readmitted for massive volume overload. Current weight is recorded at 380 --> 372 pounds. Patient reports significant abdominal distention. He is continued on IV Lasix infusion . Urine output recorded at 1035  Cc last 24 hours.     Objective:  Vital signs in last 24 hours:  Temp:  [97.5 F (36.4 C)-98 F (36.7 C)] 97.5 F (36.4 C) (08/11 1134) Pulse Rate:  [61-71] 63 (08/11 1134) Resp:  [20-22] 22 (08/11 1134) BP: (99-131)/(57-74) 115/63 mmHg (08/11 1134) SpO2:  [91 %-96 %] 91 % (08/11 1134) Weight:  [169.101 kg (372 lb 12.8 oz)] 169.101 kg (372 lb 12.8 oz) (08/11 0416)  Weight change: -3.583 kg (-7 lb 14.4 oz) Filed Weights   04/30/15 0544 05/01/15 0526 05/02/15 0416  Weight: 173.002 kg (381 lb 6.4 oz) 172.684 kg (380 lb 11.2 oz) 169.101 kg (372 lb 12.8 oz)    Intake/Output: I/O last 3 completed shifts: In: 677.9 [P.O.:600; I.V.:77.9] Out: 1586 [Urine:1585; Stool:1]   Intake/Output this shift:  Total I/O In: 240 [P.O.:240] Out: 300 [Urine:300]  Physical Exam: General: NAD, obese, sitting  Head: Normocephalic, atraumatic. Moist oral mucosal membranes  Eyes: Anicteric  Neck: Supple, trachea midline  Lungs:  Clear, bilateral basilar wheezes  Heart: Regular rate and rhythm S1S2 no rubs  Abdomen:  Soft, nontender, obese, BS present, abdominal wall edema, distended   Extremities:  3+ peripheral edema, tight,   Neurologic: Nonfocal, moving all four extremities  Skin: No lesions  Access: none    Basic Metabolic Panel:  Recent Labs Lab 04/29/15 1203 04/29/15 1915 04/30/15 0236 05/01/15 0457 05/02/15 0424  NA 128*  --  132* 135 134*  K 4.3  --  3.5 3.1* 3.4*  CL 89*  --  91* 93* 93*  CO2 28  --  29 33* 31  GLUCOSE 181*  --  190* 78 168*  BUN 36*  --  37* 36* 35*  CREATININE 1.81* 1.72* 1.81* 1.59* 1.53*  CALCIUM 8.7*  --  8.4* 8.2* 8.3*    Liver Function Tests:  Recent  Labs Lab 05/01/15 0457 05/02/15 0424  AST 24 25  ALT 9* 9*  ALKPHOS 197* 178*  BILITOT 0.5 0.6  PROT 6.2* 6.4*  ALBUMIN 2.6* 2.6*   No results for input(s): LIPASE, AMYLASE in the last 168 hours. No results for input(s): AMMONIA in the last 168 hours.  CBC:  Recent Labs Lab 04/29/15 1203 04/29/15 1915 04/30/15 0236 05/01/15 0457 05/02/15 0424  WBC 6.4 5.1 5.6 5.3 5.0  NEUTROABS  --   --   --  3.6 3.2  HGB 10.5* 10.2* 10.4* 9.8* 10.3*  HCT 31.9* 31.3* 31.5* 30.1* 31.0*  MCV 81.9 82.5 81.4 81.9 83.3  PLT 249 230 248 226 236    Cardiac Enzymes:  Recent Labs Lab 04/29/15 1203 04/29/15 1915 04/29/15 2244 04/30/15 0236  TROPONINI <0.03 <0.03 <0.03 <0.03    BNP: Invalid input(s): POCBNP  CBG:  Recent Labs Lab 05/01/15 1118 05/01/15 1616 05/01/15 2013 05/02/15 0733 05/02/15 1134  GLUCAP 150* 145* 220* 157* 194*    Microbiology: Results for orders placed or performed in visit on 08/21/14  ED Influenza     Status: None   Collection Time: 08/21/14  4:53 PM  Result Value Ref Range Status   Influenza A By PCR NEGATIVE NEGATIVE Final   Influenza B By PCR NEGATIVE NEGATIVE Final   H1N1 flu by pcr  NOT DETECTED NOT-DETECTED Final    Comment:                  ----------------------- The Xpert Flu assay (FDA approval for nasal aspirates or washes and nasopharyngeal swab specimens), is intended as an aid in the diagnosis of influenza and should not be used as a sole basis for treatment.     Coagulation Studies:  Recent Labs  05/01/15 0457 05/02/15 0424  LABPROT 21.0* 20.7*  INR 1.79 1.76    Urinalysis: No results for input(s): COLORURINE, LABSPEC, PHURINE, GLUCOSEU, HGBUR, BILIRUBINUR, KETONESUR, PROTEINUR, UROBILINOGEN, NITRITE, LEUKOCYTESUR in the last 72 hours.  Invalid input(s): APPERANCEUR    Imaging: No results found.   Medications:   . furosemide (LASIX) infusion 4 mg/hr (05/01/15 2030)   . cyanocobalamin  1,000 mcg Intramuscular Q30  days  . gabapentin  100 mg Oral BID  . heparin  5,000 Units Subcutaneous 3 times per day  . insulin aspart  0-9 Units Subcutaneous TID WC  . levothyroxine  100 mcg Oral QAC breakfast  . mirtazapine  30 mg Oral QHS  . PARoxetine  20 mg Oral Daily  . polyethylene glycol  17 g Oral Daily  . potassium chloride SA  20 mEq Oral QID  . pravastatin  40 mg Oral QHS  . salmeterol  1 puff Inhalation Q12H  . sodium chloride  3 mL Intravenous Q12H  . spironolactone  25 mg Oral Daily  . tamsulosin  0.4 mg Oral Daily  . vitamin B-12  2,000 mcg Oral Daily  . warfarin  10 mg Oral Once per day on Mon Fri  . warfarin  5 mg Oral Once per day on Sun Tue Wed Thu Sat  . Warfarin - Physician Dosing Inpatient   Does not apply q1800   acetaminophen **OR** acetaminophen, colchicine, ipratropium-albuterol, lactulose, nystatin, ondansetron **OR** ondansetron (ZOFRAN) IV, Tiotropium Bromide-Olodaterol  Assessment/ Plan:  59 y.o. male with CKD stage III baseline Cr 1.3, hypertension, morbid obesity, chronic diastolic heart failure, paroxysmal atrial fibrillation, sleep apnea, diabetes mellitus, chronic edema, now presents with increasing edema, shortness of breath, pericardial effusion, pleural effusion, ascites, anasarca.  1.  Acute renal failure on CKD stage III: Baseline creatinine 1.48/GFR 50 2.  Generalized edema,  with pulmonary hypertension:  3.  Hyponatremia: Likely secondary to volume overload and congestive heart failure   Has underlying heart failure with anasarca. Agree with IV furosemide infusion. Obtain 24 hr urine for CrCl results are pending   LOS: 3 Roxie Gueye 8/11/20163:13 PM

## 2015-05-02 NOTE — Care Management (Signed)
Received call from Amy at [facility] [phone]. Asked if patient had a CPAP machine. Spoke with patient concerning CPAP. Patient stated to me that he does not have a CPAP at home and that it has been 28 years since he has had a sleep study. Stated that he has been wearing it here and tolerates OK. Contacted Edwina at [facility] to inform of pending discharge tomorrow ant asked if she could confirm if patient has a CPAP or not She stated that it was reported to her by nurse that sees patient that he does not have one. Contacted Dr Judithann Sheen to inform of this and he stated that he would schedule the patient for a sleep study at follow up appointment after discharge.

## 2015-05-02 NOTE — Progress Notes (Signed)
Patient was made an initial appointment at the outpatient [facility] on May 16, 2015 at 9:00am. Thank you.

## 2015-05-02 NOTE — Clinical Social Work Note (Signed)
Clinical Social Work Assessment  Patient Details  Name: Tyler [name] MRN: [id number] Date of Birth: 06-Feb-1956  Date of referral:  05/02/15               Reason for consult:  Facility Placement                Permission sought to share information with:  PCP Permission granted to share information::  Yes, Verbal Permission Granted  Name::        Agency::     Relationship::     Contact Information:     Housing/Transportation Living arrangements for the past 2 months:   [facility]) Source of Information:  Patient Patient Interpreter Needed:  None Criminal Activity/Legal Involvement Pertinent to Current Situation/Hospitalization:  No - Comment as needed Significant Relationships:  Adult Children, Spouse Lives with:  Spouse, Adult Children Do you feel safe going back to the place where you live?  Yes Need for family participation in patient care:  Yes (Comment)  Care giving concerns:  Patient lives with his wife (who works first shift) and a son (who works 3rd shift)   Office manager / plan:  Physical therapy approached CSW this morning after working with patient and stated that patient had no skilled need from a PT standpoint to go to a facility for rehab. Thus, patient's insurance , [employer]are [employer], will not authorize for patient to go to rehab. Patient informed CSW that he is unable to pay out of pocket. Patient states that he lives in a trailer and has modified his daily activities as much as possible to try and keep his symptoms and condition as stable as he can. Patient states he has had home health in the past but does not really see a benefit to it but states he would be agreeable to have them again. Patient states he has oxygen at home but does not have a cpap. Patient is very pleasant and talkative.   Employment status:  Disabled (Comment on whether or not currently receiving Disability) Insurance information:    PT Recommendations:    Information /  Referral to community resources:     Patient/Family's Response to care:  Understanding and cooperative  Patient/Family's Understanding of and Emotional Response to Diagnosis, Current Treatment, and Prognosis:  Verbalizes understanding that he has a chronic disease.   Emotional Assessment Appearance:  Appears older than stated age Attitude/Demeanor/Rapport:   (pleasant and cooperative and talkative) Affect (typically observed):  Accepting, Adaptable, Appropriate Orientation:  Oriented to Self, Oriented to Place, Oriented to  Time, Oriented to Situation Alcohol / Substance use:  Not Applicable Psych involvement (Current and /or in the community):  No (Comment)  Discharge Needs  Concerns to be addressed:  Care Coordination Readmission within the last 30 days:  Yes Current discharge risk:  None Barriers to Discharge:  No Barriers Identified   York Spaniel, LCSW 05/02/2015, 10:57 AM

## 2015-05-02 NOTE — Progress Notes (Addendum)
Patient alert and oriented x4, no complaints at this time. vss at this time. Patient afib on telemetry. Will continue to assess. Patient refusing heparin, Dr. Judithann Sheen aware. Patient was refusing PT previously to today, after talking with patient agreed to work with PT. Trudee Kuster

## 2015-05-02 NOTE — Care Management CHF Note (Signed)
Patient is open for HRI with [facility], Discussed  Case with liaison and also Dr Judithann Sheen at [facility]. Plan is for discharge to home tomorrow with Home Health High Risk initiative. Will need resumption of care orders.

## 2015-05-02 NOTE — Consult Note (Signed)
Palliative Care Update:  Today I met with patient and his wife.  I had reviewed the chart and I examine patient.  [name] had numerous questions and I did try to address some of them as I felt this would be helpful in terms of discussing goals of care. She was very pleasant. She had many questions such as the following:  --She wanted to know what was 'really wrong with him'.  --She said she hears different things from different doctors and no one is saying what is the one thing that is wrong.  Is it his heart?  If if beats OK, then how can it be his heart? etc --She wanted to know what the reason was for him getting an IVC filter last Sep 2015. --Michela Pitcher 'he is on too many meds'. --She wanted to know why he was 'taken off of Thurosamide and put on Furosemide'--- (?) --She says 'he is no better now than when he came in"  --She also says he will not stay longer and that he cannot go to a facility due to insurance and wants to know how he will get better at home.   The patient, himself, yelled his answers to me a couple of times --and yelled in an angry manner, not just so I could hear him over the CPAP noise.  He had covers over his head for much of the visit.  He had eyes closed when I politely asked if he could open his eyes  as I attempted to explain diastolic heart failure using gestures. He hollered at me angrily saying "[place]!"  At times, he seemed to be listening (somewhat)--- but he rarely spoke.   When I (very pleasantly) brought up code status, he responded, "I don't want to be bothered with that question!"  His wife says they have talked about things like this and she reports that  he does want to have CPR 'Tried' etc ---but he doesn't want to 'be a vegetable'.   His wife says 'people think he smokes more than he does'.  She says he went to little cigars and only smokes one every few days.  She says they do not use salt and she only buys canned foods that have no salt.    I  think that there is a huge gap between how much is wrong with this gentleman, and how much both he and his wife understand about it all. He did not seem receptive to education.  His wife does seem receptive, but she has a lot of questions about everything and it is hard to discuss only the important issues with her--- since every issue is important to her. He will have an appointment at the [facility] on Aug 25th.  I hope that his wife can go with him to that appointment.   I do not feel that either of them has an appreciation that he actually has a significantly  limited lifespan due to his heart failure.  Neither of them understands what is wrong. Its not cancer --they could understand that.  It seems to be something they cannot grasp of think of in tangible ways.   They seem to want it fixed.  I mentioned 'managing it' instead of 'fixing it'-- but that led to the wife bringing up up  'thurosamide vs furosemide'.   It is hard to get to the point where one can discuss goals of care when the patient cannot be' bothered' with important  questions such as those related to code status.    Full Note to follow.  I am told he is going home in the morning.  I truly wish I could have helped more.  He remains full code and goals of care seem to be to have all that is wrong with him corrected as quickly as possible.  Some of patient's attitude is likely a protective measure as he does not want to acknowledge 'bad news'.    Kirby Funk, MD

## 2015-05-02 NOTE — Progress Notes (Signed)
Pt was upset earlier about the automated cpap> He stated it was not working right. I tried to explain to him about the equipment and he got upset and wanted me to leave his room. He did not want me to put the cpap on him. He was wearing a 2.5L nasal cannula. No distress noted. Nurse notified

## 2015-05-02 NOTE — Evaluation (Signed)
Physical Therapy Evaluation Patient Details Name: Tyler [name] MRN: [id number] DOB: [birth year] Today's Date: 05/02/2015   History of Present Illness  Tyler [name] is a 59 y.o. male with a known history of COPD, chronic respiratory failure 2.5 L nasal Baseline, type 2 diabetes mellitus requiring, moderate pulmonary hypertension presenting with abdominal pain. Pt recently dc from hospital on 04/19/15 and now re-admitted on 04/29/15 for complaints of chest pain, swelling, and SOB.  Clinical Impression  Pt is a pleasant 59 year old male who was admitted for complaints of chest pain, swelling, and SOB. Pt performs bed mobility, transfers, and ambulation with modified independence with mobility performed with BRW and O2. Pt demonstrates deficits with endurance/complaince. Would benefit from skilled PT to address above deficits and promote optimal return to PLOF. Recommend transition to HHPT upon discharge from acute hospitalization. Pt reports he does not think he needs PT while in hospital.        Follow Up Recommendations Home health PT    Equipment Recommendations       Recommendations for Other Services       Precautions / Restrictions Precautions Precautions: None Restrictions Weight Bearing Restrictions: No      Mobility  Bed Mobility Overal bed mobility: Modified Independent             General bed mobility comments: safe technique performed  Transfers Overall transfer level: Modified independent Equipment used: Rolling walker (2 wheeled) (bariatric) Transfers: Sit to/from Stand           General transfer comment: sit<>Stand with safe technique. Pt somewhat impulsive and needs cues to wait and don socks prior to performing transfer. Once standing, pt with no LOB and safe technique  Ambulation/Gait Ambulation/Gait assistance: Modified independent (Device/Increase time) Ambulation Distance (Feet): 100 Feet Assistive device: Rolling walker (2 wheeled)       General Gait Details: ambulated using reciprocal gait pattern and safe technique. Pt with good speed. All mobility performed while on 3L of O2 with sats WNL. Pt refused to ambulate further, however was not fatigued at end of gait distance.  Stairs            Wheelchair Mobility    Modified Rankin (Stroke Patients Only)       Balance Overall balance assessment: Modified Independent Sitting-balance support: No upper extremity supported Sitting balance-Leahy Scale: Normal     Standing balance support: Bilateral upper extremity supported Standing balance-Leahy Scale: Fair                               Pertinent Vitals/Pain Pain Assessment: 0-10 Pain Score: 6  Pain Location: low back Pain Descriptors / Indicators: Discomfort Pain Intervention(s): Limited activity within patient's tolerance    Home Living Family/patient expects to be discharged to:: Private residence Living Arrangements: Spouse/significant other Available Help at Discharge: Family Type of Home: Mobile home Home Access: Ramped entrance     Home Layout: One level Home Equipment: Walker - standard;Wheelchair - Lawyer Comments: No BSC, tub shower with seat    Prior Function Level of Independence: Independent with assistive device(s)         Comments: Household ambulator with standard walker occasionally     Hand Dominance        Extremity/Trunk Assessment   Upper Extremity Assessment: Overall WFL for tasks assessed           Lower Extremity Assessment: Overall WFL for tasks assessed  Communication      Cognition Arousal/Alertness: Awake/alert Behavior During Therapy: WFL for tasks assessed/performed Overall Cognitive Status: Within Functional Limits for tasks assessed                      General Comments General comments (skin integrity, edema, etc.): O2 sats reduced in bed to 88%, however improve with upright posture and  ambulation. All mobility performed on 3L of O2.    Exercises        Assessment/Plan    PT Assessment Patient needs continued PT services  PT Diagnosis Difficulty walking;Abnormality of gait;Generalized weakness   PT Problem List Decreased activity tolerance;Decreased mobility;Obesity  PT Treatment Interventions DME instruction;Gait training;Functional mobility training   PT Goals (Current goals can be found in the Care Plan section) Acute Rehab PT Goals Patient Stated Goal: Walk with more energy PT Goal Formulation: With patient Time For Goal Achievement: 05/16/15 Potential to Achieve Goals: Good    Frequency Min 2X/week   Barriers to discharge        Co-evaluation               End of Session Equipment Utilized During Treatment: Gait belt;Oxygen Activity Tolerance: Patient tolerated treatment well Patient left: in bed Nurse Communication: Mobility status         Time: 0981-1914 PT Time Calculation (min) (ACUTE ONLY): 14 min   Charges:   PT Evaluation $Initial PT Evaluation Tier I: 1 Procedure     PT G Codes:        Tyler [name] 2015/05/28, 11:03 AM  Tyler [name], PT, DPT [phone]

## 2015-05-02 NOTE — Progress Notes (Signed)
[facility] Cardiology [facility] Encounter Note  Patient: AEON KESSNER / Admit Date: 04/29/2015 / Date of Encounter: 05/02/2015, 7:51 AM   Subjective: Slight diuresis but continued with shortness of breath and significant lower extremity edema patient now compliant with CPAP machine and tolerating it well  Review of Systems: Positive for: Lower extremity edema and shortness of breath Negative for: Vision change, hearing change, syncope, dizziness, nausea, vomiting,diarrhea, bloody stool, stomach pain, cough, congestion, diaphoresis, urinary frequency, urinary pain,skin lesions, skin rashes Others previously listed  Objective: Telemetry: Atrial fibrillation with slow ventricular rate Physical Exam: Blood pressure 113/65, pulse 71, temperature 98 F (36.7 C), temperature source Oral, resp. rate 20, height 6' (1.829 m), weight 372 lb 12.8 oz (169.101 kg), SpO2 96 %. Body mass index is 50.55 kg/(m^2). General: Well developed, well nourished, in no acute distress. Head: Normocephalic, atraumatic, sclera non-icteric, no xanthomas, nares are without discharge. Neck: No apparent masses Lungs: Normal respirations with few wheezes, diffuse rhonchi, no rales , basilar crackles   Heart: Irregular rate and rhythm, normal S1 S2, no murmur, no rub, no gallop, PMI is diffuse, carotid upstroke normal without bruit, jugular venous pressure normal Abdomen: Soft, non-tender,  distended with normoactive bowel sounds. Not assess abdominal aorta Extremities: No edema, no clubbing, no cyanosis, no ulcers,  Peripheral: 2+ radial, 0 + femoral, 0 + dorsal pedal pulses Neuro: Alert and oriented. Moves all extremities spontaneously. Psych:  Responds to questions appropriately with a normal affect.   Intake/Output Summary (Last 24 hours) at 05/02/15 0751 Last data filed at 05/02/15 0648  Gross per 24 hour  Intake  641.2 ml  Output   1036 ml  Net -394.8 ml    Inpatient Medications:  . cyanocobalamin   1,000 mcg Intramuscular Q30 days  . gabapentin  100 mg Oral BID  . heparin  5,000 Units Subcutaneous 3 times per day  . insulin aspart  0-9 Units Subcutaneous TID WC  . levothyroxine  100 mcg Oral QAC breakfast  . mirtazapine  30 mg Oral QHS  . PARoxetine  20 mg Oral Daily  . polyethylene glycol  17 g Oral Daily  . potassium chloride SA  20 mEq Oral QID  . pravastatin  40 mg Oral QHS  . salmeterol  1 puff Inhalation Q12H  . sodium chloride  3 mL Intravenous Q12H  . spironolactone  25 mg Oral Daily  . tamsulosin  0.4 mg Oral Daily  . vitamin B-12  2,000 mcg Oral Daily  . warfarin  10 mg Oral Once per day on Mon Fri  . warfarin  5 mg Oral Once per day on Sun Tue Wed Thu Sat  . Warfarin - Physician Dosing Inpatient   Does not apply q1800   Infusions:  . furosemide (LASIX) infusion 4 mg/hr (05/01/15 2030)    Labs:  Recent Labs  05/01/15 0457 05/02/15 0424  NA 135 134*  K 3.1* 3.4*  CL 93* 93*  CO2 33* 31  GLUCOSE 78 168*  BUN 36* 35*  CREATININE 1.59* 1.53*  CALCIUM 8.2* 8.3*    Recent Labs  05/01/15 0457 05/02/15 0424  AST 24 25  ALT 9* 9*  ALKPHOS 197* 178*  BILITOT 0.5 0.6  PROT 6.2* 6.4*  ALBUMIN 2.6* 2.6*    Recent Labs  05/01/15 0457 05/02/15 0424  WBC 5.3 5.0  NEUTROABS 3.6 3.2  HGB 9.8* 10.3*  HCT 30.1* 31.0*  MCV 81.9 83.3  PLT 226 236    Recent Labs  04/29/15 1203  04/29/15 1915 04/29/15 2244 04/30/15 0236  TROPONINI <0.03 <0.03 <0.03 <0.03   Invalid input(s): POCBNP No results for input(s): HGBA1C in the last 72 hours.   Weights: Filed Weights   04/30/15 0544 05/01/15 0526 05/02/15 0416  Weight: 381 lb 6.4 oz (173.002 kg) 380 lb 11.2 oz (172.684 kg) 372 lb 12.8 oz (169.101 kg)     Radiology/Studies:  Dg Chest 2 View  04/17/2015   CLINICAL DATA:  59 year old male with a history of COPD and chronic respiratory failure. Abdominal pain.  EXAM: CHEST - 2 VIEW  COMPARISON:  04/13/2015, 04/11/2015, 04/09/2015, CT chest 04/05/2015   FINDINGS: Cardiomediastinal silhouette unchanged. Left heart border partially obscured by overlying lung and pleural disease.  Fullness in the central hilar vessels, with interlobular septal thickening bilaterally.  Opacity at the left base appears similar to comparison plain film on both AP and lateral views.  Lucency at the margin of the cardiac silhouette on the lateral view, retrosternal window, worsened from the comparison plain film.  No displaced fracture.  Unremarkable appearance of the upper abdomen.  IMPRESSION: There appears to be enlarging pericardial effusion compared to the prior plain film. Correlation with echocardiography recommended to evaluate for cardiac output compromise given the history of respiratory failure.  Persisting low lung volumes with mild interstitial edema and likely atelectasis at the left base, or less likely pneumonia/pneumonitis.  These results were called by telephone at the time of interpretation on 04/17/2015 at 7:51 am to the nurse caring for the patient, Ms Dorita Fray, who verbally acknowledged these results.  Signed,  Yvone Neu. Loreta Ave, DO  [facility]  [facility]   Electronically Signed   By: Gilmer Mor D.O.   On: 04/17/2015 07:52   Dg Chest 2 View  04/13/2015   CLINICAL DATA:  History of CHF, no current chest complaints  EXAM: CHEST - 2 VIEW  COMPARISON:  04/11/2015  FINDINGS: Cardiac shadow remains enlarged. Vascular congestion is again identified with mild interstitial edema. Persistent left basilar opacity is noted. No new focal abnormality is seen.  IMPRESSION: Stable CHF.  Persistent left basilar opacity without significant change from the prior exam.   Electronically Signed   By: Alcide Clever M.D.   On: 04/13/2015 08:18   Dg Chest 2 View  04/11/2015   CLINICAL DATA:  CHF, COPD -asthma, diabetes, morbid obesity.  EXAM: CHEST  2 VIEW  COMPARISON:  Portable chest x-ray of April 09, 2015  FINDINGS: The right lung  is adequately inflated. There is no focal infiltrate. The interstitial markings are coarse and slightly more conspicuous today. On the left there is slight interval improvement in left lower lobe atelectasis or pneumonia. The left hemidiaphragm is faintly visible and the left heart border is slightly better visualized. A pleural effusion may be present. The trachea is midline. The cardiac silhouette where visualized is enlarged. The pulmonary vascularity is engorged.  IMPRESSION: 1. Slight interval improvement in aeration of the left lower lobe may reflect resolving atelectasis or pneumonia. 2. CHF with pulmonary interstitial edema slightly more conspicuous today.   Electronically Signed   By: David  [place] M.D.   On: 04/11/2015 07:41   Ct Chest Wo Contrast  04/05/2015   CLINICAL DATA:  Chronic respiratory failure  EXAM: CT CHEST WITHOUT CONTRAST  TECHNIQUE: Multidetector CT imaging of the chest was performed following the standard protocol without IV contrast.  COMPARISON:  None  FINDINGS: Mediastinum: The heart size is mildly enlarged. There is a small  to moderate pericardial effusion noted. This is similar to 05/30/2014. Aortic atherosclerosis identified. The trachea appears patent and is midline. Normal appearance of the esophagus. Similar appearance of prominent mediastinal lymph nodes. The largest is in the high right paratracheal region measuring 11 mm, image 11/series 2.  Lungs/Pleura: There are small bilateral pleural effusions. Atelectasis is noted in the left lung day stress set atelectasis is identified within the superior segment of both lower lobes. No lobar consolidation. Chronic mosaic attenuation pattern is identified in both lungs, with patchy areas of ground-glass attenuation in both lungs. The appearance is similar to previous exam. There is no airspace consolidation identified.  Upper Abdomen: No focal liver or splenic abnormality. There is moderate ascites within the upper abdomen. The  adrenal glands are unremarkable.  Musculoskeletal: Mild multi level degenerative disc disease identified within the thoracic spine.  IMPRESSION: 1. Mild cardiac enlargement and pericardial effusion is again noted and appears similar to previous exam 2. Aortic atherosclerosis 3. Patchy areas of ground-glass attenuation are again identified in both lungs. This is a finding which is been present dating back to 2012 is likely related to underlying interstitial lung disease such as NSIP. 4. Small bilateral pleural effusions and  left base scarring.   Electronically Signed   By: Signa Kell M.D.   On: 04/05/2015 16:10   Ct Abdomen Pelvis W Contrast  04/09/2015   CLINICAL DATA:  Constipation and abdominal pain, 40 pound weight gain in last 2 months  EXAM: CT ABDOMEN AND PELVIS WITH CONTRAST  TECHNIQUE: Multidetector CT imaging of the abdomen and pelvis was performed using the standard protocol following bolus administration of intravenous contrast.  CONTRAST:  OMNIPAQUE IOHEXOL 350 MG/ML SOLN  COMPARISON:  CT scan 05/09/2014  FINDINGS: Lower chest: Moderate to large pericardial effusion, increased when compared to prior study. Small bilateral pleural effusions new or increased when compared to prior study. Visualized portions of the lung bases clear except for atelectasis left base.  Hepatobiliary: Status post cholecystectomy. No focal hepatic abnormalities.  Pancreas: Normal  Spleen: Normal  Adrenals/Urinary Tract: Adrenal glands are normal. Bilateral renal atrophy noted. Kidneys otherwise negative.  Stomach/Bowel: Negative  Vascular/Lymphatic: Inferior vena cava filter noted.  Reproductive: Appear to be absent  Other: Study limited by body habitus. Diffuse increased subcutaneous soft tissue attenuation suggesting anasarca. Moderate volume of ascites.  Musculoskeletal: Mild compression deformity of T11 appears mildly progressive when compared to prior study. No evidence of retropulsion.  IMPRESSION:  Pericardial and pleural effusions with anasarca and ascites.  Mild interval progression of a mild T11 compression deformity.   Electronically Signed   By: Esperanza Heir M.D.   On: 04/09/2015 22:03   US Renal  04/11/2015   CLINICAL DATA:  Acute renal failure.  EXAM: RENAL / URINARY TRACT ULTRASOUND COMPLETE  COMPARISON:  CT 04/09/2015  FINDINGS: Right Kidney:  Length: 16.4 cm. Echogenicity within normal limits. No mass or hydronephrosis visualized.  Left Kidney:  Length: 15.3 cm. Echogenicity within normal limits. No mass or hydronephrosis visualized.  Bladder:  Not visualized  Ascites most pronounced within the right upper and right lower quadrants.  IMPRESSION: No hydronephrosis.  Bladder is not well visualized.  Ascites.   Electronically Signed   By: Annia Belt M.D.   On: 04/11/2015 16:53   US Abdomen Limited  04/10/2015   CLINICAL DATA:  Ascites.  EXAM: LIMITED ABDOMINAL ULTRASOUND  COMPARISON:  CT scan of April 09, 2015.  FINDINGS: Sonographic evaluation demonstrates mild ascites in the right  and left lower quadrants. However, due to the depth of these areas and the small fluid pockets present, paracentesis was not attempted.  IMPRESSION: Mild ascites is noted in the right and left lower quadrants.   Electronically Signed   By: Lupita Raider, M.D.   On: 04/10/2015 12:47   Dg Chest Portable 1 View  04/29/2015   CLINICAL DATA:  Left-sided chest pain today.  EXAM: PORTABLE CHEST - 1 VIEW  COMPARISON:  04/17/2015  FINDINGS: Enlargement of the cardiac silhouette flow most likely due to the previously demonstrated pericardial effusion. There is slight pulmonary vascular congestion. Increased density at the left lung base is most likely secondary to an increasing left effusion. Small right effusion.  IMPRESSION: Increasing moderate left effusion. Small right effusion. Slight pulmonary vascular congestion.   Electronically Signed   By: Francene Boyers M.D.   On: 04/29/2015 12:40   Dg Chest Portable 1  View  04/09/2015   CLINICAL DATA:  Chronic cough, with 40 lb weight gain. Initial encounter.  EXAM: PORTABLE CHEST - 1 VIEW  COMPARISON:  CT of the chest performed 04/05/2015, and chest radiograph performed 02/16/2015  FINDINGS: The lungs are well-aerated. A small left pleural effusion is noted. Vascular congestion is noted, with increased interstitial markings, concerning for mild pulmonary edema. There is no evidence of pneumothorax.  The cardiomediastinal silhouette is mildly enlarged. No acute osseous abnormalities are seen.  IMPRESSION: Small left pleural effusion noted. Vascular congestion and mild cardiomegaly, with increased interstitial markings, concerning for mild pulmonary edema.   Electronically Signed   By: Roanna Raider M.D.   On: 04/09/2015 23:28     Assessment and Recommendation  59 y.o. male with known severe combined systolic and diastolic dysfunction congestive heart failure most consistent with severe COPD hypoxia and sleep apnea with chronic kidney disease chronic nonvalvular atrial fibrillation and diabetes on appropriate medication management with slight diuresis but noncompliant with appropriate oxygenation and CPAP use to improve patient long-term and keep him out of the hospital. The patient appears to tolerate CPAP as a demonstration last night and shouldn't be more compliant in the future 1. Continue Lasix drip and changed to oral as able 2. Continue anticoagulation for further risk reduction in stroke with atrial fibrillation 3. Continue anticoagulation for deep venous thrombosis in the past and possible recurrence with concerns for recurrent pulmonary embolism 4. Continue aggressive CPAP use or further consideration of pulmonology and/or ENT for placement of tracheostomy and/or further aggressive treatment to reduce rehospitalization unless patient continues to the compliant with CPAP machine as last night 5. No further cardiac diagnostics at this time 6. Okay for discharge  home from cardiac standpoint 7. Call if further questions Signed, Arnoldo Hooker M.D. FACC

## 2015-05-03 DIAGNOSIS — I5033 Acute on chronic diastolic (congestive) heart failure: Secondary | ICD-10-CM | POA: Insufficient documentation

## 2015-05-03 LAB — CBC WITH DIFFERENTIAL/PLATELET
Basophils Absolute: 0.1 10*3/uL (ref 0–0.1)
Basophils Relative: 1 %
EOS ABS: 0.2 10*3/uL (ref 0–0.7)
EOS PCT: 3 %
HCT: 30.6 % — ABNORMAL LOW (ref 40.0–52.0)
Hemoglobin: 10.2 g/dL — ABNORMAL LOW (ref 13.0–18.0)
Lymphocytes Relative: 19 %
Lymphs Abs: 0.9 10*3/uL — ABNORMAL LOW (ref 1.0–3.6)
MCH: 27.4 pg (ref 26.0–34.0)
MCHC: 33.3 g/dL (ref 32.0–36.0)
MCV: 82.3 fL (ref 80.0–100.0)
Monocytes Absolute: 0.6 10*3/uL (ref 0.2–1.0)
Monocytes Relative: 13 %
NEUTROS ABS: 3.1 10*3/uL (ref 1.4–6.5)
Neutrophils Relative %: 64 %
Platelets: 216 10*3/uL (ref 150–440)
RBC: 3.72 MIL/uL — ABNORMAL LOW (ref 4.40–5.90)
RDW: 16.5 % — AB (ref 11.5–14.5)
WBC: 4.8 10*3/uL (ref 3.8–10.6)

## 2015-05-03 LAB — COMPREHENSIVE METABOLIC PANEL
ALBUMIN: 2.7 g/dL — AB (ref 3.5–5.0)
ALK PHOS: 205 U/L — AB (ref 38–126)
ALT: 10 U/L — AB (ref 17–63)
AST: 27 U/L (ref 15–41)
Anion gap: 8 (ref 5–15)
BUN: 37 mg/dL — ABNORMAL HIGH (ref 6–20)
CO2: 32 mmol/L (ref 22–32)
Calcium: 8.5 mg/dL — ABNORMAL LOW (ref 8.9–10.3)
Chloride: 95 mmol/L — ABNORMAL LOW (ref 101–111)
Creatinine, Ser: 1.56 mg/dL — ABNORMAL HIGH (ref 0.61–1.24)
GFR calc Af Amer: 54 mL/min — ABNORMAL LOW (ref >60)
GFR calc non Af Amer: 47 mL/min — ABNORMAL LOW (ref >60)
Glucose, Bld: 170 mg/dL — ABNORMAL HIGH (ref 65–99)
POTASSIUM: 3.9 mmol/L (ref 3.5–5.1)
SODIUM: 135 mmol/L (ref 135–145)
TOTAL PROTEIN: 6.4 g/dL — AB (ref 6.5–8.1)
Total Bilirubin: 0.7 mg/dL (ref 0.3–1.2)

## 2015-05-03 LAB — PROTIME-INR
INR: 1.78
Prothrombin Time: 20.9 seconds — ABNORMAL HIGH (ref 11.4–15.0)

## 2015-05-03 LAB — GLUCOSE, CAPILLARY: Glucose-Capillary: 200 mg/dL — ABNORMAL HIGH (ref 65–99)

## 2015-05-03 MED ORDER — TORSEMIDE 20 MG PO TABS
100.0000 mg | ORAL_TABLET | Freq: Every day | ORAL | Status: DC
  Administered 2015-05-03: 100 mg via ORAL
  Filled 2015-05-03: qty 5

## 2015-05-03 MED ORDER — LACTULOSE 10 GM/15ML PO SOLN
30.0000 g | Freq: Two times a day (BID) | ORAL | Status: DC
  Filled 2015-05-03: qty 60

## 2015-05-03 MED ORDER — POTASSIUM CHLORIDE CRYS ER 20 MEQ PO TBCR: 20.0000 meq | EXTENDED_RELEASE_TABLET | Freq: Four times a day (QID) | ORAL | Status: DC

## 2015-05-03 MED ORDER — TORSEMIDE 100 MG PO TABS: 100.0000 mg | ORAL_TABLET | Freq: Every day | ORAL | Status: DC

## 2015-05-03 NOTE — Consult Note (Signed)
Palliative Medicine Inpatient Consult Note   Name: Tyler [name] Date: 05/03/2015 MRN: [id number]  DOB: [birth year]  Referring Physician: Dr [name]  Palliative Care consult requested for this 59 y.o. male for goals of medical therapy in patient with massive fluid overload due to diastolic heart failure.  [name] says he gained '18 lbs in a week'.    TODAY'S DISCUSSION: Today I met with patient and his wife. I had reviewed the chart and I examine patient.  [name] had numerous questions and I did try to address some of them as I felt this would be helpful in terms of discussing goals of care. She was very pleasant. She had many questions such as the following:  --She wanted to know what was 'really wrong with him'.  --She said she hears different things from different doctors and no one is saying what is the one thing that is wrong. Is it his heart? If if beats OK, then how can it be his heart? etc --She wanted to know what the reason was for him getting an IVC filter last Sep 2015. --Michela Pitcher 'he is on too many meds'. --She wanted to know why he was 'taken off of Thurosamide and put on Furosemide'--- (?) --She says 'he is no better now than when he came in"  --She also says he will not stay longer and that he cannot go to a facility due to insurance and wants to know how he will get better at home.   The patient, himself, yelled his answers to me a couple of times --and yelled in an angry manner, not just so I could hear him over the CPAP noise. He had covers over his head for much of the visit. He had eyes closed when I politely asked if he could open his eyes as I attempted to explain diastolic heart failure using gestures. He hollered at me angrily saying "[place]!" At times, he seemed to be listening (somewhat)--- but he rarely spoke.   When I (very pleasantly) brought up code status, he responded, "I don't want to be bothered with that question!" His  wife says they have talked about things like this and she reports that he does want to have CPR 'Tried' etc ---but he doesn't want to 'be a vegetable'.   His wife says 'people think he smokes more than he does'. She says he went to little cigars and only smokes one every few days. She says they do not use salt and she only buys canned foods that have no salt.   I think that there is a huge gap between how much is wrong with this gentleman, and how much both he and his wife understand about it all. He did not seem receptive to education. His wife does seem receptive, but she has a lot of questions about everything and it is hard to discuss only the important issues with her--- since every issue is important to her. He will have an appointment at [facility] on Aug 25th. I hope that his wife can go with him to that appointment.   I do not feel that either of them has an appreciation that he actually has a significantly limited lifespan due to his heart failure. Neither of them understands what is wrong. Its not cancer --they could understand that. It seems to be something they cannot grasp of think of in tangible ways. They seem to want it fixed. I mentioned 'managing it' instead  of 'fixing it'-- but that led to the wife bringing up up 'thurosamide vs furosemide'. It is hard to get to the point where one can discuss goals of care when the patient cannot be' bothered' with important questions such as those related to code status.   I am told he is going home in the morning. I truly wish I could have helped more.  He remains full code and goals of care seem to be to have all that is wrong with him corrected as quickly as possible.   Some of patient's attitude is likely a protective measure as he does not want to acknowledge 'bad news'.    REVIEW OF SYSTEMS:  He would not engage in discussions.  SUPPORT SYSTEM: His wife is very supportive.   SOCIAL HISTORY:  reports  that he has been smoking Cigarettes and Cigars.  He has a 60 pack-year smoking history. He has never used smokeless tobacco. He reports that he does not drink alcohol or use illicit drugs.  His wife says he smokes a 'little cigar' about every 2 or 3 days.   LEGAL DOCUMENTS:  None in hard chart/  CODE STATUS: Full code  -Patient said, " I do not want to be BOTHERED by this question"    PAST MEDICAL HISTORY: Past Medical History  Diagnosis Date  . Cataract   . CHF (congestive heart failure)   . COPD (chronic obstructive pulmonary disease)   . Diabetes mellitus without complication   . Sleep apnea   . Oxygen deficiency   . Thyroid disease   . Gout   . Hypothyroidism   . CKD (chronic kidney disease) stage 3, GFR 30-59 ml/min   . Asthma   . Hypertension     PAST SURGICAL HISTORY:  Past Surgical History  Procedure Laterality Date  . Cholecystectomy      2002  . Eye surgery      right cataract removed 3/21  . Vena cava filter placement      ALLERGIES:  is allergic to enalapril; atorvastatin; and lovastatin.  MEDICATIONS:  No current facility-administered medications for this encounter.   Current Outpatient Prescriptions  Medication Sig Dispense Refill  . colchicine 0.6 MG tablet Take 0.6 mg by mouth 2 (two) times daily as needed (for wrist pain).    . cyanocobalamin (,VITAMIN B-12,) 1000 MCG/ML injection Inject 1,000 mcg into the muscle every 30 (thirty) days.    Marland Kitchen gabapentin (NEURONTIN) 100 MG capsule Take 100 mg by mouth 2 (two) times daily.    . insulin NPH Human (HUMULIN N,NOVOLIN N) 100 UNIT/ML injection Inject 80 Units into the skin 2 (two) times daily before a meal.    . insulin regular (NOVOLIN R,HUMULIN R) 100 units/mL injection Inject 10 Units into the skin 2 (two) times daily before a meal.    . ipratropium-albuterol (DUONEB) 0.5-2.5 (3) MG/3ML SOLN Take 3 mLs by nebulization every 4 (four) hours as needed. (Patient taking differently: Take 3 mLs by nebulization  every 4 (four) hours as needed (for shortness of breath). ) 360 mL 5  . lactulose (CHRONULAC) 10 GM/15ML solution Take 30 g by mouth 2 (two) times daily as needed for mild constipation.     Marland Kitchen levothyroxine (SYNTHROID, LEVOTHROID) 100 MCG tablet Take 100 mcg by mouth daily before breakfast.    . metolazone (ZAROXOLYN) 2.5 MG tablet Take 1 tablet (2.5 mg total) by mouth daily. 30 tablet 5  . mirtazapine (REMERON) 30 MG tablet Take 1 tablet (30 mg total)  by mouth at bedtime. 30 tablet 5  . nystatin (MYCOSTATIN/NYSTOP) 100000 UNIT/GM POWD Apply 1 g topically daily as needed (for rash).    . ondansetron (ZOFRAN) 4 MG tablet Take 1 tablet (4 mg total) by mouth every 6 (six) hours as needed for nausea. 20 tablet 0  . PARoxetine (PAXIL) 20 MG tablet Take 20 mg by mouth daily.    . polyethylene glycol (MIRALAX / GLYCOLAX) packet Take 17 g by mouth daily.    . pravastatin (PRAVACHOL) 40 MG tablet Take 40 mg by mouth at bedtime.    . salmeterol (SEREVENT) 50 MCG/DOSE diskus inhaler Inhale 1 puff into the lungs every 12 (twelve) hours. 1 Inhaler 12  . spironolactone (ALDACTONE) 25 MG tablet Take 25 mg by mouth daily.    . tamsulosin (FLOMAX) 0.4 MG CAPS capsule Take 1 capsule (0.4 mg total) by mouth daily. 30 capsule 5  . Tiotropium Bromide-Olodaterol 2.5-2.5 MCG/ACT AERS Inhale 1 puff into the lungs daily as needed (for wheezing and shortness of breath).     . vitamin B-12 (CYANOCOBALAMIN) 1000 MCG tablet Take 2,000 mcg by mouth daily.    Marland Kitchen warfarin (COUMADIN) 10 MG tablet Take 5-10 mg by mouth daily. Pt takes one tablet on Monday and Friday.   Pt takes one-half tablet on Tuesday, Wednesday, Thursday, Saturday, and Sunday.    . potassium chloride SA (K-DUR,KLOR-CON) 20 MEQ tablet Take 1 tablet (20 mEq total) by mouth 4 (four) times daily. 120 tablet 5  . torsemide (DEMADEX) 100 MG tablet Take 1 tablet (100 mg total) by mouth daily. 30 tablet 5    Vital Signs: BP 91/55 mmHg  Pulse 58  Temp(Src) 98 F  (36.7 C) (Oral)  Resp 20  Ht 6' (1.829 m)  Wt 169.464 kg (373 lb 9.6 oz)  BMI 50.66 kg/m2  SpO2 97% Filed Weights   05/01/15 0526 05/02/15 0416 05/03/15 0503  Weight: 172.684 kg (380 lb 11.2 oz) 169.101 kg (372 lb 12.8 oz) 169.464 kg (373 lb 9.6 oz)    Estimated body mass index is 50.66 kg/(m^2) as calculated from the following:   Height as of this encounter: 6' (1.829 m).   Weight as of this encounter: 169.464 kg (373 lb 9.6 oz).  PERFORMANCE STATUS (ECOG) : 1 - Symptomatic but completely ambulatory  PHYSICAL EXAM: Lying in bed on his side with CPAP in place.  Covers over his head No JVD or TM Hrt rrr no mgr Lungs cta , no access muscle use Abd very morbidly obese, nl BS Ext no c/c/e Skin warm and dry Alert and angry and evasive of conversation and eye contact.  LABS: CBC:    Component Value Date/Time   WBC 4.8 05/03/2015 0446   WBC 5.8 10/29/2014 0815   HGB 10.2* 05/03/2015 0446   HGB 12.9* 10/29/2014 0815   HCT 30.6* 05/03/2015 0446   HCT 40.0 10/29/2014 0815   PLT 216 05/03/2015 0446   PLT 221 10/29/2014 0815   MCV 82.3 05/03/2015 0446   MCV 86 10/29/2014 0815   NEUTROABS 3.1 05/03/2015 0446   NEUTROABS 4.0 10/29/2014 0815   LYMPHSABS 0.9* 05/03/2015 0446   LYMPHSABS 1.1 10/29/2014 0815   MONOABS 0.6 05/03/2015 0446   MONOABS 0.5 10/29/2014 0815   EOSABS 0.2 05/03/2015 0446   EOSABS 0.1 10/29/2014 0815   BASOSABS 0.1 05/03/2015 0446   BASOSABS 0.1 10/29/2014 0815   Comprehensive Metabolic Panel:    Component Value Date/Time   NA 135 05/03/2015 0446   NA  136 08/21/2014 1613   K 3.9 05/03/2015 0446   K 3.7 08/21/2014 1613   CL 95* 05/03/2015 0446   CL 98 08/21/2014 1613   CO2 32 05/03/2015 0446   CO2 30 08/21/2014 1613   BUN 37* 05/03/2015 0446   BUN 25* 08/21/2014 1613   CREATININE 1.56* 05/03/2015 0446   CREATININE 1.73* 08/21/2014 1613   GLUCOSE 170* 05/03/2015 0446   GLUCOSE 215* 08/21/2014 1613   CALCIUM 8.5* 05/03/2015 0446   CALCIUM 8.5  08/21/2014 1613   AST 27 05/03/2015 0446   AST 25 08/21/2014 1613   ALT 10* 05/03/2015 0446   ALT 13* 08/21/2014 1613   ALKPHOS 205* 05/03/2015 0446   ALKPHOS 330* 08/21/2014 1613   BILITOT 0.7 05/03/2015 0446   BILITOT 1.2* 08/21/2014 1613   PROT 6.4* 05/03/2015 0446   PROT 7.2 08/21/2014 1613   ALBUMIN 2.7* 05/03/2015 0446   ALBUMIN 2.8* 08/21/2014 1613    IMPRESSION:  Anasarca --no nephtrotic range proteinuria or hepatic dysfunction --could have worsening pulmonary HTN  --due to chronic diastolic dysfunction  Chronic diastolic CHF CKD tage 3 COPD w/o exacerbation DM2 on insulin Hypothyroidism Gout w/o acute attack Dyslipidemia Presumed OSA --for outpt sleep study H/O DVT  Abdominal Pain Medical Noncompliance  PLAN: He is to go home in the morning. See above for details of palliative conversation.   More than 50% of the visit was spent in counseling/coordination of care: Yes  Time Spent:70  minutes

## 2015-05-03 NOTE — Clinical Social Work Note (Signed)
RN CM informed CSW this morning that while he was talking to patient's insurance case manager at [facility] this morning, that she stated that they would approve patient to go to SNF for a nursing need. CSW went back to speak with patient and his wife this morning and he was having a difficult time deciding. CSW informed patient and wife that CSW would be back in a little bit to give them time to discuss it further and make a decision. When CSW returned, patient was irate, cursing, and yelling. Patient informed CSW that he was upset with nursing and wanted to get out of the hospital. CSW left patient's room due to patient's behavior and patients' wife followed and stated that if nursing didn't come into the room to discharge him now, that patient would pull his IV and leave. CSW relayed this information to patient's nurse.  York Spaniel MSW,LCSW [phone]

## 2015-05-03 NOTE — Progress Notes (Signed)
Pt. Discharged via wc. Discharge instructions and medication regimen reviewed at bedside with patients wife, patient refused any discharge teaching. Wife verbalizes understanding of instructions and medication regimen. Patient assessment unchanged from this morning. IV discontinued per policy. Patient preemptively removed telemetry prior to discharge and refused reapplication.

## 2015-05-03 NOTE — Plan of Care (Signed)
Problem: Phase II Progression Outcomes Goal: Fluid volume status improved Outcome: Not Progressing Pt is very edematous, abdomen is distended. Pt complains of feeling blaoted.

## 2015-05-03 NOTE — Progress Notes (Signed)
Patient took self off telemetry monitor. RN went to put it back on but patient yelled at nurse, "Im going to get out of this damn place, dont you come tell me to put this back on." Dr. Judithann Sheen paged and aware.

## 2015-05-03 NOTE — Discharge Summary (Signed)
Tyler [name], is a 59 y.o. male  DOB [birth year]  MRN [id number].  Admission date:  04/29/2015  Admitting Physician  Houston Siren, MD  Discharge Date:  05/03/2015   Primary MD  Telesha Deguzman D, MD  Recommendations for primary care physician for things to follow:   none   Admission Diagnosis  Hyponatremia [E87.1] Pleural effusion [J90] Acute exacerbation of CHF (congestive heart failure) [I50.9]   Discharge Diagnosis  Hyponatremia [E87.1] Pleural effusion [J90] Acute exacerbation of CHF (congestive heart failure) [I50.9]  Morbid obesity, chronic respiratory failure, anasarca, CKD, DM  Active Problems:   CHF (congestive heart failure)      Past Medical History  Diagnosis Date  . Cataract   . CHF (congestive heart failure)   . COPD (chronic obstructive pulmonary disease)   . Diabetes mellitus without complication   . Sleep apnea   . Oxygen deficiency   . Thyroid disease   . Gout   . Hypothyroidism   . CKD (chronic kidney disease) stage 3, GFR 30-59 ml/min   . Asthma   . Hypertension     Past Surgical History  Procedure Laterality Date  . Cholecystectomy      2002  . Eye surgery      right cataract removed 3/21  . Vena cava filter placement         History of present illness and  Hospital Course:     Kindly see H&P for history of present illness and admission details, please review complete Labs, Consult reports and Test reports for all details in brief  HPI  from the history and physical done on the day of admission    Hospital Course    Pt admitted with recurrent volume overload and diuresed with IV lasix. Improved.   Discharge Condition: stable   Follow UP  Follow-up Information    Follow up with Blonnie Maske D, MD In 5 days.   Specialty:  Internal Medicine   Contact information:   [address] [place] [address] [phone]          Discharge Instructions  and  Discharge Medications   See below  Discharge Instructions    AMB referral to CHF clinic    Complete by:  As directed             Medication List    STOP taking these medications        furosemide 80 MG tablet  Commonly known as:  LASIX     potassium chloride 10 MEQ tablet  Commonly known as:  K-DUR  Replaced by:  potassium chloride SA 20 MEQ tablet      TAKE these medications        colchicine 0.6 MG tablet  Take 0.6 mg by mouth 2 (two) times daily as needed (for wrist pain).     cyanocobalamin 1000 MCG/ML injection  Commonly known as:  (VITAMIN B-12)  Inject 1,000 mcg into the muscle every 30 (thirty) days.     vitamin B-12 1000 MCG  tablet  Commonly known as:  CYANOCOBALAMIN  Take 2,000 mcg by mouth daily.     gabapentin 100 MG capsule  Commonly known as:  NEURONTIN  Take 100 mg by mouth 2 (two) times daily.     insulin NPH Human 100 UNIT/ML injection  Commonly known as:  HUMULIN N,NOVOLIN N  Inject 80 Units into the skin 2 (two) times daily before a meal.     insulin regular 100 units/mL injection  Commonly known as:  NOVOLIN R,HUMULIN R  Inject 10 Units into the skin 2 (two) times daily before a meal.     ipratropium-albuterol 0.5-2.5 (3) MG/3ML Soln  Commonly known as:  DUONEB  Take 3 mLs by nebulization every 4 (four) hours as needed.     lactulose 10 GM/15ML solution  Commonly known as:  CHRONULAC  Take 30 g by mouth 2 (two) times daily as needed for mild constipation.     levothyroxine 100 MCG tablet  Commonly known as:  SYNTHROID, LEVOTHROID  Take 100 mcg by mouth daily before breakfast.     metolazone 2.5 MG tablet  Commonly known as:  ZAROXOLYN  Take 1 tablet (2.5 mg total) by mouth daily.     mirtazapine 30 MG tablet  Commonly known as:  REMERON  Take 1 tablet (30 mg total) by mouth at bedtime.     nystatin 100000 UNIT/GM Powd  Apply 1 g topically daily as needed (for rash).     ondansetron 4 MG  tablet  Commonly known as:  ZOFRAN  Take 1 tablet (4 mg total) by mouth every 6 (six) hours as needed for nausea.     PARoxetine 20 MG tablet  Commonly known as:  PAXIL  Take 20 mg by mouth daily.     polyethylene glycol packet  Commonly known as:  MIRALAX / GLYCOLAX  Take 17 g by mouth daily.     potassium chloride SA 20 MEQ tablet  Commonly known as:  K-DUR,KLOR-CON  Take 1 tablet (20 mEq total) by mouth 4 (four) times daily.     pravastatin 40 MG tablet  Commonly known as:  PRAVACHOL  Take 40 mg by mouth at bedtime.     salmeterol 50 MCG/DOSE diskus inhaler  Commonly known as:  SEREVENT  Inhale 1 puff into the lungs every 12 (twelve) hours.     spironolactone 25 MG tablet  Commonly known as:  ALDACTONE  Take 25 mg by mouth daily.     tamsulosin 0.4 MG Caps capsule  Commonly known as:  FLOMAX  Take 1 capsule (0.4 mg total) by mouth daily.     Tiotropium Bromide-Olodaterol 2.5-2.5 MCG/ACT Aers  Inhale 1 puff into the lungs daily as needed (for wheezing and shortness of breath).     torsemide 100 MG tablet  Commonly known as:  DEMADEX  Take 1 tablet (100 mg total) by mouth daily.     warfarin 10 MG tablet  Commonly known as:  COUMADIN  Take 5-10 mg by mouth daily. Pt takes one tablet on Monday and Friday.   Pt takes one-half tablet on Tuesday, Wednesday, Thursday, Saturday, and Sunday.          Diet and Activity recommendation: See Discharge Instructions above   Consults obtained - Neprhrology, Cardiology, PT, CSW, CM, PC   Major procedures and Radiology Reports - PLEASE review detailed and final reports for all details, in brief -   See below   Dg Chest 2 View  04/17/2015   CLINICAL  DATA:  60 year old male with a history of COPD and chronic respiratory failure. Abdominal pain.  EXAM: CHEST - 2 VIEW  COMPARISON:  04/13/2015, 04/11/2015, 04/09/2015, CT chest 04/05/2015  FINDINGS: Cardiomediastinal silhouette unchanged. Left heart border partially obscured by  overlying lung and pleural disease.  Fullness in the central hilar vessels, with interlobular septal thickening bilaterally.  Opacity at the left base appears similar to comparison plain film on both AP and lateral views.  Lucency at the margin of the cardiac silhouette on the lateral view, retrosternal window, worsened from the comparison plain film.  No displaced fracture.  Unremarkable appearance of the upper abdomen.  IMPRESSION: There appears to be enlarging pericardial effusion compared to the prior plain film. Correlation with echocardiography recommended to evaluate for cardiac output compromise given the history of respiratory failure.  Persisting low lung volumes with mild interstitial edema and likely atelectasis at the left base, or less likely pneumonia/pneumonitis.  These results were called by telephone at the time of interpretation on 04/17/2015 at 7:51 am to the nurse caring for the patient, Ms Dorita Fray, who verbally acknowledged these results.  Signed,  Yvone Neu. Loreta Ave, DO  [facility]  [facility]   Electronically Signed   By: Gilmer Mor D.O.   On: 04/17/2015 07:52   Dg Chest 2 View  04/13/2015   CLINICAL DATA:  History of CHF, no current chest complaints  EXAM: CHEST - 2 VIEW  COMPARISON:  04/11/2015  FINDINGS: Cardiac shadow remains enlarged. Vascular congestion is again identified with mild interstitial edema. Persistent left basilar opacity is noted. No new focal abnormality is seen.  IMPRESSION: Stable CHF.  Persistent left basilar opacity without significant change from the prior exam.   Electronically Signed   By: Alcide Clever M.D.   On: 04/13/2015 08:18   Dg Chest 2 View  04/11/2015   CLINICAL DATA:  CHF, COPD -asthma, diabetes, morbid obesity.  EXAM: CHEST  2 VIEW  COMPARISON:  Portable chest x-ray of April 09, 2015  FINDINGS: The right lung is adequately inflated. There is no focal infiltrate. The interstitial markings are  coarse and slightly more conspicuous today. On the left there is slight interval improvement in left lower lobe atelectasis or pneumonia. The left hemidiaphragm is faintly visible and the left heart border is slightly better visualized. A pleural effusion may be present. The trachea is midline. The cardiac silhouette where visualized is enlarged. The pulmonary vascularity is engorged.  IMPRESSION: 1. Slight interval improvement in aeration of the left lower lobe may reflect resolving atelectasis or pneumonia. 2. CHF with pulmonary interstitial edema slightly more conspicuous today.   Electronically Signed   By: David  [place] M.D.   On: 04/11/2015 07:41   Ct Chest Wo Contrast  04/05/2015   CLINICAL DATA:  Chronic respiratory failure  EXAM: CT CHEST WITHOUT CONTRAST  TECHNIQUE: Multidetector CT imaging of the chest was performed following the standard protocol without IV contrast.  COMPARISON:  None  FINDINGS: Mediastinum: The heart size is mildly enlarged. There is a small to moderate pericardial effusion noted. This is similar to 05/30/2014. Aortic atherosclerosis identified. The trachea appears patent and is midline. Normal appearance of the esophagus. Similar appearance of prominent mediastinal lymph nodes. The largest is in the high right paratracheal region measuring 11 mm, image 11/series 2.  Lungs/Pleura: There are small bilateral pleural effusions. Atelectasis is noted in the left lung day stress set atelectasis is identified within the superior segment of both lower  lobes. No lobar consolidation. Chronic mosaic attenuation pattern is identified in both lungs, with patchy areas of ground-glass attenuation in both lungs. The appearance is similar to previous exam. There is no airspace consolidation identified.  Upper Abdomen: No focal liver or splenic abnormality. There is moderate ascites within the upper abdomen. The adrenal glands are unremarkable.  Musculoskeletal: Mild multi level degenerative disc  disease identified within the thoracic spine.  IMPRESSION: 1. Mild cardiac enlargement and pericardial effusion is again noted and appears similar to previous exam 2. Aortic atherosclerosis 3. Patchy areas of ground-glass attenuation are again identified in both lungs. This is a finding which is been present dating back to 2012 is likely related to underlying interstitial lung disease such as NSIP. 4. Small bilateral pleural effusions and  left base scarring.   Electronically Signed   By: Signa Kell M.D.   On: 04/05/2015 16:10   Ct Abdomen Pelvis W Contrast  04/09/2015   CLINICAL DATA:  Constipation and abdominal pain, 40 pound weight gain in last 2 months  EXAM: CT ABDOMEN AND PELVIS WITH CONTRAST  TECHNIQUE: Multidetector CT imaging of the abdomen and pelvis was performed using the standard protocol following bolus administration of intravenous contrast.  CONTRAST:  OMNIPAQUE IOHEXOL 350 MG/ML SOLN  COMPARISON:  CT scan 05/09/2014  FINDINGS: Lower chest: Moderate to large pericardial effusion, increased when compared to prior study. Small bilateral pleural effusions new or increased when compared to prior study. Visualized portions of the lung bases clear except for atelectasis left base.  Hepatobiliary: Status post cholecystectomy. No focal hepatic abnormalities.  Pancreas: Normal  Spleen: Normal  Adrenals/Urinary Tract: Adrenal glands are normal. Bilateral renal atrophy noted. Kidneys otherwise negative.  Stomach/Bowel: Negative  Vascular/Lymphatic: Inferior vena cava filter noted.  Reproductive: Appear to be absent  Other: Study limited by body habitus. Diffuse increased subcutaneous soft tissue attenuation suggesting anasarca. Moderate volume of ascites.  Musculoskeletal: Mild compression deformity of T11 appears mildly progressive when compared to prior study. No evidence of retropulsion.  IMPRESSION: Pericardial and pleural effusions with anasarca and ascites.  Mild interval progression of a  mild T11 compression deformity.   Electronically Signed   By: Esperanza Heir M.D.   On: 04/09/2015 22:03   US Renal  04/11/2015   CLINICAL DATA:  Acute renal failure.  EXAM: RENAL / URINARY TRACT ULTRASOUND COMPLETE  COMPARISON:  CT 04/09/2015  FINDINGS: Right Kidney:  Length: 16.4 cm. Echogenicity within normal limits. No mass or hydronephrosis visualized.  Left Kidney:  Length: 15.3 cm. Echogenicity within normal limits. No mass or hydronephrosis visualized.  Bladder:  Not visualized  Ascites most pronounced within the right upper and right lower quadrants.  IMPRESSION: No hydronephrosis.  Bladder is not well visualized.  Ascites.   Electronically Signed   By: Annia Belt M.D.   On: 04/11/2015 16:53   US Abdomen Limited  04/10/2015   CLINICAL DATA:  Ascites.  EXAM: LIMITED ABDOMINAL ULTRASOUND  COMPARISON:  CT scan of April 09, 2015.  FINDINGS: Sonographic evaluation demonstrates mild ascites in the right and left lower quadrants. However, due to the depth of these areas and the small fluid pockets present, paracentesis was not attempted.  IMPRESSION: Mild ascites is noted in the right and left lower quadrants.   Electronically Signed   By: Lupita Raider, M.D.   On: 04/10/2015 12:47   Dg Chest Portable 1 View  04/29/2015   CLINICAL DATA:  Left-sided chest pain today.  EXAM: PORTABLE CHEST -  1 VIEW  COMPARISON:  04/17/2015  FINDINGS: Enlargement of the cardiac silhouette flow most likely due to the previously demonstrated pericardial effusion. There is slight pulmonary vascular congestion. Increased density at the left lung base is most likely secondary to an increasing left effusion. Small right effusion.  IMPRESSION: Increasing moderate left effusion. Small right effusion. Slight pulmonary vascular congestion.   Electronically Signed   By: Francene Boyers M.D.   On: 04/29/2015 12:40   Dg Chest Portable 1 View  04/09/2015   CLINICAL DATA:  Chronic cough, with 40 lb weight gain. Initial encounter.   EXAM: PORTABLE CHEST - 1 VIEW  COMPARISON:  CT of the chest performed 04/05/2015, and chest radiograph performed 02/16/2015  FINDINGS: The lungs are well-aerated. A small left pleural effusion is noted. Vascular congestion is noted, with increased interstitial markings, concerning for mild pulmonary edema. There is no evidence of pneumothorax.  The cardiomediastinal silhouette is mildly enlarged. No acute osseous abnormalities are seen.  IMPRESSION: Small left pleural effusion noted. Vascular congestion and mild cardiomegaly, with increased interstitial markings, concerning for mild pulmonary edema.   Electronically Signed   By: Roanna Raider M.D.   On: 04/09/2015 23:28    Micro Results   See below  No results found for this or any previous visit (from the past 240 hour(s)).     Today   Subjective:   Sender Rueb today has no headache,no chest abdominal pain,no new weakness tingling or numbness, feels much better wants to go home today.   Objective:   Blood pressure 91/55, pulse 58, temperature 98 F (36.7 C), temperature source Oral, resp. rate 20, height 6' (1.829 m), weight 169.464 kg (373 lb 9.6 oz), SpO2 97 %.   Intake/Output Summary (Last 24 hours) at 05/03/15 0723 Last data filed at 05/03/15 0700  Gross per 24 hour  Intake  816.8 ml  Output    800 ml  Net   16.8 ml    Exam Awake Alert, Oriented x 3, No new F.N deficits, Normal affect [place].AT,PERRAL Supple Neck,No JVD, No cervical lymphadenopathy appriciated.  Symmetrical Chest wall movement, basilar rales RRR,No Gallops,Rubs or new Murmurs, No Parasternal Heave +ve B.Sounds, Abd Soft,morbidly obese with anasarca. Non tender, No organomegaly appriciated, No rebound -guarding or rigidity. No Cyanosis, Clubbing. 3+ edema noted. No new Rash or bruise  Data Review   CBC w Diff: Lab Results  Component Value Date   WBC 4.8 05/03/2015   WBC 5.8 10/29/2014   HGB 10.2* 05/03/2015   HGB 12.9* 10/29/2014   HCT 30.6*  05/03/2015   HCT 40.0 10/29/2014   PLT 216 05/03/2015   PLT 221 10/29/2014   LYMPHOPCT 19 05/03/2015   LYMPHOPCT 19.5 10/29/2014   MONOPCT 13 05/03/2015   MONOPCT 8.5 10/29/2014   EOSPCT 3 05/03/2015   EOSPCT 2.2 10/29/2014   BASOPCT 1 05/03/2015   BASOPCT 1.1 10/29/2014    CMP: Lab Results  Component Value Date   NA 135 05/03/2015   NA 136 08/21/2014   K 3.9 05/03/2015   K 3.7 08/21/2014   CL 95* 05/03/2015   CL 98 08/21/2014   CO2 32 05/03/2015   CO2 30 08/21/2014   BUN 37* 05/03/2015   BUN 25* 08/21/2014   CREATININE 1.56* 05/03/2015   CREATININE 1.73* 08/21/2014   PROT 6.4* 05/03/2015   PROT 7.2 08/21/2014   ALBUMIN 2.7* 05/03/2015   ALBUMIN 2.8* 08/21/2014   BILITOT 0.7 05/03/2015   BILITOT 1.2* 08/21/2014   ALKPHOS 205* 05/03/2015  ALKPHOS 330* 08/21/2014   AST 27 05/03/2015   AST 25 08/21/2014   ALT 10* 05/03/2015   ALT 13* 08/21/2014  .   Total Time in preparing paper work, data evaluation and todays exam - 45 minutes  Rianna Lukes D M.D on 05/03/2015 at 7:23 AM

## 2015-05-03 NOTE — Discharge Instructions (Signed)
Heart Failure Clinic appointment on May 16, 2015 at 9:00am with Clarisa Kindred, FNP. Please call [phone] to reschedule.  Heart Failure Heart failure means your heart has trouble pumping blood. This makes it hard for your body to work well. Heart failure is usually a long-term (chronic) condition. You must take good care of yourself and follow your doctor's treatment plan. HOME CARE  Take your heart medicine as told by your doctor.  Do not stop taking medicine unless your doctor tells you to.  Do not skip any dose of medicine.  Refill your medicines before they run out.  Take other medicines only as told by your doctor or pharmacist.  Stay active if told by your doctor. The elderly and people with severe heart failure should talk with a doctor about physical activity.  Eat heart-healthy foods. Choose foods that are without trans fat and are low in saturated fat, cholesterol, and salt (sodium). This includes fresh or frozen fruits and vegetables, fish, lean meats, fat-free or low-fat dairy foods, whole grains, and high-fiber foods. Lentils and dried peas and beans (legumes) are also good choices.  Limit salt if told by your doctor.  Cook in a healthy way. Roast, grill, broil, bake, poach, steam, or stir-fry foods.  Limit fluids as told by your doctor.  Weigh yourself every morning. Do this after you pee (urinate) and before you eat breakfast. Write down your weight to give to your doctor.  Take your blood pressure and write it down if your doctor tells you to.  Ask your doctor how to check your pulse. Check your pulse as told.  Lose weight if told by your doctor.  Stop smoking or chewing tobacco. Do not use gum or patches that help you quit without your doctor's approval.  Schedule and go to doctor visits as told.  Nonpregnant women should have no more than 1 drink a day. Men should have no more than 2 drinks a day. Talk to your doctor about drinking alcohol.  Stop illegal  drug use.  Stay current with shots (immunizations).  Manage your health conditions as told by your doctor.  Learn to manage your stress.  Rest when you are tired.  If it is really hot outside:  Avoid intense activities.  Use air conditioning or fans, or get in a cooler place.  Avoid caffeine and alcohol.  Wear loose-fitting, lightweight, and light-colored clothing.  If it is really cold outside:  Avoid intense activities.  Layer your clothing.  Wear mittens or gloves, a hat, and a scarf when going outside.  Avoid alcohol.  Learn about heart failure and get support as needed.  Get help to maintain or improve your quality of life and your ability to care for yourself as needed. GET HELP IF:   You gain 03 lb/1.4 kg or more in 1 day or 05 lb/2.3 kg in a week.  You are more short of breath than usual.  You cannot do your normal activities.  You tire easily.  You cough more than normal, especially with activity.  You have any or more puffiness (swelling) in areas such as your hands, feet, ankles, or belly (abdomen).  You cannot sleep because it is hard to breathe.  You feel like your heart is beating fast (palpitations).  You get dizzy or light-headed when you stand up. GET HELP RIGHT AWAY IF:   You have trouble breathing.  There is a change in mental status, such as becoming less alert or not being able  to focus.  You have chest pain or discomfort.  You faint. MAKE SURE YOU:   Understand these instructions.  Will watch your condition.  Will get help right away if you are not doing well or get worse. Document Released: 06/16/2008 Document Revised: 01/22/2014 Document Reviewed: 10/24/2012 [facility] Patient Information 2015 [place], [place]. This information is not intended to replace advice given to you by your health care provider. Make sure you discuss any questions you have with your health care provider.

## 2015-05-06 ENCOUNTER — Encounter: Payer: Self-pay | Admitting: *Deleted

## 2015-05-06 ENCOUNTER — Other Ambulatory Visit: Payer: Self-pay | Admitting: *Deleted

## 2015-05-06 NOTE — Patient Outreach (Signed)
Transition of care call (week 1- discharged 8/12):  Spoke with spouse who reports  reason for pt's  recent hospitalization is he has stomach problems, bloated all the time, might have to f/u with GI.   Spouse states pt still has swelling (abdomine, lower extremities)- coming from filter (IVC) placed,they  need to take out if it causes swelling.  Spouse reports pt has been having sob walking- has  back pain, they need to listen to pt.   RN CM discussed with spouse pt weighing daily, call MD for weight gain of  3 lbs in a day, 5 lbs. In a week to which spouse voiced understanding.   Spouse states HH RN came out.    Discussed with spouse plan to f/u pt for transition of care, provide weekly phone calls 31 days post discharge.  However, informed spouse this RN CM will not be available for the next 2 weeks, therefore, coworker RN will be doing the calls.   Spouse requested coworker RN call when she is home- after 3:30 pm to which this RN CM will relay.    Shayne Alken.   Pierzchala RN CCM [facility] Care Management  [phone]

## 2015-05-06 NOTE — Patient Outreach (Signed)
[facility]) Care Management  05/06/2015  Tyler [name] Jun 16, 1956 [id number]   Documentation:  Case management program updated- transition of care program started today 05/06/15.      Tyler [name].   Tyler Goyne RN CCM [facility] Care Management  [phone]

## 2015-05-07 ENCOUNTER — Other Ambulatory Visit: Payer: Self-pay | Admitting: *Deleted

## 2015-05-07 NOTE — Patient Outreach (Signed)
Spoke with  spouse Jasmine December  to see the time of upcoming MD appointment with Dr. Judithann Sheen to which she said 11am- 8/22. Discussed with spouse  having [facility] NP who is covering for this RN CM  meet pt at MD office visit -provide support/help answer any questions to which spouse declined.     Shayne Alken.   Pierzchala RN CCM [facility] Care Management  [phone]

## 2015-05-14 ENCOUNTER — Other Ambulatory Visit: Payer: Self-pay | Admitting: *Deleted

## 2015-05-14 NOTE — Patient Outreach (Signed)
Tyler [name] GNP-BC [facility] Care Manager [phone]  

## 2015-05-14 NOTE — Patient Outreach (Addendum)
Transition of care call #2, made at 4:20 pm. Mr. Hettich answered the phone and said his wife was not there. I asked if I could ask him a few questions and he agreed. He stated, "I'm feeling as well as expected." He told me his wt today was 387. He could not tell me if this was more or less than since he came home from the hospital. He started getting a little bit aggrevated and asked if I could call back later and talk to his wife. I agreed to this.  Transition of care call to wife at 7:00 pm: I was able to speak to pt's wife, Jasmine December. She reports that pt did attend his MD appt on Monday. He had a chest X ray and labs drawn. He was told to stop the mirtazapine as it was thought this was causing additional wt gain. He does weigh daily and Mrs. Smead says he has lost some wt.  He has all his meds and takes them as ordered according to Mrs. Lillibridge. They have requested mail order meds from [facility]. All other meds she gets at [facility]  He is going to see his nephrologist on Thursday this week. His wife will take him. She says Mr. Loughmiller told the Home Health agency nurses not to come as he didn't feel this was necessary. I did try to explain to him earlier that the purpose to is make sure he is not having any problems and to detect early signs of health problems that may send him back to the hospital. I asked if they would allow Rose to see him again and there is some resistance from the patient. I closed the call by telling them Okey Dupre will call next week to follow up after 3:30 pm and talk to Mrs. Shoe as this is their choice. I gave them my name and number in case there are any questions or problems that come up before Rose comes back from vacation.  Almetta Lovely [facility] Care Manager [phone]

## 2015-05-16 ENCOUNTER — Ambulatory Visit: Payer: Commercial Managed Care - HMO | Admitting: Family

## 2015-05-16 ENCOUNTER — Telehealth: Payer: Self-pay | Admitting: Family

## 2015-05-16 NOTE — Telephone Encounter (Signed)
Patient had an initial appointment scheduled today (05/16/15) at [facility] that he cancelled. He said that he knew everything he needed to know about heart failure and because of that, he didn't see the need for his wife to take off work to bring him. Was not interested in rescheduling at this time.

## 2015-05-21 ENCOUNTER — Other Ambulatory Visit: Payer: Self-pay | Admitting: *Deleted

## 2015-05-21 NOTE — Patient Outreach (Signed)
RNCM was able to contact pt's wife for [facility] as covering RNCM for Jodi Mourning RN, CCM. Pt was sitting nearby and was part of the conversation. Wife made RNCM aware of pt's visit to the nephrologists. Wife reports there were no changes to medications, but lab work was done. Spouse stated patient did has not had any falls this week and was doing fairly well. Spouse stated she was having a hard time getting prescriptions called in to [facility] mail order instead of [facility]. She stated certain ones are cheaper at [facility] and others are cheaper at [facility]. Pt and wife stated their medications are very expensive each month. RNCM mentioned "extra help" or LIS and spouse was not sure if they had this service or not. RNCM asked spouse if it was something she was interested in and she was. Spouse stated pt had not been SOB and has no increased swelling. Spouse voiced she was aware of how to get in touch with [facility] if needed. RNCM made pt and spouse aware that O[facility] Dupre would be back and would be contacting them next week for transition of care.   Plan: RNCM will ask [facility] to assist pt in signing up for LIS RNCM will report to Jodi Mourning RN, CCM pt status.   Costella Hatcher RN, BSN  [facility] Care Management [phone])

## 2015-05-27 ENCOUNTER — Inpatient Hospital Stay
Admission: EM | Admit: 2015-05-27 | Discharge: 2015-05-30 | DRG: 292 | Disposition: A | Discharge status: Skilled Nursing Facility | Payer: Commercial Managed Care - HMO | Attending: Internal Medicine | Admitting: Internal Medicine

## 2015-05-27 ENCOUNTER — Encounter: Payer: Self-pay | Admitting: Emergency Medicine

## 2015-05-27 ENCOUNTER — Emergency Department: Payer: Commercial Managed Care - HMO

## 2015-05-27 DIAGNOSIS — E782 Mixed hyperlipidemia: Secondary | ICD-10-CM | POA: Diagnosis present

## 2015-05-27 DIAGNOSIS — J449 Chronic obstructive pulmonary disease, unspecified: Secondary | ICD-10-CM | POA: Diagnosis present

## 2015-05-27 DIAGNOSIS — Z794 Long term (current) use of insulin: Secondary | ICD-10-CM

## 2015-05-27 DIAGNOSIS — G8929 Other chronic pain: Secondary | ICD-10-CM | POA: Diagnosis present

## 2015-05-27 DIAGNOSIS — I482 Chronic atrial fibrillation: Secondary | ICD-10-CM | POA: Diagnosis present

## 2015-05-27 DIAGNOSIS — I272 Other secondary pulmonary hypertension: Secondary | ICD-10-CM | POA: Diagnosis present

## 2015-05-27 DIAGNOSIS — E119 Type 2 diabetes mellitus without complications: Secondary | ICD-10-CM

## 2015-05-27 DIAGNOSIS — E871 Hypo-osmolality and hyponatremia: Secondary | ICD-10-CM | POA: Diagnosis present

## 2015-05-27 DIAGNOSIS — F17210 Nicotine dependence, cigarettes, uncomplicated: Secondary | ICD-10-CM | POA: Diagnosis present

## 2015-05-27 DIAGNOSIS — D6859 Other primary thrombophilia: Secondary | ICD-10-CM | POA: Diagnosis present

## 2015-05-27 DIAGNOSIS — M545 Low back pain: Secondary | ICD-10-CM | POA: Diagnosis present

## 2015-05-27 DIAGNOSIS — E1140 Type 2 diabetes mellitus with diabetic neuropathy, unspecified: Secondary | ICD-10-CM | POA: Diagnosis present

## 2015-05-27 DIAGNOSIS — R0902 Hypoxemia: Secondary | ICD-10-CM | POA: Diagnosis present

## 2015-05-27 DIAGNOSIS — N183 Chronic kidney disease, stage 3 (moderate): Secondary | ICD-10-CM | POA: Diagnosis present

## 2015-05-27 DIAGNOSIS — R040 Epistaxis: Secondary | ICD-10-CM | POA: Diagnosis not present

## 2015-05-27 DIAGNOSIS — I4891 Unspecified atrial fibrillation: Secondary | ICD-10-CM | POA: Diagnosis present

## 2015-05-27 DIAGNOSIS — Z86711 Personal history of pulmonary embolism: Secondary | ICD-10-CM | POA: Diagnosis not present

## 2015-05-27 DIAGNOSIS — I129 Hypertensive chronic kidney disease with stage 1 through stage 4 chronic kidney disease, or unspecified chronic kidney disease: Secondary | ICD-10-CM | POA: Diagnosis present

## 2015-05-27 DIAGNOSIS — E11319 Type 2 diabetes mellitus with unspecified diabetic retinopathy without macular edema: Secondary | ICD-10-CM | POA: Diagnosis present

## 2015-05-27 DIAGNOSIS — R601 Generalized edema: Secondary | ICD-10-CM

## 2015-05-27 DIAGNOSIS — Z6841 Body Mass Index (BMI) 40.0 and over, adult: Secondary | ICD-10-CM

## 2015-05-27 DIAGNOSIS — I5033 Acute on chronic diastolic (congestive) heart failure: Secondary | ICD-10-CM | POA: Diagnosis present

## 2015-05-27 DIAGNOSIS — Z86718 Personal history of other venous thrombosis and embolism: Secondary | ICD-10-CM | POA: Diagnosis not present

## 2015-05-27 DIAGNOSIS — Z9981 Dependence on supplemental oxygen: Secondary | ICD-10-CM

## 2015-05-27 DIAGNOSIS — E1122 Type 2 diabetes mellitus with diabetic chronic kidney disease: Secondary | ICD-10-CM | POA: Diagnosis present

## 2015-05-27 DIAGNOSIS — Z9114 Patient's other noncompliance with medication regimen: Secondary | ICD-10-CM | POA: Diagnosis present

## 2015-05-27 DIAGNOSIS — I959 Hypotension, unspecified: Secondary | ICD-10-CM | POA: Diagnosis present

## 2015-05-27 DIAGNOSIS — E1121 Type 2 diabetes mellitus with diabetic nephropathy: Secondary | ICD-10-CM | POA: Diagnosis present

## 2015-05-27 DIAGNOSIS — Z888 Allergy status to other drugs, medicaments and biological substances status: Secondary | ICD-10-CM

## 2015-05-27 DIAGNOSIS — Z7901 Long term (current) use of anticoagulants: Secondary | ICD-10-CM | POA: Diagnosis not present

## 2015-05-27 DIAGNOSIS — E039 Hypothyroidism, unspecified: Secondary | ICD-10-CM | POA: Diagnosis present

## 2015-05-27 DIAGNOSIS — J45909 Unspecified asthma, uncomplicated: Secondary | ICD-10-CM | POA: Diagnosis present

## 2015-05-27 DIAGNOSIS — R188 Other ascites: Secondary | ICD-10-CM | POA: Diagnosis present

## 2015-05-27 DIAGNOSIS — Z79899 Other long term (current) drug therapy: Secondary | ICD-10-CM | POA: Diagnosis not present

## 2015-05-27 DIAGNOSIS — F331 Major depressive disorder, recurrent, moderate: Secondary | ICD-10-CM | POA: Diagnosis present

## 2015-05-27 DIAGNOSIS — M109 Gout, unspecified: Secondary | ICD-10-CM | POA: Diagnosis present

## 2015-05-27 DIAGNOSIS — E785 Hyperlipidemia, unspecified: Secondary | ICD-10-CM | POA: Diagnosis present

## 2015-05-27 DIAGNOSIS — G4733 Obstructive sleep apnea (adult) (pediatric): Secondary | ICD-10-CM | POA: Diagnosis present

## 2015-05-27 DIAGNOSIS — I509 Heart failure, unspecified: Secondary | ICD-10-CM | POA: Diagnosis present

## 2015-05-27 LAB — HEPATIC FUNCTION PANEL
ALBUMIN: 2.7 g/dL — AB (ref 3.5–5.0)
ALBUMIN: 2.8 g/dL — AB (ref 3.5–5.0)
ALT: 9 U/L — ABNORMAL LOW (ref 17–63)
ALT: 9 U/L — ABNORMAL LOW (ref 17–63)
AST: 28 U/L (ref 15–41)
AST: 27 U/L (ref 15–41)
Alkaline Phosphatase: 175 U/L — ABNORMAL HIGH (ref 38–126)
Alkaline Phosphatase: 198 U/L — ABNORMAL HIGH (ref 38–126)
BILIRUBIN TOTAL: 1.1 mg/dL (ref 0.3–1.2)
Bilirubin, Direct: 0.3 mg/dL (ref 0.1–0.5)
Bilirubin, Direct: 0.4 mg/dL (ref 0.1–0.5)
Indirect Bilirubin: 0.5 mg/dL (ref 0.3–0.9)
Indirect Bilirubin: 0.7 mg/dL (ref 0.3–0.9)
Total Bilirubin: 0.8 mg/dL (ref 0.3–1.2)
Total Protein: 6.5 g/dL (ref 6.5–8.1)
Total Protein: 6.8 g/dL (ref 6.5–8.1)

## 2015-05-27 LAB — CBC
HEMATOCRIT: 30.7 % — AB (ref 40.0–52.0)
HEMOGLOBIN: 9.9 g/dL — AB (ref 13.0–18.0)
MCH: 25.6 pg — ABNORMAL LOW (ref 26.0–34.0)
MCHC: 32.2 g/dL (ref 32.0–36.0)
MCV: 79.3 fL — AB (ref 80.0–100.0)
Platelets: 236 10*3/uL (ref 150–440)
RBC: 3.87 MIL/uL — ABNORMAL LOW (ref 4.40–5.90)
RDW: 16.7 % — ABNORMAL HIGH (ref 11.5–14.5)
WBC: 6 10*3/uL (ref 3.8–10.6)

## 2015-05-27 LAB — LACTIC ACID, PLASMA: Lactic Acid, Venous: 1.1 mmol/L (ref 0.5–2.0)

## 2015-05-27 LAB — BASIC METABOLIC PANEL
ANION GAP: 12 (ref 5–15)
BUN: 50 mg/dL — ABNORMAL HIGH (ref 6–20)
CHLORIDE: 85 mmol/L — AB (ref 101–111)
CO2: 27 mmol/L (ref 22–32)
Calcium: 8.2 mg/dL — ABNORMAL LOW (ref 8.9–10.3)
Creatinine, Ser: 1.76 mg/dL — ABNORMAL HIGH (ref 0.61–1.24)
GFR calc Af Amer: 47 mL/min — ABNORMAL LOW (ref >60)
GFR, EST NON AFRICAN AMERICAN: 41 mL/min — AB (ref >60)
GLUCOSE: 189 mg/dL — AB (ref 65–99)
POTASSIUM: 4.4 mmol/L (ref 3.5–5.1)
Sodium: 124 mmol/L — ABNORMAL LOW (ref 135–145)

## 2015-05-27 LAB — TROPONIN I: Troponin I: <0.03 ng/mL (ref <0.031)

## 2015-05-27 LAB — BRAIN NATRIURETIC PEPTIDE: B NATRIURETIC PEPTIDE 5: 443 pg/mL — AB (ref 0.0–100.0)

## 2015-05-27 MED ORDER — SALMETEROL XINAFOATE 50 MCG/DOSE IN AEPB
1.0000 | INHALATION_SPRAY | Freq: Two times a day (BID) | RESPIRATORY_TRACT | Status: DC
  Administered 2015-05-28 – 2015-05-30 (×5): 1 via RESPIRATORY_TRACT
  Filled 2015-05-27: qty 0

## 2015-05-27 MED ORDER — INSULIN ASPART 100 UNIT/ML ~~LOC~~ SOLN
0.0000 [IU] | Freq: Every day | SUBCUTANEOUS | Status: DC
  Administered 2015-05-28: 2 [IU] via SUBCUTANEOUS
  Filled 2015-05-27 (×2): qty 2

## 2015-05-27 MED ORDER — ONDANSETRON HCL 4 MG/2ML IJ SOLN: 4.0000 mg | Freq: Four times a day (QID) | INTRAMUSCULAR | Status: DC | PRN

## 2015-05-27 MED ORDER — ACETAMINOPHEN 325 MG PO TABS
650.0000 mg | ORAL_TABLET | Freq: Four times a day (QID) | ORAL | Status: DC | PRN
  Administered 2015-05-28 – 2015-05-30 (×3): 650 mg via ORAL
  Filled 2015-05-27 (×3): qty 2

## 2015-05-27 MED ORDER — ONDANSETRON HCL 4 MG PO TABS: 4.0000 mg | ORAL_TABLET | Freq: Four times a day (QID) | ORAL | Status: DC | PRN

## 2015-05-27 MED ORDER — SPIRONOLACTONE 25 MG PO TABS
50.0000 mg | ORAL_TABLET | Freq: Every day | ORAL | Status: DC
  Administered 2015-05-28 – 2015-05-30 (×3): 50 mg via ORAL
  Filled 2015-05-27 (×3): qty 2

## 2015-05-27 MED ORDER — FUROSEMIDE 10 MG/ML IJ SOLN
40.0000 mg | Freq: Three times a day (TID) | INTRAMUSCULAR | Status: DC
  Administered 2015-05-28 – 2015-05-29 (×4): 40 mg via INTRAVENOUS
  Filled 2015-05-27 (×4): qty 4

## 2015-05-27 MED ORDER — SODIUM CHLORIDE 1 G PO TABS
1.0000 g | ORAL_TABLET | Freq: Every day | ORAL | Status: DC
  Administered 2015-05-28 – 2015-05-29 (×2): 1 g via ORAL
  Filled 2015-05-27 (×2): qty 1

## 2015-05-27 MED ORDER — INSULIN ASPART 100 UNIT/ML ~~LOC~~ SOLN
0.0000 [IU] | Freq: Three times a day (TID) | SUBCUTANEOUS | Status: DC
  Administered 2015-05-28 – 2015-05-30 (×7): 2 [IU] via SUBCUTANEOUS
  Administered 2015-05-30: 3 [IU] via SUBCUTANEOUS
  Filled 2015-05-27 (×2): qty 2
  Filled 2015-05-27: qty 3
  Filled 2015-05-27 (×5): qty 2

## 2015-05-27 MED ORDER — PRAVASTATIN SODIUM 20 MG PO TABS
40.0000 mg | ORAL_TABLET | Freq: Every day | ORAL | Status: DC
  Administered 2015-05-28 – 2015-05-29 (×3): 40 mg via ORAL
  Filled 2015-05-27 (×3): qty 2

## 2015-05-27 MED ORDER — TAMSULOSIN HCL 0.4 MG PO CAPS
0.4000 mg | ORAL_CAPSULE | Freq: Every day | ORAL | Status: DC
  Administered 2015-05-28 – 2015-05-30 (×3): 0.4 mg via ORAL
  Filled 2015-05-27 (×3): qty 1

## 2015-05-27 MED ORDER — PAROXETINE HCL 20 MG PO TABS
20.0000 mg | ORAL_TABLET | Freq: Every day | ORAL | Status: DC
  Administered 2015-05-28 – 2015-05-30 (×3): 20 mg via ORAL
  Filled 2015-05-27 (×6): qty 1

## 2015-05-27 MED ORDER — ACETAMINOPHEN 650 MG RE SUPP: 650.0000 mg | Freq: Four times a day (QID) | RECTAL | Status: DC | PRN

## 2015-05-27 MED ORDER — LEVOTHYROXINE SODIUM 100 MCG PO TABS: 100.0000 ug | ORAL_TABLET | Freq: Every day | ORAL | Status: DC

## 2015-05-27 NOTE — ED Provider Notes (Signed)
[facility] Emergency Department Provider Note  ____________________________________________  Time seen: Approximately 8:13 PM  I have reviewed the triage vital signs and the nursing notes.   HISTORY  Chief Complaint Weakness    HPI Tyler [name] is a 59 y.o. male patient reports he's been feeling weak for approximately a week.  worse on Saturday. Last night he couldn't lay down at all. He said he was hurting all over. He said he had a running feeling in his chest as well he thought that was from A. Fib. Patient also appears to be slightly confused by the instructions his 3 doctors are giving him as far as his fluid medication.  Past Medical History  Diagnosis Date  . Cataract   . CHF (congestive heart failure)   . COPD (chronic obstructive pulmonary disease)   . Diabetes mellitus without complication   . Sleep apnea   . Oxygen deficiency   . Thyroid disease   . Gout   . Hypothyroidism   . CKD (chronic kidney disease) stage 3, GFR 30-59 ml/min   . Asthma   . Hypertension     Patient Active Problem List   Diagnosis Date Noted  . Acute on chronic diastolic congestive heart failure   . Depression, major, recurrent, moderate 04/12/2015  . CHF (congestive heart failure) 04/10/2015  . Anasarca 04/09/2015  . COPD, moderate 03/12/2015  . Tobacco abuse 03/12/2015  . Acute CHF 02/16/2015  . Hyponatremia 02/16/2015  . A-fib 02/16/2015  . Chronic respiratory failure 02/16/2015    Past Surgical History  Procedure Laterality Date  . Cholecystectomy      2002  . Eye surgery      right cataract removed 3/21  . Vena cava filter placement      Current Outpatient Rx  Name  Route  Sig  Dispense  Refill  . acetaminophen (TYLENOL) 500 MG tablet   Oral   Take 1,000 mg by mouth every 6 (six) hours as needed for mild pain or headache.         . colchicine 0.6 MG tablet   Oral   Take 0.6 mg by mouth 2 (two) times daily as needed (for wrist  pain).         . cyanocobalamin (,VITAMIN B-12,) 1000 MCG/ML injection   Intramuscular   Inject 1,000 mcg into the muscle every 30 (thirty) days.         . cyanocobalamin 2000 MCG tablet   Oral   Take 2,000 mcg by mouth daily.         Marland Kitchen gabapentin (NEURONTIN) 100 MG capsule   Oral   Take 100 mg by mouth 2 (two) times daily.         . insulin NPH Human (HUMULIN N,NOVOLIN N) 100 UNIT/ML injection   Subcutaneous   Inject 90 Units into the skin 2 (two) times daily before a meal.          . insulin regular (NOVOLIN R,HUMULIN R) 100 units/mL injection   Subcutaneous   Inject 10 Units into the skin 3 (three) times daily as needed for high blood sugar. Pt uses before meals as needed per sliding scale.         Marland Kitchen ipratropium-albuterol (DUONEB) 0.5-2.5 (3) MG/3ML SOLN   Nebulization   Take 3 mLs by nebulization every 4 (four) hours as needed. Patient taking differently: Take 3 mLs by nebulization every 4 (four) hours as needed (for shortness of breath).    360 mL  5   . lactulose (CHRONULAC) 10 GM/15ML solution   Oral   Take 30 g by mouth 2 (two) times daily as needed for mild constipation.          Marland Kitchen levothyroxine (SYNTHROID, LEVOTHROID) 100 MCG tablet   Oral   Take 100 mcg by mouth daily before breakfast.         . metolazone (ZAROXOLYN) 5 MG tablet   Oral   Take 5 mg by mouth daily.         Marland Kitchen nystatin (MYCOSTATIN/NYSTOP) 100000 UNIT/GM POWD   Topical   Apply 1 g topically 2 (two) times daily as needed (for rash).          . ondansetron (ZOFRAN) 4 MG tablet   Oral   Take 1 tablet (4 mg total) by mouth every 6 (six) hours as needed for nausea.   20 tablet   0   . PARoxetine (PAXIL) 20 MG tablet   Oral   Take 20 mg by mouth daily.         . polyethylene glycol (MIRALAX / GLYCOLAX) packet   Oral   Take 17 g by mouth daily as needed for moderate constipation.          . potassium chloride (K-DUR) 10 MEQ tablet   Oral   Take 20 mEq by mouth  daily.         . pravastatin (PRAVACHOL) 40 MG tablet   Oral   Take 40 mg by mouth at bedtime.         . salmeterol (SEREVENT) 50 MCG/DOSE diskus inhaler   Inhalation   Inhale 1 puff into the lungs every 12 (twelve) hours.   1 Inhaler   12   . spironolactone (ALDACTONE) 25 MG tablet   Oral   Take 50 mg by mouth daily.          . tamsulosin (FLOMAX) 0.4 MG CAPS capsule   Oral   Take 1 capsule (0.4 mg total) by mouth daily.   30 capsule   5   . torsemide (DEMADEX) 100 MG tablet   Oral   Take 1 tablet (100 mg total) by mouth daily. Patient taking differently: Take 50 mg by mouth daily.    30 tablet   5   . warfarin (COUMADIN) 10 MG tablet   Oral   Take 5-10 mg by mouth every evening. Pt takes 5mg  on Tuesday, Wednesday, Thursday, Saturday, and Sunday.   Pt takes 10mg  on Monday and Friday.         . metolazone (ZAROXOLYN) 2.5 MG tablet   Oral   Take 1 tablet (2.5 mg total) by mouth daily. Patient not taking: Reported on 05/27/2015   30 tablet   5   . mirtazapine (REMERON) 30 MG tablet   Oral   Take 1 tablet (30 mg total) by mouth at bedtime. Patient not taking: Reported on 05/27/2015   30 tablet   5   . potassium chloride SA (K-DUR,KLOR-CON) 20 MEQ tablet   Oral   Take 1 tablet (20 mEq total) by mouth 4 (four) times daily. Patient not taking: Reported on 05/27/2015   120 tablet   5     Allergies Enalapril; Atorvastatin; and Lovastatin  Family History  Problem Relation Age of Onset  . Diabetes Sister   . Diabetes Brother   . Deep vein thrombosis Mother   . CVA Father     Social History Social History  Substance Use Topics  .  Smoking status: Current Every Day Smoker -- 2.00 packs/day for 30 years    Types: Cigarettes, Cigars  . Smokeless tobacco: Never Used     Comment: cigar 3-4 yrs 1-3 per day/former Cigarettes  . Alcohol Use: No    Review of Systems Constitutional: No fever/chills Eyes: No visual changes. ENT: No sore  throat. Cardiovascular: Denies chest pain. Respiratory: Denies shortness of breath. Gastrointestinal: No abdominal pain.  No nausea, no vomiting.  No diarrhea.  No constipation. Genitourinary: Negative for dysuria. Musculoskeletal: Negative for back pain. Skin: Negative for rash. Neurological: Negative for headaches, focal weakness or numbness.  10-point ROS otherwise negative.  ____________________________________________   PHYSICAL EXAM:  VITAL SIGNS: ED Triage Vitals  Enc Vitals Group     BP 05/27/15 1632 123/61 mmHg     Pulse Rate 05/27/15 1632 57     Resp 05/27/15 1632 18     Temp 05/27/15 1632 97.9 F (36.6 C)     Temp Source 05/27/15 1632 Oral     SpO2 05/27/15 1632 92 %     Weight 05/27/15 1632 394 lb (178.717 kg)     Height 05/27/15 1632 6' (1.829 m)     Head Cir --      Peak Flow --      Pain Score --      Pain Loc --      Pain Edu? --      Excl. in GC? --     Constitutional: Alert and oriented. Well appearing and in no acute distress.  Eyes: Conjunctivae are normal. PERRL. EOMI. Head: Atraumatic. Nose: No congestion/rhinnorhea. Mouth/Throat: Mucous membranes are moist.  Oropharynx non-erythematous. Neck: No stridor. Cardiovascular: Normal rate, regular rhythm. Grossly normal heart sounds.  Respiratory: Normal respiratory effort.  No retractions. Lungs crackles in bases Gastrointestinal: Abdomen part of the abdomen is soft part of the abdomen is firm there is edema in the lower abdominal wall. No apparent tenderness to palpation no apparent organomegaly although the exam is very difficult as his abdomen is markedly protuberant. Musculoskeletal: Patient had massive edema of both legs. Neurologic:  Normal speech and language. No gross focal neurologic deficits are appreciated. Skin:  Skin is warm, dry and intact. No rash noted.   ____________________________________________   LABS (all labs ordered are listed, but only abnormal results are  displayed)  Labs Reviewed  BASIC METABOLIC PANEL - Abnormal; Notable for the following:    Sodium 124 (*)    Chloride 85 (*)    Glucose, Bld 189 (*)    BUN 50 (*)    Creatinine, Ser 1.76 (*)    Calcium 8.2 (*)    GFR calc non Af Amer 41 (*)    GFR calc Af Amer 47 (*)    All other components within normal limits  CBC - Abnormal; Notable for the following:    RBC 3.87 (*)    Hemoglobin 9.9 (*)    HCT 30.7 (*)    MCV 79.3 (*)    MCH 25.6 (*)    RDW 16.7 (*)    All other components within normal limits  BRAIN NATRIURETIC PEPTIDE - Abnormal; Notable for the following:    B Natriuretic Peptide 443.0 (*)    All other components within normal limits  HEPATIC FUNCTION PANEL - Abnormal; Notable for the following:    Albumin 2.7 (*)    ALT 9 (*)    Alkaline Phosphatase 175 (*)    All other components within normal limits  TROPONIN I  LACTIC ACID, PLASMA  LACTIC ACID, PLASMA  HEPATIC FUNCTION PANEL   ____________________________________________  EKG  EKG appears to be sinus rhythm with first-degree AV block not sure if there are some dropped beats present or not. The computer is reading A. fib but I do not believe he is having A. fib at this point ____________________________________________  RADIOLOGY  chest x-ray is read as stable congestive heart failure ____________________________________________   PROCEDURES    ____________________________________________   INITIAL IMPRESSION / ASSESSMENT AND PLAN / ED COURSE  Pertinent labs & imaging results that were available during my care of the patient were reviewed by me and considered in my medical decision making (see chart for details).   ____________________________________________   FINAL CLINICAL IMPRESSION(S) / ED DIAGNOSES  Final diagnoses:  Anasarca  Hyponatremia      Arnaldo Natal, MD 05/27/15 2114

## 2015-05-27 NOTE — ED Notes (Signed)
Pt states he woke up this am feeling weak. States he has an intermittant "running feeling" in his chest. Denies pain. Pt is on home O2. Sats 92% on 2.5L.

## 2015-05-27 NOTE — H&P (Signed)
[facility] - [place] at [facility]   PATIENT NAME: Tyler [name]    MR#:  [id number]  DATE OF BIRTH:  08-Nov-1955  DATE OF ADMISSION:  05/27/2015  PRIMARY CARE PHYSICIAN: Marguarite Arbour, MD   REQUESTING/REFERRING PHYSICIAN: Darnelle Catalan, MD  CHIEF COMPLAINT:   Chief Complaint  Patient presents with  . Weakness    HISTORY OF PRESENT ILLNESS:  Tyler [name]  is a 59 y.o. male who presents with progressive weakness, as well as increasing weight and increased lower extremity abdominal swelling. Patient states that he has congestive heart failure, and then he has significant orthopnea at baseline. He notes that for the past several days he's been getting progressively weaker. The weakness is not localized or focal, but seems to be more generalized. However he has noted that is becoming increasingly more difficult to stand and walk. On evaluation in the ED he was found to have a BNP elevated at 443, as well as a sodium of 124. Patient also states that he has had some nonspecific GI upset for the past several days, with loose stools that he states are darker than usual. He denies frank diarrhea, and denies any nausea or vomiting. He also states that he has been having very poor by mouth intake, mostly just eating crackers and toast for the past several days. Hospitalists were called for admission for heart failure exacerbation and hyponatremia.  PAST MEDICAL HISTORY:   Past Medical History  Diagnosis Date  . Cataract   . CHF (congestive heart failure)   . COPD (chronic obstructive pulmonary disease)   . Diabetes mellitus without complication   . Sleep apnea   . Oxygen deficiency   . Thyroid disease   . Gout   . Hypothyroidism   . CKD (chronic kidney disease) stage 3, GFR 30-59 ml/min   . Asthma   . Hypertension     PAST SURGICAL HISTORY:   Past Surgical History  Procedure Laterality Date  . Cholecystectomy      2002  . Eye surgery      right  cataract removed 3/21  . Vena cava filter placement      SOCIAL HISTORY:   Social History  Substance Use Topics  . Smoking status: Current Every Day Smoker -- 2.00 packs/day for 30 years    Types: Cigarettes, Cigars  . Smokeless tobacco: Never Used     Comment: cigar 3-4 yrs 1-3 per day/former Cigarettes  . Alcohol Use: No    FAMILY HISTORY:   Family History  Problem Relation Age of Onset  . Diabetes Sister   . Diabetes Brother   . Deep vein thrombosis Mother   . CVA Father     DRUG ALLERGIES:   Allergies  Allergen Reactions  . Enalapril Cough  . Atorvastatin Palpitations  . Lovastatin Palpitations    MEDICATIONS AT HOME:   Prior to Admission medications   Medication Sig Start Date End Date Taking? Authorizing Provider  acetaminophen (TYLENOL) 500 MG tablet Take 1,000 mg by mouth every 6 (six) hours as needed for mild pain or headache.   Yes Historical Provider, MD  colchicine 0.6 MG tablet Take 0.6 mg by mouth 2 (two) times daily as needed (for wrist pain).   Yes Historical Provider, MD  cyanocobalamin (,VITAMIN B-12,) 1000 MCG/ML injection Inject 1,000 mcg into the muscle every 30 (thirty) days.   Yes Historical Provider, MD  cyanocobalamin 2000 MCG tablet Take 2,000 mcg by mouth daily.   Yes Historical Provider, MD  gabapentin (NEURONTIN) 100 MG capsule Take 100 mg by mouth 2 (two) times daily.   Yes Historical Provider, MD  insulin NPH Human (HUMULIN N,NOVOLIN N) 100 UNIT/ML injection Inject 90 Units into the skin 2 (two) times daily before a meal.    Yes Historical Provider, MD  insulin regular (NOVOLIN R,HUMULIN R) 100 units/mL injection Inject 10 Units into the skin 3 (three) times daily as needed for high blood sugar. Pt uses before meals as needed per sliding scale.   Yes Historical Provider, MD  ipratropium-albuterol (DUONEB) 0.5-2.5 (3) MG/3ML SOLN Take 3 mLs by nebulization every 4 (four) hours as needed. Patient taking differently: Take 3 mLs by nebulization  every 4 (four) hours as needed (for shortness of breath).  04/19/15  Yes Marguarite Arbour, MD  lactulose (CHRONULAC) 10 GM/15ML solution Take 30 g by mouth 2 (two) times daily as needed for mild constipation.    Yes Historical Provider, MD  levothyroxine (SYNTHROID, LEVOTHROID) 100 MCG tablet Take 100 mcg by mouth daily before breakfast.   Yes Historical Provider, MD  metolazone (ZAROXOLYN) 5 MG tablet Take 5 mg by mouth daily.   Yes Historical Provider, MD  nystatin (MYCOSTATIN/NYSTOP) 100000 UNIT/GM POWD Apply 1 g topically 2 (two) times daily as needed (for rash).    Yes Historical Provider, MD  ondansetron (ZOFRAN) 4 MG tablet Take 1 tablet (4 mg total) by mouth every 6 (six) hours as needed for nausea. 04/19/15  Yes Marguarite Arbour, MD  PARoxetine (PAXIL) 20 MG tablet Take 20 mg by mouth daily.   Yes Historical Provider, MD  polyethylene glycol (MIRALAX / GLYCOLAX) packet Take 17 g by mouth daily as needed for moderate constipation.    Yes Historical Provider, MD  potassium chloride (K-DUR) 10 MEQ tablet Take 20 mEq by mouth daily.   Yes Historical Provider, MD  pravastatin (PRAVACHOL) 40 MG tablet Take 40 mg by mouth at bedtime.   Yes Historical Provider, MD  salmeterol (SEREVENT) 50 MCG/DOSE diskus inhaler Inhale 1 puff into the lungs every 12 (twelve) hours. 04/19/15  Yes Marguarite Arbour, MD  spironolactone (ALDACTONE) 25 MG tablet Take 50 mg by mouth daily.    Yes Historical Provider, MD  tamsulosin (FLOMAX) 0.4 MG CAPS capsule Take 1 capsule (0.4 mg total) by mouth daily. 04/19/15  Yes Marguarite Arbour, MD  torsemide (DEMADEX) 100 MG tablet Take 1 tablet (100 mg total) by mouth daily. Patient taking differently: Take 50 mg by mouth daily.  05/03/15  Yes Marguarite Arbour, MD  warfarin (COUMADIN) 10 MG tablet Take 5-10 mg by mouth every evening. Pt takes 5mg  on Tuesday, Wednesday, Thursday, Saturday, and Sunday.   Pt takes 10mg  on Monday and Friday.   Yes Historical Provider, MD    REVIEW OF  SYSTEMS:  Review of Systems  Constitutional: Positive for malaise/fatigue. Negative for fever, chills and weight loss (patient has been having weight gain attributed to his increase fluid).  HENT: Negative for ear pain, hearing loss and tinnitus.   Eyes: Negative for blurred vision, double vision, pain and redness.  Respiratory: Negative for cough, hemoptysis and shortness of breath.   Cardiovascular: Positive for orthopnea and leg swelling. Negative for chest pain and palpitations.  Gastrointestinal: Positive for abdominal pain. Negative for nausea, vomiting, diarrhea and constipation.  Genitourinary: Negative for dysuria, frequency and hematuria.  Musculoskeletal: Negative for back pain, joint pain and neck pain.  Skin:       No acne, rash, or lesions  Neurological:  Positive for weakness. Negative for dizziness, tremors and focal weakness.  Endo/Heme/Allergies: Negative for polydipsia. Does not bruise/bleed easily.  Psychiatric/Behavioral: Negative for depression. The patient is not nervous/anxious and does not have insomnia.      VITAL SIGNS:   Filed Vitals:   05/27/15 1632 05/27/15 1942 05/27/15 2000 05/27/15 2100  BP: 123/61  123/79 122/81  Pulse: 57  52 64  Temp: 97.9 F (36.6 C)     TempSrc: Oral     Resp: 18  16 21   Height: 6' (1.829 m)     Weight: 178.717 kg (394 lb)     SpO2: 92% 92% 93% 88%   Wt Readings from Last 3 Encounters:  05/27/15 178.717 kg (394 lb)  05/03/15 169.464 kg (373 lb 9.6 oz)  04/25/15 169.464 kg (373 lb 9.6 oz)    PHYSICAL EXAMINATION:  Physical Exam  Vitals reviewed. Constitutional: He is oriented to person, place, and time. He appears well-developed. No distress.  HENT:  Head: Normocephalic and atraumatic.  Mouth/Throat: Oropharynx is clear and moist.  Eyes: Conjunctivae and EOM are normal. Pupils are equal, round, and reactive to light. No scleral icterus.  Neck: Normal range of motion. Neck supple. No JVD present. No thyromegaly present.   Cardiovascular: Normal rate and intact distal pulses.  Exam reveals no gallop and no friction rub.   No murmur heard. Irregular rhythm  Respiratory: Effort normal and breath sounds normal. No respiratory distress. He has no wheezes. He has no rales.  GI: Soft. Bowel sounds are normal. He exhibits no distension. There is no tenderness.  Musculoskeletal: Normal range of motion. He exhibits edema (2+ pitting edema all the way up his thighs and into his lower abdominal wall).  No arthritis, no gout  Lymphadenopathy:    He has no cervical adenopathy.  Neurological: He is alert and oriented to person, place, and time. No cranial nerve deficit.  No dysarthria, no aphasia  Skin: Skin is warm and dry. No rash noted. No erythema.  Psychiatric: He has a normal mood and affect. His behavior is normal. Judgment and thought content normal.    LABORATORY PANEL:   CBC  Recent Labs Lab 05/27/15 1648  WBC 6.0  HGB 9.9*  HCT 30.7*  PLT 236   ------------------------------------------------------------------------------------------------------------------  Chemistries   Recent Labs Lab 05/27/15 1648 05/27/15 1658  NA 124*  --   K 4.4  --   CL 85*  --   CO2 27  --   GLUCOSE 189*  --   BUN 50*  --   CREATININE 1.76*  --   CALCIUM 8.2*  --   AST  --  28  ALT  --  9*  ALKPHOS  --  175*  BILITOT  --  0.8   ------------------------------------------------------------------------------------------------------------------  Cardiac Enzymes  Recent Labs Lab 05/27/15 1648  TROPONINI <0.03   ------------------------------------------------------------------------------------------------------------------  RADIOLOGY:  Dg Chest 2 View  05/27/2015   CLINICAL DATA:  Weakness, intermittent running feeling in his chest. No pain.  EXAM: CHEST  2 VIEW  COMPARISON:  04/29/2015  FINDINGS: There is mild bilateral interstitial prominence with prominence of the central pulmonary vasculature. There  is persistent chronic airspace disease in the posterior left lower lobe unchanged compared with the prior exam. There is no other focal consolidation, pleural effusion or pneumothorax. There is stable cardiomegaly.  The osseous structures are unremarkable.  IMPRESSION: 1. Stable CHF.   Electronically Signed   By: Elige Ko   On: 05/27/2015 18:37  EKG:   Orders placed or performed during the hospital encounter of 05/27/15  . ED EKG within 10 minutes  . ED EKG within 10 minutes  . ED EKG  . ED EKG    IMPRESSION AND PLAN:  Principal Problem:   Acute on chronic diastolic congestive heart failure - patient has been having increasing weight gain with increasing edema. Patient has significant orthopnea baseline. He said multiple changes in his diuretic medication recently, and has been somewhat confused about the changes that were made. We will admit him, diurese with IV diuretics as his blood pressure tolerates. We'll get a cardiology consult. Active Problems:   Hyponatremia - unclear etiology, but likely due to significantly poor by mouth intake over the last several days. Patient is hemodynamically stable and does not clinically appear very dehydrated.  Given the fact that we need to diurese him because of his fluid status, we will not give him IV fluids at this time but will instead use SaltTabs to improve his sodium. I suspect that his weakness will improve some once his sodium comes up.   A-fib - rate controlled, on Coumadin, continue home dose for this or pharmacy consult   COPD, moderate - continue home inhalers   Type 2 diabetes mellitus - patient is on significant insulin at home, but we will have him on sliding scale here for now, and can restart his home insulin as needed if his blood glucose starts to rise.   Depression, major, recurrent, moderate - continue home meds   Hypothyroidism - home dose thyroid hormone replacement   HLD (hyperlipidemia) - continue home dose statin  All  the records are reviewed and case discussed with ED provider. Management plans discussed with the patient and/or family.  DVT PROPHYLAXIS: systemic anticoagulation  ADMISSION STATUS: Inpatient  CODE STATUS: Full  TOTAL TIME TAKING CARE OF THIS PATIENT: 45 minutes.    Taylen Wendland FIELDING 05/27/2015, 9:46 PM  [facility]  Office  [phone]  CC: Primary care physician; Marguarite Arbour, MD

## 2015-05-28 ENCOUNTER — Ambulatory Visit: Payer: Commercial Managed Care - HMO | Admitting: *Deleted

## 2015-05-28 ENCOUNTER — Encounter: Payer: Self-pay | Admitting: *Deleted

## 2015-05-28 LAB — TSH: TSH: 14.856 u[IU]/mL — ABNORMAL HIGH (ref 0.350–4.500)

## 2015-05-28 LAB — CBC
HEMATOCRIT: 30.6 % — AB (ref 40.0–52.0)
HEMOGLOBIN: 10 g/dL — AB (ref 13.0–18.0)
MCH: 25.9 pg — ABNORMAL LOW (ref 26.0–34.0)
MCHC: 32.7 g/dL (ref 32.0–36.0)
MCV: 79.2 fL — AB (ref 80.0–100.0)
Platelets: 248 10*3/uL (ref 150–440)
RBC: 3.86 MIL/uL — ABNORMAL LOW (ref 4.40–5.90)
RDW: 17.1 % — AB (ref 11.5–14.5)
WBC: 6 10*3/uL (ref 3.8–10.6)

## 2015-05-28 LAB — PROTIME-INR
INR: 3
Prothrombin Time: 31.2 seconds — ABNORMAL HIGH (ref 11.4–15.0)

## 2015-05-28 LAB — BASIC METABOLIC PANEL
ANION GAP: 11 (ref 5–15)
BUN: 52 mg/dL — AB (ref 6–20)
CHLORIDE: 87 mmol/L — AB (ref 101–111)
CO2: 28 mmol/L (ref 22–32)
Calcium: 8.6 mg/dL — ABNORMAL LOW (ref 8.9–10.3)
Creatinine, Ser: 1.66 mg/dL — ABNORMAL HIGH (ref 0.61–1.24)
GFR calc Af Amer: 51 mL/min — ABNORMAL LOW (ref >60)
GFR calc non Af Amer: 44 mL/min — ABNORMAL LOW (ref >60)
GLUCOSE: 183 mg/dL — AB (ref 65–99)
POTASSIUM: 4.2 mmol/L (ref 3.5–5.1)
Sodium: 126 mmol/L — ABNORMAL LOW (ref 135–145)

## 2015-05-28 LAB — GLUCOSE, CAPILLARY
GLUCOSE-CAPILLARY: 156 mg/dL — AB (ref 65–99)
GLUCOSE-CAPILLARY: 167 mg/dL — AB (ref 65–99)
GLUCOSE-CAPILLARY: 191 mg/dL — AB (ref 65–99)
Glucose-Capillary: 169 mg/dL — ABNORMAL HIGH (ref 65–99)
Glucose-Capillary: 180 mg/dL — ABNORMAL HIGH (ref 65–99)
Glucose-Capillary: 209 mg/dL — ABNORMAL HIGH (ref 65–99)

## 2015-05-28 LAB — OSMOLALITY: Osmolality: 283 mOsm/kg (ref 275–295)

## 2015-05-28 LAB — APTT: aPTT: 57 seconds — ABNORMAL HIGH (ref 24–36)

## 2015-05-28 MED ORDER — LEVOTHYROXINE SODIUM 75 MCG PO TABS
150.0000 ug | ORAL_TABLET | Freq: Every day | ORAL | Status: DC
  Administered 2015-05-29: 150 ug via ORAL
  Filled 2015-05-28 (×2): qty 2

## 2015-05-28 MED ORDER — WARFARIN - PHARMACIST DOSING INPATIENT
Freq: Every day | Status: DC
  Administered 2015-05-28: 18:00:00

## 2015-05-28 MED ORDER — ALUM & MAG HYDROXIDE-SIMETH 200-200-20 MG/5ML PO SUSP
30.0000 mL | ORAL | Status: DC | PRN
  Administered 2015-05-28: 30 mL via ORAL
  Filled 2015-05-28: qty 30

## 2015-05-28 MED ORDER — WARFARIN SODIUM 2.5 MG PO TABS: 5.0000 mg | ORAL_TABLET | Freq: Every day | ORAL | Status: DC

## 2015-05-28 MED ORDER — WARFARIN SODIUM 2.5 MG PO TABS: 10.0000 mg | ORAL_TABLET | ORAL | Status: DC

## 2015-05-28 MED ORDER — WARFARIN SODIUM 2.5 MG PO TABS
5.0000 mg | ORAL_TABLET | ORAL | Status: DC
  Administered 2015-05-28 – 2015-05-29 (×2): 5 mg via ORAL
  Filled 2015-05-28 (×2): qty 2

## 2015-05-28 NOTE — Progress Notes (Signed)
Informed patient of his rights to leave AMA and patient looked to wife to leave.  I questioned if the patient had a portable oxygen tank with him and he stated no.  Wife stated that she would take him home without oxygen.  I advised that this was not recommended since he uses oxygen on a daily basis.  Patient was still upset but stated that he would stay one more day and that was it.  Declined AMA at present moment.

## 2015-05-28 NOTE — Care Management (Signed)
Spoke with patient regarding discharge planning as patient frequents the hospital. He is from home with his wife and on chronic O2 but doesn't recall agency name. He has also used home health states he "didn't like it". I asked if we could try it again and he said "I think they gonna send me to a nursing home for a little while". I asked him if he was okay with that and he said "yeah, I guess".  I have notified Delice Bison CSW of this conversation.

## 2015-05-28 NOTE — Progress Notes (Signed)
Patient shouting profanaties and demands that bed alarm be turned off or he demands to leave the hospital.  Refused to take 0800 meds.  Spoke with Dr. Judithann Sheen advised of patients demands.  He said to inform patient that bed alarm must be in place and if not then he can leave AMA.  Informed patient.

## 2015-05-28 NOTE — Progress Notes (Signed)
Initial Nutrition Assessment   INTERVENTION:   Meals and Snacks: Cater to patient preferences, RD aware of fluid restriction to help with hyponatremia Education: CSW in with pt on visit, therefore RD left written materials at bed side. RD has educated pt multiple times on multiple past admissions even including providing materials mailed to pt's home. Will attempt to review materials again on follow as able.   NUTRITION DIAGNOSIS:   Limited adherence to nutrition-related recommendations related to chronic illness as evidenced by  (worsening lower leg edema, hyponatremia).  GOAL:   Patient will meet greater than or equal to 90% of their needs  MONITOR:    (Energy Intake, Digestive System, Anthropometrics, Glucose Profile)  REASON FOR ASSESSMENT:   Consult Diet education  ASSESSMENT:   Pt admitted with SOB, weight gain and worsening of LLE secondary to acute on chronic heart failure.   Past Medical History  Diagnosis Date  . Cataract   . CHF (congestive heart failure)   . COPD (chronic obstructive pulmonary disease)   . Diabetes mellitus without complication   . Sleep apnea   . Oxygen deficiency   . Thyroid disease   . Gout   . Hypothyroidism   . CKD (chronic kidney disease) stage 3, GFR 30-59 ml/min   . Asthma   . Hypertension     Diet Order:  Diet heart healthy/carb modified Room service appropriate?: Yes; Fluid consistency:: Thin; Fluid restriction:: 1800 mL Fluid    Current Nutrition: Per Nsg pt not pleased with fluid restriction presently. Recorded po intake 100% of breakfast this am, bites at lunch.  Food/Nutrition-Related History: Per MST no decrease in appetite PTA. Per MD note, pt eating only crackers and toast for past several days PTA.   Medications: Coumadin, IV Lasix, Novolog  Electrolyte/Renal Profile and Glucose Profile:   Recent Labs Lab 05/27/15 1648 05/28/15 0539  NA 124* 126*  K 4.4 4.2  CL 85* 87*  CO2 27 28  BUN 50* 52*  CREATININE  1.76* 1.66*  CALCIUM 8.2* 8.6*  GLUCOSE 189* 183*   Protein Profile:   Recent Labs Lab 05/27/15 1658 05/27/15 2256  ALBUMIN 2.7* 2.8*    Gastrointestinal Profile: Last BM:  05/28/2015   Weight Change: Pt with weight gain PTA.  Height:   Ht Readings from Last 1 Encounters:  05/27/15 6' (1.829 m)    Weight:   Wt Readings from Last 1 Encounters:  05/27/15 398 lb 14.4 oz (180.94 kg)    Wt Readings from Last 10 Encounters:  05/27/15 398 lb 14.4 oz (180.94 kg)  05/03/15 373 lb 9.6 oz (169.464 kg)  04/25/15 373 lb 9.6 oz (169.464 kg)  04/19/15 349 lb 6.4 oz (158.487 kg)  03/26/15 357 lb (161.934 kg)  03/12/15 337 lb (152.862 kg)  02/18/15 345 lb 14.4 oz (156.9 kg)    Ideal Body Weight:  80.9 kg   BMI:  Body mass index is 54.09 kg/(m^2).   LOW Care Level  Leda Quail, [place], [place] Pager [phone]

## 2015-05-28 NOTE — Evaluation (Signed)
Physical Therapy Evaluation Patient Details Name: Tyler [name] MRN: [id number] DOB: [birth year] Today's Date: 05/28/2015   History of Present Illness  Pt is a 59 yo male who was admitted to the hospital for COPD and CHF exacerbation.   Clinical Impression  Pt presents with hx of CHF, COPD, DM, Gout, CKD, and HTN. Examination reveals that pt needs min assist with bed mobility, transfers, and ambulation. Pt has generalized weakness that is limiting his ability to perform basic mobility.  Coupled with his decreased activity tolerance, pt will benefit from skilled PT in order to address his endurance and strength deficits so that he can safely return home. All mobility was performed on 2L O2 [place] with pt O2 sats never falling below 93%.     Follow Up Recommendations SNF;Supervision/Assistance - 24 hour    Equipment Recommendations   (Bariatric RW)    Recommendations for Other Services       Precautions / Restrictions Precautions Precautions: Fall Restrictions Weight Bearing Restrictions: No      Mobility  Bed Mobility Overal bed mobility: Needs Assistance Bed Mobility: Supine to Sit     Supine to sit: Min assist     General bed mobility comments: Pt requires assist for management of LEs, which he states are quite heavy. Pt has good use of hands to pull up from bed rails   Transfers Overall transfer level: Needs assistance Equipment used:  (Bariatric RW) Transfers: Sit to/from Stand           General transfer comment: Pt requires assist for stability of the walker and rising into standing. Pt requires cues for hand placement prior to transfer  Ambulation/Gait Ambulation/Gait assistance: Min assist Ambulation Distance (Feet): 10 Feet Assistive device:  (Bariatric RW) Gait Pattern/deviations: Decreased step length - right;Decreased step length - left Gait velocity: decreased Gait velocity interpretation: Below normal speed for age/gender General Gait Details: Pt  requires assist for advancement of RW and stability with ambulation   Stairs            Wheelchair Mobility    Modified Rankin (Stroke Patients Only)       Balance Overall balance assessment: No apparent balance deficits (not formally assessed)                                           Pertinent Vitals/Pain Pain Assessment: No/denies pain    Home Living Family/patient expects to be discharged to:: Private residence Living Arrangements: Spouse/significant other Available Help at Discharge: Family Type of Home: Mobile home Home Access: Ramped entrance     Home Layout: One level   Additional Comments: Wife is not able to provide 24 hr care if needed    Prior Function Level of Independence: Independent with assistive device(s)         Comments: Household ambulator with standard walker occasionally     Hand Dominance        Extremity/Trunk Assessment   Upper Extremity Assessment: Generalized weakness           Lower Extremity Assessment: Generalized weakness         Communication   Communication: No difficulties  Cognition Arousal/Alertness: Awake/alert Behavior During Therapy: WFL for tasks assessed/performed Overall Cognitive Status: Within Functional Limits for tasks assessed                      General  Comments      Exercises Other Exercises Other Exercises: Pt performed bilateral therex x 10 reps with supervision for proper technique. Exercises performed: ankle pumps, quad sets, glute sets, SAQ, and hip abd/add       Assessment/Plan    PT Assessment Patient needs continued PT services  PT Diagnosis Difficulty walking;Abnormality of gait;Generalized weakness   PT Problem List Decreased strength;Decreased range of motion;Decreased mobility;Decreased safety awareness;Cardiopulmonary status limiting activity;Obesity  PT Treatment Interventions DME instruction;Gait training;Stair training;Functional mobility  training;Therapeutic activities;Therapeutic exercise;Balance training;Neuromuscular re-education;Patient/family education;Manual techniques;Modalities   PT Goals (Current goals can be found in the Care Plan section) Acute Rehab PT Goals Patient Stated Goal: to return home PT Goal Formulation: With patient Time For Goal Achievement: 06/11/15 Potential to Achieve Goals: Good    Frequency Min 2X/week   Barriers to discharge        Co-evaluation               End of Session Equipment Utilized During Treatment: Gait belt;Oxygen Activity Tolerance: Patient tolerated treatment well Patient left: in chair;with chair alarm set;with call bell/phone within reach Nurse Communication: Mobility status         Time: 6195-0932 PT Time Calculation (min) (ACUTE ONLY): 31 min   Charges:         PT G CodesBenna Dunks 2015-06-23, 3:29 PM  Benna Dunks, SPT. [phone]

## 2015-05-28 NOTE — Consult Note (Signed)
[facility] Cardiology Consultation Note  Patient ID: Tyler [name], MRN: [id number], DOB/AGE: 03/15/1956 59 y.o. Admit date: 05/27/2015   Date of Consult: 05/28/2015 Primary Physician: Marguarite Arbour, MD Primary Cardiologist: Gwen Pounds  Chief Complaint:  Chief Complaint  Patient presents with  . Weakness   Reason for Consult: diastolic dysfunction heart failure with low sodium  HPI: 59 y.o. male with known chronic diastolic dysfunction congestive heart failure with the new onset of worsening lower extremity edema and ascites and PND and orthopnea most consistent with acute on chronic diastolic dysfunction heart failure. The patient has had multiple echocardiograms in the past showing normal LV systolic function. In addition to that the patient has significant pulmonary hypertension from multiple causes including possible chronic pulmonary embolism from deep venous thrombosis on warfarin as well as severe sleep apnea and for CPAP machine. The patient has never used to CPAP machine and will not go get his sleep study for further diagnosis and treatment. In addition to that the patient does have some chronic kidney disease stage III and anemia exacerbating his chronic heart failure. The patient has been having a treatment of essential hypertension mixed hyperlipidemia and diabetes with appropriate medication management. The patient continues to have intravenous Lasix as we speak and has had slight improvements of symptoms but still has chronic issues which need to be evaluated and treated. In the in hospital setting has not worked well for him and he will need skilled nursing for further appropriate treatment and reduction of possibility of rehospitalization  Past Medical History  Diagnosis Date  . Cataract   . CHF (congestive heart failure)   . COPD (chronic obstructive pulmonary disease)   . Diabetes mellitus without complication   . Sleep apnea   . Oxygen deficiency   . Thyroid  disease   . Gout   . Hypothyroidism   . CKD (chronic kidney disease) stage 3, GFR 30-59 ml/min   . Asthma   . Hypertension       Surgical History:  Past Surgical History  Procedure Laterality Date  . Cholecystectomy      2002  . Eye surgery      right cataract removed 3/21  . Vena cava filter placement       Home Meds: Prior to Admission medications   Medication Sig Start Date End Date Taking? Authorizing Provider  acetaminophen (TYLENOL) 500 MG tablet Take 1,000 mg by mouth every 6 (six) hours as needed for mild pain or headache.   Yes Historical Provider, MD  colchicine 0.6 MG tablet Take 0.6 mg by mouth 2 (two) times daily as needed (for wrist pain).   Yes Historical Provider, MD  cyanocobalamin (,VITAMIN B-12,) 1000 MCG/ML injection Inject 1,000 mcg into the muscle every 30 (thirty) days.   Yes Historical Provider, MD  cyanocobalamin 2000 MCG tablet Take 2,000 mcg by mouth daily.   Yes Historical Provider, MD  gabapentin (NEURONTIN) 100 MG capsule Take 100 mg by mouth 2 (two) times daily.   Yes Historical Provider, MD  insulin NPH Human (HUMULIN N,NOVOLIN N) 100 UNIT/ML injection Inject 90 Units into the skin 2 (two) times daily before a meal.    Yes Historical Provider, MD  insulin regular (NOVOLIN R,HUMULIN R) 100 units/mL injection Inject 10 Units into the skin 3 (three) times daily as needed for high blood sugar. Pt uses before meals as needed per sliding scale.   Yes Historical Provider, MD  ipratropium-albuterol (DUONEB) 0.5-2.5 (3) MG/3ML SOLN Take 3 mLs by  nebulization every 4 (four) hours as needed. Patient taking differently: Take 3 mLs by nebulization every 4 (four) hours as needed (for shortness of breath).  04/19/15  Yes Marguarite Arbour, MD  lactulose (CHRONULAC) 10 GM/15ML solution Take 30 g by mouth 2 (two) times daily as needed for mild constipation.    Yes Historical Provider, MD  levothyroxine (SYNTHROID, LEVOTHROID) 100 MCG tablet Take 100 mcg by mouth daily  before breakfast.   Yes Historical Provider, MD  metolazone (ZAROXOLYN) 5 MG tablet Take 5 mg by mouth daily.   Yes Historical Provider, MD  nystatin (MYCOSTATIN/NYSTOP) 100000 UNIT/GM POWD Apply 1 g topically 2 (two) times daily as needed (for rash).    Yes Historical Provider, MD  ondansetron (ZOFRAN) 4 MG tablet Take 1 tablet (4 mg total) by mouth every 6 (six) hours as needed for nausea. 04/19/15  Yes Marguarite Arbour, MD  PARoxetine (PAXIL) 20 MG tablet Take 20 mg by mouth daily.   Yes Historical Provider, MD  polyethylene glycol (MIRALAX / GLYCOLAX) packet Take 17 g by mouth daily as needed for moderate constipation.    Yes Historical Provider, MD  potassium chloride (K-DUR) 10 MEQ tablet Take 20 mEq by mouth daily.   Yes Historical Provider, MD  pravastatin (PRAVACHOL) 40 MG tablet Take 40 mg by mouth at bedtime.   Yes Historical Provider, MD  salmeterol (SEREVENT) 50 MCG/DOSE diskus inhaler Inhale 1 puff into the lungs every 12 (twelve) hours. 04/19/15  Yes Marguarite Arbour, MD  spironolactone (ALDACTONE) 25 MG tablet Take 50 mg by mouth daily.    Yes Historical Provider, MD  tamsulosin (FLOMAX) 0.4 MG CAPS capsule Take 1 capsule (0.4 mg total) by mouth daily. 04/19/15  Yes Marguarite Arbour, MD  torsemide (DEMADEX) 100 MG tablet Take 1 tablet (100 mg total) by mouth daily. Patient taking differently: Take 50 mg by mouth daily.  05/03/15  Yes Marguarite Arbour, MD  warfarin (COUMADIN) 10 MG tablet Take 5-10 mg by mouth every evening. Pt takes 5mg  on Tuesday, Wednesday, Thursday, Saturday, and Sunday.   Pt takes 10mg  on Monday and Friday.   Yes Historical Provider, MD    Inpatient Medications:  . furosemide  40 mg Intravenous Q8H  . insulin aspart  0-5 Units Subcutaneous QHS  . insulin aspart  0-9 Units Subcutaneous TID WC  . levothyroxine  150 mcg Oral QAC breakfast  . PARoxetine  20 mg Oral Daily  . pravastatin  40 mg Oral QHS  . salmeterol  1 puff Inhalation Q12H  . sodium chloride  1 g  Oral Daily  . spironolactone  50 mg Oral Daily  . tamsulosin  0.4 mg Oral Daily  . [START ON 05/31/2015] warfarin  10 mg Oral Once per day on Mon Fri  . warfarin  5 mg Oral Once per day on Sun Tue Wed Thu Sat  . Warfarin - Pharmacist Dosing Inpatient   Does not apply q1800      Allergies:  Allergies  Allergen Reactions  . Enalapril Cough  . Atorvastatin Palpitations  . Lovastatin Palpitations    Social History   Social History  . Marital Status: Married    Spouse Name: N/A  . Number of Children: N/A  . Years of Education: N/A   Occupational History  . Not on file.   Social History Main Topics  . Smoking status: Current Every Day Smoker -- 2.00 packs/day for 30 years    Types: Cigarettes, Cigars  . Smokeless  tobacco: Never Used     Comment: cigar 3-4 yrs 1-3 per day/former Cigarettes  . Alcohol Use: No  . Drug Use: No  . Sexual Activity: Not on file   Other Topics Concern  . Not on file   Social History Narrative     Family History  Problem Relation Age of Onset  . Diabetes Sister   . Diabetes Brother   . Deep vein thrombosis Mother   . CVA Father      Review of Systems Positive for shortness of breath PND orthopnea Negative for: General:  chills, fever, night sweats or weight changes.  Cardiovascular: Negative for syncope dizziness  Dermatological skin lesions rashes Respiratory: Cough congestion Urologic: Frequent urination urination at night and hematuria Abdominal: negative for nausea, vomiting, diarrhea, bright red blood per rectum, melena, or hematemesis Neurologic: negative for visual changes, and/or hearing changes  All other systems reviewed and are otherwise negative except as noted above.  Labs:  Recent Labs  05/27/15 1648  TROPONINI <0.03   Lab Results  Component Value Date   WBC 6.0 05/28/2015   HGB 10.0* 05/28/2015   HCT 30.6* 05/28/2015   MCV 79.2* 05/28/2015   PLT 248 05/28/2015    Recent Labs Lab 05/27/15 2256  05/28/15 0539  NA  --  126*  K  --  4.2  CL  --  87*  CO2  --  28  BUN  --  52*  CREATININE  --  1.66*  CALCIUM  --  8.6*  PROT 6.8  --   BILITOT 1.1  --   ALKPHOS 198*  --   ALT 9*  --   AST 27  --   GLUCOSE  --  183*   No results found for: CHOL, HDL, LDLCALC, TRIG No results found for: DDIMER  Radiology/Studies:  Dg Chest 2 View  05/27/2015   CLINICAL DATA:  Weakness, intermittent running feeling in his chest. No pain.  EXAM: CHEST  2 VIEW  COMPARISON:  04/29/2015  FINDINGS: There is mild bilateral interstitial prominence with prominence of the central pulmonary vasculature. There is persistent chronic airspace disease in the posterior left lower lobe unchanged compared with the prior exam. There is no other focal consolidation, pleural effusion or pneumothorax. There is stable cardiomegaly.  The osseous structures are unremarkable.  IMPRESSION: 1. Stable CHF.   Electronically Signed   By: Elige Ko   On: 05/27/2015 18:37   Dg Chest Portable 1 View  04/29/2015   CLINICAL DATA:  Left-sided chest pain today.  EXAM: PORTABLE CHEST - 1 VIEW  COMPARISON:  04/17/2015  FINDINGS: Enlargement of the cardiac silhouette flow most likely due to the previously demonstrated pericardial effusion. There is slight pulmonary vascular congestion. Increased density at the left lung base is most likely secondary to an increasing left effusion. Small right effusion.  IMPRESSION: Increasing moderate left effusion. Small right effusion. Slight pulmonary vascular congestion.   Electronically Signed   By: Francene Boyers M.D.   On: 04/29/2015 12:40    EKG: Normal sinus rhythm without evidence of myocardial infarction  Weights: Filed Weights   05/27/15 1632 05/27/15 2251  Weight: 394 lb (178.717 kg) 398 lb 14.4 oz (180.94 kg)     Physical Exam: Blood pressure 100/62, pulse 68, temperature 98.1 F (36.7 C), temperature source Oral, resp. rate 20, height 6' (1.829 m), weight 398 lb 14.4 oz (180.94 kg),  SpO2 98 %. Body mass index is 54.09 kg/(m^2). General: Well developed, well nourished, in  no acute distress. Head eyes ears nose throat: Normocephalic, atraumatic, sclera non-icteric, no xanthomas, nares are without discharge. No apparent thyromegaly and/or mass  Lungs: Normal respiratory effort. Diffuse wheezes, basilar rales, no rhonchi.  Heart: Irregular with normal S1 S2. no murmur gallop, no rub, PMI is normal size and placement, carotid upstroke normal without bruit, jugular venous pressure is normal Abdomen: Soft, non-tender,  distended with normoactive bowel sounds. Positive hepatomegaly. No rebound/guarding. No obvious abdominal masses. Abdominal aorta is not observed Extremities: 2+ edema. no cyanosis, no clubbing, positive ulcers  Peripheral : 2+ bilateral upper extremity pulses, o+ bilateral femoral pulses, 0+ bilateral dorsal pedal pulse Neuro: Alert and oriented. No facial asymmetry. No focal deficit. Moves all extremities spontaneously. Musculoskeletal: Normal muscle tone without kyphosis Psych:  Responds to questions appropriately with a normal affect.    Assessment: 59 year old male with known acute on chronic diastolic dysfunction congestive heart failure secondary to hypoxia sleep apnea chronic kidney disease stage III anemia and poor compliance with stable hypertension diabetes with complication and mixed hyperlipidemia  Plan: 1. Continue intravenous Lasix for lower extremity edema and acute on chronic diastolic dysfunction heart failure 2. No further cardiac diagnostics necessary at this time 3. Oxygenation 24 hours with supplemental oxygen and use of sleep apnea and CPAP machine for a primary cause of congestive heart failure 4. Treatment of anemia as necessary 5. Continue high intensity cholesterol therapy for further risk reduction in cardiovascular event 6. Patient to have skilled nursing for further treatment of severe pulmonary hypertension hypoxia and sleep apnea  with poor compliance  Signed, Lamar Blinks M.D. [facility] 05/28/2015, 8:01 AM

## 2015-05-28 NOTE — Progress Notes (Signed)
ANTICOAGULATION CONSULT NOTE - Initial Consult  Pharmacy Consult for [place]  Indication: atrial fibrillation  Allergies  Allergen Reactions  . Enalapril Cough  . Atorvastatin Palpitations  . Lovastatin Palpitations    Patient Measurements: Height: 6' (182.9 cm) Weight: (!) 398 lb 14.4 oz (180.94 kg) IBW/kg (Calculated) : 77.6 Heparin Dosing Weight:   Vital Signs: Temp: 98.1 F (36.7 C) (09/06 0707) Temp Source: Oral (09/06 0707) BP: 100/62 mmHg (09/06 0707) Pulse Rate: 68 (09/06 0707)  Labs:  Recent Labs  05/27/15 1648 05/28/15 0539  HGB 9.9* 10.0*  HCT 30.7* 30.6*  PLT 236 248  APTT  --  57*  LABPROT  --  31.2*  INR  --  3.00  CREATININE 1.76* 1.66*  TROPONINI <0.03  --     Estimated Creatinine Clearance: 80.6 mL/min (by C-G formula based on Cr of 1.66).   Medical History: Past Medical History  Diagnosis Date  . Cataract   . CHF (congestive heart failure)   . COPD (chronic obstructive pulmonary disease)   . Diabetes mellitus without complication   . Sleep apnea   . Oxygen deficiency   . Thyroid disease   . Gout   . Hypothyroidism   . CKD (chronic kidney disease) stage 3, GFR 30-59 ml/min   . Asthma   . Hypertension     Medications:  Scheduled:  . furosemide  40 mg Intravenous Q8H  . insulin aspart  0-5 Units Subcutaneous QHS  . insulin aspart  0-9 Units Subcutaneous TID WC  . levothyroxine  150 mcg Oral QAC breakfast  . PARoxetine  20 mg Oral Daily  . pravastatin  40 mg Oral QHS  . salmeterol  1 puff Inhalation Q12H  . sodium chloride  1 g Oral Daily  . spironolactone  50 mg Oral Daily  . tamsulosin  0.4 mg Oral Daily  . [START ON 05/31/2015] warfarin  10 mg Oral Once per day on Mon Fri  . warfarin  5 mg Oral Once per day on Sun Tue Wed Thu Sat  . Warfarin - Pharmacist Dosing Inpatient   Does not apply q1800    Assessment: Patient chronically on warfarin for atrial fibrillation   Home dose of warfarin 10 mg PO Mon and Friday and 5 mg  Tues, Wed, Thurs, Saturday, and Sunday.   Goal of Therapy:  INR 2-3 Monitor platelets by anticoagulation protocol: Yes   Plan:  Will give warfarin 5 mg PO today which is the patient's home dose on Tuesdays.   Jaque Dacy D 05/28/2015,7:58 AM

## 2015-05-28 NOTE — Progress Notes (Signed)
[facility]  ROUNDING NOTE   Subjective:   Admitted for weakness, worsening peripheral edema and acute diastolic congestive heart failure.  Sodium on admission of 124 Not using CPAP.  Home diuretic regimen of metolazone, spironolactone and torsemide. However torsemide dose decreased recently.    Objective:  Vital signs in last 24 hours:  Temp:  [97.6 F (36.4 C)-98.1 F (36.7 C)] 98.1 F (36.7 C) (09/06 0707) Pulse Rate:  [52-143] 68 (09/06 0707) Resp:  [12-22] 20 (09/06 0707) BP: (100-123)/(61-81) 100/62 mmHg (09/06 0707) SpO2:  [88 %-98 %] 98 % (09/06 0707) Weight:  [178.717 kg (394 lb)-180.94 kg (398 lb 14.4 oz)] 180.94 kg (398 lb 14.4 oz) (09/05 2251)  Weight change:  Filed Weights   05/27/15 1632 05/27/15 2251  Weight: 178.717 kg (394 lb) 180.94 kg (398 lb 14.4 oz)    Intake/Output:     Intake/Output this shift:  Total I/O In: 120 [P.O.:120] Out: 0   Physical Exam: General: NAD, obese, sitting on bed.   Head: Normocephalic, atraumatic. Moist oral mucosal membranes  Eyes: Anicteric, PERRL  Neck: Supple, trachea midline  Lungs:  Bilateral wheezes.   Heart: irregular  Abdomen:  Soft, nontender, obese, abdominal wall edema  Extremities: 3+ peripheral edema.  Neurologic: Nonfocal, moving all four extremities  Skin: No lesions       Basic Metabolic Panel:  Recent Labs Lab 05/27/15 1648 05/28/15 0539  NA 124* 126*  K 4.4 4.2  CL 85* 87*  CO2 27 28  GLUCOSE 189* 183*  BUN 50* 52*  CREATININE 1.76* 1.66*  CALCIUM 8.2* 8.6*    Liver Function Tests:  Recent Labs Lab 05/27/15 1658 05/27/15 2256  AST 28 27  ALT 9* 9*  ALKPHOS 175* 198*  BILITOT 0.8 1.1  PROT 6.5 6.8  ALBUMIN 2.7* 2.8*   No results for input(s): LIPASE, AMYLASE in the last 168 hours. No results for input(s): AMMONIA in the last 168 hours.  CBC:  Recent Labs Lab 05/27/15 1648 05/28/15 0539  WBC 6.0 6.0  HGB 9.9* 10.0*  HCT 30.7* 30.6*  MCV 79.3* 79.2*   PLT 236 248    Cardiac Enzymes:  Recent Labs Lab 05/27/15 1648  TROPONINI <0.03    BNP: Invalid input(s): POCBNP  CBG:  Recent Labs Lab 05/28/15 0033 05/28/15 0705 05/28/15 1110  GLUCAP 180* 156* 167*    Microbiology: Results for orders placed or performed in visit on 08/21/14  ED Influenza     Status: None   Collection Time: 08/21/14  4:53 PM  Result Value Ref Range Status   Influenza A By PCR NEGATIVE NEGATIVE Final   Influenza B By PCR NEGATIVE NEGATIVE Final   H1N1 flu by pcr NOT DETECTED NOT-DETECTED Final    Comment:                  ----------------------- The Xpert Flu assay (FDA approval for nasal aspirates or washes and nasopharyngeal swab specimens), is intended as an aid in the diagnosis of influenza and should not be used as a sole basis for treatment.     Coagulation Studies:  Recent Labs  05/28/15 0539  LABPROT 31.2*  INR 3.00    Urinalysis: No results for input(s): COLORURINE, LABSPEC, PHURINE, GLUCOSEU, HGBUR, BILIRUBINUR, KETONESUR, PROTEINUR, UROBILINOGEN, NITRITE, LEUKOCYTESUR in the last 72 hours.  Invalid input(s): APPERANCEUR    Imaging: Dg Chest 2 View  05/27/2015   CLINICAL DATA:  Weakness, intermittent running feeling in his chest. No pain.  EXAM: CHEST  2 VIEW  COMPARISON:  04/29/2015  FINDINGS: There is mild bilateral interstitial prominence with prominence of the central pulmonary vasculature. There is persistent chronic airspace disease in the posterior left lower lobe unchanged compared with the prior exam. There is no other focal consolidation, pleural effusion or pneumothorax. There is stable cardiomegaly.  The osseous structures are unremarkable.  IMPRESSION: 1. Stable CHF.   Electronically Signed   By: Kathreen Devoid   On: 05/27/2015 18:37     Medications:     . furosemide  40 mg Intravenous Q8H  . insulin aspart  0-5 Units Subcutaneous QHS  . insulin aspart  0-9 Units Subcutaneous TID WC  . levothyroxine  150 mcg  Oral QAC breakfast  . PARoxetine  20 mg Oral Daily  . pravastatin  40 mg Oral QHS  . salmeterol  1 puff Inhalation Q12H  . sodium chloride  1 g Oral Daily  . spironolactone  50 mg Oral Daily  . tamsulosin  0.4 mg Oral Daily  . [START ON 05/31/2015] warfarin  10 mg Oral Once per day on Mon Fri  . warfarin  5 mg Oral Once per day on Sun Tue Wed Thu Sat  . Warfarin - Pharmacist Dosing Inpatient   Does not apply q1800   acetaminophen **OR** acetaminophen, ondansetron **OR** ondansetron (ZOFRAN) IV  Assessment/ Plan:  [name] is a 59 y.o. white male with bilateral pulmonary emboli, DVTs, hypercoagulable disorder, varicose veins, morbid obesity, chronic kidney disease with baseline creatinine 1.5-1.8, chronic history failure, obstructive sleep apnea, diabetes type 2 with nephropathy, diabetic retinopathy, neuropathy, chronic atrial fibrillation, chronic lower back pain, diastolic congestive heart failure  1. Chronic Kidney Disease stage III: creatinine of 1.66 with eGFR of 44. At baseline.  - Continue to monitor volume status, urine output and renal function - not currently on an ACE-I/ARB. Has allergy to enalapril - Renally dose all medications - Paroxetine can cause SIADH, will monitor.   2. Hyponatremia: 125 on 8/30 and then 124 on 9/5.  - secondary to metolazone (thiazide type diuretic) - continue fluid restriction 1857m/day - Continue furosemide.   3. Acute exacerbation of congestive heart failure: diastolic dysfunction. EF 50-55% 02/16/15.  - furosemide and spironactone.   4. Diabetes Mellitus type II with renal manifestations: continue glucose control  5. Hypertension: hypotensive on admission. On tamsulosin and diuretics.  -     L[place]: [place]/6/201612:14 PM

## 2015-05-28 NOTE — Patient Outreach (Signed)
Error entry.   Pt currently in the hospital.       Shayne Alken.   Mycheal Veldhuizen RN CCM [facility] Care Management  [phone]

## 2015-05-28 NOTE — Progress Notes (Signed)
Tyler [name] is a 59 y.o. male  Acute on chronic diastolic congestive heart failure   SUBJECTIVE:  Pt readmitted for weakness and hyponatremia. CHF and oxygenation stable/unchanged. This is his 3rd admission in the past 6 weeks. Non-compliant with meds and home health. Denies CP. Has chronic SOB and dyspnea. Feels "weak". Not ambulating.  ______________________________________________________________________  ROS: Review of systems is unremarkable for any active cardiac,respiratory, GI, GU, hematologic, neurologic or psychiatric systems, 10 systems reviewed.  @  Past Medical History  Diagnosis Date  . Cataract   . CHF (congestive heart failure)   . COPD (chronic obstructive pulmonary disease)   . Diabetes mellitus without complication   . Sleep apnea   . Oxygen deficiency   . Thyroid disease   . Gout   . Hypothyroidism   . CKD (chronic kidney disease) stage 3, GFR 30-59 ml/min   . Asthma   . Hypertension     Past Surgical History  Procedure Laterality Date  . Cholecystectomy      2002  . Eye surgery      right cataract removed 3/21  . Vena cava filter placement      PHYSICAL EXAM:  BP 100/62 mmHg  Pulse 68  Temp(Src) 98.1 F (36.7 C) (Oral)  Resp 20  Ht 6' (1.829 m)  Wt 180.94 kg (398 lb 14.4 oz)  BMI 54.09 kg/m2  SpO2 98%  Wt Readings from Last 3 Encounters:  05/27/15 180.94 kg (398 lb 14.4 oz)  05/03/15 169.464 kg (373 lb 9.6 oz)  04/25/15 169.464 kg (373 lb 9.6 oz)            Constitutional: NAD Neck: supple, no thyromegaly Respiratory: basilar rales Cardiovascular: RRR, no murmur, no gallop Abdomen: soft, good BS, nontender Extremities: 3+ edema Neuro: alert and oriented, no focal motor or sensory deficits  ASSESSMENT/PLAN:  Labs and imaging studies were reviewed  Will resume CPAP. Cardiology and Nephrology consults noted. Will consult PT and CSW. Repeat labs in AM. Continue IV Lasix. Pt will need long term placement.

## 2015-05-28 NOTE — Patient Outreach (Signed)
[facility]) Care Management  05/28/2015  KATHY WARES 1956-04-28 [id number]   I called Mr. Bihl today to enroll him into Extra Help program. I was given permission to speak with his wife. They was willing to do it over the phone.  We completed the application online and submitted it today.  They should hear from social security administration in the next 2 to 4 weeks.  His wife is going to call me back when she hears from them. I will follow up in about 4 weeks.  Adlyn Fife L. Christianna Belmonte, AAS [facility] Care Management Assistant

## 2015-05-29 LAB — BASIC METABOLIC PANEL
Anion gap: 10 (ref 5–15)
BUN: 49 mg/dL — ABNORMAL HIGH (ref 6–20)
CALCIUM: 8.5 mg/dL — AB (ref 8.9–10.3)
CO2: 30 mmol/L (ref 22–32)
CREATININE: 1.54 mg/dL — AB (ref 0.61–1.24)
Chloride: 90 mmol/L — ABNORMAL LOW (ref 101–111)
GFR calc Af Amer: 55 mL/min — ABNORMAL LOW (ref >60)
GFR calc non Af Amer: 48 mL/min — ABNORMAL LOW (ref >60)
GLUCOSE: 152 mg/dL — AB (ref 65–99)
Potassium: 3.9 mmol/L (ref 3.5–5.1)
Sodium: 130 mmol/L — ABNORMAL LOW (ref 135–145)

## 2015-05-29 LAB — CBC WITH DIFFERENTIAL/PLATELET
Basophils Absolute: 0 10*3/uL (ref 0–0.1)
Basophils Relative: 1 %
Eosinophils Absolute: 0.1 10*3/uL (ref 0–0.7)
Eosinophils Relative: 2 %
HEMATOCRIT: 30.2 % — AB (ref 40.0–52.0)
Hemoglobin: 10 g/dL — ABNORMAL LOW (ref 13.0–18.0)
LYMPHS PCT: 15 %
Lymphs Abs: 0.8 10*3/uL — ABNORMAL LOW (ref 1.0–3.6)
MCH: 26.5 pg (ref 26.0–34.0)
MCHC: 33 g/dL (ref 32.0–36.0)
MCV: 80.2 fL (ref 80.0–100.0)
MONO ABS: 0.7 10*3/uL (ref 0.2–1.0)
MONOS PCT: 13 %
NEUTROS ABS: 4 10*3/uL (ref 1.4–6.5)
Neutrophils Relative %: 71 %
Platelets: 214 10*3/uL (ref 150–440)
RBC: 3.77 MIL/uL — ABNORMAL LOW (ref 4.40–5.90)
RDW: 17 % — AB (ref 11.5–14.5)
WBC: 5.7 10*3/uL (ref 3.8–10.6)

## 2015-05-29 LAB — GLUCOSE, CAPILLARY
GLUCOSE-CAPILLARY: 185 mg/dL — AB (ref 65–99)
GLUCOSE-CAPILLARY: 174 mg/dL — AB (ref 65–99)
Glucose-Capillary: 167 mg/dL — ABNORMAL HIGH (ref 65–99)

## 2015-05-29 LAB — PROTIME-INR
INR: 2.84
Prothrombin Time: 29.9 seconds — ABNORMAL HIGH (ref 11.4–15.0)

## 2015-05-29 MED ORDER — FUROSEMIDE 40 MG PO TABS
80.0000 mg | ORAL_TABLET | Freq: Two times a day (BID) | ORAL | Status: DC
  Administered 2015-05-29 – 2015-05-30 (×3): 80 mg via ORAL
  Filled 2015-05-29 (×3): qty 2

## 2015-05-29 MED ORDER — FLUTICASONE PROPIONATE 50 MCG/ACT NA SUSP
2.0000 | Freq: Every day | NASAL | Status: DC
  Administered 2015-05-29 – 2015-05-30 (×2): 2 via NASAL
  Filled 2015-05-29: qty 16

## 2015-05-29 NOTE — Progress Notes (Signed)
CSW gave Pt list of bed offers and area SNFs to discuss with his wife this evening. Pt aware of dc on Wednesday. [facility] contacted to discuss preauth for SNF as well. CSW will follow up in the morning for decision.   Wilford Grist, LCSW [phone]

## 2015-05-29 NOTE — Clinical Social Work Placement (Signed)
   CLINICAL SOCIAL WORK PLACEMENT  NOTE  Date:  05/29/2015  Patient Details  Name: Tyler [name] MRN: [id number] Date of Birth: 1956-02-28  Clinical Social Work is seeking post-discharge placement for this patient at the Skilled  Nursing Facility level of care (*CSW will initial, date and re-position this form in  chart as items are completed):  Yes   Patient/family provided with [place] Clinical Social Work Department's list of facilities offering this level of care within the geographic area requested by the patient (or if unable, by the patient's family).  Yes   Patient/family informed of their freedom to choose among providers that offer the needed level of care, that participate in Medicare, Medicaid or managed care program needed by the patient, have an available bed and are willing to accept the patient.  Yes   Patient/family informed of [facility]'s ownership interest in [facility] and [facility], as well as of the fact that they are under no obligation to receive care at these facilities.  PASRR submitted to EDS on       PASRR number received on       Existing PASRR number confirmed on       FL2 transmitted to all facilities in geographic area requested by pt/family on 05/28/15     FL2 transmitted to all facilities within larger geographic area on       Patient informed that his/her managed care company has contracts with or will negotiate with certain facilities, including the following:            Patient/family informed of bed offers received.  Patient chooses bed at       Physician recommends and patient chooses bed at      Patient to be transferred to   on  .  Patient to be transferred to facility by       Patient family notified on   of transfer.  Name of family member notified:        PHYSICIAN Please sign FL2, Please prepare prescriptions     Additional Comment:    _______________________________________________ Ned Card,  LCSW 05/29/2015, 6:37 AM

## 2015-05-29 NOTE — Care Management Important Message (Signed)
Important Message  Patient Details  Name: Tyler A ArringtoSHAHAB [name] Date of Birth: June 23, 1956   Medicare Important Message Given:  Yes-second notification given    Tyler [name] 05/29/2015, 9:58 AM

## 2015-05-29 NOTE — Clinical Social Work Note (Addendum)
Late entry 05/29/15 for 05/28/15 Clinical Social Work Assessment  Patient Details  Name: Tyler [name] MRN: [id number] Date of Birth: 1955/12/01  Date of referral:  05/28/15               Reason for consult:  Facility Placement, Frequent Admissions / ED Visits                Permission sought to share information with:  Case Manager, Facility Medical sales representative, Family Supports Permission granted to share information::  Yes, Verbal Permission Granted  Name::        Agency::  SNFs, [facility]  Relationship::  Wife, son  Contact Information:     Housing/Transportation Living arrangements for the past 2 months:  Single Family Home Source of Information:  Patient Patient Interpreter Needed:  None Criminal Activity/Legal Involvement Pertinent to Current Situation/Hospitalization:  No - Comment as needed Significant Relationships:  Adult Children, Spouse Lives with:  Adult Children, Spouse Do you feel safe going back to the place where you live?  Yes Need for family participation in patient care:  Yes (Comment)  Care giving concerns:  Pt lives with his wife and son. Wife works during the day and son works 3rd shift.    Social Worker assessment / plan:  CSW was referred to pt to assist with dc planning. Pt is married to his wife of 30+ years and has 1 adult son in the home. Pt used to work in several jobs, all requiring manual labor. Pt is unable to work due to his chronic illnesses and is disabled at this time. Per staff reports and chart review, Pt frequents the ED and has required multiple hospitalizations in the last 6 months due to medical non-compliance. Pt shares that he does not have home health because "they weren't doing anything." Pt has declined SNF in the past, but is agreeable to SNF for STR at this time. CSW and Pt discussed options and likely dc on Wednesday. CSW will begin SNF bed search and give Pt options at the end of today.   Employment status:  Disabled (Comment on  whether or not currently receiving Disability) Insurance information:  Managed Medicare PT Recommendations:  Skilled Nursing Facility Information / Referral to community resources:  Skilled Nursing Facility  Patient/Family's Response to care:  Pt shared that this is the "longest I have been away from my wife." Pt has told staff that he will leave on Wednesday.   Patient/Family's Understanding of and Emotional Response to Diagnosis, Current Treatment, and Prognosis:  Pt is engaged with [facility] for intensive case  management and disease education due to history of non-compliance. Pt asks CSW how long it will take to get better. When CSW mentions CHF, Pt states "not that, what's going on now. Dr. Judithann [name] is always talking about heart failure."   Emotional Assessment Appearance:  Appears older than stated age Attitude/Demeanor/Rapport:  Apprehensive, Reactive Affect (typically observed):  Anxious, Accepting Orientation:  Oriented to Situation, Oriented to  Time, Oriented to Place, Oriented to Self Alcohol / Substance use:  Never Used Psych involvement (Current and /or in the community):  No (Comment)  Discharge Needs  Concerns to be addressed:  Patient refuses services, Adjustment to Illness, Compliance Issues Concerns Readmission within the last 30 days:  Yes Current discharge risk:  Chronically ill, Dependent with Mobility, Physical Impairment Barriers to Discharge:  No Barriers Identified   Ned Card, LCSW 05/29/2015, 6:32 AM

## 2015-05-29 NOTE — Progress Notes (Signed)
CSW provided SNF bed offers to patient and his wife- they have selected [facility] as this is close to their home and they can provide a private room which is their preference. CSW has communicated to [facility] Medicare of anticipated dc tomorrow.     Reece Levy, MSW, [facility]  [phone]

## 2015-05-29 NOTE — Progress Notes (Signed)
Tyler [name] is a 59 y.o. male  Acute on chronic diastolic congestive heart failure   SUBJECTIVE:  Pt in NAD. Sodium improving. Used CPAP last night for only 5 hours. Denies CP. Still with SOB and dyspnea. Oxygenation stable on [place] O2.  ______________________________________________________________________  ROS: Review of systems is unremarkable for any active cardiac,respiratory, GI, GU, hematologic, neurologic or psychiatric systems, 10 systems reviewed.  @  Past Medical History  Diagnosis Date  . Cataract   . CHF (congestive heart failure)   . COPD (chronic obstructive pulmonary disease)   . Diabetes mellitus without complication   . Sleep apnea   . Oxygen deficiency   . Thyroid disease   . Gout   . Hypothyroidism   . CKD (chronic kidney disease) stage 3, GFR 30-59 ml/min   . Asthma   . Hypertension     Past Surgical History  Procedure Laterality Date  . Cholecystectomy      2002  . Eye surgery      right cataract removed 3/21  . Vena cava filter placement      PHYSICAL EXAM:  BP 114/67 mmHg  Pulse 67  Temp(Src) 97.9 F (36.6 C) (Oral)  Resp 25  Ht 6' (1.829 m)  Wt 181.6 kg (400 lb 5.7 oz)  BMI 54.29 kg/m2  SpO2 98%  Wt Readings from Last 3 Encounters:  05/29/15 181.6 kg (400 lb 5.7 oz)  05/03/15 169.464 kg (373 lb 9.6 oz)  04/25/15 169.464 kg (373 lb 9.6 oz)            Constitutional: NAD Neck: supple, no thyromegaly Respiratory: basilar rales, no wheezes Cardiovascular: RRR, no murmur, no gallop Abdomen: soft, good BS, nontender Extremities: 3+ edema Neuro: alert and oriented, no focal motor or sensory deficits  ASSESSMENT/PLAN:  Labs and imaging studies were reviewed  Will change to po Lasix. Resume Flonase. Continue coumadin. Repeat labs in AM. Hope to D/C to SNF tomorrow.

## 2015-05-29 NOTE — Progress Notes (Signed)
[facility]  ROUNDING NOTE   Subjective:   Patient switched to PO furosemide Epistaxis from his CPAP  Objective:  Vital signs in last 24 hours:  Temp:  [97.8 F (36.6 C)-98 F (36.7 C)] 97.9 F (36.6 C) (09/07 0757) Pulse Rate:  [66-70] 70 (09/07 0757) Resp:  [18-25] 18 (09/07 0757) BP: (101-130)/(59-82) 101/59 mmHg (09/07 0757) SpO2:  [93 %-98 %] 97 % (09/07 0757) Weight:  [181.6 kg (400 lb 5.7 oz)] 181.6 kg (400 lb 5.7 oz) (09/07 0600)  Weight change: 2.883 kg (6 lb 5.7 oz) Filed Weights   05/27/15 1632 05/27/15 2251 05/29/15 0600  Weight: 178.717 kg (394 lb) 180.94 kg (398 lb 14.4 oz) 181.6 kg (400 lb 5.7 oz)    Intake/Output: I/O last 3 completed shifts: In: 480 [P.O.:480] Out: 0    Intake/Output this shift:  Total I/O In: 240 [P.O.:240] Out: -   Physical Exam: General: NAD, obese, sitting on bed.   Head: Normocephalic, atraumatic. Moist oral mucosal membranes  Eyes: Anicteric, PERRL  Neck: Supple, trachea midline  Lungs:  Clear   Heart: irregular  Abdomen:  Soft, nontender, obese, abdominal wall edema  Extremities: 3+ peripheral edema.  Neurologic: Nonfocal, moving all four extremities  Skin: No lesions       Basic Metabolic Panel:  Recent Labs Lab 05/27/15 1648 05/28/15 0539 05/29/15 0452  NA 124* 126* 130*  K 4.4 4.2 3.9  CL 85* 87* 90*  CO2 27 28 30   GLUCOSE 189* 183* 152*  BUN 50* 52* 49*  CREATININE 1.76* 1.66* 1.54*  CALCIUM 8.2* 8.6* 8.5*    Liver Function Tests:  Recent Labs Lab 05/27/15 1658 05/27/15 2256  AST 28 27  ALT 9* 9*  ALKPHOS 175* 198*  BILITOT 0.8 1.1  PROT 6.5 6.8  ALBUMIN 2.7* 2.8*   No results for input(s): LIPASE, AMYLASE in the last 168 hours. No results for input(s): AMMONIA in the last 168 hours.  CBC:  Recent Labs Lab 05/27/15 1648 05/28/15 0539 05/29/15 0452  WBC 6.0 6.0 5.7  NEUTROABS  --   --  4.0  HGB 9.9* 10.0* 10.0*  HCT 30.7* 30.6* 30.2*  MCV 79.3* 79.2* 80.2  PLT 236  248 214    Cardiac Enzymes:  Recent Labs Lab 05/27/15 1648  TROPONINI <0.03    BNP: Invalid input(s): POCBNP  CBG:  Recent Labs Lab 05/28/15 0705 05/28/15 1110 05/28/15 1300 05/28/15 1600 05/28/15 2045  GLUCAP 156* 167* 191* 169* 209*    Microbiology: Results for orders placed or performed in visit on 08/21/14  ED Influenza     Status: None   Collection Time: 08/21/14  4:53 PM  Result Value Ref Range Status   Influenza A By PCR NEGATIVE NEGATIVE Final   Influenza B By PCR NEGATIVE NEGATIVE Final   H1N1 flu by pcr NOT DETECTED NOT-DETECTED Final    Comment:                  ----------------------- The Xpert Flu assay (FDA approval for nasal aspirates or washes and nasopharyngeal swab specimens), is intended as an aid in the diagnosis of influenza and should not be used as a sole basis for treatment.     Coagulation Studies:  Recent Labs  05/28/15 0539 05/29/15 0452  LABPROT 31.2* 29.9*  INR 3.00 2.84    Urinalysis: No results for input(s): COLORURINE, LABSPEC, PHURINE, GLUCOSEU, HGBUR, BILIRUBINUR, KETONESUR, PROTEINUR, UROBILINOGEN, NITRITE, LEUKOCYTESUR in the last 72 hours.  Invalid input(s): APPERANCEUR  Imaging: Dg Chest 2 View  05/27/2015   CLINICAL DATA:  Weakness, intermittent running feeling in his chest. No pain.  EXAM: CHEST  2 VIEW  COMPARISON:  04/29/2015  FINDINGS: There is mild bilateral interstitial prominence with prominence of the central pulmonary vasculature. There is persistent chronic airspace disease in the posterior left lower lobe unchanged compared with the prior exam. There is no other focal consolidation, pleural effusion or pneumothorax. There is stable cardiomegaly.  The osseous structures are unremarkable.  IMPRESSION: 1. Stable CHF.   Electronically Signed   By: Kathreen Devoid   On: 05/27/2015 18:37     Medications:     . fluticasone  2 spray Each Nare Daily  . furosemide  80 mg Oral BID  . insulin aspart  0-5 Units  Subcutaneous QHS  . insulin aspart  0-9 Units Subcutaneous TID WC  . levothyroxine  150 mcg Oral QAC breakfast  . PARoxetine  20 mg Oral Daily  . pravastatin  40 mg Oral QHS  . salmeterol  1 puff Inhalation Q12H  . sodium chloride  1 g Oral Daily  . spironolactone  50 mg Oral Daily  . tamsulosin  0.4 mg Oral Daily  . [START ON 05/31/2015] warfarin  10 mg Oral Once per day on Mon Fri  . warfarin  5 mg Oral Once per day on Sun Tue Wed Thu Sat  . Warfarin - Pharmacist Dosing Inpatient   Does not apply q1800   acetaminophen **OR** acetaminophen, alum & mag hydroxide-simeth, ondansetron **OR** ondansetron (ZOFRAN) IV  Assessment/ Plan:  [name] is a 59 y.o. white male with bilateral pulmonary emboli, DVTs, hypercoagulable disorder, varicose veins, morbid obesity, chronic kidney disease with baseline creatinine 1.5-1.8, chronic history failure, obstructive sleep apnea, diabetes type 2 with nephropathy, diabetic retinopathy, neuropathy, chronic atrial fibrillation, chronic lower back pain, diastolic congestive heart failure  1. Chronic Kidney Disease stage III: creatinine at baseline. 1.54, eGFR of 48 - Continue to monitor volume status, urine output and renal function - not currently on an ACE-I/ARB. Has allergy to enalapril - Renally dose all medications  2. Hyponatremia: improved to 130.  - secondary to metolazone (thiazide type diuretic) and congestive heart failure - continue fluid restriction 1893m/day - Continue furosemide.  - Paroxetine can cause SIADH, will monitor.   3. Acute exacerbation of congestive heart failure: diastolic dysfunction. EF 50-55% 02/16/15.  - furosemide and spironactone.  - appreciate cardiology input.   4. Diabetes Mellitus type II with renal manifestations: continue glucose control  5. Hypertension: hypotensive on admission. On tamsulosin and diuretics.      LOS: 2 Tyler [name] 9/7/201610:45 AM

## 2015-05-29 NOTE — Progress Notes (Signed)
[facility] Cardiology [facility] Encounter Note  Patient: Tyler [name] / Admit Date: 05/27/2015 / Date of Encounter: 05/29/2015, 8:36 AM   Subjective: Patient still with edema and shortness of breath slightly improved  Review of Systems: Positive for: Edema and shortness of breath Negative for: Vision change, hearing change, syncope, dizziness, nausea, vomiting,diarrhea, bloody stool, stomach pain, cough, congestion, diaphoresis, urinary frequency, urinary pain,skin lesions, skin rashes Others previously listed  Objective: Telemetry: Normal sinus rhythm Physical Exam: Blood pressure 101/59, pulse 70, temperature 97.9 F (36.6 C), temperature source Oral, resp. rate 18, height 6' (1.829 m), weight 400 lb 5.7 oz (181.6 kg), SpO2 97 %. Body mass index is 54.29 kg/(m^2). General: Well developed, well nourished, in no acute distress. Head: Normocephalic, atraumatic, sclera non-icteric, no xanthomas, nares are without discharge. Neck: No apparent masses Lungs: Normal respirations with diffuse wheezes, no rhonchi, no rales , basilar crackles   Heart: Regular rate and rhythm, normal S1 S2, no murmur, no rub, no gallop, PMI is diffuse placement, carotid upstroke normal without bruit, jugular venous pressure normal Abdomen: Soft, non-tender, non-distended with normoactive bowel sounds. No hepatosplenomegaly. Abdominal aorta is not heard  Extremities: 2+ edema, no clubbing, no cyanosis, positive ulcers,  Peripheral: 2+ radial, 0 + femoral, 0 + dorsal pedal pulses Neuro: Alert and oriented. Moves all extremities spontaneously. Psych:  Responds to questions appropriately with a normal affect.   Intake/Output Summary (Last 24 hours) at 05/29/15 0836 Last data filed at 05/29/15 0400  Gross per 24 hour  Intake    480 ml  Output      0 ml  Net    480 ml    Inpatient Medications:  . fluticasone  2 spray Each Nare Daily  . furosemide  80 mg Oral BID  . insulin aspart  0-5 Units  Subcutaneous QHS  . insulin aspart  0-9 Units Subcutaneous TID WC  . levothyroxine  150 mcg Oral QAC breakfast  . PARoxetine  20 mg Oral Daily  . pravastatin  40 mg Oral QHS  . salmeterol  1 puff Inhalation Q12H  . sodium chloride  1 g Oral Daily  . spironolactone  50 mg Oral Daily  . tamsulosin  0.4 mg Oral Daily  . [START ON 05/31/2015] warfarin  10 mg Oral Once per day on Mon Fri  . warfarin  5 mg Oral Once per day on Sun Tue Wed Thu Sat  . Warfarin - Pharmacist Dosing Inpatient   Does not apply q1800   Infusions:    Labs:  Recent Labs  05/28/15 0539 05/29/15 0452  NA 126* 130*  K 4.2 3.9  CL 87* 90*  CO2 28 30  GLUCOSE 183* 152*  BUN 52* 49*  CREATININE 1.66* 1.54*  CALCIUM 8.6* 8.5*    Recent Labs  05/27/15 1658 05/27/15 2256  AST 28 27  ALT 9* 9*  ALKPHOS 175* 198*  BILITOT 0.8 1.1  PROT 6.5 6.8  ALBUMIN 2.7* 2.8*    Recent Labs  05/28/15 0539 05/29/15 0452  WBC 6.0 5.7  NEUTROABS  --  4.0  HGB 10.0* 10.0*  HCT 30.6* 30.2*  MCV 79.2* 80.2  PLT 248 214    Recent Labs  05/27/15 1648  TROPONINI <0.03   Invalid input(s): POCBNP No results for input(s): HGBA1C in the last 72 hours.   Weights: Filed Weights   05/27/15 1632 05/27/15 2251 05/29/15 0600  Weight: 394 lb (178.717 kg) 398 lb 14.4 oz (180.94 kg) 400 lb 5.7 oz (181.6  kg)     Radiology/Studies:  Dg Chest 2 View  05/27/2015   CLINICAL DATA:  Weakness, intermittent running feeling in his chest. No pain.  EXAM: CHEST  2 VIEW  COMPARISON:  04/29/2015  FINDINGS: There is mild bilateral interstitial prominence with prominence of the central pulmonary vasculature. There is persistent chronic airspace disease in the posterior left lower lobe unchanged compared with the prior exam. There is no other focal consolidation, pleural effusion or pneumothorax. There is stable cardiomegaly.  The osseous structures are unremarkable.  IMPRESSION: 1. Stable CHF.   Electronically Signed   By: Elige Ko   On:  05/27/2015 18:37   Dg Chest Portable 1 View  04/29/2015   CLINICAL DATA:  Left-sided chest pain today.  EXAM: PORTABLE CHEST - 1 VIEW  COMPARISON:  04/17/2015  FINDINGS: Enlargement of the cardiac silhouette flow most likely due to the previously demonstrated pericardial effusion. There is slight pulmonary vascular congestion. Increased density at the left lung base is most likely secondary to an increasing left effusion. Small right effusion.  IMPRESSION: Increasing moderate left effusion. Small right effusion. Slight pulmonary vascular congestion.   Electronically Signed   By: Francene Boyers M.D.   On: 04/29/2015 12:40     Assessment and Recommendation  59 y.o. male with acute on chronic diastolic dysfunction congestive heart failure with chronic kidney disease stage III essential hypertension diabetes with complication anemia mixed hyperlipidemia with hypoxia and sleep apnea Further treatment options 1. Continue intravenous Lasix and changed to oral Lasix as able watching for worsening chronic kidney disease 2. Anticoagulation for further risk reduction of deep venous thrombosis and pulmonary embolism exacerbating above 3. Chronic oxygen use with CPAP machine for sleep apnea likely causing majority of symptoms listed above 4. No further cardiac diagnostics necessary at this time 5. Discharge to acute care facility for further management of this due to patient inability to manage well at home and some noncompliance  Signed, Arnoldo Hooker M.D. FACC

## 2015-05-29 NOTE — Progress Notes (Signed)
ANTICOAGULATION CONSULT NOTE -Follow up  Consult  Pharmacy Consult for [place]  Indication: atrial fibrillation  Allergies  Allergen Reactions  . Enalapril Cough  . Atorvastatin Palpitations  . Lovastatin Palpitations    Patient Measurements: Height: 6' (182.9 cm) Weight: (!) 400 lb 5.7 oz (181.6 kg) IBW/kg (Calculated) : 77.6 Heparin Dosing Weight:   Vital Signs: Temp: 97.9 F (36.6 C) (09/07 0757) Temp Source: Oral (09/07 0757) BP: 101/59 mmHg (09/07 0757) Pulse Rate: 70 (09/07 0757)  Labs:  Recent Labs  05/27/15 1648 05/28/15 0539 05/29/15 0452  HGB 9.9* 10.0* 10.0*  HCT 30.7* 30.6* 30.2*  PLT 236 248 214  APTT  --  57*  --   LABPROT  --  31.2* 29.9*  INR  --  3.00 2.84  CREATININE 1.76* 1.66* 1.54*  TROPONINI <0.03  --   --     Estimated Creatinine Clearance: 87.1 mL/min (by C-G formula based on Cr of 1.54).   Medical History: Past Medical History  Diagnosis Date  . Cataract   . CHF (congestive heart failure)   . COPD (chronic obstructive pulmonary disease)   . Diabetes mellitus without complication   . Sleep apnea   . Oxygen deficiency   . Thyroid disease   . Gout   . Hypothyroidism   . CKD (chronic kidney disease) stage 3, GFR 30-59 ml/min   . Asthma   . Hypertension     Medications:  Scheduled:  . fluticasone  2 spray Each Nare Daily  . furosemide  80 mg Oral BID  . insulin aspart  0-5 Units Subcutaneous QHS  . insulin aspart  0-9 Units Subcutaneous TID WC  . levothyroxine  150 mcg Oral QAC breakfast  . PARoxetine  20 mg Oral Daily  . pravastatin  40 mg Oral QHS  . salmeterol  1 puff Inhalation Q12H  . sodium chloride  1 g Oral Daily  . spironolactone  50 mg Oral Daily  . tamsulosin  0.4 mg Oral Daily  . [START ON 05/31/2015] warfarin  10 mg Oral Once per day on Mon Fri  . warfarin  5 mg Oral Once per day on Sun Tue Wed Thu Sat  . Warfarin - Pharmacist Dosing Inpatient   Does not apply q1800    Assessment: Patient chronically on  warfarin for atrial fibrillation   INR= 2.84  Home dose of warfarin 10 mg PO Mon and Friday and 5 mg Tues, Wed, Thurs, Saturday, and Sunday.   Goal of Therapy:  INR 2-3 Monitor platelets by anticoagulation protocol: Yes   Plan:  Will give warfarin 5 mg PO today which is the patient's home dose on Wednesdays.   Mohab Ashby D 05/29/2015,8:08 AM

## 2015-05-30 LAB — GLUCOSE, CAPILLARY
Glucose-Capillary: 184 mg/dL — ABNORMAL HIGH (ref 65–99)
Glucose-Capillary: 226 mg/dL — ABNORMAL HIGH (ref 65–99)

## 2015-05-30 LAB — PROTIME-INR
INR: 2.59
PROTHROMBIN TIME: 27.9 s — AB (ref 11.4–15.0)

## 2015-05-30 LAB — BASIC METABOLIC PANEL
Anion gap: 9 (ref 5–15)
BUN: 49 mg/dL — ABNORMAL HIGH (ref 6–20)
CALCIUM: 8.8 mg/dL — AB (ref 8.9–10.3)
CO2: 30 mmol/L (ref 22–32)
CREATININE: 1.47 mg/dL — AB (ref 0.61–1.24)
Chloride: 92 mmol/L — ABNORMAL LOW (ref 101–111)
GFR, EST AFRICAN AMERICAN: 59 mL/min — AB (ref >60)
GFR, EST NON AFRICAN AMERICAN: 50 mL/min — AB (ref >60)
Glucose, Bld: 166 mg/dL — ABNORMAL HIGH (ref 65–99)
Potassium: 3.8 mmol/L (ref 3.5–5.1)
SODIUM: 131 mmol/L — AB (ref 135–145)

## 2015-05-30 LAB — CBC WITH DIFFERENTIAL/PLATELET
BASOS PCT: 1 %
Basophils Absolute: 0 10*3/uL (ref 0–0.1)
EOS ABS: 0.1 10*3/uL (ref 0–0.7)
Eosinophils Relative: 2 %
HCT: 29.5 % — ABNORMAL LOW (ref 40.0–52.0)
HEMOGLOBIN: 9.9 g/dL — AB (ref 13.0–18.0)
Lymphocytes Relative: 15 %
Lymphs Abs: 0.9 10*3/uL — ABNORMAL LOW (ref 1.0–3.6)
MCH: 26.6 pg (ref 26.0–34.0)
MCHC: 33.6 g/dL (ref 32.0–36.0)
MCV: 79.3 fL — ABNORMAL LOW (ref 80.0–100.0)
MONOS PCT: 11 %
Monocytes Absolute: 0.7 10*3/uL (ref 0.2–1.0)
NEUTROS PCT: 71 %
Neutro Abs: 4.2 10*3/uL (ref 1.4–6.5)
Platelets: 225 10*3/uL (ref 150–440)
RBC: 3.72 MIL/uL — ABNORMAL LOW (ref 4.40–5.90)
RDW: 17 % — AB (ref 11.5–14.5)
WBC: 5.9 10*3/uL (ref 3.8–10.6)

## 2015-05-30 MED ORDER — POLYETHYLENE GLYCOL 3350 17 G PO PACK
17.0000 g | PACK | Freq: Once | ORAL | Status: AC
  Administered 2015-05-30: 17 g via ORAL
  Filled 2015-05-30: qty 1

## 2015-05-30 MED ORDER — INSULIN ASPART 100 UNIT/ML ~~LOC~~ SOLN: 0.0000 [IU] | Freq: Three times a day (TID) | SUBCUTANEOUS | Status: DC

## 2015-05-30 MED ORDER — INSULIN ASPART 100 UNIT/ML ~~LOC~~ SOLN: 0.0000 [IU] | Freq: Every day | SUBCUTANEOUS | Status: DC

## 2015-05-30 MED ORDER — FLUTICASONE PROPIONATE 50 MCG/ACT NA SUSP: 2.0000 | Freq: Every day | NASAL | Status: DC

## 2015-05-30 MED ORDER — LEVOTHYROXINE SODIUM 150 MCG PO TABS: 150.0000 ug | ORAL_TABLET | Freq: Every day | ORAL | Status: DC

## 2015-05-30 MED ORDER — INSULIN NPH (HUMAN) (ISOPHANE) 100 UNIT/ML ~~LOC~~ SUSP: 50.0000 [IU] | Freq: Two times a day (BID) | SUBCUTANEOUS | Status: DC

## 2015-05-30 MED ORDER — SODIUM CHLORIDE 1 G PO TABS: 1.0000 g | ORAL_TABLET | Freq: Every day | ORAL | Status: DC

## 2015-05-30 MED ORDER — FUROSEMIDE 80 MG PO TABS: 80.0000 mg | ORAL_TABLET | Freq: Two times a day (BID) | ORAL | Status: DC

## 2015-05-30 MED ORDER — ALUM & MAG HYDROXIDE-SIMETH 200-200-20 MG/5ML PO SUSP: 30.0000 mL | ORAL | Status: AC | PRN

## 2015-05-30 MED ORDER — METOLAZONE 5 MG PO TABS: 5.0000 mg | ORAL_TABLET | ORAL | Status: DC

## 2015-05-30 NOTE — Consult Note (Signed)
   [facility] Inpatient Consult   05/30/2015  Tyler [name] 08-06-1956 [id number] Patient was active with [facility] Care Management.  Requested a [facility] Care Management social worker to follow up with discharge planning at the skilled nursing facility. Charlesetta Shanks, RN BSN CCM [facility]  [phone] business mobile phone

## 2015-05-30 NOTE — Progress Notes (Signed)
rn called report to [place] at [facility].pt transported to [facility] with wife  Via car

## 2015-05-30 NOTE — Progress Notes (Signed)
ANTICOAGULATION CONSULT NOTE -Follow up  Consult  Pharmacy Consult for [place]  Indication: atrial fibrillation  Allergies  Allergen Reactions  . Enalapril Cough  . Atorvastatin Palpitations  . Lovastatin Palpitations    Patient Measurements: Height: 6' (182.9 cm) Weight: (!) 382 lb 1.6 oz (173.319 kg) IBW/kg (Calculated) : 77.6 Heparin Dosing Weight:   Vital Signs: Temp: 97.7 F (36.5 C) (09/08 0751) Temp Source: Oral (09/08 0751) BP: 120/71 mmHg (09/08 0751) Pulse Rate: 61 (09/08 0751)  Labs:  Recent Labs  05/27/15 1648 05/28/15 0539 05/29/15 0452 05/30/15 0425  HGB 9.9* 10.0* 10.0* 9.9*  HCT 30.7* 30.6* 30.2* 29.5*  PLT 236 248 214 225  APTT  --  57*  --   --   LABPROT  --  31.2* 29.9* 27.9*  INR  --  3.00 2.84 2.59  CREATININE 1.76* 1.66* 1.54* 1.47*  TROPONINI <0.03  --   --   --     Estimated Creatinine Clearance: 88.7 mL/min (by C-G formula based on Cr of 1.47).   Medical History: Past Medical History  Diagnosis Date  . Cataract   . CHF (congestive heart failure)   . COPD (chronic obstructive pulmonary disease)   . Diabetes mellitus without complication   . Sleep apnea   . Oxygen deficiency   . Thyroid disease   . Gout   . Hypothyroidism   . CKD (chronic kidney disease) stage 3, GFR 30-59 ml/min   . Asthma   . Hypertension     Medications:  Scheduled:  . fluticasone  2 spray Each Nare Daily  . furosemide  80 mg Oral BID  . insulin aspart  0-5 Units Subcutaneous QHS  . insulin aspart  0-9 Units Subcutaneous TID WC  . levothyroxine  150 mcg Oral QAC breakfast  . PARoxetine  20 mg Oral Daily  . pravastatin  40 mg Oral QHS  . salmeterol  1 puff Inhalation Q12H  . sodium chloride  1 g Oral Daily  . spironolactone  50 mg Oral Daily  . tamsulosin  0.4 mg Oral Daily  . [START ON 05/31/2015] warfarin  10 mg Oral Once per day on Mon Fri  . warfarin  5 mg Oral Once per day on Sun Tue Wed Thu Sat  . Warfarin - Pharmacist Dosing Inpatient   Does  not apply q1800    Assessment: Patient chronically on warfarin for atrial fibrillation   Home dose of warfarin 10 mg PO Mon and Friday and 5 mg Tues, Wed, Thurs, Saturday, and Sunday.   INR therapeutic  Goal of Therapy:  INR 2-3 Monitor platelets by anticoagulation protocol: Yes   Plan:  Will continue home regimen as listed. Continue to follow INR with AM labs.    Garlon Hatchet, PharmD Clinical Pharmacist 05/30/2015 11:17 AM

## 2015-05-30 NOTE — Discharge Summary (Signed)
Tyler [name], is a 59 y.o. male  DOB 1955-11-16  MRN [id number].  Admission date:  05/27/2015  Admitting Physician  Oralia Manis, MD  Discharge Date:  05/30/2015   Primary MD  Isair Inabinet D, MD  Recommendations for primary care physician for things to follow:      Admission Diagnosis  Anasarca [R60.1] Hyponatremia [E87.1]   Discharge Diagnosis  Anasarca [R60.1] Hyponatremia [E87.1]  Morbid obesity, OSA, Pulmonary HTN, Chronic diastolic CHF  Principal Problem:   Acute on chronic diastolic congestive heart failure Active Problems:   Hyponatremia   A-fib   COPD, moderate   Depression, major, recurrent, moderate   Type 2 diabetes mellitus   Hypothyroidism   HLD (hyperlipidemia)      Past Medical History  Diagnosis Date  . Cataract   . CHF (congestive heart failure)   . COPD (chronic obstructive pulmonary disease)   . Diabetes mellitus without complication   . Sleep apnea   . Oxygen deficiency   . Thyroid disease   . Gout   . Hypothyroidism   . CKD (chronic kidney disease) stage 3, GFR 30-59 ml/min   . Asthma   . Hypertension     Past Surgical History  Procedure Laterality Date  . Cholecystectomy      2002  . Eye surgery      right cataract removed 3/21  . Vena cava filter placement         History of present illness and  Hospital Course:     Kindly see H&P for history of present illness and admission details, please review complete Labs, Consult reports and Test reports for all details in brief  HPI  from the history and physical done on the day of admission    Hospital Course   Pt admitted with A on C diastolic CHF and hyponatremia. Placed on IV LAsix. Volume status and sodium improved. Placed back on CPAP. Seen by Nephrology and Cardiology. SNF placement recommended. Pt agreeable.   Discharge Condition: stable   Follow UP  Follow-up Information    Follow up with Giuliano Preece D, MD In 1 week.   Specialty:  Internal Medicine   Contact information:   [address] [place] [address] [phone]         Discharge Instructions  and  Discharge Medications   See below  Discharge Instructions    For home use only DME continuous positive airway pressure (CPAP)    Complete by:  As directed   Patient has OSA or probable OSA:  Yes  Is the patient currently using CPAP in the home:  Yes  If yes (to question two):  Determine DME provider and inform them of any new orders/settings  Settings:  11-15  Signs and symptoms of probable OSA  (select all that apply):   Snoring Witnessed apneas    CPAP supplies needed:  Mask, headgear, cushions, filters, heated tubing and water chamber            Medication List  STOP taking these medications        insulin regular 100 units/mL injection  Commonly known as:  NOVOLIN R,HUMULIN R     torsemide 100 MG tablet  Commonly known as:  DEMADEX      TAKE these medications        acetaminophen 500 MG tablet  Commonly known as:  TYLENOL  Take 1,000 mg by mouth every 6 (six) hours as needed for mild pain or headache.     alum & mag hydroxide-simeth 200-200-20 MG/5ML suspension  Commonly known as:  MAALOX/MYLANTA  Take 30 mLs by mouth every 4 (four) hours as needed for indigestion or heartburn.     colchicine 0.6 MG tablet  Take 0.6 mg by mouth 2 (two) times daily as needed (for wrist pain).     cyanocobalamin 2000 MCG tablet  Take 2,000 mcg by mouth daily.     fluticasone 50 MCG/ACT nasal spray  Commonly known as:  FLONASE  Place 2 sprays into both nostrils daily.     furosemide 80 MG tablet  Commonly known as:  LASIX  Take 1 tablet (80 mg total) by mouth 2 (two) times daily.     gabapentin 100 MG capsule  Commonly known as:  NEURONTIN  Take 100 mg by mouth 2 (two) times daily.     insulin aspart 100 UNIT/ML injection  Commonly known as:  novoLOG  Inject 0-5 Units  into the skin at bedtime.     insulin aspart 100 UNIT/ML injection  Commonly known as:  novoLOG  Inject 0-9 Units into the skin 3 (three) times daily with meals.     insulin NPH Human 100 UNIT/ML injection  Commonly known as:  HUMULIN N,NOVOLIN N  Inject 0.5 mLs (50 Units total) into the skin 2 (two) times daily before a meal.     ipratropium-albuterol 0.5-2.5 (3) MG/3ML Soln  Commonly known as:  DUONEB  Take 3 mLs by nebulization every 4 (four) hours as needed.     lactulose 10 GM/15ML solution  Commonly known as:  CHRONULAC  Take 30 g by mouth 2 (two) times daily as needed for mild constipation.     levothyroxine 150 MCG tablet  Commonly known as:  SYNTHROID, LEVOTHROID  Take 1 tablet (150 mcg total) by mouth daily before breakfast.     metolazone 5 MG tablet  Commonly known as:  ZAROXOLYN  Take 1 tablet (5 mg total) by mouth 2 (two) times a week.     nystatin 100000 UNIT/GM Powd  Apply 1 g topically 2 (two) times daily as needed (for rash).     ondansetron 4 MG tablet  Commonly known as:  ZOFRAN  Take 1 tablet (4 mg total) by mouth every 6 (six) hours as needed for nausea.     PARoxetine 20 MG tablet  Commonly known as:  PAXIL  Take 20 mg by mouth daily.     polyethylene glycol packet  Commonly known as:  MIRALAX / GLYCOLAX  Take 17 g by mouth daily as needed for moderate constipation.     potassium chloride 10 MEQ tablet  Commonly known as:  K-DUR  Take 20 mEq by mouth daily.     pravastatin 40 MG tablet  Commonly known as:  PRAVACHOL  Take 40 mg by mouth at bedtime.     salmeterol 50 MCG/DOSE diskus inhaler  Commonly known as:  SEREVENT  Inhale 1 puff into the lungs every 12 (twelve) hours.     sodium chloride  1 G tablet  Take 1 tablet (1 g total) by mouth daily.     spironolactone 25 MG tablet  Commonly known as:  ALDACTONE  Take 50 mg by mouth daily.     tamsulosin 0.4 MG Caps capsule  Commonly known as:  FLOMAX  Take 1 capsule (0.4 mg total) by  mouth daily.     warfarin 10 MG tablet  Commonly known as:  COUMADIN  Take 5-10 mg by mouth every evening. Pt takes  on Tuesday, Wednesday, Thursday, Saturday, and Sunday.   Pt takes  on Monday and Friday.          Diet and Activity recommendation: See Discharge Instructions above   Consults obtained - Nephrology, Cardiology, PT   Major procedures and Radiology Reports - PLEASE review detailed and final reports for all details, in brief -   See below   Dg Chest 2 View  05/27/2015   CLINICAL DATA:  Weakness, intermittent running feeling in his chest. No pain.  EXAM: CHEST  2 VIEW  COMPARISON:  04/29/2015  FINDINGS: There is mild bilateral interstitial prominence with prominence of the central pulmonary vasculature. There is persistent chronic airspace disease in the posterior left lower lobe unchanged compared with the prior exam. There is no other focal consolidation, pleural effusion or pneumothorax. There is stable cardiomegaly.  The osseous structures are unremarkable.  IMPRESSION: 1. Stable CHF.   Electronically Signed   By: Elige Ko   On: 05/27/2015 18:37    Micro Results   See below  No results found for this or any previous visit (from the past 240 hour(s)).     Today   Subjective:   Tyler [name] today has no headache,no chest abdominal pain,no new weakness tingling or numbness. Agrees to SNF placement today.  Objective:   Blood pressure 101/51, pulse 74, temperature 98.1 F (36.7 C), temperature source Oral, resp. rate 18, height 6' (1.829 m), weight 173.319 kg (382 lb 1.6 oz), SpO2 98 %.   Intake/Output Summary (Last 24 hours) at 05/30/15 0729 Last data filed at 05/29/15 2318  Gross per 24 hour  Intake    720 ml  Output    350 ml  Net    370 ml    Exam Awake Alert, Oriented x 3, No new F.N deficits, Normal affect .AT,PERRAL Supple Neck,No JVD, No cervical lymphadenopathy appriciated.  Symmetrical Chest wall movement, Good air  movement bilaterally, basilar rales RRR,No Gallops,Rubs or new Murmurs, No Parasternal Heave +ve B.Sounds, Abd Soft, Non tender, No organomegaly appriciated, No rebound -guarding or rigidity. No Cyanosis, Clubbing. 3+ edema noted. No new Rash or bruise  Data Review   CBC w Diff: Lab Results  Component Value Date   WBC 5.9 05/30/2015   WBC 5.8 10/29/2014   HGB 9.9* 05/30/2015   HGB 12.9* 10/29/2014   HCT 29.5* 05/30/2015   HCT 40.0 10/29/2014   PLT 225 05/30/2015   PLT 221 10/29/2014   LYMPHOPCT 15 05/30/2015   LYMPHOPCT 19.5 10/29/2014   MONOPCT 11 05/30/2015   MONOPCT 8.5 10/29/2014   EOSPCT 2 05/30/2015   EOSPCT 2.2 10/29/2014   BASOPCT 1 05/30/2015   BASOPCT 1.1 10/29/2014    CMP: Lab Results  Component Value Date   NA 131* 05/30/2015   NA 136 08/21/2014   K 3.8 05/30/2015   K 3.7 08/21/2014   CL 92* 05/30/2015   CL 98 08/21/2014   CO2 30 05/30/2015   CO2 30 08/21/2014   BUN 49*  05/30/2015   BUN 25* 08/21/2014   CREATININE 1.47* 05/30/2015   CREATININE 1.73* 08/21/2014   PROT 6.8 05/27/2015   PROT 7.2 08/21/2014   ALBUMIN 2.8* 05/27/2015   ALBUMIN 2.8* 08/21/2014   BILITOT 1.1 05/27/2015   BILITOT 1.2* 08/21/2014   ALKPHOS 198* 05/27/2015   ALKPHOS 330* 08/21/2014   AST 27 05/27/2015   AST 25 08/21/2014   ALT 9* 05/27/2015   ALT 13* 08/21/2014  .   Total Time in preparing paper work, data evaluation and todays exam - 45 minutes  Maxene Byington D M.D on 05/30/2015 at 7:29 AM

## 2015-05-30 NOTE — Progress Notes (Signed)
[facility]  ROUNDING NOTE   Subjective:   Wife and son at bedside.  Patient breathing better this morning.   Objective:  Vital signs in last 24 hours:  Temp:  [97.7 F (36.5 C)-98.1 F (36.7 C)] 97.7 F (36.5 C) (09/08 0751) Pulse Rate:  [60-74] 61 (09/08 0751) Resp:  [18-19] 19 (09/08 0751) BP: (101-120)/(51-71) 120/71 mmHg (09/08 0751) SpO2:  [94 %-100 %] 94 % (09/08 0751) Weight:  [173.319 kg (382 lb 1.6 oz)] 173.319 kg (382 lb 1.6 oz) (09/08 0540)  Weight change: -8.281 kg (-18 lb 4.1 oz) Filed Weights   05/27/15 2251 05/29/15 0600 05/30/15 0540  Weight: 180.94 kg (398 lb 14.4 oz) 181.6 kg (400 lb 5.7 oz) 173.319 kg (382 lb 1.6 oz)    Intake/Output: I/O last 3 completed shifts: In: 1080 [P.O.:1080] Out: 350 [Urine:350]   Intake/Output this shift:  Total I/O In: 240 [P.O.:240] Out: 0   Physical Exam: General: NAD, obese, sitting on bed.   Head: Normocephalic, atraumatic. Moist oral mucosal membranes  Eyes: Anicteric, PERRL  Neck: Supple, trachea midline  Lungs:  Clear   Heart: irregular  Abdomen:  Soft, nontender, obese, abdominal wall edema +  Extremities: 2+ peripheral edema.  Neurologic: Nonfocal, moving all four extremities  Skin: No lesions       Basic Metabolic Panel:  Recent Labs Lab 05/27/15 1648 05/28/15 0539 05/29/15 0452 05/30/15 0425  NA 124* 126* 130* 131*  K 4.4 4.2 3.9 3.8  CL 85* 87* 90* 92*  CO2 27 28 30 30   GLUCOSE 189* 183* 152* 166*  BUN 50* 52* 49* 49*  CREATININE 1.76* 1.66* 1.54* 1.47*  CALCIUM 8.2* 8.6* 8.5* 8.8*    Liver Function Tests:  Recent Labs Lab 05/27/15 1658 05/27/15 2256  AST 28 27  ALT 9* 9*  ALKPHOS 175* 198*  BILITOT 0.8 1.1  PROT 6.5 6.8  ALBUMIN 2.7* 2.8*   No results for input(s): LIPASE, AMYLASE in the last 168 hours. No results for input(s): AMMONIA in the last 168 hours.  CBC:  Recent Labs Lab 05/27/15 1648 05/28/15 0539 05/29/15 0452 05/30/15 0425  WBC 6.0 6.0 5.7  5.9  NEUTROABS  --   --  4.0 4.2  HGB 9.9* 10.0* 10.0* 9.9*  HCT 30.7* 30.6* 30.2* 29.5*  MCV 79.3* 79.2* 80.2 79.3*  PLT 236 248 214 225    Cardiac Enzymes:  Recent Labs Lab 05/27/15 1648  TROPONINI <0.03    BNP: Invalid input(s): POCBNP  CBG:  Recent Labs Lab 05/28/15 2045 05/29/15 1146 05/29/15 1540 05/29/15 2025 05/30/15 0748  GLUCAP 209* 185* 167* 174* 184*    Microbiology: Results for orders placed or performed in visit on 08/21/14  ED Influenza     Status: None   Collection Time: 08/21/14  4:53 PM  Result Value Ref Range Status   Influenza A By PCR NEGATIVE NEGATIVE Final   Influenza B By PCR NEGATIVE NEGATIVE Final   H1N1 flu by pcr NOT DETECTED NOT-DETECTED Final    Comment:                  ----------------------- The Xpert Flu assay (FDA approval for nasal aspirates or washes and nasopharyngeal swab specimens), is intended as an aid in the diagnosis of influenza and should not be used as a sole basis for treatment.     Coagulation Studies:  Recent Labs  05/28/15 0539 05/29/15 0452 05/30/15 0425  LABPROT 31.2* 29.9* 27.9*  INR 3.00 2.84 2.59  Urinalysis: No results for input(s): COLORURINE, LABSPEC, PHURINE, GLUCOSEU, HGBUR, BILIRUBINUR, KETONESUR, PROTEINUR, UROBILINOGEN, NITRITE, LEUKOCYTESUR in the last 72 hours.  Invalid input(s): APPERANCEUR    Imaging: No results found.   Medications:     . fluticasone  2 spray Each Nare Daily  . furosemide  80 mg Oral BID  . insulin aspart  0-5 Units Subcutaneous QHS  . insulin aspart  0-9 Units Subcutaneous TID WC  . levothyroxine  150 mcg Oral QAC breakfast  . PARoxetine  20 mg Oral Daily  . polyethylene glycol  17 g Oral Once  . pravastatin  40 mg Oral QHS  . salmeterol  1 puff Inhalation Q12H  . sodium chloride  1 g Oral Daily  . spironolactone  50 mg Oral Daily  . tamsulosin  0.4 mg Oral Daily  . [START ON 05/31/2015] warfarin  10 mg Oral Once per day on Mon Fri  . warfarin  5  mg Oral Once per day on Sun Tue Wed Thu Sat  . Warfarin - Pharmacist Dosing Inpatient   Does not apply q1800   acetaminophen **OR** [DISCONTINUED] acetaminophen, alum & mag hydroxide-simeth, ondansetron **OR** [DISCONTINUED] ondansetron (ZOFRAN) IV  Assessment/ Plan:  [name] is a 59 y.o. white male with bilateral pulmonary emboli, DVTs, hypercoagulable disorder, varicose veins, morbid obesity, chronic kidney disease with baseline creatinine 1.5-1.8, chronic history failure, obstructive sleep apnea, diabetes type 2 with nephropathy, diabetic retinopathy, neuropathy, chronic atrial fibrillation, chronic lower back pain, diastolic congestive heart failure  1. Chronic Kidney Disease stage III: creatinine at baseline.  - Continue to monitor volume status, urine output and renal function - not currently on an ACE-I/ARB. Has allergy to enalapril - Renally dose all medications  2. Hyponatremia: improved to 131 - secondary to metolazone (thiazide type diuretic) and congestive heart failure - continue fluid restriction 1815mL/day - Continue furosemide.  - Paroxetine can cause SIADH, will monitor.   3. Acute exacerbation of congestive heart failure: diastolic dysfunction. EF 50-55% 02/16/15.  - furosemide and spironactone.  - appreciate cardiology input.   4. Diabetes Mellitus type II with renal manifestations: continue glucose control  5. Hypertension: hypotensive on admission. On tamsulosin and diuretics.      LOS: 3 Tyler [name] 9/8/201610:36 AM

## 2015-05-30 NOTE — Discharge Instructions (Signed)
SNF

## 2015-05-30 NOTE — Progress Notes (Signed)
Patient for d/c today to SNF bed at [facility], SNF auth # [id number]. Wife and patient agreeable to this plan- plan transfer via wife once CPAP settings clarified.   Reece Levy, MSW, Theresia Majors [phone]

## 2015-05-31 NOTE — Progress Notes (Signed)
This encounter was created in error - please disregard.

## 2015-05-31 NOTE — Patient Outreach (Signed)
[facility]) Care Management  05/31/2015  Tyler [name] 1955/11/03 [id number]   Referral from Charlesetta Shanks, RN to assign SW, assigned [employer], [place].  Thanks, Corrie Mckusick. Sharlee Blew [facility] Care Management [facility] CM Assistant Phone: [phone] Fax: [phone]

## 2015-06-04 ENCOUNTER — Other Ambulatory Visit: Payer: Self-pay | Admitting: *Deleted

## 2015-06-04 NOTE — Patient Outreach (Signed)
Called Dr. Judithann Sheen office, requested f/u (post hospitalization) appointment for pt  and was told pt already has a f/u appointment scheduled for 9/19 at 11am.   RN CM to relay to spouse today at f/u phone call about pt's f/u appointment.      Shayne Alken.   Murl Zogg RN CCM [facility] Care Management  [phone]

## 2015-06-04 NOTE — Patient Outreach (Signed)
Follow up phone call:  Spoke with pt, states spouse did not get home yet, picking up medications from [facility], call back again.     RN CM to call back, speak to spouse (pt's discharge instructions).  Shayne Alken.   Pierzchala RN CCM [facility] Care Management  [phone]

## 2015-06-04 NOTE — Patient Outreach (Signed)
Received a call from pt informing this RN CM that he is at home,  left  SNF 9/9 (went in on 9/8, left 9/9). Pt states could not eat the food, needed to get out, did not feel safe in SNF. Pt states he does not know if spouse got the  discharge papers from SNF.  Pt reports  no problems since being home does not eat large meals and has less problems with his stomach, drinks spring water cutting back on his ounces, eats no pork products- substituting for beef.  Pt states he feels better, has some swelling in feet/ankles/thighs/abdomine but not like before hospitalization- has support stockings on. Pt states weight today 367 lbs, upon going to SNF 9/8- 380 lbs.   Pt states he is to f/u with kidney MD 9/19 but no f/u  appointment with Dr. Judithann Sheen or Dr. Gwen Pounds.  Pt states he does not want to be told he needs to go back into a nursing home, would  rather do an outpatient, open to Home health but not from [facility].   Discussed with pt importance of f/u with Primary Care MD post discharge, requested if RN CM could call Dr. Judithann Sheen office for him, schedule f/u appointment as will need to see MD to order Home health to which pt agreed.  Also, as discussed with pt, RN  CM to call back later today to speak with spouse about discharge papers.   Plan to f/u with pt on 9/21 for  acute home visit as part of ongoing transition of care.     Shayne Alken.   Pierzchala RN CCM [facility] Care Management  [phone]

## 2015-06-04 NOTE — Patient Outreach (Signed)
F/u phone call: Spoke with spouse Sharon(on [facility] consent form) to review pt's discharge instructions (medications).  Spouse states she never received discharge instructions from SNF, they were mad because pt left 9/9. Spouse states pt's last medication list is from 8/22, with update from Dr. Gwen Pounds on 9/1 when MD decreased pt's Torsemide from 100 mg to 50 mg.  Spouse states she would like a copy of discharge instructions.   RN CM informed spouse pt has a scheduled f/u appointment with Dr. Judithann Sheen on 9/19 at 11am  to which spouse states pt has an appointment with Kidney MD 9/20.  Spouse states she cannot take off 2 days, to which RN CM encouraged her to call  Dr. Judithann Sheen office and try to reschedule appointment.   Spouse states she is going to call Dr. Judithann Sheen office to see if appointment can be on 9/20, same day as Kidney MD appointment.      Plan to f/u with pt on 9/21- acute home visit.    Shayne Alken.   Rye Decoste RN CCM [facility] Care Management  [phone]

## 2015-06-06 ENCOUNTER — Other Ambulatory Visit: Payer: Self-pay | Admitting: *Deleted

## 2015-06-06 NOTE — Patient Outreach (Signed)
[facility]) Care Management  06/06/2015  Tyler [name] Jul 16, 1956 [id number]   Follow up phone call to patient and spouse, Tyler [name] to  assess for social work needs following brief stay at [facility].  Patient remains adamant that he does not want to return to a skilled nursing facility, however is willing to have home health services. Patient also stated that he has been doing some exercises on his own.  Per patient's wife, he is doing better.  States that he has lost weight, weighing 365.6 pounds this morning.  Per patient's spouse, patient has all of his medications.  He does have some refills coming by way of [facility]  mail order.  Patient has a walker, wheelchair, shower chair and raised toilet seat. Patient's follow up appointments have been re-scheduled.  Tyler [name] states that his appointment with the kidney doctor is  06/10/15 at 4 pm and Dr. Judithann Sheen is scheduled for 9/21 or 9/22.  Per patient's spouse, she does have FMLA, however she tries to make patient's appointments after 2:30pm so she does not have to leave work.   Plan:  Patient has no social work needs at this time.  This Child psychotherapist will collaborate with [facility] regarding plan of care.   Adriana Reams [facility] Care Management [phone]

## 2015-06-10 ENCOUNTER — Other Ambulatory Visit: Payer: Self-pay | Admitting: *Deleted

## 2015-06-10 NOTE — Patient Outreach (Signed)
Transiton of care call #2 - Spoke with pt today.  He reports his weight is 367 stable and his FBS was 187. We discussed his self management of HF and he said, I don't know why you people keep talking to me about HF cause my heart has never stopped." I explained that heart failure is not that your heart stops, it just doesn't pump as adequately as it should and that is why you get SOB and edema. He says, "My cardiologist has never told me I have heart failure." Pt also complains of a large abdominal hernia that he feels needs to be reassessed as it is causing him some pain. He says no one has looked at this in a year. He would like it evalated and maybe get an abdominal binder to support it if this is appropriate. Pt described his diet which acutally sounds pretty good for his HF and Diabetes. I asked if he was avoiding salt and he told me yes BUT his sodium is low. I tried to explain this phenomenon to him but I don't feel like he accepted my explanation.  With pt mulitple ED visits and hospitalizations and now refusal to stay in SNF, this pt should be considered for pallative care consult. Will send Dr. Judithann Sheen this note.  Almetta Lovely [facility] Care Manager [phone]

## 2015-06-12 ENCOUNTER — Other Ambulatory Visit: Payer: Self-pay | Admitting: *Deleted

## 2015-06-12 ENCOUNTER — Ambulatory Visit: Payer: Commercial Managed Care - HMO | Admitting: *Deleted

## 2015-06-12 NOTE — Patient Outreach (Signed)
F/u phone call:  Spoke with pt to confirm home visit for  today as view in [facility] CSW note 9/15- spouse shared pt to f/u with Dr. Judithann Sheen 9/21 or 9/22).  Pt states he does have a MD appointment today.   As discussed, home visit rescheduled for 9/22.       Plan to f/u with pt 9/22- home visit (part of ongoing transition of care).    Shayne Alken.   Pierzchala RN CCM [facility] Care Management  [phone]

## 2015-06-13 ENCOUNTER — Other Ambulatory Visit: Payer: Self-pay | Admitting: *Deleted

## 2015-06-14 NOTE — Patient Outreach (Signed)
[facility]) Care Management   06/14/2015  Tyler [name] 17-Mar-1956 [id number]  Tyler [name] is an 59 y.o. male   Transition of care- Acute home visit   Subjective:  Pt reports feels better, on a hernia diet - limiting portions,multiple small meals.  Pt states he is having more natural food, no canned vegetables/fresh or frozen.  Pt states he would like someone to address his hernia.  Pt states he did not f/u with MD yesterday, cancelled and rescheduled for 9/26 (spouse to accompany).  Pt states weight today 363.8 lbs, BP 127/77, blood sugar 127.  Pt states he is only drinking water, spouse limiting to six 8 ounces a day.  Pt reports  f/u with Dr. Candiss Norse (kidney MD) 9/20, labs done- waiting to hear results.  Pt states he has not heard back on on sleep study.  Spouse states looking into getting pt a new pair of supportive stockings.    Objective:   Filed Vitals:   06/13/15 1642  BP: 110/70  Pulse: 61  Resp: 20    ROS  Physical Exam  Constitutional: He is oriented to person, place, and time. He appears well-developed and well-nourished.  Obese   Cardiovascular: Normal rate and regular rhythm.   Respiratory:  RUL anteriorly- lung sounds diminished   GI: Soft.  Musculoskeletal: He exhibits edema.  3-4+ edema in bilateral lower extremities from thighs down/top of feet.   Neurological: He is alert and oriented to person, place, and time.  Skin: Skin is warm and dry.  Psychiatric: He has a normal mood and affect. His behavior is normal. Judgment and thought content normal.    Current Medications:   Current Outpatient Prescriptions  Medication Sig Dispense Refill  . acetaminophen (TYLENOL) 500 MG tablet Take 1,000 mg by mouth every 6 (six) hours as needed for mild pain or headache.    Marland Kitchen alum & mag hydroxide-simeth (MAALOX/MYLANTA) 200-200-20 MG/5ML suspension Take 30 mLs by mouth every 4 (four) hours as needed for indigestion or heartburn. 355 mL 0  .  colchicine 0.6 MG tablet Take 0.6 mg by mouth 2 (two) times daily as needed (for wrist pain).    . cyanocobalamin 2000 MCG tablet Take 2,000 mcg by mouth daily.    . fluticasone (FLONASE) 50 MCG/ACT nasal spray Place 2 sprays into both nostrils daily. 16 g 2  . gabapentin (NEURONTIN) 100 MG capsule Take 100 mg by mouth 2 (two) times daily.    . insulin aspart (NOVOLOG) 100 UNIT/ML injection Inject 0-9 Units into the skin 3 (three) times daily with meals. 10 mL 11  . insulin NPH Human (HUMULIN N,NOVOLIN N) 100 UNIT/ML injection Inject 0.5 mLs (50 Units total) into the skin 2 (two) times daily before a meal. 10 mL 11  . ipratropium-albuterol (DUONEB) 0.5-2.5 (3) MG/3ML SOLN Take 3 mLs by nebulization every 4 (four) hours as needed. (Patient taking differently: Take 3 mLs by nebulization every 4 (four) hours as needed (for shortness of breath). ) 360 mL 5  . levothyroxine (SYNTHROID, LEVOTHROID) 150 MCG tablet Take 1 tablet (150 mcg total) by mouth daily before breakfast. 30 tablet 5  . metolazone (ZAROXOLYN) 5 MG tablet Take 1 tablet (5 mg total) by mouth 2 (two) times a week. 10 tablet 5  . nystatin (MYCOSTATIN/NYSTOP) 100000 UNIT/GM POWD Apply 1 g topically 2 (two) times daily as needed (for rash).     Marland Kitchen PARoxetine (PAXIL) 20 MG tablet Take 20 mg by mouth daily.    Marland Kitchen  polyethylene glycol (MIRALAX / GLYCOLAX) packet Take 17 g by mouth daily as needed for moderate constipation.     . potassium chloride (K-DUR) 10 MEQ tablet Take 20 mEq by mouth daily.    . pravastatin (PRAVACHOL) 40 MG tablet Take 40 mg by mouth at bedtime.    . salmeterol (SEREVENT) 50 MCG/DOSE diskus inhaler Inhale 1 puff into the lungs every 12 (twelve) hours. 1 Inhaler 12  . spironolactone (ALDACTONE) 25 MG tablet Take 50 mg by mouth daily.     . tamsulosin (FLOMAX) 0.4 MG CAPS capsule Take 1 capsule (0.4 mg total) by mouth daily. 30 capsule 5  . warfarin (COUMADIN) 10 MG tablet Take 5-10 mg by mouth every evening. Pt takes 34m on  Tuesday, Wednesday, Thursday, Saturday, and Sunday.   Pt takes 174mon Monday and Friday.    . furosemide (LASIX) 80 MG tablet Take 1 tablet (80 mg total) by mouth 2 (two) times daily. (Patient not taking: Reported on 06/13/2015) 60 tablet 5  . insulin aspart (NOVOLOG) 100 UNIT/ML injection Inject 0-5 Units into the skin at bedtime. (Patient not taking: Reported on 06/04/2015) 10 mL 11  . lactulose (CHRONULAC) 10 GM/15ML solution Take 30 g by mouth 2 (two) times daily as needed for mild constipation.     . ondansetron (ZOFRAN) 4 MG tablet Take 1 tablet (4 mg total) by mouth every 6 (six) hours as needed for nausea. (Patient not taking: Reported on 06/13/2015) 20 tablet 0  . sodium chloride 1 G tablet Take 1 tablet (1 g total) by mouth daily. (Patient not taking: Reported on 06/13/2015) 30 tablet 5   No current facility-administered medications for this visit.    Functional Status:   In your present state of health, do you have any difficulty performing the following activities: 05/27/2015 04/29/2015  Hearing? N N  Vision? N N  Difficulty concentrating or making decisions? N N  Walking or climbing stairs? Y Y  Dressing or bathing? Y Y  Doing errands, shopping? Y Y  Preparing Food and eating ? - -  Using the Toilet? - -  In the past six months, have you accidently leaked urine? - -  Do you have problems with loss of bowel control? - -  Managing your Medications? - -  Managing your Finances? - -  Housekeeping or managing your Housekeeping? - -    Fall/Depression Screening:    PHQ 2/9 Scores 01/22/2015 12/21/2014  PHQ - 2 Score - 1  Exception Documentation (No Data) -    Assessment:  HF - post discharge, pt weighing daily/recording. View of records shows weight  today 363.8 lbs, highest 371.2 lbs. No gain of 3 lbs in a day, 5 lbs in a week, progressively loosing.  No c/o sob except when overexerting.   Reports compliant with low na+ diet.                          DM:  View of recorded sugars- 127  today, 68-206.                          Sleep apnea- sleep study not done yet.   [facility] CM Care Plan Problem Two        Most Recent Value   Care Plan Problem Two  Risk for readmission related to recent hospitalization for anasarca/leaving SNF    Role Documenting the Problem Two  Care Management Coordinator  Care Plan for Problem Two  Active   Interventions for Problem Two Long Term Goal   Discussed with pt importance of weighing/recording, f/u with MD post discharge    [facility]N Long Term Goal (31-90) days  Pt would not be readmitted in the next 31 days    [facility]N Long Term Goal Start Date  06/04/15   [facility] CM Short Term Goal #1 (0-30 days)  Pt would be compliant with HF regiment in the next 30 days    [facility] CM Short Term Goal #1 Start Date  06/04/15   Interventions for Short Term Goal #2   Reviewed with pt weighing daily/compliance with Low Na+ diet, s/s to report to MD (increased edema)   [facility] CM Short Term Goal #2 (0-30 days)  pt would f/u with Primary Care MD within next week, review medicatiions    [facility] CM Short Term Goal #2 Start Date  06/04/15   [facility] CM Short Term Goal #2 Met Date  -- [not met- pt cancelled MD appt. rescheduled for 9/26]   Interventions for Short Term Goal #2  discussed with spouse importance of pt f/u with Primary Care MD post discharge- review medications   [facility] CM Short Term Goal #3 (0-30 days)  pt would f/u with PCP for post hospitalization visit within one week.    [facility] CM Short Term Goal #3 Start Date  06/13/15   Interventions for Short Term Goal #3  Discussed with pt/spouse importanc of f/u with MD post discharge, appt rescheduled for 9/26.        Plan:  Pt scheduled to f/u with Dr. [name] 9/26- to review medications, discuss hernia, inquire about sleep study                And HH PT.            Pt to continue to weigh daily/record, ongoing diet compliance.             Spouse to f/u on getting new support stockings, find out if need new rx (old one from January 2016).             Pt to continue to do home exercises (stretch band).             RN CM to f/u 9/29 telephonically as part of ongoing transition of care.    Zara Chess.   [place] Care Management  [phone]

## 2015-06-18 NOTE — Patient Outreach (Signed)
[facility]) Care Management  06/18/2015  Tyler [name] Apr 07, 1956 [id number]   Telephone call received from patient wife informing me they received the letter from social security regarding extra help, and patient has been approved for full subsidy. I am removing myself from the care team as there is no other assistance needed from me at this time.  Ervin Rothbauer L. Estill Llerena, AAS [facility] Care Management Assistant

## 2015-06-20 ENCOUNTER — Ambulatory Visit: Payer: Self-pay | Admitting: *Deleted

## 2015-06-24 ENCOUNTER — Other Ambulatory Visit: Payer: Self-pay | Admitting: *Deleted

## 2015-06-24 NOTE — Patient Outreach (Signed)
Late entry (Transition of care call -9/29):  Spoke with pt who reports f/u with Dr. Judithann Sheen 9/26, MD pleased, to keep it up.   Pt states he talked to MD about hernia, set up a visit with Dr. Katrinka Blazing, to change when he can pay the copay.  Pt states his weight yesterday was 358 lbs, 360 lbs today- ate heavy supper,more than he should at supper last night.  Pt reports got a letter from social security about extra help (approved).   Spoke with spouse Jasmine December who reported on pt's MD visit- Dr. Judithann Sheen did chest xray, results normal, to continue with Coumadin and f/u with MD 10/27, MD suppose to get appointment for sleep study. Spouse states pt was suppose to f/u with Dr. Katrinka Blazing about hernia 10/3, to call and reschedule, need to come up with copay.  Spouse states pt's legs are still swelling, ordered some support stockings on line. Spouse states pt saw Kendal Hymen (Dr. Philemon Kingdom NP) 9/28, to f/u with vascular MD 10/6.  Spouse states pt's blood sugars running 79-210.     Plan to f/u again telephonically 10/7 as part of ongoing transition of care call.      Shayne Alken.   Raul Winterhalter RN CCM [facility] Care Management  [phone]

## 2015-06-24 NOTE — Patient Outreach (Signed)
[facility]) Care Management  06/24/2015  Tyler [name] 1956/06/27 [id number]   Phone call to patient to follow up on financial needs in regards to his CPAP machine as well to assess for behavioral health needs. Message left for a return call.   Adriana Reams [facility] Care Management [phone]

## 2015-06-25 ENCOUNTER — Other Ambulatory Visit: Payer: Self-pay | Admitting: *Deleted

## 2015-06-25 NOTE — Patient Outreach (Signed)
[facility]) Care Management  06/25/2015  Tyler [name] 1955-10-08 [id number]   Phone call to patient to follow up on sleep study and  CPAP machine.  Per patient, he was trying to use his wife's CPAP machine.  Per patient, he was told by his doctor's that using the CPAP machine would help decrease the fluid from around his heart.    Phone call to Kennith Center, RN who stated that the sleep study was ordered on 05/13/15 and referral was sent to sleep med on 05/14/15 [phone] due to obesity, sleep apnea, swelling in his legs and pulmonary hypertension. Phone call to [facility] to follow up on referral.  Per Sleep [facility], they have the order and had scheduled the sleep study, however is was cancelled by patient.  They will just need to reschedule appointment.  Phone call to patient urging him to call sleep med to reschedule sleep study.  Contact information given.    Adriana Reams [facility] Care Management [phone]

## 2015-06-25 NOTE — Patient Outreach (Signed)
[facility]) Care Management  06/25/2015  TEL HEVIA 12/25/1955 [id number]   Phone call to patient and patient's wife to follow up on CPAP machine and financial needs.  Per paient's spouse, patient is doing much better, his mood has improved his weight is down.  Per patient's spouse, his blood sugar read 175, his weight is 350, his blood pressure in 122/78 and his heart rate is 84.  Patient has no heard back regarding his sleep study.  Per patient's wife, Dr. Judithann Sheen office has not yet sent the referral.  Patient has been approved for extra help.  Per patient's spouse, this automatically generates a Medicaid application.  Patient spouse is now working with Theodis Sato, from the Department of Social [facility] to complete application process.  In the meantime, patient has made payment arrangements for her co-pays to keep appointments scheduled with his heart doctor and vascular doctor.  Per patient's spouse, ot was suggested that patient receive a sleep study, however referral has not be generated yet.  Plan:  This Child psychotherapist will contact Dr. Judithann Sheen office to follow up on referral for sleep study.    Adriana Reams [facility] Care Management [phone]

## 2015-06-28 ENCOUNTER — Other Ambulatory Visit: Payer: Self-pay | Admitting: *Deleted

## 2015-06-28 ENCOUNTER — Other Ambulatory Visit: Payer: Commercial Managed Care - HMO | Admitting: *Deleted

## 2015-06-28 NOTE — Patient Outreach (Signed)
Transition of care call. Pt reports he did get his sleep study rescheduled. He gave phone to his wife and told me that the [facility] Case Manager is there now. She let me talk to her,  Eber Hong. She is visiting at present. She reports he has a red spot on his leg. He has taken a benadryl and have some relief with this. I told her I would follow up with him on Monday.  Almetta Lovely [facility] Care Manager [phone]

## 2015-07-01 ENCOUNTER — Inpatient Hospital Stay
Admission: EM | Admit: 2015-07-01 | Discharge: 2015-07-05 | DRG: 683 | Disposition: A | Discharge status: Home / Self Care | Payer: Commercial Managed Care - HMO | Attending: Internal Medicine | Admitting: Internal Medicine

## 2015-07-01 ENCOUNTER — Encounter: Payer: Self-pay | Admitting: Emergency Medicine

## 2015-07-01 ENCOUNTER — Other Ambulatory Visit: Payer: Self-pay | Admitting: *Deleted

## 2015-07-01 ENCOUNTER — Inpatient Hospital Stay: Payer: Commercial Managed Care - HMO

## 2015-07-01 DIAGNOSIS — E11621 Type 2 diabetes mellitus with foot ulcer: Secondary | ICD-10-CM | POA: Diagnosis present

## 2015-07-01 DIAGNOSIS — E875 Hyperkalemia: Secondary | ICD-10-CM | POA: Diagnosis present

## 2015-07-01 DIAGNOSIS — L03116 Cellulitis of left lower limb: Secondary | ICD-10-CM | POA: Diagnosis present

## 2015-07-01 DIAGNOSIS — N189 Chronic kidney disease, unspecified: Secondary | ICD-10-CM

## 2015-07-01 DIAGNOSIS — I482 Chronic atrial fibrillation: Secondary | ICD-10-CM | POA: Diagnosis present

## 2015-07-01 DIAGNOSIS — Z6841 Body Mass Index (BMI) 40.0 and over, adult: Secondary | ICD-10-CM

## 2015-07-01 DIAGNOSIS — I872 Venous insufficiency (chronic) (peripheral): Secondary | ICD-10-CM | POA: Diagnosis present

## 2015-07-01 DIAGNOSIS — F17210 Nicotine dependence, cigarettes, uncomplicated: Secondary | ICD-10-CM | POA: Diagnosis present

## 2015-07-01 DIAGNOSIS — Z888 Allergy status to other drugs, medicaments and biological substances status: Secondary | ICD-10-CM | POA: Diagnosis not present

## 2015-07-01 DIAGNOSIS — E871 Hypo-osmolality and hyponatremia: Secondary | ICD-10-CM | POA: Diagnosis present

## 2015-07-01 DIAGNOSIS — L8990 Pressure ulcer of unspecified site, unspecified stage: Secondary | ICD-10-CM | POA: Insufficient documentation

## 2015-07-01 DIAGNOSIS — R609 Edema, unspecified: Secondary | ICD-10-CM

## 2015-07-01 DIAGNOSIS — H269 Unspecified cataract: Secondary | ICD-10-CM | POA: Diagnosis present

## 2015-07-01 DIAGNOSIS — E039 Hypothyroidism, unspecified: Secondary | ICD-10-CM | POA: Diagnosis present

## 2015-07-01 DIAGNOSIS — Z86718 Personal history of other venous thrombosis and embolism: Secondary | ICD-10-CM | POA: Diagnosis not present

## 2015-07-01 DIAGNOSIS — E785 Hyperlipidemia, unspecified: Secondary | ICD-10-CM | POA: Diagnosis present

## 2015-07-01 DIAGNOSIS — T502X5A Adverse effect of carbonic-anhydrase inhibitors, benzothiadiazides and other diuretics, initial encounter: Secondary | ICD-10-CM | POA: Diagnosis present

## 2015-07-01 DIAGNOSIS — J449 Chronic obstructive pulmonary disease, unspecified: Secondary | ICD-10-CM | POA: Diagnosis present

## 2015-07-01 DIAGNOSIS — E1122 Type 2 diabetes mellitus with diabetic chronic kidney disease: Secondary | ICD-10-CM | POA: Diagnosis present

## 2015-07-01 DIAGNOSIS — E079 Disorder of thyroid, unspecified: Secondary | ICD-10-CM | POA: Diagnosis present

## 2015-07-01 DIAGNOSIS — M545 Low back pain: Secondary | ICD-10-CM | POA: Diagnosis present

## 2015-07-01 DIAGNOSIS — G8929 Other chronic pain: Secondary | ICD-10-CM | POA: Diagnosis present

## 2015-07-01 DIAGNOSIS — J45909 Unspecified asthma, uncomplicated: Secondary | ICD-10-CM | POA: Diagnosis present

## 2015-07-01 DIAGNOSIS — G4733 Obstructive sleep apnea (adult) (pediatric): Secondary | ICD-10-CM | POA: Diagnosis present

## 2015-07-01 DIAGNOSIS — E11319 Type 2 diabetes mellitus with unspecified diabetic retinopathy without macular edema: Secondary | ICD-10-CM | POA: Diagnosis present

## 2015-07-01 DIAGNOSIS — I8390 Asymptomatic varicose veins of unspecified lower extremity: Secondary | ICD-10-CM | POA: Diagnosis present

## 2015-07-01 DIAGNOSIS — D631 Anemia in chronic kidney disease: Secondary | ICD-10-CM | POA: Diagnosis present

## 2015-07-01 DIAGNOSIS — I5032 Chronic diastolic (congestive) heart failure: Secondary | ICD-10-CM | POA: Diagnosis present

## 2015-07-01 DIAGNOSIS — N183 Chronic kidney disease, stage 3 (moderate): Secondary | ICD-10-CM | POA: Diagnosis present

## 2015-07-01 DIAGNOSIS — L97509 Non-pressure chronic ulcer of other part of unspecified foot with unspecified severity: Secondary | ICD-10-CM | POA: Diagnosis present

## 2015-07-01 DIAGNOSIS — E1121 Type 2 diabetes mellitus with diabetic nephropathy: Secondary | ICD-10-CM | POA: Diagnosis present

## 2015-07-01 DIAGNOSIS — Z794 Long term (current) use of insulin: Secondary | ICD-10-CM | POA: Diagnosis not present

## 2015-07-01 DIAGNOSIS — I130 Hypertensive heart and chronic kidney disease with heart failure and stage 1 through stage 4 chronic kidney disease, or unspecified chronic kidney disease: Secondary | ICD-10-CM | POA: Diagnosis present

## 2015-07-01 DIAGNOSIS — L0390 Cellulitis, unspecified: Secondary | ICD-10-CM | POA: Diagnosis present

## 2015-07-01 DIAGNOSIS — E1140 Type 2 diabetes mellitus with diabetic neuropathy, unspecified: Secondary | ICD-10-CM | POA: Diagnosis present

## 2015-07-01 DIAGNOSIS — Z79899 Other long term (current) drug therapy: Secondary | ICD-10-CM | POA: Diagnosis not present

## 2015-07-01 DIAGNOSIS — M109 Gout, unspecified: Secondary | ICD-10-CM | POA: Diagnosis present

## 2015-07-01 DIAGNOSIS — Z7901 Long term (current) use of anticoagulants: Secondary | ICD-10-CM

## 2015-07-01 DIAGNOSIS — N179 Acute kidney failure, unspecified: Secondary | ICD-10-CM | POA: Diagnosis present

## 2015-07-01 LAB — COMPREHENSIVE METABOLIC PANEL
ALBUMIN: 2.7 g/dL — AB (ref 3.5–5.0)
ALK PHOS: 365 U/L — AB (ref 38–126)
ALT: 12 U/L — AB (ref 17–63)
ANION GAP: 13 (ref 5–15)
AST: 24 U/L (ref 15–41)
BUN: 57 mg/dL — ABNORMAL HIGH (ref 6–20)
CALCIUM: 9 mg/dL (ref 8.9–10.3)
CHLORIDE: 96 mmol/L — AB (ref 101–111)
CO2: 22 mmol/L (ref 22–32)
CREATININE: 2.66 mg/dL — AB (ref 0.61–1.24)
GFR calc Af Amer: 29 mL/min — ABNORMAL LOW (ref >60)
GFR calc non Af Amer: 25 mL/min — ABNORMAL LOW (ref >60)
GLUCOSE: 167 mg/dL — AB (ref 65–99)
Potassium: 5.6 mmol/L — ABNORMAL HIGH (ref 3.5–5.1)
SODIUM: 131 mmol/L — AB (ref 135–145)
Total Bilirubin: 1.3 mg/dL — ABNORMAL HIGH (ref 0.3–1.2)
Total Protein: 7.8 g/dL (ref 6.5–8.1)

## 2015-07-01 LAB — PROTIME-INR
INR: 2.99
PROTHROMBIN TIME: 31.1 s — AB (ref 11.4–15.0)

## 2015-07-01 LAB — CBC WITH DIFFERENTIAL/PLATELET
BASOS ABS: 0.1 10*3/uL (ref 0–0.1)
BASOS PCT: 0 %
EOS ABS: 0.1 10*3/uL (ref 0–0.7)
EOS PCT: 0 %
HCT: 34.5 % — ABNORMAL LOW (ref 40.0–52.0)
HEMOGLOBIN: 11 g/dL — AB (ref 13.0–18.0)
Lymphocytes Relative: 4 %
Lymphs Abs: 0.8 10*3/uL — ABNORMAL LOW (ref 1.0–3.6)
MCH: 25.2 pg — ABNORMAL LOW (ref 26.0–34.0)
MCHC: 31.8 g/dL — ABNORMAL LOW (ref 32.0–36.0)
MCV: 79.5 fL — ABNORMAL LOW (ref 80.0–100.0)
Monocytes Absolute: 0.8 10*3/uL (ref 0.2–1.0)
Monocytes Relative: 4 %
NEUTROS PCT: 92 %
Neutro Abs: 17.4 10*3/uL — ABNORMAL HIGH (ref 1.4–6.5)
PLATELETS: 256 10*3/uL (ref 150–440)
RBC: 4.34 MIL/uL — AB (ref 4.40–5.90)
RDW: 18.4 % — ABNORMAL HIGH (ref 11.5–14.5)
WBC: 19.1 10*3/uL — AB (ref 3.8–10.6)

## 2015-07-01 LAB — GLUCOSE, CAPILLARY: GLUCOSE-CAPILLARY: 161 mg/dL — AB (ref 65–99)

## 2015-07-01 LAB — LACTIC ACID, PLASMA: LACTIC ACID, VENOUS: 1.5 mmol/L (ref 0.5–2.0)

## 2015-07-01 LAB — TROPONIN I: Troponin I: 0.04 ng/mL — ABNORMAL HIGH (ref <0.031)

## 2015-07-01 MED ORDER — WARFARIN SODIUM 5 MG PO TABS: 10.0000 mg | ORAL_TABLET | ORAL | Status: DC

## 2015-07-01 MED ORDER — ACETAMINOPHEN 500 MG PO TABS
1000.0000 mg | ORAL_TABLET | Freq: Four times a day (QID) | ORAL | Status: DC | PRN
  Administered 2015-07-01: 1000 mg via ORAL
  Filled 2015-07-01: qty 2

## 2015-07-01 MED ORDER — PRAVASTATIN SODIUM 20 MG PO TABS
40.0000 mg | ORAL_TABLET | Freq: Every day | ORAL | Status: DC
  Administered 2015-07-01 – 2015-07-04 (×4): 40 mg via ORAL
  Filled 2015-07-01 (×4): qty 2

## 2015-07-01 MED ORDER — HEPARIN SODIUM (PORCINE) 5000 UNIT/ML IJ SOLN: 5000.0000 [IU] | Freq: Three times a day (TID) | INTRAMUSCULAR | Status: DC

## 2015-07-01 MED ORDER — GABAPENTIN 100 MG PO CAPS
100.0000 mg | ORAL_CAPSULE | Freq: Two times a day (BID) | ORAL | Status: DC
  Administered 2015-07-01 – 2015-07-05 (×8): 100 mg via ORAL
  Filled 2015-07-01 (×8): qty 1

## 2015-07-01 MED ORDER — TAMSULOSIN HCL 0.4 MG PO CAPS
0.4000 mg | ORAL_CAPSULE | Freq: Every day | ORAL | Status: DC
  Administered 2015-07-02 – 2015-07-05 (×4): 0.4 mg via ORAL
  Filled 2015-07-01 (×4): qty 1

## 2015-07-01 MED ORDER — VITAMIN B-12 1000 MCG PO TABS
2000.0000 ug | ORAL_TABLET | Freq: Every day | ORAL | Status: DC
  Administered 2015-07-02 – 2015-07-05 (×4): 2000 ug via ORAL
  Filled 2015-07-01 (×4): qty 2

## 2015-07-01 MED ORDER — LACTULOSE 10 GM/15ML PO SOLN: 20.0000 g | Freq: Two times a day (BID) | ORAL | Status: DC | PRN

## 2015-07-01 MED ORDER — SODIUM POLYSTYRENE SULFONATE 15 GM/60ML PO SUSP
30.0000 g | Freq: Once | ORAL | Status: AC
  Administered 2015-07-01: 30 g via ORAL
  Filled 2015-07-01: qty 120

## 2015-07-01 MED ORDER — WARFARIN SODIUM 5 MG PO TABS: 5.0000 mg | ORAL_TABLET | ORAL | Status: DC

## 2015-07-01 MED ORDER — FLUTICASONE PROPIONATE 50 MCG/ACT NA SUSP: 2.0000 | Freq: Every day | NASAL | Status: DC | PRN

## 2015-07-01 MED ORDER — INFLUENZA VAC SPLIT QUAD 0.5 ML IM SUSY
0.5000 mL | PREFILLED_SYRINGE | INTRAMUSCULAR | Status: AC
  Administered 2015-07-02: 0.5 mL via INTRAMUSCULAR
  Filled 2015-07-01: qty 0.5

## 2015-07-01 MED ORDER — ONDANSETRON HCL 4 MG PO TABS: 4.0000 mg | ORAL_TABLET | Freq: Four times a day (QID) | ORAL | Status: DC | PRN

## 2015-07-01 MED ORDER — LEVOTHYROXINE SODIUM 100 MCG PO TABS
100.0000 ug | ORAL_TABLET | Freq: Every day | ORAL | Status: DC
  Administered 2015-07-02 – 2015-07-05 (×4): 100 ug via ORAL
  Filled 2015-07-01 (×4): qty 1

## 2015-07-01 MED ORDER — POLYETHYLENE GLYCOL 3350 17 G PO PACK: 17.0000 g | PACK | Freq: Every day | ORAL | Status: DC | PRN

## 2015-07-01 MED ORDER — ALUM & MAG HYDROXIDE-SIMETH 200-200-20 MG/5ML PO SUSP: 30.0000 mL | ORAL | Status: DC | PRN

## 2015-07-01 MED ORDER — CYANOCOBALAMIN 1000 MCG/ML IJ SOLN
1000.0000 ug | INTRAMUSCULAR | Status: DC
  Administered 2015-07-01: 1000 ug via INTRAMUSCULAR
  Filled 2015-07-01: qty 1

## 2015-07-01 MED ORDER — INSULIN ASPART 100 UNIT/ML ~~LOC~~ SOLN
0.0000 [IU] | Freq: Three times a day (TID) | SUBCUTANEOUS | Status: DC
  Administered 2015-07-02 – 2015-07-04 (×9): 2 [IU] via SUBCUTANEOUS
  Filled 2015-07-01 (×9): qty 2

## 2015-07-01 MED ORDER — SALMETEROL XINAFOATE 50 MCG/DOSE IN AEPB: 1.0000 | INHALATION_SPRAY | Freq: Two times a day (BID) | RESPIRATORY_TRACT | Status: DC | PRN

## 2015-07-01 MED ORDER — ACETAMINOPHEN 500 MG PO TABS
ORAL_TABLET | ORAL | Status: AC
  Filled 2015-07-01: qty 2

## 2015-07-01 MED ORDER — WARFARIN - PHARMACIST DOSING INPATIENT: Freq: Every day | Status: DC

## 2015-07-01 MED ORDER — PNEUMOCOCCAL VAC POLYVALENT 25 MCG/0.5ML IJ INJ: 0.5000 mL | INJECTION | INTRAMUSCULAR | Status: DC

## 2015-07-01 MED ORDER — IPRATROPIUM-ALBUTEROL 0.5-2.5 (3) MG/3ML IN SOLN: 3.0000 mL | RESPIRATORY_TRACT | Status: DC | PRN

## 2015-07-01 MED ORDER — SODIUM CHLORIDE 0.9 % IV SOLN
INTRAVENOUS | Status: AC
Start: 2015-07-01 — End: 2015-07-02
  Administered 2015-07-01 – 2015-07-02 (×2): via INTRAVENOUS

## 2015-07-01 MED ORDER — SODIUM CHLORIDE 0.9 % IJ SOLN
3.0000 mL | Freq: Two times a day (BID) | INTRAMUSCULAR | Status: DC
  Administered 2015-07-01 – 2015-07-02 (×2): 3 mL via INTRAVENOUS

## 2015-07-01 MED ORDER — HYDROXYZINE HCL 25 MG PO TABS: 25.0000 mg | ORAL_TABLET | Freq: Three times a day (TID) | ORAL | Status: DC | PRN

## 2015-07-01 MED ORDER — SODIUM CHLORIDE 0.9 % IV BOLUS (SEPSIS)
500.0000 mL | Freq: Once | INTRAVENOUS | Status: AC
Start: 2015-07-01 — End: 2015-07-01
  Administered 2015-07-01: 500 mL via INTRAVENOUS

## 2015-07-01 MED ORDER — WARFARIN SODIUM 5 MG PO TABS: 5.0000 mg | ORAL_TABLET | Freq: Every evening | ORAL | Status: DC

## 2015-07-01 MED ORDER — VANCOMYCIN HCL IN DEXTROSE 1-5 GM/200ML-% IV SOLN
1000.0000 mg | Freq: Once | INTRAVENOUS | Status: AC
  Administered 2015-07-01: 1000 mg via INTRAVENOUS
  Filled 2015-07-01: qty 200

## 2015-07-01 MED ORDER — PAROXETINE HCL 20 MG PO TABS
20.0000 mg | ORAL_TABLET | Freq: Every day | ORAL | Status: DC
  Administered 2015-07-02 – 2015-07-05 (×4): 20 mg via ORAL
  Filled 2015-07-01 (×4): qty 1

## 2015-07-01 MED ORDER — SODIUM CHLORIDE 0.9 % IV SOLN
3.0000 g | Freq: Four times a day (QID) | INTRAVENOUS | Status: DC
  Administered 2015-07-01 – 2015-07-05 (×14): 3 g via INTRAVENOUS
  Filled 2015-07-01 (×16): qty 3

## 2015-07-01 NOTE — H&P (Signed)
[facility] - [place] at [facility]   PATIENT NAME: Tyler [name]    MR#:  [id number]  DATE OF BIRTH:  1956-02-21  DATE OF ADMISSION:  07/01/2015  PRIMARY CARE PHYSICIAN: Marguarite Arbour, MD   REQUESTING/REFERRING PHYSICIAN: Huel Cote  CHIEF COMPLAINT:   Chief Complaint  Patient presents with  . Leg Swelling    bilateral leg swelling and redness x 3 days    HISTORY OF PRESENT ILLNESS: Tyler [name]  is a 59 y.o. male with a known history of CHF, COPD, diabetes, sleep apnea, thyroid disease, hypothyroidism, chronic kidney disease stage III, hypertension- lives at home with wife and able to walk with sometimes walker but generally without any support. He noticed he had some pain and more swelling on the right lower extremity he called Dr. Judithann Sheen on Friday and he prescribed Benadryl tablet. Over the weekend he started noticing his left leg is getting more and more swollen and painful and red but he denies any fevers chills or sweating. Concerned with this he came to emergency room today and he was found to have cellulitis on left lower extremity. On blood work in ER he was found to have worsening in his renal function and his potassium level was high. Hospitalist team for further management of both these issues.  PAST MEDICAL HISTORY:   Past Medical History  Diagnosis Date  . Cataract   . CHF (congestive heart failure) (HCC)   . COPD (chronic obstructive pulmonary disease) (HCC)   . Diabetes mellitus without complication (HCC)   . Sleep apnea   . Oxygen deficiency   . Thyroid disease   . Gout   . Hypothyroidism   . CKD (chronic kidney disease) stage 3, GFR 30-59 ml/min   . Asthma   . Hypertension     PAST SURGICAL HISTORY:  Past Surgical History  Procedure Laterality Date  . Cholecystectomy      2002  . Eye surgery      right cataract removed 3/21  . Vena cava filter placement      SOCIAL HISTORY:  Social History  Substance Use Topics   . Smoking status: Current Some Day Smoker -- 0.00 packs/day for 30 years    Types: Cigars  . Smokeless tobacco: Never Used     Comment: cigar 3-4 yrs 1-3 per day/former Cigarettes  . Alcohol Use: No    FAMILY HISTORY:  Family History  Problem Relation Age of Onset  . Diabetes Sister   . Diabetes Brother   . Deep vein thrombosis Mother   . CVA Father     DRUG ALLERGIES:  Allergies  Allergen Reactions  . Enalapril Cough  . Atorvastatin Palpitations  . Lovastatin Palpitations    REVIEW OF SYSTEMS:   CONSTITUTIONAL: No fever, fatigue or weakness.  EYES: No blurred or double vision.  EARS, NOSE, AND THROAT: No tinnitus or ear pain.  RESPIRATORY: No cough, shortness of breath, wheezing or hemoptysis.  CARDIOVASCULAR: No chest pain, orthopnea, edema.  GASTROINTESTINAL: No nausea, vomiting, diarrhea or abdominal pain.  GENITOURINARY: No dysuria, hematuria.  ENDOCRINE: No polyuria, nocturia,  HEMATOLOGY: No anemia, easy bruising or bleeding SKIN: No rash or lesion. MUSCULOSKELETAL: No joint pain or arthritis.  Have pain and swelling in left leg. NEUROLOGIC: No tingling, numbness, weakness.  PSYCHIATRY: No anxiety or depression.   MEDICATIONS AT HOME:  Prior to Admission medications   Medication Sig Start Date End Date Taking? Authorizing Provider  acetaminophen (TYLENOL) 500 MG tablet Take 1,000 mg  by mouth every 6 (six) hours as needed for mild pain or headache.   Yes Historical Provider, MD  alum & mag hydroxide-simeth (MAALOX/MYLANTA) 200-200-20 MG/5ML suspension Take 30 mLs by mouth every 4 (four) hours as needed for indigestion or heartburn. 05/30/15  Yes Marguarite Arbour, MD  colchicine 0.6 MG tablet Take 0.6 mg by mouth as needed (for gout flares).    Yes Historical Provider, MD  cyanocobalamin (,VITAMIN B-12,) 1000 MCG/ML injection Inject 1,000 mcg into the muscle every 30 (thirty) days.   Yes Historical Provider, MD  cyanocobalamin 2000 MCG tablet Take 2,000 mcg by  mouth daily.   Yes Historical Provider, MD  fluticasone (FLONASE) 50 MCG/ACT nasal spray Place 2 sprays into both nostrils daily as needed for rhinitis.   Yes Historical Provider, MD  gabapentin (NEURONTIN) 100 MG capsule Take 100 mg by mouth 2 (two) times daily.   Yes Historical Provider, MD  hydrOXYzine (ATARAX/VISTARIL) 25 MG tablet Take 25-50 mg by mouth 3 (three) times daily as needed for itching.   Yes Historical Provider, MD  insulin NPH Human (HUMULIN N,NOVOLIN N) 100 UNIT/ML injection Inject into the skin as needed (for high blood sugar). Pt states that he uses per sliding scale.   Yes Historical Provider, MD  insulin regular (NOVOLIN R,HUMULIN R) 100 units/mL injection Inject into the skin as needed for high blood sugar. Pt states that he uses per sliding scale.   Yes Historical Provider, MD  ipratropium-albuterol (DUONEB) 0.5-2.5 (3) MG/3ML SOLN Take 3 mLs by nebulization every 4 (four) hours as needed. Patient taking differently: Take 3 mLs by nebulization every 4 (four) hours as needed (for shortness of breath).  04/19/15  Yes Marguarite Arbour, MD  lactulose (CHRONULAC) 10 GM/15ML solution Take 20 g by mouth 2 (two) times daily as needed for mild constipation.    Yes Historical Provider, MD  levothyroxine (SYNTHROID, LEVOTHROID) 100 MCG tablet Take 100 mcg by mouth daily.   Yes Historical Provider, MD  metolazone (ZAROXOLYN) 5 MG tablet Take 5 mg by mouth daily.   Yes Historical Provider, MD  ondansetron (ZOFRAN) 4 MG tablet Take 1 tablet (4 mg total) by mouth every 6 (six) hours as needed for nausea. 04/19/15  Yes Marguarite Arbour, MD  PARoxetine (PAXIL) 20 MG tablet Take 20 mg by mouth daily.   Yes Historical Provider, MD  polyethylene glycol (MIRALAX / GLYCOLAX) packet Take 17 g by mouth daily as needed for mild constipation.    Yes Historical Provider, MD  potassium chloride SA (K-DUR,KLOR-CON) 20 MEQ tablet Take 20 mEq by mouth daily.   Yes Historical Provider, MD  pravastatin  (PRAVACHOL) 40 MG tablet Take 40 mg by mouth at bedtime.   Yes Historical Provider, MD  salmeterol (SEREVENT) 50 MCG/DOSE diskus inhaler Inhale 1 puff into the lungs 2 (two) times daily as needed (for shortness of breath).   Yes Historical Provider, MD  spironolactone (ALDACTONE) 25 MG tablet Take 25 mg by mouth 2 (two) times daily.   Yes Historical Provider, MD  tamsulosin (FLOMAX) 0.4 MG CAPS capsule Take 1 capsule (0.4 mg total) by mouth daily. 04/19/15  Yes Marguarite Arbour, MD  torsemide (DEMADEX) 100 MG tablet Take 50 mg by mouth daily.   Yes Historical Provider, MD  warfarin (COUMADIN) 10 MG tablet Take 5-10 mg by mouth every evening. Pt takes 5mg  on Tuesday, Wednesday, Thursday, Saturday, and Sunday.   Pt takes 10mg  on Monday and Friday.   Yes Historical Provider, MD  furosemide (LASIX) 80 MG tablet Take 1 tablet (80 mg total) by mouth 2 (two) times daily. Patient not taking: Reported on 06/13/2015 05/30/15   Marguarite Arbour, MD  insulin aspart (NOVOLOG) 100 UNIT/ML injection Inject 0-5 Units into the skin at bedtime. Patient not taking: Reported on 06/04/2015 05/30/15   Marguarite Arbour, MD  insulin aspart (NOVOLOG) 100 UNIT/ML injection Inject 0-9 Units into the skin 3 (three) times daily with meals. Patient not taking: Reported on 07/01/2015 05/30/15   Marguarite Arbour, MD  sodium chloride 1 G tablet Take 1 tablet (1 g total) by mouth daily. Patient not taking: Reported on 06/13/2015 05/30/15   Marguarite Arbour, MD      PHYSICAL EXAMINATION:   VITAL SIGNS: Blood pressure 125/76, pulse 73, temperature 98.3 F (36.8 C), temperature source Oral, resp. rate 21, height 6' (1.829 m), weight 162.841 kg (359 lb), SpO2 93 %.  GENERAL:  59 y.o.-year-old patient lying in the bed with no acute distress.  EYES: Pupils equal, round, reactive to light and accommodation. No scleral icterus. Extraocular muscles intact.  HEENT: Head atraumatic, normocephalic. Oropharynx and nasopharynx clear.  NECK:  Supple,  no jugular venous distention. No thyroid enlargement, no tenderness.  LUNGS: Normal breath sounds bilaterally, no wheezing, rales,rhonchi or crepitation. No use of accessory muscles of respiration.  CARDIOVASCULAR: S1, S2 normal. No murmurs, rubs, or gallops.  ABDOMEN: Soft, nontender, nondistended. Bowel sounds present. No organomegaly or mass.  EXTREMITIES:positive for pedal edema, and redness on left leg.  NEUROLOGIC: Cranial nerves II through XII are intact. Muscle strength 5/5 in all extremities. Sensation intact. Gait not checked.  PSYCHIATRIC: The patient is alert and oriented x 3.  SKIN: No obvious rash, lesion, or ulcer.   LABORATORY PANEL:   CBC  Recent Labs Lab 07/01/15 1655  WBC 19.1*  HGB 11.0*  HCT 34.5*  PLT 256  MCV 79.5*  MCH 25.2*  MCHC 31.8*  RDW 18.4*  LYMPHSABS 0.8*  MONOABS 0.8  EOSABS 0.1  BASOSABS 0.1   ------------------------------------------------------------------------------------------------------------------  Chemistries   Recent Labs Lab 07/01/15 1655  NA 131*  K 5.6*  CL 96*  CO2 22  GLUCOSE 167*  BUN 57*  CREATININE 2.66*  CALCIUM 9.0  AST 24  ALT 12*  ALKPHOS 365*  BILITOT 1.3*   ------------------------------------------------------------------------------------------------------------------ estimated creatinine clearance is 47.2 mL/min (by C-G formula based on Cr of 2.66). ------------------------------------------------------------------------------------------------------------------ No results for input(s): TSH, T4TOTAL, T3FREE, THYROIDAB in the last 72 hours.  Invalid input(s): FREET3   Coagulation profile No results for input(s): INR, PROTIME in the last 168 hours. ------------------------------------------------------------------------------------------------------------------- No results for input(s): DDIMER in the last 72  hours. -------------------------------------------------------------------------------------------------------------------  Cardiac Enzymes  Recent Labs Lab 07/01/15 1655  TROPONINI 0.04*   ------------------------------------------------------------------------------------------------------------------ Invalid input(s): POCBNP  ---------------------------------------------------------------------------------------------------------------  Urinalysis    Component Value Date/Time   COLORURINE STRAW* 04/09/2015 1943   COLORURINE Yellow 06/11/2014 1310   APPEARANCEUR CLEAR* 04/09/2015 1943   APPEARANCEUR Clear 06/11/2014 1310   LABSPEC 1.005 04/09/2015 1943   LABSPEC 1.005 06/11/2014 1310   PHURINE 7.0 04/09/2015 1943   PHURINE 6.0 06/11/2014 1310   GLUCOSEU NEGATIVE 04/09/2015 1943   GLUCOSEU Negative 06/11/2014 1310   HGBUR 1+* 04/09/2015 1943   HGBUR Negative 06/11/2014 1310   BILIRUBINUR NEGATIVE 04/09/2015 1943   BILIRUBINUR Negative 06/11/2014 1310   KETONESUR NEGATIVE 04/09/2015 1943   KETONESUR Negative 06/11/2014 1310   PROTEINUR 100* 04/09/2015 1943   PROTEINUR 25 mg/dL 16/06/9603 5409   NITRITE NEGATIVE 04/09/2015  1943   NITRITE Negative 06/11/2014 1310   LEUKOCYTESUR NEGATIVE 04/09/2015 1943   LEUKOCYTESUR Negative 06/11/2014 1310     RADIOLOGY: No results found.  EKG: Normal sinus rhythm  IMPRESSION AND PLAN:  * Left leg cellulitis  We will get a Doppler study to rule out DVT  We'll give IV Unasyn for now.  * Acute on chronic renal failure  Patient is on multiple diuretic medications, hold all of them  Gentle IV hydration, avoid nephrotoxic medications.  Nephrology consult, follow renal function daily.  * Hyperkalemia  Kayexalate oral 1 time, hold his oral potassium supplements.  * CHF  No active exacerbation at this time, we will hold diuretics.  Continue Coumadin as he was taking at home- check INR.  * Hypertension  Blood pressure  running normal, we will hold lisinopril because of renal failure.  * Hyperlipidemia  Continue stating  * Hypothyroidism  Continue levothyroxine.  * COPD  No symptoms of exacerbation  Continue home dose of inhalers.   All the records are reviewed and case discussed with ED provider. Management plans discussed with the patient, family and they are in agreement.  CODE STATUS: Full code    TOTAL TIME TAKING CARE OF THIS PATIENT: 50 minutes.  Plan discussed with patient and his wife who is present in the room.  Altamese Dilling M.D on 07/01/2015   Between 7am to 6pm - Pager - [phone]  After 6pm go to [email or web address] - password [email or web address]  [facility]  Office  [phone]  CC: Primary care physician; Marguarite Arbour, MD   Note: This dictation was prepared with Dragon dictation along with smaller phrase technology. Any transcriptional errors that result from this process are unintentional.

## 2015-07-01 NOTE — ED Notes (Signed)
Lab called troponin 0.04

## 2015-07-01 NOTE — ED Notes (Signed)
edmatous and reddened legs bilateral, states this episode began 3 days ago, states has had cellulitis in past

## 2015-07-01 NOTE — Patient Outreach (Signed)
[facility]) Care Management  07/01/2015  COREYON NICOTRA May 25, 1956 [id number]   Phone call to patient's spouse to follow up on sleep study appointment.  Appointment scheduled for 07/12/15 at 8:45am with sleep med.  Per patient's spouse, she has made a doctor's appointment for patient for today at 3:30pm due to a rash that has spread from his right to left leg.   Adriana Reams [facility] Care Management [phone]

## 2015-07-01 NOTE — ED Provider Notes (Signed)
Time Seen: Approximately.----------------------------------------- 7:34 PM on 07/01/2015 -----------------------------------------   I have reviewed the triage notes  Chief Complaint: Leg Swelling   History of Present Illness: Tyler [name] is a 59 y.o. male who presents with 3 day history of initially a small rash on the lateral surface of his right leg which I am his wife who called into the primary physician and thought to be possible localized allergic reaction. Patient was placed on Benadryl and it was noticed that he had some increased swelling in the left lower extremity. Patient has a long history of chronic peripheral edema. Area on the inside surface of his left leg became more red and indurated. He still has continued to have some low-grade fevers at home he was up to 101 on Saturday and then his temperature had come down to normal yesterday and then this morning was noticed to have a temperature of 100.0. Patient denies any new chest pain or shortness of breath. His blood sugars have been increasing a small amount over the last couple of days. He's had a steady weight over the last several days. Past Medical History  Diagnosis Date  . Cataract   . CHF (congestive heart failure) (HCC)   . COPD (chronic obstructive pulmonary disease) (HCC)   . Diabetes mellitus without complication (HCC)   . Sleep apnea   . Oxygen deficiency   . Thyroid disease   . Gout   . Hypothyroidism   . CKD (chronic kidney disease) stage 3, GFR 30-59 ml/min   . Asthma   . Hypertension     Patient Active Problem List   Diagnosis Date Noted  . Type 2 diabetes mellitus (HCC) 05/27/2015  . Hypothyroidism 05/27/2015  . HLD (hyperlipidemia) 05/27/2015  . Acute on chronic diastolic congestive heart failure (HCC)   . Depression, major, recurrent, moderate (HCC) 04/12/2015  . CHF (congestive heart failure) (HCC) 04/10/2015  . Anasarca 04/09/2015  . COPD, moderate (HCC) 03/12/2015  . Tobacco abuse  03/12/2015  . Acute CHF (HCC) 02/16/2015  . Hyponatremia 02/16/2015  . A-fib (HCC) 02/16/2015  . Chronic respiratory failure (HCC) 02/16/2015    Past Surgical History  Procedure Laterality Date  . Cholecystectomy      2002  . Eye surgery      right cataract removed 3/21  . Vena cava filter placement      Past Surgical History  Procedure Laterality Date  . Cholecystectomy      2002  . Eye surgery      right cataract removed 3/21  . Vena cava filter placement      Current Outpatient Rx  Name  Route  Sig  Dispense  Refill  . gabapentin (NEURONTIN) 100 MG capsule   Oral   Take 100 mg by mouth 2 (two) times daily.         . metolazone (ZAROXOLYN) 5 MG tablet   Oral   Take 5 mg by mouth daily.         . potassium chloride SA (K-DUR,KLOR-CON) 20 MEQ tablet   Oral   Take 20 mEq by mouth daily.         . pravastatin (PRAVACHOL) 40 MG tablet   Oral   Take 40 mg by mouth at bedtime.         Marland Kitchen spironolactone (ALDACTONE) 25 MG tablet   Oral   Take 25 mg by mouth 2 (two) times daily.         . tamsulosin (FLOMAX) 0.4 MG  CAPS capsule   Oral   Take 1 capsule (0.4 mg total) by mouth daily.   30 capsule   5   . torsemide (DEMADEX) 100 MG tablet   Oral   Take 50 mg by mouth daily.         Marland Kitchen warfarin (COUMADIN) 10 MG tablet   Oral   Take 5-10 mg by mouth every evening. Pt takes  on Tuesday, Wednesday, Thursday, Saturday, and Sunday.   Pt takes  on Monday and Friday.         Marland Kitchen acetaminophen (TYLENOL) 500 MG tablet   Oral   Take 1,000 mg by mouth every 6 (six) hours as needed for mild pain or headache.         Marland Kitchen alum & mag hydroxide-simeth (MAALOX/MYLANTA) 200-200-20 MG/5ML suspension   Oral   Take 30 mLs by mouth every 4 (four) hours as needed for indigestion or heartburn.   355 mL   0   . colchicine 0.6 MG tablet   Oral   Take 0.6 mg by mouth 2 (two) times daily as needed (for wrist pain).         . cyanocobalamin 2000 MCG tablet    Oral   Take 2,000 mcg by mouth daily.         . fluticasone (FLONASE) 50 MCG/ACT nasal spray   Each Nare   Place 2 sprays into both nostrils daily.   16 g   2   . furosemide (LASIX) 80 MG tablet   Oral   Take 1 tablet (80 mg total) by mouth 2 (two) times daily. Patient not taking: Reported on 06/13/2015   60 tablet   5   . insulin aspart (NOVOLOG) 100 UNIT/ML injection   Subcutaneous   Inject 0-5 Units into the skin at bedtime. Patient not taking: Reported on 06/04/2015   10 mL   11   . insulin aspart (NOVOLOG) 100 UNIT/ML injection   Subcutaneous   Inject 0-9 Units into the skin 3 (three) times daily with meals.   10 mL   11   . insulin NPH Human (HUMULIN N,NOVOLIN N) 100 UNIT/ML injection   Subcutaneous   Inject 0.5 mLs (50 Units total) into the skin 2 (two) times daily before a meal.   10 mL   11   . ipratropium-albuterol (DUONEB) 0.5-2.5 (3) MG/3ML SOLN   Nebulization   Take 3 mLs by nebulization every 4 (four) hours as needed. Patient taking differently: Take 3 mLs by nebulization every 4 (four) hours as needed (for shortness of breath).    360 mL   5   . lactulose (CHRONULAC) 10 GM/15ML solution   Oral   Take 30 g by mouth 2 (two) times daily as needed for mild constipation.          Marland Kitchen levothyroxine (SYNTHROID, LEVOTHROID) 150 MCG tablet   Oral   Take 1 tablet (150 mcg total) by mouth daily before breakfast.   30 tablet   5   . nystatin (MYCOSTATIN/NYSTOP) 100000 UNIT/GM POWD   Topical   Apply 1 g topically 2 (two) times daily as needed (for rash).          . ondansetron (ZOFRAN) 4 MG tablet   Oral   Take 1 tablet (4 mg total) by mouth every 6 (six) hours as needed for nausea. Patient not taking: Reported on 06/13/2015   20 tablet   0   . PARoxetine (PAXIL) 20 MG tablet   Oral  Take 20 mg by mouth daily.         . polyethylene glycol (MIRALAX / GLYCOLAX) packet   Oral   Take 17 g by mouth daily as needed for moderate constipation.           . salmeterol (SEREVENT) 50 MCG/DOSE diskus inhaler   Inhalation   Inhale 1 puff into the lungs every 12 (twelve) hours.   1 Inhaler   12   . sodium chloride 1 G tablet   Oral   Take 1 tablet (1 g total) by mouth daily. Patient not taking: Reported on 06/13/2015   30 tablet   5     Allergies:  Enalapril; Atorvastatin; and Lovastatin  Family History: Family History  Problem Relation Age of Onset  . Diabetes Sister   . Diabetes Brother   . Deep vein thrombosis Mother   . CVA Father     Social History: Social History  Substance Use Topics  . Smoking status: Current Some Day Smoker -- 0.00 packs/day for 30 years    Types: Cigars  . Smokeless tobacco: Never Used     Comment: cigar 3-4 yrs 1-3 per day/former Cigarettes  . Alcohol Use: No     Review of Systems:   10 point review of systems was performed and was otherwise negative:  Constitutional: Low-grade fever at home Eyes: No visual disturbances ENT: No sore throat, ear pain Cardiac: No chest pain Respiratory: No nasal shortness of breath, wheezing, or stridor Abdomen: No abdominal pain, no vomiting, No diarrhea Endocrine: No weight loss, No night sweats Extremities: No peripheral edema, cyanosis Skin: A small rash was also noticed on the right proximal thigh region lateral surface Neurologic: No focal weakness, trouble with speech or swollowing Urologic: No dysuria, Hematuria, or urinary frequency   Physical Exam:  ED Triage Vitals  Enc Vitals Group     BP 07/01/15 1649 123/67 mmHg     Pulse Rate 07/01/15 1649 77     Resp 07/01/15 1649 20     Temp 07/01/15 1649 98.3 F (36.8 C)     Temp Source 07/01/15 1649 Oral     SpO2 07/01/15 1649 95 %     Weight 07/01/15 1649 359 lb (162.841 kg)     Height 07/01/15 1649 6' (1.829 m)     Head Cir --      Peak Flow --      Pain Score 07/01/15 1651 9     Pain Loc --      Pain Edu? --      Excl. in GC? --     General: Awake , Alert , and Oriented times  3; GCS 15 Head: Normal cephalic , atraumatic Eyes: Pupils equal , round, reactive to light Nose/Throat: No nasal drainage, patent upper airway without erythema or exudate.  Neck: Supple, Full range of motion, No anterior adenopathy or palpable thyroid masses Lungs: Clear to ascultation without wheezes , rhonchi, or rales. Diminished at the bases Heart: Regular rate, regular rhythm without murmurs , gallops , or rubs Abdomen: Morbidly obese Soft, non tender without rebound, guarding , or rigidity; bowel sounds positive and symmetric in all 4 quadrants. No organomegaly .        Extremities: 4+ circumferential pitting edema in both lower extremities. Patient has an area of erythema or warmth and induration especially across the medial surface of his left lower extremity. Neurologic: normal ambulation, Motor symmetric without deficits, sensory intact Skin: warm, dry, no rashes  Labs:   All laboratory work was reviewed including any pertinent negatives or positives listed below:  Labs Reviewed  CBC WITH DIFFERENTIAL/PLATELET - Abnormal; Notable for the following:    WBC 19.1 (*)    RBC 4.34 (*)    Hemoglobin 11.0 (*)    HCT 34.5 (*)    MCV 79.5 (*)    MCH 25.2 (*)    MCHC 31.8 (*)    RDW 18.4 (*)    Neutro Abs 17.4 (*)    Lymphs Abs 0.8 (*)    All other components within normal limits  COMPREHENSIVE METABOLIC PANEL - Abnormal; Notable for the following:    Sodium 131 (*)    Potassium 5.6 (*)    Chloride 96 (*)    Glucose, Bld 167 (*)    BUN 57 (*)    Creatinine, Ser 2.66 (*)    Albumin 2.7 (*)    ALT 12 (*)    Alkaline Phosphatase 365 (*)    Total Bilirubin 1.3 (*)    GFR calc non Af Amer 25 (*)    GFR calc Af Amer 29 (*)    All other components within normal limits  TROPONIN I - Abnormal; Notable for the following:    Troponin I 0.04 (*)    All other components within normal limits  LACTIC ACID, PLASMA  LACTIC ACID, PLASMA   of significance is an elevated white blood cell  count and creatinine level.    ED Course: Patient's stay here was overall uneventful. Review of his laboratory work showed some renal insufficiency and a as tenths peripheral edema felt he would benefit with a fluid bolus. He was given 500 mL. Also initiate him on vancomycin for the left lower extremity cellulitis.  Assessment:  Left lower extremity cellulitis Renal insufficiency History of diabetes  Final Clinical Impression:  Final diagnoses:  Cellulitis of left lower extremity     Plan:  Inpatient management I spoke to the hospitalist team, further disposition and management depends upon her evaluation.            Jennye Moccasin, MD 07/01/15 [phone]

## 2015-07-01 NOTE — Patient Outreach (Signed)
Pt was not at home and I noted that our CSW had talked to him earlier today and he was going to the MD for the rash that was reported on one leg on Friday but now had spread to his other leg. I see that he was admitted to the hospital for leg swelling. I will be watching to see when pt is discharged and call him at that time.  Almetta Lovely [facility] Care Manager [phone]

## 2015-07-01 NOTE — ED Notes (Signed)
[place] at bedside to do ordered US.  Patient refuses test right now, stating "I will get that in the morning.  I do not want to get into the bed right now."

## 2015-07-02 ENCOUNTER — Inpatient Hospital Stay: Payer: Commercial Managed Care - HMO

## 2015-07-02 DIAGNOSIS — L8990 Pressure ulcer of unspecified site, unspecified stage: Secondary | ICD-10-CM | POA: Insufficient documentation

## 2015-07-02 DIAGNOSIS — N179 Acute kidney failure, unspecified: Secondary | ICD-10-CM | POA: Diagnosis not present

## 2015-07-02 LAB — CBC
HCT: 29.2 % — ABNORMAL LOW (ref 40.0–52.0)
HEMOGLOBIN: 9.6 g/dL — AB (ref 13.0–18.0)
MCH: 26 pg (ref 26.0–34.0)
MCHC: 33 g/dL (ref 32.0–36.0)
MCV: 78.8 fL — ABNORMAL LOW (ref 80.0–100.0)
Platelets: 220 10*3/uL (ref 150–440)
RBC: 3.7 MIL/uL — AB (ref 4.40–5.90)
RDW: 18.1 % — ABNORMAL HIGH (ref 11.5–14.5)
WBC: 20.8 10*3/uL — ABNORMAL HIGH (ref 3.8–10.6)

## 2015-07-02 LAB — GLUCOSE, CAPILLARY
GLUCOSE-CAPILLARY: 162 mg/dL — AB (ref 65–99)
GLUCOSE-CAPILLARY: 155 mg/dL — AB (ref 65–99)
Glucose-Capillary: 193 mg/dL — ABNORMAL HIGH (ref 65–99)
Glucose-Capillary: 197 mg/dL — ABNORMAL HIGH (ref 65–99)

## 2015-07-02 LAB — BASIC METABOLIC PANEL
ANION GAP: 10 (ref 5–15)
BUN: 56 mg/dL — ABNORMAL HIGH (ref 6–20)
CALCIUM: 8.3 mg/dL — AB (ref 8.9–10.3)
CO2: 22 mmol/L (ref 22–32)
Chloride: 100 mmol/L — ABNORMAL LOW (ref 101–111)
Creatinine, Ser: 2.46 mg/dL — ABNORMAL HIGH (ref 0.61–1.24)
GFR, EST AFRICAN AMERICAN: 31 mL/min — AB (ref >60)
GFR, EST NON AFRICAN AMERICAN: 27 mL/min — AB (ref >60)
Glucose, Bld: 168 mg/dL — ABNORMAL HIGH (ref 65–99)
Potassium: 5 mmol/L (ref 3.5–5.1)
Sodium: 132 mmol/L — ABNORMAL LOW (ref 135–145)

## 2015-07-02 LAB — LACTIC ACID, PLASMA: LACTIC ACID, VENOUS: 1.4 mmol/L (ref 0.5–2.0)

## 2015-07-02 MED ORDER — WARFARIN - PHYSICIAN DOSING INPATIENT
Freq: Every day | Status: DC
  Administered 2015-07-02: 18:00:00

## 2015-07-02 MED ORDER — WARFARIN SODIUM 5 MG PO TABS
5.0000 mg | ORAL_TABLET | ORAL | Status: DC
  Administered 2015-07-02: 5 mg via ORAL
  Filled 2015-07-02: qty 1

## 2015-07-02 MED ORDER — OXYCODONE-ACETAMINOPHEN 5-325 MG PO TABS
2.0000 | ORAL_TABLET | Freq: Four times a day (QID) | ORAL | Status: DC | PRN
  Administered 2015-07-02 – 2015-07-05 (×7): 2 via ORAL
  Filled 2015-07-02 (×7): qty 2

## 2015-07-02 MED ORDER — WARFARIN SODIUM 5 MG PO TABS: 10.0000 mg | ORAL_TABLET | ORAL | Status: DC

## 2015-07-02 MED ORDER — AMMONIUM LACTATE 12 % EX LOTN
TOPICAL_LOTION | CUTANEOUS | Status: DC | PRN
  Filled 2015-07-02: qty 400

## 2015-07-02 NOTE — Progress Notes (Signed)
Tyler [name] is a 59 y.o. male  Cellulitis of leg, left   SUBJECTIVE:  Pt admitted with LLE cellulitis and acute on chronic renal failure. Oxygenation stable. Still with LLE pain and swelling. Denies CP or SOB. Afebrile. WBC elevated.  ______________________________________________________________________  ROS: Review of systems is unremarkable for any active cardiac,respiratory, GI, GU, hematologic, neurologic or psychiatric systems, 10 systems reviewed.  @  Past Medical History  Diagnosis Date  . Cataract   . CHF (congestive heart failure) (HCC)   . COPD (chronic obstructive pulmonary disease) (HCC)   . Diabetes mellitus without complication (HCC)   . Sleep apnea   . Oxygen deficiency   . Thyroid disease   . Gout   . Hypothyroidism   . CKD (chronic kidney disease) stage 3, GFR 30-59 ml/min   . Asthma   . Hypertension     Past Surgical History  Procedure Laterality Date  . Cholecystectomy      2002  . Eye surgery      right cataract removed 3/21  . Vena cava filter placement      PHYSICAL EXAM:  BP 88/45 mmHg  Pulse 67  Temp(Src) 98.5 F (36.9 C) (Oral)  Resp 20  Ht 6' (1.829 m)  Wt 164.701 kg (363 lb 1.6 oz)  BMI 49.23 kg/m2  SpO2 97%  Wt Readings from Last 3 Encounters:  07/01/15 164.701 kg (363 lb 1.6 oz)  06/13/15 165.019 kg (363 lb 12.8 oz)  05/30/15 173.319 kg (382 lb 1.6 oz)            Constitutional: NAD Neck: supple, no thyromegaly Respiratory: basilar rales Cardiovascular: irregularly irregular no murmur, no gallop Abdomen: soft, good BS, nontender Extremities: 3+ edema with erythema, warmth, and tenderness. Neuro: alert and oriented, no focal motor or sensory deficits  ASSESSMENT/PLAN:  Labs and imaging studies were reviewed  LLE [place] today. Continue IV ABX. Consult the wound team and Nephrology. PT and CSW to evaluate. Continue coumadin. F/u labs in AM.

## 2015-07-02 NOTE — Progress Notes (Signed)
[facility]  ROUNDING NOTE   Subjective:  Pt very well known to [place]. Presents now with increasing lower extremity reddness, L > R.   On multiple diuretics as outpt including torsedmide, metolazone, and spironolactone.    Objective:  Vital signs in last 24 hours:  Temp:  [98.2 F (36.8 C)-98.5 F (36.9 C)] 98.5 F (36.9 C) (10/11 0606) Pulse Rate:  [65-83] 67 (10/11 0836) Resp:  [15-21] 20 (10/11 0606) BP: (85-126)/(45-76) 99/60 mmHg (10/11 0836) SpO2:  [93 %-97 %] 97 % (10/11 0606) Weight:  [162.841 kg (359 lb)-164.701 kg (363 lb 1.6 oz)] 164.701 kg (363 lb 1.6 oz) (10/10 2144)  Weight change:  Filed Weights   07/01/15 1649 07/01/15 2144  Weight: 162.841 kg (359 lb) 164.701 kg (363 lb 1.6 oz)    Intake/Output: I/O last 3 completed shifts: In: 287.5 [I.V.:287.5] Out: 450 [Urine:450]   Intake/Output this shift:  Total I/O In: 783.8 [P.O.:480; I.V.:303.8] Out: -   Physical Exam: General: NAD, morbdily obese  Head: Normocephalic, atraumatic. Moist oral mucosal membranes  Eyes: Anicteric, PERRL  Neck: Supple, trachea midline  Lungs:  Clear to auscultation normal effort  Heart: S1S2 no obvious rub  Abdomen:  Soft, distended, BS present, large pannus.  Extremities:  2+ b/l peripheral edema, reddness noted in LLE consistent with cellulitis  Neurologic: Nonfocal, moving all four extremities  Skin: Cellulitis LLE       Basic Metabolic Panel:  Recent Labs Lab 07/01/15 1655 07/02/15 0424  NA 131* 132*  K 5.6* 5.0  CL 96* 100*  CO2 22 22  GLUCOSE 167* 168*  BUN 57* 56*  CREATININE 2.66* 2.46*  CALCIUM 9.0 8.3*    Liver Function Tests:  Recent Labs Lab 07/01/15 1655  AST 24  ALT 12*  ALKPHOS 365*  BILITOT 1.3*  PROT 7.8  ALBUMIN 2.7*   No results for input(s): LIPASE, AMYLASE in the last 168 hours. No results for input(s): AMMONIA in the last 168 hours.  CBC:  Recent Labs Lab 07/01/15 1655 07/02/15 0424  WBC 19.1* 20.8*   NEUTROABS 17.4*  --   HGB 11.0* 9.6*  HCT 34.5* 29.2*  MCV 79.5* 78.8*  PLT 256 220    Cardiac Enzymes:  Recent Labs Lab 07/01/15 1655  TROPONINI 0.04*    BNP: Invalid input(s): POCBNP  CBG:  Recent Labs Lab 07/01/15 2317 07/02/15 0732 07/02/15 1221 07/02/15 1608  GLUCAP 161* 162* 193* 197*    Microbiology: Results for orders placed or performed in visit on 08/21/14  ED Influenza     Status: None   Collection Time: 08/21/14  4:53 PM  Result Value Ref Range Status   Influenza A By PCR NEGATIVE NEGATIVE Final   Influenza B By PCR NEGATIVE NEGATIVE Final   H1N1 flu by pcr NOT DETECTED NOT-DETECTED Final    Comment:                  ----------------------- The Xpert Flu assay (FDA approval for nasal aspirates or washes and nasopharyngeal swab specimens), is intended as an aid in the diagnosis of influenza and should not be used as a sole basis for treatment.     Coagulation Studies:  Recent Labs  07/01/15 1655  LABPROT 31.1*  INR 2.99    Urinalysis: No results for input(s): COLORURINE, LABSPEC, PHURINE, GLUCOSEU, HGBUR, BILIRUBINUR, KETONESUR, PROTEINUR, UROBILINOGEN, NITRITE, LEUKOCYTESUR in the last 72 hours.  Invalid input(s): APPERANCEUR    Imaging: US Venous Img Lower Unilateral Left  07/02/2015  CLINICAL DATA:  Left lower extremity pain and swelling for the past 4-5 days. History of smoking. History of prior DVT and IVC filter placement. Evaluate for acute or chronic DVT.  EXAM: LEFT LOWER EXTREMITY VENOUS DOPPLER ULTRASOUND  TECHNIQUE: Gray-scale sonography with graded compression, as well as color Doppler and duplex ultrasound were performed to evaluate the lower extremity deep venous systems from the level of the common femoral vein and including the common femoral, femoral, profunda femoral, popliteal and calf veins including the posterior tibial, peroneal and gastrocnemius veins when visible. The superficial great saphenous vein was also  interrogated. Spectral Doppler was utilized to evaluate flow at rest and with distal augmentation maneuvers in the common femoral, femoral and popliteal veins.  COMPARISON:  Bilateral lower extremity venous Doppler ultrasound - 10/30/2013  FINDINGS: Contralateral Common Femoral Vein: Respiratory phasicity is normal and symmetric with the symptomatic side. No evidence of thrombus. Normal compressibility.  Common Femoral Vein: No evidence of thrombus. Normal compressibility, respiratory phasicity and response to augmentation.  Saphenofemoral Junction: No evidence of thrombus. Normal compressibility and flow on color Doppler imaging.  Profunda Femoral Vein: No evidence of thrombus. Normal compressibility and flow on color Doppler imaging.  Femoral Vein: No evidence of thrombus. Normal compressibility, respiratory phasicity and response to augmentation.  Popliteal Vein: No evidence of thrombus. Normal compressibility, respiratory phasicity and response to augmentation.  Calf Veins: No evidence of thrombus. Normal compressibility and flow on color Doppler imaging.  Superficial Great Saphenous Vein: No evidence of thrombus. Normal compressibility and flow on color Doppler imaging.  Venous Reflux:  None.  Other Findings: Note is made of a benign appearing right inguinal lymph node which measures approximately 1 cm in greatest short axis diameter and maintains a benign fatty hila. Nonspecific prominent vascularity is noted about the hila of this lymph node.  IMPRESSION: No evidence of acute or chronic DVT within the left lower extremity.   Electronically Signed   By: Simonne Come M.D.   On: 07/02/2015 12:30     Medications:   . sodium chloride 75 mL/hr at 07/02/15 1512   . ampicillin-sulbactam (UNASYN) IV  3 g Intravenous Q6H  . cyanocobalamin  1,000 mcg Intramuscular Q30 days  . gabapentin  100 mg Oral BID  . insulin aspart  0-9 Units Subcutaneous TID WC  . levothyroxine  100 mcg Oral QAC breakfast  .  PARoxetine  20 mg Oral Daily  . pravastatin  40 mg Oral QHS  . sodium chloride  3 mL Intravenous Q12H  . tamsulosin  0.4 mg Oral Daily  . cyanocobalamin  2,000 mcg Oral Daily  . [START ON 07/05/2015] warfarin  10 mg Oral Once per day on Mon Fri   And  . warfarin  5 mg Oral Once per day on Sun Tue Wed Thu Sat  . Warfarin - Physician Dosing Inpatient   Does not apply q1800   acetaminophen, alum & mag hydroxide-simeth, ammonium lactate, fluticasone, hydrOXYzine, ipratropium-albuterol, lactulose, ondansetron, oxyCODONE-acetaminophen, polyethylene glycol, salmeterol  Assessment/ Plan:  59 y.o. male Caucasian male with bilateral pulmonary emboli, DVTs, hypercoagulable disorder, varicose veins, morbid obesity, chronic kidney disease with baseline creatinine 1.5-1.8, chronic history failure, obstructive sleep apnea, diabetes type 2 with nephropathy, diabetic retinopathy, neuropathy, chronic atrial fibrillation, chronic lower back pain, diastolic congestive heart failure, multiple bouts of LE cellulitis  1.  Acute renal failure/CKD stage III baseline Cr 1.6.  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  Acute renal failure maybe related to diuretic usage. Agree with  holding PO diuretics, continue IVF hydration for now, check renal US.  Further plan based upon progress.  2.  Anemia of CKD:  hgb down to 9.6, no urgent indication for procrit but may need to consider this.   3.  Hyponatremia:  Likely related to diuretics. Na up 132 today, continue IVF hydration with 0.9 NS.  4.  Thanks for consultation.      LOS: 1 Yitta Gongaware 10/11/20164:30 PM

## 2015-07-02 NOTE — Progress Notes (Signed)
PT Cancellation Note  Patient Details Name: Tyler [name] MRN: [id number] DOB: December 01, 1955   Cancelled Treatment:     PT reviewed patient's chart and noted patient will be undergoing [place] today to rule out DVT in LE. PT will wait to perform mobility evaluation until results from this study provide further guidance for plan of care.   Kerin Ransom, PT, DPT    07/02/2015, 8:36 AM

## 2015-07-02 NOTE — Plan of Care (Signed)
Problem: Consults Goal: Cellulitis Patient Education See Patient Education Module for education specifics. Outcome: Completed/Met Date Met:  07/02/15 Information given about celluitis

## 2015-07-02 NOTE — Clinical Social Work Note (Signed)
Clinical Social Work Assessment  Patient Details  Name: Tyler [name] MRN: [id number] Date of Birth: 1956/02/13  Date of referral:  07/02/15               Reason for consult:  Facility Placement, Frequent Admissions / ED Visits, Discharge Planning                Permission sought to share information with:  PCP, Case Manager, Family Supports Permission granted to share information::  Yes, Verbal Permission Granted  Name::        Agency::     Relationship::  Wife  Contact Information:     Housing/Transportation Living arrangements for the past 2 months:  Single Family Home Source of Information:  Patient, Adult Children, Case Manager Patient Interpreter Needed:  None Criminal Activity/Legal Involvement Pertinent to Current Situation/Hospitalization:  No - Comment as needed Significant Relationships:  Adult Children, Spouse, Community Support Lives with:  Adult Children, Spouse Do you feel safe going back to the place where you live?  No Need for family participation in patient care:  Yes (Comment)  Care giving concerns:  Pt lives with his wife and young adult son. Pt's wife works and son is in college locally. Pt has had many admissions due to chronic illness. He agreed to SNF last admission but did not stay one night. At that time, Pt was anxious about staying away from his wife longer than he already had while he was hospitalized. Pt reports that he called his wife from SNF the first night, she drove to SNF and picked him up to take him home. Wife shared with CSW that she is working with DSS to get Pt medicaid in order to get additional help at home. She asked CSW if there was someone who could help her get copies of his hospital bills from Sept 2014 to present. CSW will follow up with patient finance and [facility] CSW if additional support on the issue is needed after dc.     Social Worker assessment / plan:  CSW met with Pt to due to frequent admissions and possible need for facility  placement. Pt was sitting on bed with his wife at bedside during [place] visit. Pt asked CSW to leave immediately after entering the room but then started to share his feelings about going to SNF at last visit. Pt states that he will be going  Home at dc from hospital and will not go to SNF. Pt's wife reports that's she has talked to dss and that that patient will qualify for additional assistance at home through [facility] if she can get all the documents they need to finish his medicaid application. Pt is followed closely by [facility], which seems to be the only home health people he will allow in his home. CSW will follow up with Pt and his wife to continue to offer support while Pt is in hospital.     Employment status:  Disabled (Comment on whether or not currently receiving Disability) Insurance information:  Managed Medicare PT Recommendations:  Not assessed at this time Information / Referral to community resources:  [place]  Patient/Family's Response to care:  Pt remains frustrated with medical issues. He still does not understand "why everyone talks about my heart failure."  Patient/Family's Understanding of and Emotional Response to Diagnosis, Current Treatment, and Prognosis: Pt's has a labile temperament. He is verbally aggressive but is able to calm down and redirect. Pt declines SNF and is planning to  return home with wife and HH support through [facility].   Emotional Assessment Appearance:  Appears older than stated age, Disheveled Attitude/Demeanor/Rapport:  Avoidant, Hostile Affect (typically observed):  Agitated, Irritable Orientation:  Oriented to Self, Oriented to Place, Oriented to  Time, Oriented to Situation Alcohol / Substance use:  Never Used Psych involvement (Current and /or in the community):  No (Comment)  Discharge Needs  Concerns to be addressed:  Discharge Planning Concerns, Adjustment to Illness Readmission within the last 30 days:  No Current discharge risk:   Chronically ill, Dependent with Mobility Barriers to Discharge:  Continued Medical Work up   [employer], LCSW 07/02/2015, 11:09 PM

## 2015-07-02 NOTE — Consult Note (Signed)
WOC wound consult note Reason for Consult: Chronic venous insufficiency with 3+ pitting edema.  Refusing compression of any kind.  Scheduled for [place] LLE to rule out DVT today.  Wound type: Right dorsal foot with chronic ulcer present. States he merely puts a band aid on this area.  HAs removable compression at home but does not often wear.  I educated on the need for compression to facilitate excess fluid removal and he refuses at this time. After ultrasound to rule out DVT and once he is feeling a little better, I will address the issue once again.  Pressure Ulcer POA: No Measurement: 2 cm x 1.2 cm x 0.2 cm to rule dorsal foot.  Wound bed:100% ruddy red.  Drainage (amount, consistency, odor) Minimal serosanguinous drainage.  NO odor.  Periwound: Bilateral legs have generalized edema and erythema.  Refuses compression.  Ultrasound today to rule out DVT in LLE.  Dressing procedure/placement/frequency:Cleanse ulcer to left right dorsal foot with NS and pat gently dry.  Will treat conservatively at this time with NS moist gauze each shift. Cover with 4x4 gauze and kerlix/tape.  WIll encourage compression once DVT is ruled out and patient feels better. Amlactin cream to bilateral lower legs daily.  Will not follow at this time.  Please re-consult if needed.  Maple Hudson RN BSN CWON Pager [phone]

## 2015-07-02 NOTE — Progress Notes (Signed)
PT Cancellation Note  Patient Details Name: Tyler [name] MRN: [id number] DOB: 1956-04-05   Cancelled Treatment:    Reason Eval/Treat Not Completed: Other (comment) (See PT note for further details) PT attempted consult this afternoon however pt is refusing PT secondary to pain and not believing he needs therapy. He expressed his displeasure with the thought that he needs therapy. Will attempt tomorrow morning when pain better managed.    Benna Dunks 07/02/2015, 2:42 PM Benna Dunks, SPT. [phone]

## 2015-07-03 LAB — CBC WITH DIFFERENTIAL/PLATELET
Basophils Absolute: 0 10*3/uL (ref 0–0.1)
Basophils Relative: 0 %
Eosinophils Absolute: 0.1 10*3/uL (ref 0–0.7)
Eosinophils Relative: 1 %
HEMATOCRIT: 29.9 % — AB (ref 40.0–52.0)
HEMOGLOBIN: 9.7 g/dL — AB (ref 13.0–18.0)
LYMPHS ABS: 1 10*3/uL (ref 1.0–3.6)
LYMPHS PCT: 6 %
MCH: 25.8 pg — AB (ref 26.0–34.0)
MCHC: 32.5 g/dL (ref 32.0–36.0)
MCV: 79.3 fL — AB (ref 80.0–100.0)
Monocytes Absolute: 1.1 10*3/uL — ABNORMAL HIGH (ref 0.2–1.0)
Monocytes Relative: 7 %
NEUTROS PCT: 86 %
Neutro Abs: 13.3 10*3/uL — ABNORMAL HIGH (ref 1.4–6.5)
Platelets: 223 10*3/uL (ref 150–440)
RBC: 3.77 MIL/uL — AB (ref 4.40–5.90)
RDW: 18.1 % — ABNORMAL HIGH (ref 11.5–14.5)
WBC: 15.5 10*3/uL — AB (ref 3.8–10.6)

## 2015-07-03 LAB — COMPREHENSIVE METABOLIC PANEL
ALK PHOS: 307 U/L — AB (ref 38–126)
ALT: 10 U/L — AB (ref 17–63)
AST: 21 U/L (ref 15–41)
Albumin: 2.1 g/dL — ABNORMAL LOW (ref 3.5–5.0)
Anion gap: 10 (ref 5–15)
BUN: 55 mg/dL — ABNORMAL HIGH (ref 6–20)
CALCIUM: 8.2 mg/dL — AB (ref 8.9–10.3)
CO2: 24 mmol/L (ref 22–32)
CREATININE: 2.17 mg/dL — AB (ref 0.61–1.24)
Chloride: 101 mmol/L (ref 101–111)
GFR, EST AFRICAN AMERICAN: 37 mL/min — AB (ref >60)
GFR, EST NON AFRICAN AMERICAN: 32 mL/min — AB (ref >60)
Glucose, Bld: 148 mg/dL — ABNORMAL HIGH (ref 65–99)
Potassium: 4.4 mmol/L (ref 3.5–5.1)
SODIUM: 135 mmol/L (ref 135–145)
Total Bilirubin: 1.1 mg/dL (ref 0.3–1.2)
Total Protein: 6.5 g/dL (ref 6.5–8.1)

## 2015-07-03 LAB — GLUCOSE, CAPILLARY
GLUCOSE-CAPILLARY: 176 mg/dL — AB (ref 65–99)
Glucose-Capillary: 160 mg/dL — ABNORMAL HIGH (ref 65–99)
Glucose-Capillary: 200 mg/dL — ABNORMAL HIGH (ref 65–99)
Glucose-Capillary: 195 mg/dL — ABNORMAL HIGH (ref 65–99)

## 2015-07-03 LAB — PROTIME-INR
INR: 3.52
Prothrombin Time: 35.3 seconds — ABNORMAL HIGH (ref 11.4–15.0)

## 2015-07-03 MED ORDER — SODIUM CHLORIDE 0.9 % IV SOLN
INTRAVENOUS | Status: DC
  Administered 2015-07-03 – 2015-07-05 (×4): via INTRAVENOUS

## 2015-07-03 MED ORDER — AMMONIUM LACTATE 12 % EX LOTN
TOPICAL_LOTION | CUTANEOUS | Status: DC | PRN
  Administered 2015-07-03: 17:00:00 via TOPICAL
  Filled 2015-07-03: qty 226

## 2015-07-03 MED ORDER — SODIUM CHLORIDE 0.9 % IJ SOLN: 3.0000 mL | INTRAMUSCULAR | Status: DC | PRN

## 2015-07-03 NOTE — Consult Note (Signed)
   [facility] management services. Patient has been engaged by a [employer] Community Care Coordinator and LCSW. I have spoken with Mr.Wachtel and he is agreeable to [facility] after discharge. Our Community based plan of care has focused on disease management and community resource support.Patient will receive a post discharge transition of care call and will be evaluated for monthly home visits for assessments and disease process education. I have made Inpatient Case Manager aware of the following. Of note, [facility] services does not replace or interfere with any services that are arranged by inpatient case management or social work. For additional questions or referral please contact.  Egbert GaribaldiKimberly Shaelynn Dragos, RN, [facility] Care Management Coordinator/Hospital Liaison [facility] [phone] Mobile [phone] Toll Free Main Office

## 2015-07-03 NOTE — Progress Notes (Signed)
Initial Nutrition Assessment    INTERVENTION:  Meals and snacks: Cater to pt preferences   NUTRITION DIAGNOSIS:    (None at this time) related to   as evidenced by  .    GOAL:   Patient will meet greater than or equal to 90% of their needs    MONITOR:    (Energy intake, Digestive system, Electrolyte and renal profile)  REASON FOR ASSESSMENT:    (pressure ulcer stage II)    ASSESSMENT:      Pt admitted with left leg cellulitis, acute renal failure, CHF, hyperkalemia  Past Medical History  Diagnosis Date  . Cataract   . CHF (congestive heart failure) (HCC)   . COPD (chronic obstructive pulmonary disease) (HCC)   . Diabetes mellitus without complication (HCC)   . Sleep apnea   . Oxygen deficiency   . Thyroid disease   . Gout   . Hypothyroidism   . CKD (chronic kidney disease) stage 3, GFR 30-59 ml/min   . Asthma   . Hypertension     Current Nutrition: eating 50-100% of meals  Food/Nutrition-Related History: normal intake prior to admission   Medications: vit b12, aspart, NS at 7075ml/hr  Electrolyte/Renal Profile and Glucose Profile:   Recent Labs Lab 07/01/15 1655 07/02/15 0424 07/03/15 0431  NA 131* 132* 135  K 5.6* 5.0 4.4  CL 96* 100* 101  CO2 22 22 24   BUN 57* 56* 55*  CREATININE 2.66* 2.46* 2.17*  CALCIUM 9.0 8.3* 8.2*  GLUCOSE 167* 168* 148*   Protein Profile:  Recent Labs Lab 07/01/15 1655 07/03/15 0431  ALBUMIN 2.7* 2.1*    Gastrointestinal Profile: Last BM: 10/12     Weight Change: stable weight   Diet Order:  Diet heart healthy/carb modified Room service appropriate?: Yes; Fluid consistency:: Thin  Skin:   reviewed   Height:   Ht Readings from Last 1 Encounters:  07/01/15 6' (1.829 m)    Weight:   Wt Readings from Last 1 Encounters:  07/01/15 363 lb 1.6 oz (164.701 kg)     BMI:  Body mass index is 49.23 kg/(m^2).   EDUCATION NEEDS:   No education needs identified at this time  LOW Care  Level  Othel Dicostanzo B. Freida BusmanAllen, RD, LDN [phone] (pager)

## 2015-07-03 NOTE — Progress Notes (Signed)
[facility]  ROUNDING NOTE   Subjective:  Renal function has improved with hydration. Creatinine down to 2.1. Renal ultrasound negative for hydronephrosis.  Next line patient still has some pain in the left lower extremity.   Objective:  Vital signs in last 24 hours:  Temp:  [97.8 F (36.6 C)-98.5 F (36.9 C)] 97.8 F (36.6 C) (10/12 0959) Pulse Rate:  [64-76] 70 (10/12 1110) Resp:  [18-25] 19 (10/12 1110) BP: (79-115)/(50-82) 114/58 mmHg (10/12 1110) SpO2:  [95 %-99 %] 96 % (10/12 1110)  Weight change:  Filed Weights   07/01/15 1649 07/01/15 2144  Weight: 162.841 kg (359 lb) 164.701 kg (363 lb 1.6 oz)    Intake/Output: I/O last 3 completed shifts: In: 1071.3 [P.O.:480; I.V.:591.3] Out: 950 [Urine:950]   Intake/Output this shift:  Total I/O In: 480 [P.O.:480] Out: -   Physical Exam: General: NAD, morbdily obese  Head: Normocephalic, atraumatic. Moist oral mucosal membranes  Eyes: Anicteric, PERRL  Neck: Supple, trachea midline  Lungs:  Clear to auscultation normal effort  Heart: S1S2 no obvious rub  Abdomen:  Soft, distended, BS present, large pannus.  Extremities:  2+ b/l peripheral edema, reddness noted in LLE consistent with cellulitis  Neurologic: Nonfocal, moving all four extremities  Skin: Cellulitis LLE       Basic Metabolic Panel:  Recent Labs Lab 07/01/15 1655 07/02/15 0424 07/03/15 0431  NA 131* 132* 135  K 5.6* 5.0 4.4  CL 96* 100* 101  CO2 22 22 24   GLUCOSE 167* 168* 148*  BUN 57* 56* 55*  CREATININE 2.66* 2.46* 2.17*  CALCIUM 9.0 8.3* 8.2*    Liver Function Tests:  Recent Labs Lab 07/01/15 1655 07/03/15 0431  AST 24 21  ALT 12* 10*  ALKPHOS 365* 307*  BILITOT 1.3* 1.1  PROT 7.8 6.5  ALBUMIN 2.7* 2.1*   No results for input(s): LIPASE, AMYLASE in the last 168 hours. No results for input(s): AMMONIA in the last 168 hours.  CBC:  Recent Labs Lab 07/01/15 1655 07/02/15 0424 07/03/15 0431  WBC 19.1* 20.8*  15.5*  NEUTROABS 17.4*  --  13.3*  HGB 11.0* 9.6* 9.7*  HCT 34.5* 29.2* 29.9*  MCV 79.5* 78.8* 79.3*  PLT 256 220 223    Cardiac Enzymes:  Recent Labs Lab 07/01/15 1655  TROPONINI 0.04*    BNP: Invalid input(s): POCBNP  CBG:  Recent Labs Lab 07/02/15 1221 07/02/15 1608 07/02/15 2127 07/03/15 0722 07/03/15 1109  GLUCAP 193* 197* 155* 160* 200*    Microbiology: Results for orders placed or performed in visit on 08/21/14  ED Influenza     Status: None   Collection Time: 08/21/14  4:53 PM  Result Value Ref Range Status   Influenza A By PCR NEGATIVE NEGATIVE Final   Influenza B By PCR NEGATIVE NEGATIVE Final   H1N1 flu by pcr NOT DETECTED NOT-DETECTED Final    Comment:                  ----------------------- The Xpert Flu assay (FDA approval for nasal aspirates or washes and nasopharyngeal swab specimens), is intended as an aid in the diagnosis of influenza and should not be used as a sole basis for treatment.     Coagulation Studies:  Recent Labs  07/01/15 1655 07/03/15 0431  LABPROT 31.1* 35.3*  INR 2.99 3.52    Urinalysis: No results for input(s): COLORURINE, LABSPEC, PHURINE, GLUCOSEU, HGBUR, BILIRUBINUR, KETONESUR, PROTEINUR, UROBILINOGEN, NITRITE, LEUKOCYTESUR in the last 72 hours.  Invalid input(s): APPERANCEUR  Imaging: US Renal  07/02/2015  CLINICAL DATA:  Acute renal failure EXAM: RENAL / URINARY TRACT ULTRASOUND COMPLETE COMPARISON:  None. FINDINGS: Right Kidney: Length: 15.4 cm.  No mass or hydronephrosis. Left Kidney: Length: 13.7 cm.  No mass or hydronephrosis. Bladder: Underdistended/not visualized. Additional comments: Abdominal ascites. IMPRESSION: No hydronephrosis. Bladder is not discretely visualized/underdistended. Abdominal ascites. Electronically Signed   By: Charline Bills M.D.   On: 07/02/2015 18:13   US Venous Img Lower Unilateral Left  07/02/2015  CLINICAL DATA:  Left lower extremity pain and swelling for the past 4-5  days. History of smoking. History of prior DVT and IVC filter placement. Evaluate for acute or chronic DVT. EXAM: LEFT LOWER EXTREMITY VENOUS DOPPLER ULTRASOUND TECHNIQUE: Gray-scale sonography with graded compression, as well as color Doppler and duplex ultrasound were performed to evaluate the lower extremity deep venous systems from the level of the common femoral vein and including the common femoral, femoral, profunda femoral, popliteal and calf veins including the posterior tibial, peroneal and gastrocnemius veins when visible. The superficial great saphenous vein was also interrogated. Spectral Doppler was utilized to evaluate flow at rest and with distal augmentation maneuvers in the common femoral, femoral and popliteal veins. COMPARISON:  Bilateral lower extremity venous Doppler ultrasound - 10/30/2013 FINDINGS: Contralateral Common Femoral Vein: Respiratory phasicity is normal and symmetric with the symptomatic side. No evidence of thrombus. Normal compressibility. Common Femoral Vein: No evidence of thrombus. Normal compressibility, respiratory phasicity and response to augmentation. Saphenofemoral Junction: No evidence of thrombus. Normal compressibility and flow on color Doppler imaging. Profunda Femoral Vein: No evidence of thrombus. Normal compressibility and flow on color Doppler imaging. Femoral Vein: No evidence of thrombus. Normal compressibility, respiratory phasicity and response to augmentation. Popliteal Vein: No evidence of thrombus. Normal compressibility, respiratory phasicity and response to augmentation. Calf Veins: No evidence of thrombus. Normal compressibility and flow on color Doppler imaging. Superficial Great Saphenous Vein: No evidence of thrombus. Normal compressibility and flow on color Doppler imaging. Venous Reflux:  None. Other Findings: Note is made of a benign appearing right inguinal lymph node which measures approximately 1 cm in greatest short axis diameter and maintains  a benign fatty hila. Nonspecific prominent vascularity is noted about the hila of this lymph node. IMPRESSION: No evidence of acute or chronic DVT within the left lower extremity. Electronically Signed   By: Simonne Come M.D.   On: 07/02/2015 12:30     Medications:   . sodium chloride 75 mL/hr at 07/03/15 0741   . ampicillin-sulbactam (UNASYN) IV  3 g Intravenous Q6H  . cyanocobalamin  1,000 mcg Intramuscular Q30 days  . gabapentin  100 mg Oral BID  . insulin aspart  0-9 Units Subcutaneous TID WC  . levothyroxine  100 mcg Oral QAC breakfast  . PARoxetine  20 mg Oral Daily  . pravastatin  40 mg Oral QHS  . sodium chloride  3 mL Intravenous Q12H  . tamsulosin  0.4 mg Oral Daily  . cyanocobalamin  2,000 mcg Oral Daily   acetaminophen, alum & mag hydroxide-simeth, ammonium lactate, fluticasone, hydrOXYzine, ipratropium-albuterol, lactulose, ondansetron, oxyCODONE-acetaminophen, polyethylene glycol, salmeterol  Assessment/ Plan:  59 y.o. male Caucasian male with bilateral pulmonary emboli, DVTs, hypercoagulable disorder, varicose veins, morbid obesity, chronic kidney disease with baseline creatinine 1.5-1.8, chronic history failure, obstructive sleep apnea, diabetes type 2 with nephropathy, diabetic retinopathy, neuropathy, chronic atrial fibrillation, chronic lower back pain, diastolic congestive heart failure, multiple bouts of LE cellulitis  1.  Acute renal failure/CKD stage III  baseline Cr 1.6.  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  Acute renal failure maybe related to diuretic usage. Creatinine has improved down to 2.1.  Renal ultrasound negative for hydronephrosis.  Continue IV fluid hydration for now and hold diuretic therapy.  2.  Anemia of CKD:  Hemoglobin relatively stable at 9.7.  No urgent indication for Procrit at present.   3.  Hyponatremia:  Likely related to diuretics. Sodium improved to 135, will continue 0.9 normal saline.      LOS: 2 Correy Weidner 10/12/20162:38  PM

## 2015-07-03 NOTE — Care Management (Signed)
Spoke with patient's wife.  She supports her husband's decision not to return to a skilled nursing facility.  [facility]  Agency is aware of admission.    Wife says that patient is "really showing progress.  She has changed the way she cooks, patient's weight has decreased and blood sugars are better.  Saw vascular and does not have to return for 6 months- which is a good thing.  Patient is taking his medications correctly.  Sore started showing up on patient's right leg over night.   Contacted PCP and instructed to take Benadryl. Contacted PCP again Monday when left leg started to show same sx.  Relayed to PCP office needed to be seen but there were no appointments until mid to the end of the week. Presented to the ED and admitted with cellulitis.  Wife thinks patient "may consider to have a nurse come to the home again."  Discussed palliative care for frequent admissions and sx but wife declines as "this is totally unrelated to his other stuff and we tried to get in to see the doctor."  [facility] and had to cancel due to inability to pay the copay at the time of the visits.  Says the office would not work with her. With Ms Hirschi's permission,  CM will contact office to discuss this issue.  CSW is working with patient in regards [facility]

## 2015-07-03 NOTE — Progress Notes (Signed)
Tyler PittMichael [name] is a 59 y.o. male  Cellulitis of leg, left   SUBJECTIVE:  Pt still with LLE pain. Has refused PT. Refuses CPAP. Nephrology and Wound Team consults appreciated. Renal fxn improved this AM. WBC improving. INR too high.  ______________________________________________________________________  ROS: Review of systems is unremarkable for any active cardiac,respiratory, GI, GU, hematologic, neurologic or psychiatric systems, 10 systems reviewed.  @CMEDLIST @  Past Medical History  Diagnosis Date  . Cataract   . CHF (congestive heart failure) (HCC)   . COPD (chronic obstructive pulmonary disease) (HCC)   . Diabetes mellitus without complication (HCC)   . Sleep apnea   . Oxygen deficiency   . Thyroid disease   . Gout   . Hypothyroidism   . CKD (chronic kidney disease) stage 3, GFR 30-59 ml/min   . Asthma   . Hypertension     Past Surgical History  Procedure Laterality Date  . Cholecystectomy      2002  . Eye surgery      right cataract removed 3/21  . Vena cava filter placement      PHYSICAL EXAM:  BP 113/60 mmHg  Pulse 75  Temp(Src) 98 F (36.7 C) (Oral)  Resp 23  Ht 6' (1.829 m)  Wt 164.701 kg (363 lb 1.6 oz)  BMI 49.23 kg/m2  SpO2 99%  Wt Readings from Last 3 Encounters:  07/01/15 164.701 kg (363 lb 1.6 oz)  06/13/15 165.019 kg (363 lb 12.8 oz)  05/30/15 173.319 kg (382 lb 1.6 oz)            Constitutional: NAD Neck: supple, no thyromegaly Respiratory: basilar rales Cardiovascular: irregularly irregular, no murmur, no gallop Abdomen: soft, good BS, nontender Extremities: 3+ edema with LLE erythema Neuro: alert and oriented, no focal motor or sensory deficits  ASSESSMENT/PLAN:  Labs and imaging studies were reviewed  Will continue IV ABX. Hold coumadin. Repeat labs in AM. Hope to D/C Friday home with home health as pt refuses placement.

## 2015-07-03 NOTE — Evaluation (Signed)
Physical Therapy Evaluation Patient Details Name: Tyler AusMichael A [name] MRN: [id number] DOB: 04/29/1956 Today's Date: 07/03/2015   History of Present Illness  Pt is a 59 yo male who was admitted to the hospital for LLE cellulitis  Clinical Impression  Pt presents with hx of CHF, COPD, DM, Gout, HTN, and CKD. Examination reveals that pt performs bed mobility at mod assist and is limited in his ability to perform transfers or ambulation secondary to pain in his LLE. Pt is willing to perform bed exercises and is generally pleasant with this. Pt shows weakness in bilateral LEs as well as decreased endurance as he needs rest breaks after minor mobility such as getting into sitting at EOB. Due to his pain, strength, and endurance deficits he will continue to benefit from skilled PT in order for him to eventually return home safely.     Follow Up Recommendations SNF    Equipment Recommendations  None recommended by PT    Recommendations for Other Services       Precautions / Restrictions Precautions Precautions: Fall Restrictions Weight Bearing Restrictions: No      Mobility  Bed Mobility Overal bed mobility: Needs Assistance Bed Mobility: Supine to Sit     Supine to sit: Mod assist     General bed mobility comments: Pt requires relatively heavy assist with outreached hand of therapist to get into sitting at EOB. He is able to self-manage his LEs fairly well. Needs cues not to hold breath  Transfers Overall transfer level:  (Pt not willing to attempt secondary to pain in LLE)                  Ambulation/Gait Ambulation/Gait assistance:  (Not appropriate this morning)              Stairs            Wheelchair Mobility    Modified Rankin (Stroke Patients Only)       Balance Overall balance assessment: No apparent balance deficits (not formally assessed) (Not able to assess)                                           Pertinent  Vitals/Pain Pain Assessment: 0-10 Pain Score: 9  Pain Location: LLE Pain Descriptors / Indicators: Burning;Constant Pain Intervention(s): Limited activity within patient's tolerance;Monitored during session;Premedicated before session    Home Living Family/patient expects to be discharged to:: Private residence Living Arrangements: Spouse/significant other Available Help at Discharge: Family Type of Home: Mobile home Home Access: Ramped entrance     Home Layout: One level Home Equipment: Walker - standard;Wheelchair - [employer] systems engineermanual;Shower seat;Wheelchair - power Additional Comments: Wife is not able to provide 24 hr care if needed    Prior Function Level of Independence: Independent with assistive device(s)         Comments: Household ambulator with standard walker occasionally     Hand Dominance        Extremity/Trunk Assessment   Upper Extremity Assessment: Overall WFL for tasks assessed           Lower Extremity Assessment: Generalized weakness (Not able to complete AROM)         Communication   Communication: No difficulties  Cognition Arousal/Alertness: Awake/alert Behavior During Therapy: WFL for tasks assessed/performed Overall Cognitive Status: Within Functional Limits for tasks assessed  General Comments      Exercises Other Exercises Other Exercises: Pt performed bilateral therex x 12 reps at min assist for facilitation of movement. Exercises included: ankle pumps, LAQ, hip marching, reaching outside BOS, scap squeezes, SLR, and hip abd.       Assessment/Plan    PT Assessment Patient needs continued PT services  PT Diagnosis Difficulty walking;Abnormality of gait;Generalized weakness;Acute pain   PT Problem List Decreased strength;Decreased range of motion;Decreased mobility;Pain;Obesity  PT Treatment Interventions DME instruction;Gait training;Stair training;Functional mobility training;Therapeutic activities;Balance  training;Therapeutic exercise;Neuromuscular re-education   PT Goals (Current goals can be found in the Care Plan section) Acute Rehab PT Goals Patient Stated Goal: to return home PT Goal Formulation: With patient Time For Goal Achievement: 07/17/15 Potential to Achieve Goals: Fair    Frequency Min 2X/week   Barriers to discharge        Co-evaluation               End of Session Equipment Utilized During Treatment: Gait belt Activity Tolerance: Patient limited by pain Patient left: in bed;with call bell/phone within reach;with bed alarm set Nurse Communication: Mobility status         Time: 7829-5621 PT Time Calculation (min) (ACUTE ONLY): 29 min   Charges:         PT G CodesBenna Dunks 2015-07-25, 11:50 AM  Benna Dunks, SPT. [phone]

## 2015-07-03 NOTE — Care Management Important Message (Signed)
Important Message  Patient Details  Name: Tyler AusMichael A Savoca MRN: [id number] Date of Birth: 12/20/1955   Medicare Important Message Given:  Yes-second notification given    Eber HongGreene, Airi Copado R, RN 07/03/2015, 2:51 PM

## 2015-07-04 LAB — GLUCOSE, CAPILLARY
GLUCOSE-CAPILLARY: 165 mg/dL — AB (ref 65–99)
GLUCOSE-CAPILLARY: 207 mg/dL — AB (ref 65–99)
Glucose-Capillary: 188 mg/dL — ABNORMAL HIGH (ref 65–99)
Glucose-Capillary: 180 mg/dL — ABNORMAL HIGH (ref 65–99)

## 2015-07-04 LAB — CBC WITH DIFFERENTIAL/PLATELET
BASOS PCT: 0 %
Basophils Absolute: 0 10*3/uL (ref 0–0.1)
EOS ABS: 0.1 10*3/uL (ref 0–0.7)
Eosinophils Relative: 1 %
HCT: 29.2 % — ABNORMAL LOW (ref 40.0–52.0)
HEMOGLOBIN: 9.4 g/dL — AB (ref 13.0–18.0)
Lymphocytes Relative: 5 %
Lymphs Abs: 0.8 10*3/uL — ABNORMAL LOW (ref 1.0–3.6)
MCH: 25.2 pg — ABNORMAL LOW (ref 26.0–34.0)
MCHC: 32.1 g/dL (ref 32.0–36.0)
MCV: 78.4 fL — ABNORMAL LOW (ref 80.0–100.0)
MONOS PCT: 7 %
Monocytes Absolute: 1.1 10*3/uL — ABNORMAL HIGH (ref 0.2–1.0)
NEUTROS PCT: 87 %
Neutro Abs: 15.3 10*3/uL — ABNORMAL HIGH (ref 1.4–6.5)
PLATELETS: 260 10*3/uL (ref 150–440)
RBC: 3.73 MIL/uL — ABNORMAL LOW (ref 4.40–5.90)
RDW: 17.8 % — AB (ref 11.5–14.5)
WBC: 17.4 10*3/uL — ABNORMAL HIGH (ref 3.8–10.6)

## 2015-07-04 LAB — BASIC METABOLIC PANEL
Anion gap: 10 (ref 5–15)
BUN: 54 mg/dL — ABNORMAL HIGH (ref 6–20)
CALCIUM: 8 mg/dL — AB (ref 8.9–10.3)
CO2: 23 mmol/L (ref 22–32)
CREATININE: 1.9 mg/dL — AB (ref 0.61–1.24)
Chloride: 99 mmol/L — ABNORMAL LOW (ref 101–111)
GFR, EST AFRICAN AMERICAN: 43 mL/min — AB (ref >60)
GFR, EST NON AFRICAN AMERICAN: 37 mL/min — AB (ref >60)
Glucose, Bld: 160 mg/dL — ABNORMAL HIGH (ref 65–99)
Potassium: 4.2 mmol/L (ref 3.5–5.1)
SODIUM: 132 mmol/L — AB (ref 135–145)

## 2015-07-04 LAB — PROTIME-INR
INR: 3.99
PROTHROMBIN TIME: 38.8 s — AB (ref 11.4–15.0)

## 2015-07-04 NOTE — Progress Notes (Signed)
PT Cancellation Note  Patient Details Name: Tyler [name] MRN: [id number] DOB: 1955-12-05   Cancelled Treatment:    Reason Eval/Treat Not Completed: Other (comment) (See PT note for further details) PT attempted this afternoon, however pt not wishing to participate with therapy and is agitated that he needs to be bothered. PT will attempt at later time/date when better received.    Benna DunksCasey Maren Wiesen 07/04/2015, 4:40 PM  Benna Dunksasey Eldra Word, SPT. [phone]

## 2015-07-04 NOTE — Progress Notes (Signed)
Tyler [name] is a 59 y.o. male  Cellulitis of leg, left   SUBJECTIVE:  Pt in NAD. Denies CP or SOB. LLE feeling better. Renal fxn improving.  ______________________________________________________________________  ROS: Review of systems is unremarkable for any active cardiac,respiratory, GI, GU, hematologic, neurologic or psychiatric systems, 10 systems reviewed.  @CMEDLIST @  Past Medical History  Diagnosis Date  . Cataract   . CHF (congestive heart failure) (HCC)   . COPD (chronic obstructive pulmonary disease) (HCC)   . Diabetes mellitus without complication (HCC)   . Sleep apnea   . Oxygen deficiency   . Thyroid disease   . Gout   . Hypothyroidism   . CKD (chronic kidney disease) stage 3, GFR 30-59 ml/min   . Asthma   . Hypertension     Past Surgical History  Procedure Laterality Date  . Cholecystectomy      2002  . Eye surgery      right cataract removed 3/21  . Vena cava filter placement      PHYSICAL EXAM:  BP 95/56 mmHg  Pulse 76  Temp(Src) 98.3 F (36.8 C) (Oral)  Resp 20  Ht 6' (1.829 m)  Wt 164.701 kg (363 lb 1.6 oz)  BMI 49.23 kg/m2  SpO2 96%  Wt Readings from Last 3 Encounters:  07/01/15 164.701 kg (363 lb 1.6 oz)  06/13/15 165.019 kg (363 lb 12.8 oz)  05/30/15 173.319 kg (382 lb 1.6 oz)            Constitutional: NAD Neck: supple, no thyromegaly Respiratory: basilar rales Cardiovascular: irregularly irregular, no murmur, no gallop Abdomen: soft, good BS, nontender Extremities: 3+ edema with stasis changes Neuro: alert and oriented, no focal motor or sensory deficits  ASSESSMENT/PLAN:  Labs and imaging studies were reviewed  No change in care. Continue IV ABX and IV fluids. Labs in AM. Plan D/C home tomorrow with po ABX and home health.

## 2015-07-04 NOTE — Progress Notes (Signed)
[facility]  ROUNDING NOTE   Subjective:  Pt seen at bedside. Doing better. Cr down to 1.90. Still has considerbable edema and reddness in LLE.   Objective:  Vital signs in last 24 hours:  Temp:  [97.4 F (36.3 C)-98.3 F (36.8 C)] 97.4 F (36.3 C) (10/13 1105) Pulse Rate:  [70-76] 70 (10/13 1105) Resp:  [20] 20 (10/13 1105) BP: (95-102)/(53-58) 102/58 mmHg (10/13 1105) SpO2:  [94 %-96 %] 94 % (10/13 1105)  Weight change:  Filed Weights   07/01/15 1649 07/01/15 2144  Weight: 162.841 kg (359 lb) 164.701 kg (363 lb 1.6 oz)    Intake/Output: I/O last 3 completed shifts: In: 1420 [P.O.:720; I.V.:600; IV Piggyback:100] Out: 550 [Urine:550]   Intake/Output this shift:  Total I/O In: 30 [P.O.:30] Out: -   Physical Exam: General: NAD, morbdily obese  Head: Normocephalic, atraumatic. Moist oral mucosal membranes  Eyes: Anicteric, PERRL  Neck: Supple, trachea midline  Lungs:  Clear to auscultation normal effort  Heart: S1S2 no obvious rub  Abdomen:  Soft, distended, BS present, large pannus.  Extremities:  2+ b/l peripheral edema, reddness noted in LLE consistent with cellulitis  Neurologic: Nonfocal, moving all four extremities  Skin: Cellulitis LLE       Basic Metabolic Panel:  Recent Labs Lab 07/01/15 1655 07/02/15 0424 07/03/15 0431 07/04/15 0445  NA 131* 132* 135 132*  K 5.6* 5.0 4.4 4.2  CL 96* 100* 101 99*  CO2 22 22 24 23   GLUCOSE 167* 168* 148* 160*  BUN 57* 56* 55* 54*  CREATININE 2.66* 2.46* 2.17* 1.90*  CALCIUM 9.0 8.3* 8.2* 8.0*    Liver Function Tests:  Recent Labs Lab 07/01/15 1655 07/03/15 0431  AST 24 21  ALT 12* 10*  ALKPHOS 365* 307*  BILITOT 1.3* 1.1  PROT 7.8 6.5  ALBUMIN 2.7* 2.1*   No results for input(s): LIPASE, AMYLASE in the last 168 hours. No results for input(s): AMMONIA in the last 168 hours.  CBC:  Recent Labs Lab 07/01/15 1655 07/02/15 0424 07/03/15 0431 07/04/15 0445  WBC 19.1* 20.8*  15.5* 17.4*  NEUTROABS 17.4*  --  13.3* 15.3*  HGB 11.0* 9.6* 9.7* 9.4*  HCT 34.5* 29.2* 29.9* 29.2*  MCV 79.5* 78.8* 79.3* 78.4*  PLT 256 220 223 260    Cardiac Enzymes:  Recent Labs Lab 07/01/15 1655  TROPONINI 0.04*    BNP: Invalid input(s): POCBNP  CBG:  Recent Labs Lab 07/03/15 1606 07/03/15 2035 07/04/15 0749 07/04/15 1106 07/04/15 1624  GLUCAP 195* 176* 165* 188* 180*    Microbiology: Results for orders placed or performed in visit on 08/21/14  ED Influenza     Status: None   Collection Time: 08/21/14  4:53 PM  Result Value Ref Range Status   Influenza A By PCR NEGATIVE NEGATIVE Final   Influenza B By PCR NEGATIVE NEGATIVE Final   H1N1 flu by pcr NOT DETECTED NOT-DETECTED Final    Comment:                  ----------------------- The Xpert Flu assay (FDA approval for nasal aspirates or washes and nasopharyngeal swab specimens), is intended as an aid in the diagnosis of influenza and should not be used as a sole basis for treatment.     Coagulation Studies:  Recent Labs  07/01/15 1655 07/03/15 0431 07/04/15 0445  LABPROT 31.1* 35.3* 38.8*  INR 2.99 3.52 3.99    Urinalysis: No results for input(s): COLORURINE, LABSPEC, PHURINE, GLUCOSEU, HGBUR, BILIRUBINUR,  KETONESUR, PROTEINUR, UROBILINOGEN, NITRITE, LEUKOCYTESUR in the last 72 hours.  Invalid input(s): APPERANCEUR    Imaging: US Renal  07/02/2015  CLINICAL DATA:  Acute renal failure EXAM: RENAL / URINARY TRACT ULTRASOUND COMPLETE COMPARISON:  None. FINDINGS: Right Kidney: Length: 15.4 cm.  No mass or hydronephrosis. Left Kidney: Length: 13.7 cm.  No mass or hydronephrosis. Bladder: Underdistended/not visualized. Additional comments: Abdominal ascites. IMPRESSION: No hydronephrosis. Bladder is not discretely visualized/underdistended. Abdominal ascites. Electronically Signed   By: Charline Bills M.D.   On: 07/02/2015 18:13     Medications:   . sodium chloride 75 mL/hr at 07/04/15 1153    . ampicillin-sulbactam (UNASYN) IV  3 g Intravenous Q6H  . cyanocobalamin  1,000 mcg Intramuscular Q30 days  . gabapentin  100 mg Oral BID  . insulin aspart  0-9 Units Subcutaneous TID WC  . levothyroxine  100 mcg Oral QAC breakfast  . PARoxetine  20 mg Oral Daily  . pravastatin  40 mg Oral QHS  . tamsulosin  0.4 mg Oral Daily  . cyanocobalamin  2,000 mcg Oral Daily   acetaminophen, alum & mag hydroxide-simeth, ammonium lactate, fluticasone, hydrOXYzine, ipratropium-albuterol, lactulose, ondansetron, oxyCODONE-acetaminophen, polyethylene glycol, salmeterol, sodium chloride  Assessment/ Plan:  59 y.o. male Caucasian male with bilateral pulmonary emboli, DVTs, hypercoagulable disorder, varicose veins, morbid obesity, chronic kidney disease with baseline creatinine 1.5-1.8, chronic history failure, obstructive sleep apnea, diabetes type 2 with nephropathy, diabetic retinopathy, neuropathy, chronic atrial fibrillation, chronic lower back pain, diastolic congestive heart failure, multiple bouts of LE cellulitis  1.  Acute renal failure/CKD stage III baseline Cr 1.6.  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  Acute renal failure maybe related to diuretic usage. Creatinine down to 1.9, continue hydration for another day, follow up renal function in AM.   2.  Anemia of CKD:  Hemoglobin down to 9.4, continue to monitor, hold off on epogen.  3.  Hyponatremia:  Likely related to diuretics. Sodium fluctuating, continue IVF hydration with NS, Na currently 132.      LOS: 3 Elayah Klooster 10/13/20164:41 PM

## 2015-07-05 LAB — CBC WITH DIFFERENTIAL/PLATELET
Basophils Absolute: 0 10*3/uL (ref 0–0.1)
Basophils Relative: 0 %
EOS PCT: 0 %
Eosinophils Absolute: 0.1 10*3/uL (ref 0–0.7)
HEMATOCRIT: 29.8 % — AB (ref 40.0–52.0)
Hemoglobin: 9.6 g/dL — ABNORMAL LOW (ref 13.0–18.0)
LYMPHS ABS: 0.8 10*3/uL — AB (ref 1.0–3.6)
LYMPHS PCT: 5 %
MCH: 25.3 pg — AB (ref 26.0–34.0)
MCHC: 32.2 g/dL (ref 32.0–36.0)
MCV: 78.7 fL — AB (ref 80.0–100.0)
MONO ABS: 1.1 10*3/uL — AB (ref 0.2–1.0)
MONOS PCT: 6 %
Neutro Abs: 16.2 10*3/uL — ABNORMAL HIGH (ref 1.4–6.5)
Neutrophils Relative %: 89 %
PLATELETS: 299 10*3/uL (ref 150–440)
RBC: 3.79 MIL/uL — ABNORMAL LOW (ref 4.40–5.90)
RDW: 18.1 % — AB (ref 11.5–14.5)
WBC: 18.2 10*3/uL — ABNORMAL HIGH (ref 3.8–10.6)

## 2015-07-05 LAB — BASIC METABOLIC PANEL
Anion gap: 10 (ref 5–15)
BUN: 56 mg/dL — AB (ref 6–20)
CHLORIDE: 101 mmol/L (ref 101–111)
CO2: 23 mmol/L (ref 22–32)
CREATININE: 1.88 mg/dL — AB (ref 0.61–1.24)
Calcium: 8.1 mg/dL — ABNORMAL LOW (ref 8.9–10.3)
GFR calc Af Amer: 43 mL/min — ABNORMAL LOW (ref >60)
GFR calc non Af Amer: 37 mL/min — ABNORMAL LOW (ref >60)
Glucose, Bld: 193 mg/dL — ABNORMAL HIGH (ref 65–99)
POTASSIUM: 4.3 mmol/L (ref 3.5–5.1)
Sodium: 134 mmol/L — ABNORMAL LOW (ref 135–145)

## 2015-07-05 LAB — GLUCOSE, CAPILLARY
Glucose-Capillary: 185 mg/dL — ABNORMAL HIGH (ref 65–99)
Glucose-Capillary: 219 mg/dL — ABNORMAL HIGH (ref 65–99)

## 2015-07-05 LAB — PROTIME-INR
INR: 4.08
Prothrombin Time: 39.5 seconds — ABNORMAL HIGH (ref 11.4–15.0)

## 2015-07-05 MED ORDER — VITAMIN K1 10 MG/ML IJ SOLN
5.0000 mg | Freq: Once | INTRAMUSCULAR | Status: AC
  Administered 2015-07-05: 5 mg via SUBCUTANEOUS
  Filled 2015-07-05: qty 0.5

## 2015-07-05 MED ORDER — AMMONIUM LACTATE 12 % EX LOTN
TOPICAL_LOTION | CUTANEOUS | Status: AC | PRN
Start: 2015-07-05 — End: ?

## 2015-07-05 MED ORDER — CEFUROXIME AXETIL 250 MG PO TABS
500.0000 mg | ORAL_TABLET | Freq: Two times a day (BID) | ORAL | Status: DC
  Administered 2015-07-05: 500 mg via ORAL
  Filled 2015-07-05: qty 2

## 2015-07-05 MED ORDER — OXYCODONE-ACETAMINOPHEN 5-325 MG PO TABS
2.0000 | ORAL_TABLET | Freq: Four times a day (QID) | ORAL | Status: DC | PRN
Start: 2015-07-05 — End: 2015-08-29

## 2015-07-05 MED ORDER — CEFUROXIME AXETIL 500 MG PO TABS: 500.0000 mg | ORAL_TABLET | Freq: Two times a day (BID) | ORAL | Status: DC

## 2015-07-05 NOTE — Discharge Instructions (Signed)
Home health

## 2015-07-05 NOTE — Care Management (Signed)
[facility] and was not able to keep apointment because could not pay the copay up front.  Spoke with Surgery Dept manager and informed that patient will not have to pay the copay at the time of the appointment.  Informed Mrs [name]

## 2015-07-05 NOTE — Progress Notes (Signed)
Pt discharged home via wheelchair, wife at side. IV discontinued without incident. Home medication list, prescriptions and follow-up appointment information given. Reviewed home med list with patient. No questions verbalized. Written information given on new prescriptions.

## 2015-07-05 NOTE — Care Management (Signed)
Patient adamantly declines any home health follow up.  Notified [facility] of discharge and the need to follow closely

## 2015-07-05 NOTE — Discharge Summary (Signed)
Tyler [name], is a 59 y.o. male  DOB 1956/03/15  MRN [id number].  Admission date:  07/01/2015  Admitting Physician  Altamese Dilling, MD  Discharge Date:  07/05/2015   Primary MD  SPARKS,JEFFREY D, MD  Recommendations for primary care physician for things to follow:      Admission Diagnosis  Swelling [R60.9] Cellulitis of left lower extremity [L03.116]   Discharge Diagnosis  Swelling [R60.9] Cellulitis of left lower extremity [L03.116]  Acute on chronic reanl failure, chronic diastolic CHF  Principal Problem:   Cellulitis of leg, left Active Problems:   Acute on chronic renal failure (HCC)   Hyperkalemia   Cellulitis   Pressure ulcer      Past Medical History  Diagnosis Date  . Cataract   . CHF (congestive heart failure) (HCC)   . COPD (chronic obstructive pulmonary disease) (HCC)   . Diabetes mellitus without complication (HCC)   . Sleep apnea   . Oxygen deficiency   . Thyroid disease   . Gout   . Hypothyroidism   . CKD (chronic kidney disease) stage 3, GFR 30-59 ml/min   . Asthma   . Hypertension     Past Surgical History  Procedure Laterality Date  . Cholecystectomy      2002  . Eye surgery      right cataract removed 3/21  . Vena cava filter placement         History of present illness and  Hospital Course:     Kindly see H&P for history of present illness and admission details, please review complete Labs, Consult reports and Test reports for all details in brief  HPI  from the history and physical done on the day of admission    Hospital Course    Pt admitted with LLE celulitis and acute on chronic renal failure. Started on IV ABX. Seen by Nephrology. Pain and erythema improved with IV ABX. Renal fxn improved with IV fluids. Pt refused CPAP and PT while in hospital. Refuses placement. Requesting D/C home today.   Discharge Condition:  stable   Follow UP  Follow-up Information    Follow up with SPARKS,JEFFREY D, MD In 1 week.   Specialty:  Internal Medicine   Contact information:   [address] [facility] [place] [address] [phone]         Discharge Instructions  and  Discharge Medications        Medication List    STOP taking these medications        furosemide 80 MG tablet  Commonly known as:  LASIX     metolazone 5 MG tablet  Commonly known as:  ZAROXOLYN     potassium chloride SA 20 MEQ tablet  Commonly known as:  K-DUR,KLOR-CON     sodium chloride 1 G tablet      TAKE these medications        acetaminophen 500 MG tablet  Commonly known as:  TYLENOL  Take 1,000 mg by mouth every 6 (six)  hours as needed for mild pain or headache.     alum & mag hydroxide-simeth 200-200-20 MG/5ML suspension  Commonly known as:  MAALOX/MYLANTA  Take 30 mLs by mouth every 4 (four) hours as needed for indigestion or heartburn.     ammonium lactate 12 % lotion  Commonly known as:  LAC-HYDRIN  Apply topically as needed for dry skin.     cefUROXime 500 MG tablet  Commonly known as:  CEFTIN  Take 1 tablet (500 mg total) by mouth 2 (two) times daily with a meal.     colchicine 0.6 MG tablet  Take 0.6 mg by mouth as needed (for gout flares).     cyanocobalamin 2000 MCG tablet  Take 2,000 mcg by mouth daily.     cyanocobalamin 1000 MCG/ML injection  Commonly known as:  (VITAMIN B-12)  Inject 1,000 mcg into the muscle every 30 (thirty) days.     fluticasone 50 MCG/ACT nasal spray  Commonly known as:  FLONASE  Place 2 sprays into both nostrils daily as needed for rhinitis.     gabapentin 100 MG capsule  Commonly known as:  NEURONTIN  Take 100 mg by mouth 2 (two) times daily.     hydrOXYzine 25 MG tablet  Commonly known as:  ATARAX/VISTARIL  Take 25-50 mg by mouth 3 (three) times daily as needed for itching.     insulin aspart 100 UNIT/ML injection  Commonly known as:   novoLOG  Inject 0-5 Units into the skin at bedtime.     insulin aspart 100 UNIT/ML injection  Commonly known as:  novoLOG  Inject 0-9 Units into the skin 3 (three) times daily with meals.     insulin NPH Human 100 UNIT/ML injection  Commonly known as:  HUMULIN N,NOVOLIN N  Inject into the skin as needed (for high blood sugar). Pt states that he uses per sliding scale.     insulin regular 100 units/mL injection  Commonly known as:  NOVOLIN R,HUMULIN R  Inject into the skin as needed for high blood sugar. Pt states that he uses per sliding scale.     ipratropium-albuterol 0.5-2.5 (3) MG/3ML Soln  Commonly known as:  DUONEB  Take 3 mLs by nebulization every 4 (four) hours as needed.     lactulose 10 GM/15ML solution  Commonly known as:  CHRONULAC  Take 20 g by mouth 2 (two) times daily as needed for mild constipation.     levothyroxine 100 MCG tablet  Commonly known as:  SYNTHROID, LEVOTHROID  Take 100 mcg by mouth daily.     ondansetron 4 MG tablet  Commonly known as:  ZOFRAN  Take 1 tablet (4 mg total) by mouth every 6 (six) hours as needed for nausea.     oxyCODONE-acetaminophen 5-325 MG tablet  Commonly known as:  PERCOCET/ROXICET  Take 2 tablets by mouth every 6 (six) hours as needed for moderate pain.     PARoxetine 20 MG tablet  Commonly known as:  PAXIL  Take 20 mg by mouth daily.     polyethylene glycol packet  Commonly known as:  MIRALAX / GLYCOLAX  Take 17 g by mouth daily as needed for mild constipation.     pravastatin 40 MG tablet  Commonly known as:  PRAVACHOL  Take 40 mg by mouth at bedtime.     salmeterol 50 MCG/DOSE diskus inhaler  Commonly known as:  SEREVENT  Inhale 1 puff into the lungs 2 (two) times daily as needed (for shortness of breath).  spironolactone 25 MG tablet  Commonly known as:  ALDACTONE  Take 25 mg by mouth 2 (two) times daily.     tamsulosin 0.4 MG Caps capsule  Commonly known as:  FLOMAX  Take 1 capsule (0.4 mg total) by  mouth daily.     torsemide 100 MG tablet  Commonly known as:  DEMADEX  Take 50 mg by mouth daily.     warfarin 10 MG tablet  Commonly known as:  COUMADIN  Take 5-10 mg by mouth every evening. Pt takes  on Tuesday, Wednesday, Thursday, Saturday, and Sunday.   Pt takes  on Monday and Friday.          Diet and Activity recommendation: See Discharge Instructions above   Consults obtained - Nephrology, CM, CSW, PT   Major procedures and Radiology Reports - PLEASE review detailed and final reports for all details, in brief -   See below   US Renal  07/02/2015  CLINICAL DATA:  Acute renal failure EXAM: RENAL / URINARY TRACT ULTRASOUND COMPLETE COMPARISON:  None. FINDINGS: Right Kidney: Length: 15.4 cm.  No mass or hydronephrosis. Left Kidney: Length: 13.7 cm.  No mass or hydronephrosis. Bladder: Underdistended/not visualized. Additional comments: Abdominal ascites. IMPRESSION: No hydronephrosis. Bladder is not discretely visualized/underdistended. Abdominal ascites. Electronically Signed   By: Charline Bills M.D.   On: 07/02/2015 18:13   US Venous Img Lower Unilateral Left  07/02/2015  CLINICAL DATA:  Left lower extremity pain and swelling for the past 4-5 days. History of smoking. History of prior DVT and IVC filter placement. Evaluate for acute or chronic DVT. EXAM: LEFT LOWER EXTREMITY VENOUS DOPPLER ULTRASOUND TECHNIQUE: Gray-scale sonography with graded compression, as well as color Doppler and duplex ultrasound were performed to evaluate the lower extremity deep venous systems from the level of the common femoral vein and including the common femoral, femoral, profunda femoral, popliteal and calf veins including the posterior tibial, peroneal and gastrocnemius veins when visible. The superficial great saphenous vein was also interrogated. Spectral Doppler was utilized to evaluate flow at rest and with distal augmentation maneuvers in the common femoral, femoral and popliteal  veins. COMPARISON:  Bilateral lower extremity venous Doppler ultrasound - 10/30/2013 FINDINGS: Contralateral Common Femoral Vein: Respiratory phasicity is normal and symmetric with the symptomatic side. No evidence of thrombus. Normal compressibility. Common Femoral Vein: No evidence of thrombus. Normal compressibility, respiratory phasicity and response to augmentation. Saphenofemoral Junction: No evidence of thrombus. Normal compressibility and flow on color Doppler imaging. Profunda Femoral Vein: No evidence of thrombus. Normal compressibility and flow on color Doppler imaging. Femoral Vein: No evidence of thrombus. Normal compressibility, respiratory phasicity and response to augmentation. Popliteal Vein: No evidence of thrombus. Normal compressibility, respiratory phasicity and response to augmentation. Calf Veins: No evidence of thrombus. Normal compressibility and flow on color Doppler imaging. Superficial Great Saphenous Vein: No evidence of thrombus. Normal compressibility and flow on color Doppler imaging. Venous Reflux:  None. Other Findings: Note is made of a benign appearing right inguinal lymph node which measures approximately 1 cm in greatest short axis diameter and maintains a benign fatty hila. Nonspecific prominent vascularity is noted about the hila of this lymph node. IMPRESSION: No evidence of acute or chronic DVT within the left lower extremity. Electronically Signed   By: Simonne Come M.D.   On: 07/02/2015 12:30    Micro Results   See below  No results found for this or any previous visit (from the past 240 hour(s)).  Today   Subjective:   Peggye PittMichael Lotts today has no headache,no chest abdominal pain,no new weakness tingling or numbness, feels much better wants to go home today.   Objective:   Blood pressure 111/67, pulse 80, temperature 98 F (36.7 C), temperature source Oral, resp. rate 20, height 6' (1.829 m), weight 139.935 kg (308 lb 8 oz), SpO2 96  %.   Intake/Output Summary (Last 24 hours) at 07/05/15 0713 Last data filed at 07/04/15 2228  Gross per 24 hour  Intake    270 ml  Output    500 ml  Net   -230 ml    Exam Awake Alert, Oriented x 3, No new F.N deficits, Normal affect [place].AT,PERRAL Supple Neck,No JVD, No cervical lymphadenopathy appriciated.  Symmetrical Chest wall movement, Good air movement bilaterally, basilar rales Irregularly irregular,No Gallops,Rubs or new Murmurs, No Parasternal Heave +ve B.Sounds, Abd Soft, Non tender, No organomegaly appriciated, No rebound -guarding or rigidity. No Cyanosis, Clubbing.3+ edema with erythema to LLE noted, No new Rash or bruise  Data Review   CBC w Diff: Lab Results  Component Value Date   WBC 18.2* 07/05/2015   WBC 5.8 10/29/2014   HGB 9.6* 07/05/2015   HGB 12.9* 10/29/2014   HCT 29.8* 07/05/2015   HCT 40.0 10/29/2014   PLT 299 07/05/2015   PLT 221 10/29/2014   LYMPHOPCT 5 07/05/2015   LYMPHOPCT 19.5 10/29/2014   MONOPCT 6 07/05/2015   MONOPCT 8.5 10/29/2014   EOSPCT 0 07/05/2015   EOSPCT 2.2 10/29/2014   BASOPCT 0 07/05/2015   BASOPCT 1.1 10/29/2014    CMP: Lab Results  Component Value Date   NA 134* 07/05/2015   NA 136 08/21/2014   K 4.3 07/05/2015   K 3.7 08/21/2014   CL 101 07/05/2015   CL 98 08/21/2014   CO2 23 07/05/2015   CO2 30 08/21/2014   BUN 56* 07/05/2015   BUN 25* 08/21/2014   CREATININE 1.88* 07/05/2015   CREATININE 1.73* 08/21/2014   PROT 6.5 07/03/2015   PROT 7.2 08/21/2014   ALBUMIN 2.1* 07/03/2015   ALBUMIN 2.8* 08/21/2014   BILITOT 1.1 07/03/2015   BILITOT 1.2* 08/21/2014   ALKPHOS 307* 07/03/2015   ALKPHOS 330* 08/21/2014   AST 21 07/03/2015   AST 25 08/21/2014   ALT 10* 07/03/2015   ALT 13* 08/21/2014  .   Total Time in preparing paper work, data evaluation and todays exam - 45 minutes  SPARKS,JEFFREY D M.D on 07/05/2015 at 7:13 AM

## 2015-07-05 NOTE — Care Management Important Message (Signed)
Important Message  Patient Details  Name: Tyler [name] MRN: [id number] Date of Birth: [birth year]   Medicare Important Message Given:  Yes-third notification given    Tyler MessierKathy A [name] 07/05/2015, 10:00 AM

## 2015-07-05 NOTE — Progress Notes (Signed)
Pt refused to wear C-pap.

## 2015-07-05 NOTE — Progress Notes (Signed)
MD notified. Pts INR is 4.08 orders to give 5mg  Vit K before discharge. Will continue to monitor.

## 2015-07-08 ENCOUNTER — Other Ambulatory Visit: Payer: Self-pay | Admitting: *Deleted

## 2015-07-08 NOTE — Patient Outreach (Signed)
Transition of care call (discharged 10/14):  Pt reports legs killing me, swelling in left leg, gave him antibiotic (post hospitalization).  Pt states his problem is he does not have a recliner, [facility] into [employer] [employer] see if can donate one.  RN CM suggested calling Hospice.  RN CM also spoke with spouse Jasmine DecemberSharon who reports legs don't look better, still red, to f/u with Dr. Judithann SheenSparks 10/24.  Spouse states still need to get prescription cream that was called into the pharmacy, pt taking antibiotic as ordered.  Spouse pt's weight is back up due to all the fluids he received in the hospital, pt compliant with Low Na+ diet.   RN CM discussed with spouse calling MD if not improvement seen in legs to which she said she is going to give it to the weekend, give the medication a chance to work, if no improvement will call MD.   RN CM discussed doing a home visit to which spouse agreed, home visit scheduled for 10/20.   Plan to f/u with pt on 10/20- home visit.     Shayne Alkenose M.   Olisa Quesnel RN CCM [facility]  [phone]

## 2015-07-11 ENCOUNTER — Inpatient Hospital Stay
Admission: EM | Admit: 2015-07-11 | Discharge: 2015-07-17 | DRG: 602 | Disposition: A | Discharge status: Home / Self Care | Payer: Commercial Managed Care - HMO | Attending: Internal Medicine | Admitting: Internal Medicine

## 2015-07-11 ENCOUNTER — Other Ambulatory Visit: Payer: Self-pay

## 2015-07-11 ENCOUNTER — Encounter: Payer: Self-pay | Admitting: Emergency Medicine

## 2015-07-11 ENCOUNTER — Other Ambulatory Visit: Payer: Self-pay | Admitting: *Deleted

## 2015-07-11 ENCOUNTER — Emergency Department: Payer: Commercial Managed Care - HMO

## 2015-07-11 ENCOUNTER — Ambulatory Visit: Payer: Commercial Managed Care - HMO | Admitting: *Deleted

## 2015-07-11 DIAGNOSIS — J449 Chronic obstructive pulmonary disease, unspecified: Secondary | ICD-10-CM | POA: Diagnosis present

## 2015-07-11 DIAGNOSIS — R791 Abnormal coagulation profile: Secondary | ICD-10-CM

## 2015-07-11 DIAGNOSIS — Z794 Long term (current) use of insulin: Secondary | ICD-10-CM | POA: Diagnosis not present

## 2015-07-11 DIAGNOSIS — I5033 Acute on chronic diastolic (congestive) heart failure: Secondary | ICD-10-CM | POA: Diagnosis present

## 2015-07-11 DIAGNOSIS — Z7901 Long term (current) use of anticoagulants: Secondary | ICD-10-CM | POA: Diagnosis not present

## 2015-07-11 DIAGNOSIS — F17200 Nicotine dependence, unspecified, uncomplicated: Secondary | ICD-10-CM | POA: Diagnosis present

## 2015-07-11 DIAGNOSIS — Z6841 Body Mass Index (BMI) 40.0 and over, adult: Secondary | ICD-10-CM

## 2015-07-11 DIAGNOSIS — L03116 Cellulitis of left lower limb: Secondary | ICD-10-CM | POA: Diagnosis present

## 2015-07-11 DIAGNOSIS — E1122 Type 2 diabetes mellitus with diabetic chronic kidney disease: Secondary | ICD-10-CM | POA: Diagnosis present

## 2015-07-11 DIAGNOSIS — B9789 Other viral agents as the cause of diseases classified elsewhere: Secondary | ICD-10-CM | POA: Diagnosis present

## 2015-07-11 DIAGNOSIS — N183 Chronic kidney disease, stage 3 (moderate): Secondary | ICD-10-CM | POA: Diagnosis present

## 2015-07-11 DIAGNOSIS — I130 Hypertensive heart and chronic kidney disease with heart failure and stage 1 through stage 4 chronic kidney disease, or unspecified chronic kidney disease: Secondary | ICD-10-CM | POA: Diagnosis present

## 2015-07-11 DIAGNOSIS — E871 Hypo-osmolality and hyponatremia: Secondary | ICD-10-CM | POA: Diagnosis present

## 2015-07-11 DIAGNOSIS — R042 Hemoptysis: Secondary | ICD-10-CM | POA: Diagnosis present

## 2015-07-11 LAB — BASIC METABOLIC PANEL
ANION GAP: 12 (ref 5–15)
BUN: 59 mg/dL — ABNORMAL HIGH (ref 6–20)
CHLORIDE: 94 mmol/L — AB (ref 101–111)
CO2: 22 mmol/L (ref 22–32)
Calcium: 8.2 mg/dL — ABNORMAL LOW (ref 8.9–10.3)
Creatinine, Ser: 1.9 mg/dL — ABNORMAL HIGH (ref 0.61–1.24)
GFR calc Af Amer: 43 mL/min — ABNORMAL LOW (ref >60)
GFR, EST NON AFRICAN AMERICAN: 37 mL/min — AB (ref >60)
Glucose, Bld: 204 mg/dL — ABNORMAL HIGH (ref 65–99)
POTASSIUM: 4.1 mmol/L (ref 3.5–5.1)
SODIUM: 128 mmol/L — AB (ref 135–145)

## 2015-07-11 LAB — CBC
HCT: 31 % — ABNORMAL LOW (ref 40.0–52.0)
HEMOGLOBIN: 9.8 g/dL — AB (ref 13.0–18.0)
MCH: 25.1 pg — ABNORMAL LOW (ref 26.0–34.0)
MCHC: 31.7 g/dL — ABNORMAL LOW (ref 32.0–36.0)
MCV: 79.2 fL — AB (ref 80.0–100.0)
PLATELETS: 336 10*3/uL (ref 150–440)
RBC: 3.91 MIL/uL — AB (ref 4.40–5.90)
RDW: 18.5 % — ABNORMAL HIGH (ref 11.5–14.5)
WBC: 10.5 10*3/uL (ref 3.8–10.6)

## 2015-07-11 LAB — ABO/RH: ABO/RH(D): A POS

## 2015-07-11 LAB — TYPE AND SCREEN
ABO/RH(D): A POS
Antibody Screen: NEGATIVE

## 2015-07-11 LAB — BRAIN NATRIURETIC PEPTIDE: B Natriuretic Peptide: 1335 pg/mL — ABNORMAL HIGH (ref 0.0–100.0)

## 2015-07-11 LAB — GLUCOSE, CAPILLARY: Glucose-Capillary: 199 mg/dL — ABNORMAL HIGH (ref 65–99)

## 2015-07-11 LAB — PROTIME-INR
INR: 5.35 — AB
Prothrombin Time: 48.7 seconds — ABNORMAL HIGH (ref 11.4–15.0)

## 2015-07-11 LAB — APTT: APTT: 54 s — AB (ref 24–36)

## 2015-07-11 LAB — TROPONIN I: TROPONIN I: 0.04 ng/mL — AB (ref <0.031)

## 2015-07-11 MED ORDER — FUROSEMIDE 10 MG/ML IJ SOLN
40.0000 mg | Freq: Two times a day (BID) | INTRAMUSCULAR | Status: DC
  Administered 2015-07-11 – 2015-07-16 (×10): 40 mg via INTRAVENOUS
  Filled 2015-07-11 (×11): qty 4

## 2015-07-11 MED ORDER — ACETAMINOPHEN 650 MG RE SUPP: 650.0000 mg | Freq: Four times a day (QID) | RECTAL | Status: DC | PRN

## 2015-07-11 MED ORDER — CLINDAMYCIN PHOSPHATE 900 MG/50ML IV SOLN
900.0000 mg | Freq: Once | INTRAVENOUS | Status: AC
  Administered 2015-07-11: 900 mg via INTRAVENOUS
  Filled 2015-07-11: qty 50

## 2015-07-11 MED ORDER — INSULIN ASPART 100 UNIT/ML ~~LOC~~ SOLN
0.0000 [IU] | Freq: Every day | SUBCUTANEOUS | Status: DC
  Administered 2015-07-12: 2 [IU] via SUBCUTANEOUS
  Filled 2015-07-11: qty 2

## 2015-07-11 MED ORDER — LACTULOSE 10 GM/15ML PO SOLN: 20.0000 g | Freq: Two times a day (BID) | ORAL | Status: DC | PRN

## 2015-07-11 MED ORDER — ACETAMINOPHEN 325 MG PO TABS
650.0000 mg | ORAL_TABLET | Freq: Four times a day (QID) | ORAL | Status: DC | PRN
  Administered 2015-07-12: 650 mg via ORAL
  Filled 2015-07-11 (×2): qty 2

## 2015-07-11 MED ORDER — VANCOMYCIN HCL IN DEXTROSE 1-5 GM/200ML-% IV SOLN: 1000.0000 mg | Freq: Two times a day (BID) | INTRAVENOUS | Status: DC

## 2015-07-11 MED ORDER — FLUTICASONE PROPIONATE 50 MCG/ACT NA SUSP
2.0000 | Freq: Every day | NASAL | Status: DC | PRN
  Administered 2015-07-12 – 2015-07-17 (×4): 2 via NASAL
  Filled 2015-07-11: qty 16

## 2015-07-11 MED ORDER — VANCOMYCIN HCL 10 G IV SOLR
1250.0000 mg | INTRAVENOUS | Status: AC
  Administered 2015-07-11: 1250 mg via INTRAVENOUS
  Filled 2015-07-11 (×2): qty 1250

## 2015-07-11 MED ORDER — LEVOTHYROXINE SODIUM 50 MCG PO TABS: 100.0000 ug | ORAL_TABLET | Freq: Every day | ORAL | Status: DC

## 2015-07-11 MED ORDER — LEVOTHYROXINE SODIUM 50 MCG PO TABS
100.0000 ug | ORAL_TABLET | Freq: Every day | ORAL | Status: DC
  Administered 2015-07-12 – 2015-07-17 (×6): 100 ug via ORAL
  Filled 2015-07-11 (×6): qty 2

## 2015-07-11 MED ORDER — INSULIN ASPART 100 UNIT/ML ~~LOC~~ SOLN
0.0000 [IU] | Freq: Three times a day (TID) | SUBCUTANEOUS | Status: DC
  Administered 2015-07-12 (×2): 2 [IU] via SUBCUTANEOUS
  Administered 2015-07-12: 3 [IU] via SUBCUTANEOUS
  Administered 2015-07-13 – 2015-07-14 (×6): 2 [IU] via SUBCUTANEOUS
  Administered 2015-07-15: 3 [IU] via SUBCUTANEOUS
  Administered 2015-07-15: 2 [IU] via SUBCUTANEOUS
  Administered 2015-07-16: 3 [IU] via SUBCUTANEOUS
  Administered 2015-07-16: 2 [IU] via SUBCUTANEOUS
  Administered 2015-07-16: 5 [IU] via SUBCUTANEOUS
  Administered 2015-07-17 (×2): 2 [IU] via SUBCUTANEOUS
  Filled 2015-07-11 (×2): qty 2
  Filled 2015-07-11: qty 5
  Filled 2015-07-11 (×5): qty 2
  Filled 2015-07-11: qty 3
  Filled 2015-07-11 (×4): qty 2
  Filled 2015-07-11: qty 3
  Filled 2015-07-11 (×2): qty 2

## 2015-07-11 MED ORDER — PRAVASTATIN SODIUM 40 MG PO TABS
40.0000 mg | ORAL_TABLET | Freq: Every day | ORAL | Status: DC
  Administered 2015-07-11 – 2015-07-16 (×6): 40 mg via ORAL
  Filled 2015-07-11 (×6): qty 1

## 2015-07-11 MED ORDER — POLYETHYLENE GLYCOL 3350 17 G PO PACK: 17.0000 g | PACK | Freq: Every day | ORAL | Status: DC | PRN

## 2015-07-11 MED ORDER — ALUM & MAG HYDROXIDE-SIMETH 200-200-20 MG/5ML PO SUSP: 30.0000 mL | ORAL | Status: DC | PRN

## 2015-07-11 MED ORDER — HYDROMORPHONE HCL 1 MG/ML IJ SOLN
1.0000 mg | Freq: Once | INTRAMUSCULAR | Status: AC
  Administered 2015-07-11: 1 mg via INTRAVENOUS
  Filled 2015-07-11: qty 1

## 2015-07-11 MED ORDER — HYDROCODONE-ACETAMINOPHEN 5-325 MG PO TABS
1.0000 | ORAL_TABLET | ORAL | Status: DC | PRN
  Administered 2015-07-12 – 2015-07-15 (×7): 2 via ORAL
  Administered 2015-07-15: 1 via ORAL
  Administered 2015-07-16: 2 via ORAL
  Administered 2015-07-16: 1 via ORAL
  Administered 2015-07-16 – 2015-07-17 (×2): 2 via ORAL
  Filled 2015-07-11 (×10): qty 2
  Filled 2015-07-11: qty 1
  Filled 2015-07-11: qty 2
  Filled 2015-07-11: qty 1

## 2015-07-11 MED ORDER — TORSEMIDE 100 MG PO TABS
50.0000 mg | ORAL_TABLET | Freq: Every day | ORAL | Status: DC
Start: 2015-07-11 — End: 2015-07-17
  Administered 2015-07-12 – 2015-07-17 (×6): 50 mg via ORAL
  Filled 2015-07-11 (×6): qty 1

## 2015-07-11 MED ORDER — GABAPENTIN 100 MG PO CAPS
100.0000 mg | ORAL_CAPSULE | Freq: Two times a day (BID) | ORAL | Status: DC
  Administered 2015-07-11 – 2015-07-17 (×12): 100 mg via ORAL
  Filled 2015-07-11 (×12): qty 1

## 2015-07-11 MED ORDER — VANCOMYCIN HCL IN DEXTROSE 1-5 GM/200ML-% IV SOLN
1000.0000 mg | Freq: Two times a day (BID) | INTRAVENOUS | Status: DC
  Administered 2015-07-11 – 2015-07-12 (×2): 1000 mg via INTRAVENOUS
  Filled 2015-07-11 (×3): qty 200

## 2015-07-11 MED ORDER — IPRATROPIUM-ALBUTEROL 0.5-2.5 (3) MG/3ML IN SOLN
3.0000 mL | RESPIRATORY_TRACT | Status: DC | PRN
  Administered 2015-07-12: 3 mL via RESPIRATORY_TRACT
  Filled 2015-07-11: qty 3

## 2015-07-11 MED ORDER — PAROXETINE HCL 10 MG PO TABS
20.0000 mg | ORAL_TABLET | Freq: Every day | ORAL | Status: DC
  Administered 2015-07-12 – 2015-07-17 (×6): 20 mg via ORAL
  Filled 2015-07-11 (×6): qty 2

## 2015-07-11 MED ORDER — PIPERACILLIN-TAZOBACTAM 4.5 G IVPB
4.5000 g | Freq: Three times a day (TID) | INTRAVENOUS | Status: DC
  Administered 2015-07-11 – 2015-07-12 (×3): 4.5 g via INTRAVENOUS
  Filled 2015-07-11 (×4): qty 100

## 2015-07-11 MED ORDER — SODIUM CHLORIDE 0.9 % IV SOLN: 250.0000 mL | INTRAVENOUS | Status: DC | PRN

## 2015-07-11 MED ORDER — SALMETEROL XINAFOATE 50 MCG/DOSE IN AEPB
1.0000 | INHALATION_SPRAY | Freq: Two times a day (BID) | RESPIRATORY_TRACT | Status: DC | PRN
  Filled 2015-07-11: qty 0

## 2015-07-11 MED ORDER — TAMSULOSIN HCL 0.4 MG PO CAPS
0.4000 mg | ORAL_CAPSULE | Freq: Every day | ORAL | Status: DC
  Administered 2015-07-12 – 2015-07-17 (×6): 0.4 mg via ORAL
  Filled 2015-07-11 (×6): qty 1

## 2015-07-11 MED ORDER — PIPERACILLIN-TAZOBACTAM 4.5 G IVPB
4.5000 g | Freq: Three times a day (TID) | INTRAVENOUS | Status: DC
  Filled 2015-07-11 (×3): qty 100

## 2015-07-11 MED ORDER — HYDROXYZINE HCL 50 MG PO TABS: 25.0000 mg | ORAL_TABLET | Freq: Three times a day (TID) | ORAL | Status: DC | PRN

## 2015-07-11 MED ORDER — DOCUSATE SODIUM 100 MG PO CAPS
100.0000 mg | ORAL_CAPSULE | Freq: Two times a day (BID) | ORAL | Status: DC
  Administered 2015-07-12 – 2015-07-16 (×3): 100 mg via ORAL
  Filled 2015-07-11 (×12): qty 1

## 2015-07-11 MED ORDER — SPIRONOLACTONE 25 MG PO TABS
25.0000 mg | ORAL_TABLET | Freq: Two times a day (BID) | ORAL | Status: DC
  Administered 2015-07-11 – 2015-07-17 (×12): 25 mg via ORAL
  Filled 2015-07-11 (×12): qty 1

## 2015-07-11 MED ORDER — ALBUTEROL SULFATE (2.5 MG/3ML) 0.083% IN NEBU: 2.5000 mg | INHALATION_SOLUTION | RESPIRATORY_TRACT | Status: DC | PRN

## 2015-07-11 MED ORDER — SODIUM CHLORIDE 0.9 % IJ SOLN
3.0000 mL | Freq: Two times a day (BID) | INTRAMUSCULAR | Status: DC
  Administered 2015-07-11 – 2015-07-16 (×10): 3 mL via INTRAVENOUS

## 2015-07-11 MED ORDER — FUROSEMIDE 10 MG/ML IJ SOLN
40.0000 mg | Freq: Once | INTRAMUSCULAR | Status: AC
  Administered 2015-07-11: 40 mg via INTRAVENOUS
  Filled 2015-07-11: qty 4

## 2015-07-11 MED ORDER — ONDANSETRON HCL 4 MG/2ML IJ SOLN: 4.0000 mg | Freq: Four times a day (QID) | INTRAMUSCULAR | Status: DC | PRN

## 2015-07-11 MED ORDER — AMMONIUM LACTATE 12 % EX LOTN
TOPICAL_LOTION | CUTANEOUS | Status: DC | PRN
  Filled 2015-07-11: qty 400

## 2015-07-11 MED ORDER — SODIUM CHLORIDE 0.9 % IJ SOLN: 3.0000 mL | INTRAMUSCULAR | Status: DC | PRN

## 2015-07-11 MED ORDER — ONDANSETRON HCL 4 MG PO TABS: 4.0000 mg | ORAL_TABLET | Freq: Four times a day (QID) | ORAL | Status: DC | PRN

## 2015-07-11 MED ORDER — PIPERACILLIN-TAZOBACTAM 4.5 G IVPB
4.5000 g | INTRAVENOUS | Status: DC
  Filled 2015-07-11: qty 100

## 2015-07-11 MED ORDER — CETYLPYRIDINIUM CHLORIDE 0.05 % MT LIQD
7.0000 mL | Freq: Two times a day (BID) | OROMUCOSAL | Status: DC
  Administered 2015-07-11 – 2015-07-16 (×7): 7 mL via OROMUCOSAL

## 2015-07-11 NOTE — ED Provider Notes (Signed)
[facility] Emergency Department Provider Note  ____________________________________________  Time seen: Approximately 12:12 PM  I have reviewed the triage vital signs and the nursing notes.   HISTORY  Chief Complaint Hemoptysis    HPI Tyler [name] is a 59 y.o. male with a history of COPD on 2.5 L nasal cannula at baseline, A. fib and CHF on Coumadin, DM 2, presenting with left lower extremity swelling, erythema, and one episode of hemoptysis. Patient was admitted from 10/12-10/14 for left lower extremity cellulitis and discharged home on oral antibiotics. Since then the erythema has spread now going into the medial thigh and into the groin. He has associated pain and swelling with this. He denies any fever, chills, nausea or vomiting. He did have 1 episode today of a small amount of blood streak in his mucus when he coughed. He has not been having an increasing cough, shortness of breath, or chest pain.   Past Medical History  Diagnosis Date  . Cataract   . CHF (congestive heart failure) (HCC)   . COPD (chronic obstructive pulmonary disease) (HCC)   . Diabetes mellitus without complication (HCC)   . Sleep apnea   . Oxygen deficiency   . Thyroid disease   . Gout   . Hypothyroidism   . CKD (chronic kidney disease) stage 3, GFR 30-59 ml/min   . Asthma   . Hypertension     Patient Active Problem List   Diagnosis Date Noted  . Pressure ulcer 07/02/2015  . Cellulitis of leg, left 07/01/2015  . Acute on chronic renal failure (HCC) 07/01/2015  . Hyperkalemia 07/01/2015  . Cellulitis 07/01/2015  . Type 2 diabetes mellitus (HCC) 05/27/2015  . Hypothyroidism 05/27/2015  . HLD (hyperlipidemia) 05/27/2015  . Acute on chronic diastolic congestive heart failure (HCC)   . Depression, major, recurrent, moderate (HCC) 04/12/2015  . CHF (congestive heart failure) (HCC) 04/10/2015  . Anasarca 04/09/2015  . COPD, moderate (HCC) 03/12/2015  . Tobacco abuse  03/12/2015  . Acute CHF (HCC) 02/16/2015  . Hyponatremia 02/16/2015  . A-fib (HCC) 02/16/2015  . Chronic respiratory failure (HCC) 02/16/2015    Past Surgical History  Procedure Laterality Date  . Cholecystectomy      2002  . Eye surgery      right cataract removed 3/21  . Vena cava filter placement      Current Outpatient Rx  Name  Route  Sig  Dispense  Refill  . acetaminophen (TYLENOL) 500 MG tablet   Oral   Take 1,000 mg by mouth every 6 (six) hours as needed for mild pain or headache.         Marland Kitchen alum & mag hydroxide-simeth (MAALOX/MYLANTA) 200-200-20 MG/5ML suspension   Oral   Take 30 mLs by mouth every 4 (four) hours as needed for indigestion or heartburn. Patient not taking: Reported on 07/08/2015   355 mL   0   . ammonium lactate (LAC-HYDRIN) 12 % lotion   Topical   Apply topically as needed for dry skin.   400 g   0   . cefUROXime (CEFTIN) 500 MG tablet   Oral   Take 1 tablet (500 mg total) by mouth 2 (two) times daily with a meal.   20 tablet   0   . colchicine 0.6 MG tablet   Oral   Take 0.6 mg by mouth as needed (for gout flares).          . cyanocobalamin (,VITAMIN B-12,) 1000 MCG/ML injection  Intramuscular   Inject 1,000 mcg into the muscle every 30 (thirty) days.         . cyanocobalamin 2000 MCG tablet   Oral   Take 2,000 mcg by mouth daily.         . fluticasone (FLONASE) 50 MCG/ACT nasal spray   Each Nare   Place 2 sprays into both nostrils daily as needed for rhinitis.         Marland Kitchen gabapentin (NEURONTIN) 100 MG capsule   Oral   Take 100 mg by mouth 2 (two) times daily.         . hydrOXYzine (ATARAX/VISTARIL) 25 MG tablet   Oral   Take 25-50 mg by mouth 3 (three) times daily as needed for itching.         . insulin aspart (NOVOLOG) 100 UNIT/ML injection   Subcutaneous   Inject 0-5 Units into the skin at bedtime.   10 mL   11   . insulin aspart (NOVOLOG) 100 UNIT/ML injection   Subcutaneous   Inject 0-9 Units into  the skin 3 (three) times daily with meals.   10 mL   11   . insulin NPH Human (HUMULIN N,NOVOLIN N) 100 UNIT/ML injection   Subcutaneous   Inject into the skin as needed (for high blood sugar). Pt states that he uses per sliding scale.         . insulin regular (NOVOLIN R,HUMULIN R) 100 units/mL injection   Subcutaneous   Inject into the skin as needed for high blood sugar. Pt states that he uses per sliding scale.         Marland Kitchen ipratropium-albuterol (DUONEB) 0.5-2.5 (3) MG/3ML SOLN   Nebulization   Take 3 mLs by nebulization every 4 (four) hours as needed. Patient taking differently: Take 3 mLs by nebulization every 4 (four) hours as needed (for shortness of breath).    360 mL   5   . lactulose (CHRONULAC) 10 GM/15ML solution   Oral   Take 20 g by mouth 2 (two) times daily as needed for mild constipation.          Marland Kitchen levothyroxine (SYNTHROID, LEVOTHROID) 100 MCG tablet   Oral   Take 100 mcg by mouth daily.         . ondansetron (ZOFRAN) 4 MG tablet   Oral   Take 1 tablet (4 mg total) by mouth every 6 (six) hours as needed for nausea. Patient not taking: Reported on 07/08/2015   20 tablet   0   . oxyCODONE-acetaminophen (PERCOCET/ROXICET) 5-325 MG tablet   Oral   Take 2 tablets by mouth every 6 (six) hours as needed for moderate pain.   30 tablet   0   . PARoxetine (PAXIL) 20 MG tablet   Oral   Take 20 mg by mouth daily.         . polyethylene glycol (MIRALAX / GLYCOLAX) packet   Oral   Take 17 g by mouth daily as needed for mild constipation.          . pravastatin (PRAVACHOL) 40 MG tablet   Oral   Take 40 mg by mouth at bedtime.         . salmeterol (SEREVENT) 50 MCG/DOSE diskus inhaler   Inhalation   Inhale 1 puff into the lungs 2 (two) times daily as needed (for shortness of breath).         Marland Kitchen spironolactone (ALDACTONE) 25 MG tablet   Oral   Take 25  mg by mouth 2 (two) times daily.         . tamsulosin (FLOMAX) 0.4 MG CAPS capsule   Oral    Take 1 capsule (0.4 mg total) by mouth daily.   30 capsule   5   . torsemide (DEMADEX) 100 MG tablet   Oral   Take 50 mg by mouth daily.         Marland Kitchen warfarin (COUMADIN) 10 MG tablet   Oral   Take 5-10 mg by mouth every evening. Pt takes  on Tuesday, Wednesday, Thursday, Saturday, and Sunday.   Pt takes  on Monday and Friday.           Allergies Enalapril; Atorvastatin; and Lovastatin  Family History  Problem Relation Age of Onset  . Diabetes Sister   . Diabetes Brother   . Deep vein thrombosis Mother   . CVA Father     Social History Social History  Substance Use Topics  . Smoking status: Current Some Day Smoker -- 0.00 packs/day for 30 years    Types: Cigars  . Smokeless tobacco: Never Used     Comment: cigar 3-4 yrs 1-3 per day/former Cigarettes  . Alcohol Use: No    Review of Systems Constitutional: No fever/chills. No syncope or lightheadedness. Positive orthopnea which is chronic.  Eyes: No visual changes. ENT: No sore throat. Cardiovascular: Denies chest pain, palpitations. Respiratory: Denies shortness of breatchronic cough. Single episode of hemoptysis. Gastrointestinal: No abdominal pain.  No nausea, no vomiting.  No diarrhea.  No constipation. Genitourinary: Negative for dysuria. Musculoskeletal: Negative for back pain.positive for left lower extremity erythema, swelling, and pain. Neurological: Negative for headaches, focal weakness or numbness.  10-point ROS otherwise negative.  ____________________________________________   PHYSICAL EXAM:  VITAL SIGNS: ED Triage Vitals  Enc Vitals Group     BP --      Pulse --      Resp --      Temp --      Temp src --      SpO2 --      Weight 07/11/15 1150 370 lb (167.831 kg)     Height 07/11/15 1150 6' (1.829 m)     Head Cir --      Peak Flow --      Pain Score 07/11/15 1152 9     Pain Loc --      Pain Edu? --      Excl. in GC? --     Constitutional:alert and oriented 3; able to answer  questions properly. Chronically ill-appearing. Eyes: conjunctivae are normal.  EOMI. Head: Atraumatic. Nose: No congestion/rhinnorhea. Mouth/Throat: Mucous membranes are moist.  Neck: No stridor.  Supple.  No JVD  Cardiovascular: Normal rate, regular rhythm. No murmurs, rubs or gallops.  Respiratory: Normal respiratory effort.  No retractions. Lungs CTAB.  No wheezes, rales or ronchi. Gastrointestinal:  obese. Soft and nontender. No distention. No peritoneal signs. Musculoskeletal: Bilateral severe pitting edema to the groin. Changes consistent with peripheral vascular disease including thickened skin and decreased hair. Left lower extremity has diffuse erythema and warmth from the foot to the knee and medially in the thigh. There is no open wound. Normal DP and PT pulse. Tender to palpation diffusely. Neurologic:  Normal speech and language. No gross focal neurologic deficits are appreciated.  Skin:  Positive rash Psychiatric: Flat affect.  Normal judgement.  ____________________________________________   LABS (all labs ordered are listed, but only abnormal results are displayed)  Labs Reviewed  CBC -  Abnormal; Notable for the following:    RBC 3.91 (*)    Hemoglobin 9.8 (*)    HCT 31.0 (*)    MCV 79.2 (*)    MCH 25.1 (*)    MCHC 31.7 (*)    RDW 18.5 (*)    All other components within normal limits  BASIC METABOLIC PANEL - Abnormal; Notable for the following:    Sodium 128 (*)    Chloride 94 (*)    Glucose, Bld 204 (*)    BUN 59 (*)    Creatinine, Ser 1.90 (*)    Calcium 8.2 (*)    GFR calc non Af Amer 37 (*)    GFR calc Af Amer 43 (*)    All other components within normal limits  APTT - Abnormal; Notable for the following:    aPTT 54 (*)    All other components within normal limits  PROTIME-INR - Abnormal; Notable for the following:    Prothrombin Time 48.7 (*)    INR 5.35 (*)    All other components within normal limits  BRAIN NATRIURETIC PEPTIDE  TROPONIN I  TYPE  AND SCREEN   ____________________________________________  EKG  ED ECG REPORT I, Rockne MenghiniNorman, Anne-Caroline, the attending physician, personally viewed and interpreted this ECG.   Date: 07/11/2015  EKG Time: 1439   Rate: 70  Rhythm: normal sinus rhythm  Axis: leftward  Intervals:none  ST&T Change: no STEMI RBBB  ____________________________________________  RADIOLOGY  [place] Venous Img Lower Unilateral Left  07/11/2015  CLINICAL DATA:  Left leg swelling and pain for 3 weeks. EXAM: LEFT LOWER EXTREMITY VENOUS DOPPLER ULTRASOUND TECHNIQUE: Gray-scale sonography with graded compression, as well as color Doppler and duplex ultrasound were performed to evaluate the lower extremity deep venous systems from the level of the common femoral vein and including the common femoral, femoral, profunda femoral, popliteal and calf veins including the posterior tibial, peroneal and gastrocnemius veins when visible. The superficial great saphenous vein was also interrogated. Spectral Doppler was utilized to evaluate flow at rest and with distal augmentation maneuvers in the common femoral, femoral and popliteal veins. COMPARISON:  07/02/2015 FINDINGS: Contralateral Common Femoral Vein: Respiratory phasicity is normal and symmetric with the symptomatic side. No evidence of thrombus. Normal compressibility. Common Femoral Vein: No evidence of thrombus. Normal compressibility, respiratory phasicity and response to augmentation. Saphenofemoral Junction: No evidence of thrombus. Normal compressibility and flow on color Doppler imaging. Profunda Femoral Vein: No evidence of thrombus. Normal compressibility and flow on color Doppler imaging. Femoral Vein: No evidence of thrombus. Normal compressibility, respiratory phasicity and response to augmentation. Popliteal Vein: No evidence of thrombus. Normal compressibility, respiratory phasicity and response to augmentation. Calf Veins: Not well visualized due to diffuse soft tissue  edema. Superficial Great Saphenous Vein: No evidence of thrombus. Normal compressibility and flow on color Doppler imaging. Venous Reflux:  None. Other Findings:  Diffuse lower extremity soft tissue edema noted. IMPRESSION: Nonvisualization of calf veins due to soft tissue edema. No acute deep venous thrombosis identified. Electronically Signed   By: Myles RosenthalJohn  Stahl M.D.   On: 07/11/2015 14:17   Dg Chest Portable 1 View  07/11/2015  CLINICAL DATA:  Pt to ed with c/o coughing up blood today. Pt recently d/c from hospital, on blood thinners at home. Pt also c/o pain and swelling to left lower leg. Left leg red, with increased swelling and warm to touch. Pt also states swelling into groin area. EXAM: PORTABLE CHEST 1 VIEW COMPARISON:  05/27/2015 FINDINGS: Patient rotated  left. Cardiomegaly accentuated by AP portable technique. New or increased left pleural effusion. No pneumothorax. Interstitial prominence and indistinctness is moderate, progressive. Patchy left lower and right upper lobe airspace opacities. IMPRESSION: Moderate congestive heart failure. Left pleural effusion, new or enlarged. Adjacent airspace disease could represent atelectasis or infection. There is also right upper lobe airspace disease, favoring pulmonary edema. Electronically Signed   By: Jeronimo Greaves M.D.   On: 07/11/2015 12:43    ____________________________________________   PROCEDURES  Procedure(s) performed: None  Critical Care performed: No ____________________________________________   INITIAL IMPRESSION / ASSESSMENT AND PLAN / ED COURSE  Pertinent labs & imaging results that were available during my care of the patient were reviewed by me and considered in my medical decision making (see chart for details).  59 y.o. male on Coumadin for A. fib presenting with worsening left lower shotty cellulitis as well as one episode of hemoptysis. I will again ultrasound his left lower extremity to rule out DVT especially given the  new hemoptysis and his body habitus which may make his previous ultrasound less likely to give a full evaluation for clot. In the meantime, I will reinitiate IV antibiotics for his infection. We will also get a CT of the chest to rule out PE although the patient is not hypoxic, tachy. The patient will likely be admitted.   ----------------------------------------- 2:25 PM on 07/11/2015 -----------------------------------------  The patient states that his pain is completely resolved. He is feeling better. His labs do show chronic anemia that is stable, as well as hyponatremia and stable renal insufficiency. He is mildly hyperglycemic without being in DKA. His ultrasound does not show a DVT in his chest x-ray shows fluid that is consistent with pulmonary edema as well as a left pleural effusion. He does not have any signs or symptoms of pneumonia so will not treat him for that infection at this time.  At this time, he does have a negative ultrasound for DVT. He does not have hypoxia or tachycardia. He is Artie anticoagulated. The risks that this single minimal episode of hemoptysis is Route from a PE is low, and I'm concerned about the IV contrast load given his renal insufficiency. We will hold on CT chest for now and the hospitalist will reevaluate him for that.  ____________________________________________  FINAL CLINICAL IMPRESSION(S) / ED DIAGNOSES  Final diagnoses:  None      NEW MEDICATIONS STARTED DURING THIS VISIT:  New Prescriptions   No medications on file     Rockne Menghini, MD 07/11/15 1443

## 2015-07-11 NOTE — Progress Notes (Signed)
Notified Dr Elpidio AnisSudini that I received a call from lab reporting elevated troponin of 0.04; Dr acknowledged, no new orders. Also enquired about pt CBG monitoring and insulin regimen; Dr acknowledged, stated he would look at chart and decide on orders

## 2015-07-11 NOTE — Progress Notes (Signed)
ANTIBIOTIC CONSULT NOTE - INITIAL  Pharmacy Consult for Vancomycin and Zosyn Indication: Cellulitis   Allergies  Allergen Reactions  . Enalapril Cough  . Atorvastatin Palpitations  . Lovastatin Palpitations    Patient Measurements: Height: 6' (182.9 cm) Weight: (!) 370 lb (167.831 kg) IBW/kg (Calculated) : 77.6 Adjusted Body Weight: 113.7 kg  Vital Signs: Temp: 97.1 F (36.2 C) (10/20 1601) Temp Source: Oral (10/20 1601) BP: 84/61 mmHg (10/20 1601) Pulse Rate: 58 (10/20 1601) Intake/Output from previous day:   Intake/Output from this shift:    Labs:  Recent Labs  07/11/15 1257  WBC 10.5  HGB 9.8*  PLT 336  CREATININE 1.90*   Estimated Creatinine Clearance: 67.3 mL/min (by C-G formula based on Cr of 1.9). No results for input(s): VANCOTROUGH, VANCOPEAK, VANCORANDOM, GENTTROUGH, GENTPEAK, GENTRANDOM, TOBRATROUGH, TOBRAPEAK, TOBRARND, AMIKACINPEAK, AMIKACINTROU, AMIKACIN in the last 72 hours.   Microbiology: No results found for this or any previous visit (from the past 720 hour(s)).  Medical History: Past Medical History  Diagnosis Date  . Cataract   . CHF (congestive heart failure) (HCC)   . COPD (chronic obstructive pulmonary disease) (HCC)   . Diabetes mellitus without complication (HCC)   . Sleep apnea   . Oxygen deficiency   . Thyroid disease   . Gout   . Hypothyroidism   . CKD (chronic kidney disease) stage 3, GFR 30-59 ml/min   . Asthma   . Hypertension     Medications:  Scheduled:  . docusate sodium  100 mg Oral BID  . furosemide  40 mg Intravenous BID  . sodium chloride  3 mL Intravenous Q12H   Assessment: Pharmacy consulted to assist in the dosing of Vancomycin and Zosyn therapy in this 59 year old male being treated for cellulitis. Patient has past medical history significant for diabetes, htn, and CHF. Patient recently in the hospital from Oct 10-14 and was treated with Unasyn and discharged on Ceftin. Cellulitis has worsen despite still  taking Ceftin.   PK parameters:  VD: 79.6; t1/2: 12.8 hours; ke= 0.0603   Goal of Therapy:  Vancomycin trough level 10-15 mcg/ml  Plan:  Will give Vancomycin 1250 mg IV x 1 and will then start Vancomycin 1 g IV q12 hours starting @ 00:00 on 10/21. Trough level ordered to be drawn prior to the 12:00 dose on 10/22.   Will start Zosyn 4.5 g IV q8 hours since patient's body weight is >120 kg.   Pharmacy to follow and assist in the management of antimicrobial therapy in this patient.   Follow up culture results   Demetrius Charityeldrin D. Vonna Brabson, PharmD, BCPS  07/11/2015,4:32 PM

## 2015-07-11 NOTE — Patient Outreach (Signed)
This RN CM was close to pt's home for scheduled home visit,  viewed in Epic pt went to ED today (left leg cellulitis, hemoptysis), admitted.   Plan to f/u at discharge - phone calls, home visit.    HIPPA compliant voice message left on pt's phone (so spouse could call if needed).    Shayne Alkenose M.   Rozanne Heumann RN CCM [facility] LPHN Care Management  [phone]

## 2015-07-11 NOTE — H&P (Signed)
[facility] [place] at [facility]   PATIENT NAME: Tyler PittMichael [name]    MR#:  [id number]  DATE OF BIRTH:  July 25, 1956  DATE OF ADMISSION:  07/11/2015  PRIMARY CARE PHYSICIAN: Marguarite ArbourSPARKS,JEFFREY D, MD   REQUESTING/REFERRING PHYSICIAN: Dr. Sharma CovertNorman  CHIEF COMPLAINT:   Chief Complaint  Patient presents with  . Hemoptysis    HISTORY OF PRESENT ILLNESS:  Tyler PittMichael [name]  is a 59 y.o. male with a known history of hypertension, diabetes, CHF presents to the emergency room complaining of pain and worsening redness of left lower extremity. Patient was recently in the hospital from October 10 to October 14 was treated with IV Unasyn as inpatient and later changed to Ceftin at discharge. Patient is still taking the antibiotic but due to worsening cellulitis presented to the emergency room. Here he is afebrile but considering the extensive nature of the cellulitis along with failed outpatient therapy is being admitted as inpatient. Lower extremity Doppler repeated today showed no DVT. He has also noticed some streaks of blood with his chronic cough which is unchanged. Patient's INR is 5.5. He is taking Coumadin at home. Overall feels weaker than normal. Barely able to stand according to the wife. Unable to walk. Lays in the bed or sits in his chair all day.  PAST MEDICAL HISTORY:   Past Medical History  Diagnosis Date  . Cataract   . CHF (congestive heart failure) (HCC)   . COPD (chronic obstructive pulmonary disease) (HCC)   . Diabetes mellitus without complication (HCC)   . Sleep apnea   . Oxygen deficiency   . Thyroid disease   . Gout   . Hypothyroidism   . CKD (chronic kidney disease) stage 3, GFR 30-59 ml/min   . Asthma   . Hypertension     PAST SURGICAL HISTORY:   Past Surgical History  Procedure Laterality Date  . Cholecystectomy      2002  . Eye surgery      right cataract removed 3/21  . Vena cava filter placement      SOCIAL HISTORY:   Social  History  Substance Use Topics  . Smoking status: Current Some Day Smoker -- 0.00 packs/day for 30 years    Types: Cigars  . Smokeless tobacco: Never Used     Comment: cigar 3-4 yrs 1-3 per day/former Cigarettes  . Alcohol Use: No    FAMILY HISTORY:   Family History  Problem Relation Age of Onset  . Diabetes Sister   . Diabetes Brother   . Deep vein thrombosis Mother   . CVA Father     DRUG ALLERGIES:   Allergies  Allergen Reactions  . Enalapril Cough  . Atorvastatin Palpitations  . Lovastatin Palpitations    REVIEW OF SYSTEMS:   Review of Systems  Constitutional: Positive for chills and malaise/fatigue. Negative for fever and weight loss.  HENT: Negative for hearing loss and nosebleeds.   Eyes: Negative for blurred vision, double vision and pain.  Respiratory: Positive for cough, hemoptysis and shortness of breath. Negative for sputum production and wheezing.   Cardiovascular: Negative for chest pain, palpitations, orthopnea and leg swelling.  Gastrointestinal: Negative for nausea, vomiting, abdominal pain, diarrhea and constipation.  Genitourinary: Negative for dysuria and hematuria.  Musculoskeletal: Positive for back pain. Negative for myalgias and falls.  Skin: Positive for rash.  Neurological: Negative for dizziness, tremors, sensory change, speech change, focal weakness, seizures and headaches.  Endo/Heme/Allergies: Does not bruise/bleed easily.  Psychiatric/Behavioral: Negative for depression and  memory loss. The patient has insomnia. The patient is not nervous/anxious.     MEDICATIONS AT HOME:   Prior to Admission medications   Medication Sig Start Date End Date Taking? Authorizing Provider  acetaminophen (TYLENOL) 500 MG tablet Take 1,000 mg by mouth every 6 (six) hours as needed for mild pain or headache.   Yes Historical Provider, MD  alum & mag hydroxide-simeth (MAALOX/MYLANTA) 200-200-20 MG/5ML suspension Take 30 mLs by mouth every 4 (four) hours as  needed for indigestion or heartburn. 05/30/15  Yes Marguarite Arbour, MD  ammonium lactate (LAC-HYDRIN) 12 % lotion Apply topically as needed for dry skin. 07/05/15  Yes Marguarite Arbour, MD  cefUROXime (CEFTIN) 500 MG tablet Take 1 tablet (500 mg total) by mouth 2 (two) times daily with a meal. 07/05/15  Yes Marguarite Arbour, MD  colchicine 0.6 MG tablet Take 0.6 mg by mouth as needed (for gout flares).    Yes Historical Provider, MD  cyanocobalamin (,VITAMIN B-12,) 1000 MCG/ML injection Inject 1,000 mcg into the muscle every 30 (thirty) days.   Yes Historical Provider, MD  cyanocobalamin 2000 MCG tablet Take 2,000 mcg by mouth daily.   Yes Historical Provider, MD  fluticasone (FLONASE) 50 MCG/ACT nasal spray Place 2 sprays into both nostrils daily as needed for rhinitis.   Yes Historical Provider, MD  gabapentin (NEURONTIN) 100 MG capsule Take 100 mg by mouth 2 (two) times daily.   Yes Historical Provider, MD  hydrOXYzine (ATARAX/VISTARIL) 25 MG tablet Take 25-50 mg by mouth 3 (three) times daily as needed for itching.   Yes Historical Provider, MD  insulin aspart (NOVOLOG) 100 UNIT/ML injection Inject 0-9 Units into the skin 3 (three) times daily with meals. 05/30/15  Yes Marguarite Arbour, MD  insulin NPH Human (HUMULIN N,NOVOLIN N) 100 UNIT/ML injection Inject into the skin as needed (for high blood sugar). Pt states that he uses per sliding scale.   Yes Historical Provider, MD  insulin regular (NOVOLIN R,HUMULIN R) 100 units/mL injection Inject into the skin as needed for high blood sugar. Pt states that he uses per sliding scale.   Yes Historical Provider, MD  ipratropium-albuterol (DUONEB) 0.5-2.5 (3) MG/3ML SOLN Take 3 mLs by nebulization every 4 (four) hours as needed. Patient taking differently: Take 3 mLs by nebulization every 4 (four) hours as needed (for shortness of breath).  04/19/15  Yes Marguarite Arbour, MD  lactulose (CHRONULAC) 10 GM/15ML solution Take 20 g by mouth 2 (two) times daily as  needed for mild constipation.    Yes Historical Provider, MD  levothyroxine (SYNTHROID, LEVOTHROID) 100 MCG tablet Take 100 mcg by mouth daily.   Yes Historical Provider, MD  ondansetron (ZOFRAN) 4 MG tablet Take 1 tablet (4 mg total) by mouth every 6 (six) hours as needed for nausea. 04/19/15  Yes Marguarite Arbour, MD  oxyCODONE-acetaminophen (PERCOCET/ROXICET) 5-325 MG tablet Take 2 tablets by mouth every 6 (six) hours as needed for moderate pain. 07/05/15  Yes Marguarite Arbour, MD  PARoxetine (PAXIL) 20 MG tablet Take 20 mg by mouth daily.   Yes Historical Provider, MD  polyethylene glycol (MIRALAX / GLYCOLAX) packet Take 17 g by mouth daily as needed for mild constipation.    Yes Historical Provider, MD  pravastatin (PRAVACHOL) 40 MG tablet Take 40 mg by mouth at bedtime.   Yes Historical Provider, MD  salmeterol (SEREVENT) 50 MCG/DOSE diskus inhaler Inhale 1 puff into the lungs 2 (two) times daily as needed (for shortness of  breath).   Yes Historical Provider, MD  spironolactone (ALDACTONE) 25 MG tablet Take 25 mg by mouth 2 (two) times daily.   Yes Historical Provider, MD  tamsulosin (FLOMAX) 0.4 MG CAPS capsule Take 1 capsule (0.4 mg total) by mouth daily. 04/19/15  Yes Marguarite Arbour, MD  torsemide (DEMADEX) 100 MG tablet Take 50 mg by mouth daily.   Yes Historical Provider, MD  warfarin (COUMADIN) 10 MG tablet Take 5-10 mg by mouth every evening. Pt takes 5mg  on Tuesday, Wednesday, Thursday, Saturday, and Sunday.   Pt takes 10mg  on Monday and Friday.   Yes Historical Provider, MD      VITAL SIGNS:  Blood pressure 92/65, pulse 81, height 6' (1.829 m), weight 167.831 kg (370 lb), SpO2 87 %.  PHYSICAL EXAMINATION:  Physical Exam  GENERAL:  59 y.o.-year-old patient lying in the bed with no acute distress. Morbidly obese EYES: Pupils equal, round, reactive to light and accommodation. No scleral icterus. Extraocular muscles intact.  HEENT: Head atraumatic, normocephalic. Oropharynx and  nasopharynx clear. No oropharyngeal erythema, moist oral mucosa  NECK:  Supple, no jugular venous distention. No thyroid enlargement, no tenderness.  LUNGS:  no wheezing, , rhonchi. No use of accessory muscles of respiration. Bilateral basal crackles CARDIOVASCULAR: S1, S2 normal. No murmurs, rubs, or gallops.  ABDOMEN: Soft, nontender, nondistended. Bowel sounds present. No organomegaly or mass.  EXTREMITIES: No  cyanosis, or clubbing. + 2 pedal & radial pulses b/l. Bilateral lower extremity edema NEUROLOGIC: Cranial nerves II through XII are intact. No focal Motor or sensory deficits appreciated b/l PSYCHIATRIC: The patient is alert and oriented x 3. Good affect.  SKIN: Erythema left lower extremity extending up to the groin.. Tender and warm. Right heel blister.  LABORATORY PANEL:   CBC  Recent Labs Lab 07/11/15 1257  WBC 10.5  HGB 9.8*  HCT 31.0*  PLT 336   ------------------------------------------------------------------------------------------------------------------  Chemistries   Recent Labs Lab 07/11/15 1257  NA 128*  K 4.1  CL 94*  CO2 22  GLUCOSE 204*  BUN 59*  CREATININE 1.90*  CALCIUM 8.2*   ------------------------------------------------------------------------------------------------------------------  Cardiac Enzymes No results for input(s): TROPONINI in the last 168 hours. ------------------------------------------------------------------------------------------------------------------  RADIOLOGY:  US Venous Img Lower Unilateral Left  07/11/2015  CLINICAL DATA:  Left leg swelling and pain for 3 weeks. EXAM: LEFT LOWER EXTREMITY VENOUS DOPPLER ULTRASOUND TECHNIQUE: Gray-scale sonography with graded compression, as well as color Doppler and duplex ultrasound were performed to evaluate the lower extremity deep venous systems from the level of the common femoral vein and including the common femoral, femoral, profunda femoral, popliteal and calf veins  including the posterior tibial, peroneal and gastrocnemius veins when visible. The superficial great saphenous vein was also interrogated. Spectral Doppler was utilized to evaluate flow at rest and with distal augmentation maneuvers in the common femoral, femoral and popliteal veins. COMPARISON:  07/02/2015 FINDINGS: Contralateral Common Femoral Vein: Respiratory phasicity is normal and symmetric with the symptomatic side. No evidence of thrombus. Normal compressibility. Common Femoral Vein: No evidence of thrombus. Normal compressibility, respiratory phasicity and response to augmentation. Saphenofemoral Junction: No evidence of thrombus. Normal compressibility and flow on color Doppler imaging. Profunda Femoral Vein: No evidence of thrombus. Normal compressibility and flow on color Doppler imaging. Femoral Vein: No evidence of thrombus. Normal compressibility, respiratory phasicity and response to augmentation. Popliteal Vein: No evidence of thrombus. Normal compressibility, respiratory phasicity and response to augmentation. Calf Veins: Not well visualized due to diffuse soft tissue edema. Superficial Great Saphenous  Vein: No evidence of thrombus. Normal compressibility and flow on color Doppler imaging. Venous Reflux:  None. Other Findings:  Diffuse lower extremity soft tissue edema noted. IMPRESSION: Nonvisualization of calf veins due to soft tissue edema. No acute deep venous thrombosis identified. Electronically Signed   By: Myles Rosenthal M.D.   On: 07/11/2015 14:17   Dg Chest Portable 1 View  07/11/2015  CLINICAL DATA:  Pt to ed with c/o coughing up blood today. Pt recently d/c from hospital, on blood thinners at home. Pt also c/o pain and swelling to left lower leg. Left leg red, with increased swelling and warm to touch. Pt also states swelling into groin area. EXAM: PORTABLE CHEST 1 VIEW COMPARISON:  05/27/2015 FINDINGS: Patient rotated left. Cardiomegaly accentuated by AP portable technique. New or  increased left pleural effusion. No pneumothorax. Interstitial prominence and indistinctness is moderate, progressive. Patchy left lower and right upper lobe airspace opacities. IMPRESSION: Moderate congestive heart failure. Left pleural effusion, new or enlarged. Adjacent airspace disease could represent atelectasis or infection. There is also right upper lobe airspace disease, favoring pulmonary edema. Electronically Signed   By: Jeronimo Greaves M.D.   On: 07/11/2015 12:43     IMPRESSION AND PLAN:   * Left lower extremity cellulitis - failed outpatient therapy Start patient on IV vancomycin and Zosyn. Extensive area of involvement of left lower extremity. Patient was treated with IV antibiotics as inpatient from 1010 to 07/05/2015. Start home on Ceftin which he is still taking. Other management as per response to treatment. Pain control.  * Acute on Chronic diastolic congestive heart failure Chest x-ray shows pulmonary edema along with pleural effusion. Start IV Lasix with close monitoring of kidney function. The weight and input and output.  * Hemoptysis This was very minimal with streaks of blood with his chronic cough and sputum production. Likely from supratherapeutic INR. Would monitor. No intervention needed at this time.  * Supratherapeutic INR Patient's INR was at 4 recently. He has continued to take Coumadin. Hold Coumadin and repeat PT/INR in the morning.  * CKD stage III Stable Repeat labs as patient will be on IV Lasix  * Diabetes mellitus Sliding scale insulin. Diabetic diet.  * Hypertension Borderline Low blood pressure. Hold medications  * Hypovolemic hyponatremia Should improve with diuresis.  * COPD No wheezing.  * DVT prophylaxis Patient is on Coumadin with INR at 5   All the records are reviewed and case discussed with ED provider. Management plans discussed with the patient, family and they are in agreement.  CODE STATUS: FULL  TOTAL TIME TAKING CARE  OF THIS PATIENT: 40 minutes.    Milagros Loll R M.D on 07/11/2015 at 3:30 PM  Between 7am to 6pm - Pager - [phone]  After 6pm go to [email or web address] - password [email or web address] [facility]  [facility]  Office  [phone]  CC: Primary care physician; Marguarite Arbour, MD     Note: This dictation was prepared with Dragon dictation along with smaller phrase technology. Any transcriptional errors that result from this process are unintentional.

## 2015-07-11 NOTE — ED Notes (Signed)
Pt to ed with c/o coughing up blood today.  Pt recently d/c from hospital, on blood thinners at home.  Pt also c/o pain and swelling to left lower leg. Left leg red, with increased swelling and warm to touch.  Pt also states swelling into groin area. On home o2 at 2.5 lpm

## 2015-07-11 NOTE — Progress Notes (Signed)
   07/11/15 1800  Clinical Encounter Type  Visited With Patient and family together  Visit Type Initial  Referral From Patient  Consult/Referral To Chaplain  Spiritual Encounters  Spiritual Needs Emotional;Grief support  Stress Factors  Patient Stress Factors Major life changes;Health changes;Family relationships   Chaplain visited with patient for an initial request to complete an Advanced Directive. Patient stated he did not want to complete that and wife stated that patient would benefit from talking to a spiritual person. Patient opened up about spiritual and personal issues along with fears. Chaplain will revisit with patient in the morning to continue with spiritual care.   Chaplain Jashanti Clinkscale [phone]

## 2015-07-11 NOTE — ED Notes (Signed)
Pt was refusing to get out of wheelchair into bed, so a hospital bed obtained. Pt now refusing to put hospital gown on for ct r/o pe. Attempted to explain why (shirt has buttons and will interfere with picture). Pt still yelling that he wants to go home, so left to allow family to talk to him.

## 2015-07-12 LAB — GLUCOSE, CAPILLARY
GLUCOSE-CAPILLARY: 186 mg/dL — AB (ref 65–99)
GLUCOSE-CAPILLARY: 208 mg/dL — AB (ref 65–99)
Glucose-Capillary: 199 mg/dL — ABNORMAL HIGH (ref 65–99)
Glucose-Capillary: 222 mg/dL — ABNORMAL HIGH (ref 65–99)

## 2015-07-12 LAB — BASIC METABOLIC PANEL
ANION GAP: 7 (ref 5–15)
BUN: 60 mg/dL — ABNORMAL HIGH (ref 6–20)
CHLORIDE: 99 mmol/L — AB (ref 101–111)
CO2: 27 mmol/L (ref 22–32)
Calcium: 8 mg/dL — ABNORMAL LOW (ref 8.9–10.3)
Creatinine, Ser: 1.83 mg/dL — ABNORMAL HIGH (ref 0.61–1.24)
GFR, EST AFRICAN AMERICAN: 45 mL/min — AB (ref >60)
GFR, EST NON AFRICAN AMERICAN: 39 mL/min — AB (ref >60)
Glucose, Bld: 184 mg/dL — ABNORMAL HIGH (ref 65–99)
POTASSIUM: 4.1 mmol/L (ref 3.5–5.1)
SODIUM: 133 mmol/L — AB (ref 135–145)

## 2015-07-12 LAB — CBC
HCT: 28.9 % — ABNORMAL LOW (ref 40.0–52.0)
Hemoglobin: 9.3 g/dL — ABNORMAL LOW (ref 13.0–18.0)
MCH: 25.5 pg — AB (ref 26.0–34.0)
MCHC: 32.1 g/dL (ref 32.0–36.0)
MCV: 79.5 fL — AB (ref 80.0–100.0)
PLATELETS: 291 10*3/uL (ref 150–440)
RBC: 3.63 MIL/uL — AB (ref 4.40–5.90)
RDW: 18.5 % — ABNORMAL HIGH (ref 11.5–14.5)
WBC: 6.8 10*3/uL (ref 3.8–10.6)

## 2015-07-12 LAB — PROTIME-INR
INR: 6.15
PROTHROMBIN TIME: 54.2 s — AB (ref 11.4–15.0)

## 2015-07-12 MED ORDER — CEFAZOLIN SODIUM 1-5 GM-% IV SOLN
1.0000 g | Freq: Three times a day (TID) | INTRAVENOUS | Status: DC
  Administered 2015-07-12 – 2015-07-17 (×12): 1 g via INTRAVENOUS
  Filled 2015-07-12 (×20): qty 50

## 2015-07-12 NOTE — Progress Notes (Signed)
   07/12/15 1000  Clinical Encounter Type  Visited With Patient  Visit Type Follow-up  Consult/Referral To Chaplain  Spiritual Encounters  Spiritual Needs Emotional;Grief support  Stress Factors  Patient Stress Factors Major life changes;Loss of control;Health changes  Chaplain visited with patient and provided pastoral care. Patient is concerned about home health and doesn't want to go to a nursing home. He states that if he could just a couple of hours of help from a nurse aid it would help lift some burdens. I spoke with nursing clerk and she informed me that he has a consult put in to speak with a Child psychotherapistsocial worker.   Chaplain Alexsandra Shontz [phone]

## 2015-07-12 NOTE — Care Management (Signed)
Contacted Tamala BariMary Manley at [facility] [phone] to review case. Patient meets criteria for tele health. Insurance will cover. Discharge plane will be for home with home health. Patient refuses to consider SNF/STR at this time.

## 2015-07-12 NOTE — Progress Notes (Signed)
Recieved request from Wilford Gristara Mitchell, LCSW to assist with obtaining copies of past 2 years of hospital bills for patient to start Medicaid application process. Per Raynelle FanningJulie in Patient Accounting [phone]), would need to have patient complete a medical release form and fax it to them with specific instructions on what information was needed to [phone].  Obtained a medical release form, which patient signed.  Requesting copies of bills inpatient hospital stays and outpatient procedures between 07/11/13 - 07/12/15.  Called Jasmine DecemberSharon, patient's wife, at work [phone]) to update her on what had been done so far to obtain copies of hospital bills.  Cover sheet and completed medical release form faxed to [phone].  Indicated on fax cover sheet for Patient Accounting to contact me with any questions and requested they send information to my attention at the CM/CSW fax: [phone].

## 2015-07-12 NOTE — Progress Notes (Signed)
Initial Nutrition Assessment     INTERVENTION:  Meals and snacks: Cater to pt preferences within diet restriction   NUTRITION DIAGNOSIS:    (None at this time) related to   as evidenced by  .    GOAL:   Patient will meet greater than or equal to 90% of their needs    MONITOR:    (Energy intake, Anthropometrics)  REASON FOR ASSESSMENT:   Other (Comment) (pressure ulcer stage II)    ASSESSMENT:      Pt admitted with left leg cellulitis  Past Medical History  Diagnosis Date  . Cataract   . CHF (congestive heart failure) (HCC)   . COPD (chronic obstructive pulmonary disease) (HCC)   . Diabetes mellitus without complication (HCC)   . Sleep apnea   . Oxygen deficiency   . Thyroid disease   . Gout   . Hypothyroidism   . CKD (chronic kidney disease) stage 3, GFR 30-59 ml/min   . Asthma   . Hypertension     Current Nutrition: ate 50% of breakfast this am, tolerating well  Food/Nutrition-Related History:  Pt reports has been reducing intake prior to admission to try and loose weight   Scheduled Medications:  . antiseptic oral rinse  7 mL Mouth Rinse BID  . docusate sodium  100 mg Oral BID  . furosemide  40 mg Intravenous BID  . gabapentin  100 mg Oral BID  . insulin aspart  0-5 Units Subcutaneous QHS  . insulin aspart  0-9 Units Subcutaneous TID WC  . levothyroxine  100 mcg Oral QAC breakfast  . PARoxetine  20 mg Oral Daily  . piperacillin-tazobactam (ZOSYN)  IV  4.5 g Intravenous 3 times per day  . pravastatin  40 mg Oral QHS  . sodium chloride  3 mL Intravenous Q12H  . spironolactone  25 mg Oral BID  . tamsulosin  0.4 mg Oral Daily  . torsemide  50 mg Oral Daily  . vancomycin  1,000 mg Intravenous Q12H       Electrolyte/Renal Profile and Glucose Profile:   Recent Labs Lab 07/11/15 1257 07/12/15 0448  NA 128* 133*  K 4.1 4.1  CL 94* 99*  CO2 22 27  BUN 59* 60*  CREATININE 1.90* 1.83*  CALCIUM 8.2* 8.0*  GLUCOSE 204* 184*     Gastrointestinal Profile: Last BM:10/21      Weight Change: reviewed wt encounters    Diet Order:  Diet heart healthy/carb modified Room service appropriate?: Yes; Fluid consistency:: Thin  Skin:   noted pressure ulcer stage II   Height:   Ht Readings from Last 1 Encounters:  07/11/15 6' (1.829 m)    Weight:   Wt Readings from Last 1 Encounters:  07/12/15 367 lb 3.2 oz (166.561 kg)     BMI:  Body mass index is 49.79 kg/(m^2).   EDUCATION NEEDS:   No education needs identified at this time  LOW Care Level  Allyce Bochicchio B. Freida BusmanAllen, RD, LDN [phone] (pager)

## 2015-07-12 NOTE — Care Management (Addendum)
Spoke with patient for discharge planning. Patient is from home with spouse and adult children and grandchildren. Patient is sitting in chair alert and oriented and pleasant. Has Walker and wheelchair at home. Discussed plan for discharge including SNF and home health. [facility] Discussed telehealth and home health PT. Patient is agreeable to this plan if insurance will pay. Patient has applied for medicaid as well and this will allow home health aid in future once approved. Discussed case with Wilford Gristara Mitchell CSW who is familiar with patient and agrees with current plan. Patient has O2 provided in the home. Unsure of agency. [facility] managerRose M. Pierzchala RN CCM [facility] [phone]   Continue to follow.

## 2015-07-12 NOTE — Consult Note (Signed)
[facility] Infectious Disease     Reason for Consult:Cellulitis    Referring Physician: Georgie Chard Date of Admission:  07/11/2015   Active Problems:   Left leg cellulitis   HPI: Tyler [name] is a 59 y.o. male known history of hypertension, diabetes, CHF presents to the emergency room complaining of pain and worsening redness of left lower extremity. Patient was recently in the hospital from October 10 to October 14 was treated with IV Unasyn as inpatient and later changed to Ceftin at discharge. Patient is still taking the antibiotic but due to worsening cellulitis presented to the emergency room. Here he is afebrile but considering the extensive nature of the cellulitis along with failed outpatient therapy is being admitted as inpatient. Lower extremity Doppler repeated today showed no DVT.  Past Medical History  Diagnosis Date  . Cataract   . CHF (congestive heart failure) ([place])   . COPD (chronic obstructive pulmonary disease) ([place])   . Diabetes mellitus without complication ([place])   . Sleep apnea   . Oxygen deficiency   . Thyroid disease   . Gout   . Hypothyroidism   . CKD (chronic kidney disease) stage 3, GFR 30-59 ml/min   . Asthma   . Hypertension    Past Surgical History  Procedure Laterality Date  . Cholecystectomy      2002  . Eye surgery      right cataract removed 3/21  . Vena cava filter placement     Social History  Substance Use Topics  . Smoking status: Current Some Day Smoker -- 0.00 packs/day for 30 years    Types: Cigars  . Smokeless tobacco: Never Used     Comment: cigar 3-4 yrs 1-3 per day/former Cigarettes  . Alcohol Use: No   Family History  Problem Relation Age of Onset  . Diabetes Sister   . Diabetes Brother   . Deep vein thrombosis Mother   . CVA Father     Allergies:  Allergies  Allergen Reactions  . Enalapril Cough  . Atorvastatin Palpitations  . Lovastatin Palpitations    Current antibiotics: Antibiotics Given (last  72 hours)    Date/Time Action Medication Dose Rate   07/11/15 1812 Given   vancomycin (VANCOCIN) 1,250 mg in sodium chloride 0.9 % 250 mL IVPB 1,250 mg 166.7 mL/hr   07/11/15 1812 Given   piperacillin-tazobactam (ZOSYN) IVPB 4.5 g 4.5 g 25 mL/hr   07/11/15 2349 Given   vancomycin (VANCOCIN) IVPB 1000 mg/200 mL premix 1,000 mg 200 mL/hr   07/12/15 0526 Given   piperacillin-tazobactam (ZOSYN) IVPB 4.5 g 4.5 g 25 mL/hr   07/12/15 1231 Given   vancomycin (VANCOCIN) IVPB 1000 mg/200 mL premix 1,000 mg 200 mL/hr   07/12/15 1508 Given   piperacillin-tazobactam (ZOSYN) IVPB 4.5 g 4.5 g 25 mL/hr      MEDICATIONS: . antiseptic oral rinse  7 mL Mouth Rinse BID  . docusate sodium  100 mg Oral BID  . furosemide  40 mg Intravenous BID  . gabapentin  100 mg Oral BID  . insulin aspart  0-5 Units Subcutaneous QHS  . insulin aspart  0-9 Units Subcutaneous TID WC  . levothyroxine  100 mcg Oral QAC breakfast  . PARoxetine  20 mg Oral Daily  . piperacillin-tazobactam (ZOSYN)  IV  4.5 g Intravenous 3 times per day  . pravastatin  40 mg Oral QHS  . sodium chloride  3 mL Intravenous Q12H  . spironolactone  25 mg Oral BID  .  tamsulosin  0.4 mg Oral Daily  . torsemide  50 mg Oral Daily  . vancomycin  1,000 mg Intravenous Q12H    Review of Systems - 11 systems reviewed and negative per HPI   OBJECTIVE: Temp:  [97.1 F (36.2 C)-98.4 F (36.9 C)] 97.5 F (36.4 C) (10/21 1445) Pulse Rate:  [58-73] 70 (10/21 1445) Resp:  [14-18] 18 (10/21 1445) BP: (83-116)/(47-76) 116/76 mmHg (10/21 1445) SpO2:  [93 %-100 %] 100 % (10/21 1445) Weight:  [166.561 kg (367 lb 3.2 oz)] 166.561 kg (367 lb 3.2 oz) (10/21 0500) Physical Exam  Constitutional: He is oriented to person, place, and time. morbnidly obese, ill apperaing, on O2 HENT: Mouth/Throat: Oropharynx is clear and moist. No oropharyngeal exudate.  Cardiovascular: Normal rate, 3/6 sm Pulmonary/Chest: poor air movment.  Abdominal: Soft. Bowel sounds are  normal. obese Lymphadenopathy:  He has no cervical adenopathy.  Neurological: He is alert and oriented to person, place, and time.  Skin: LLE with dry skin, extensive pea du orange effect and cellulits EXT - 3+ edema LLE to above knee, 2+ RLE Psychiatric: He has a normal mood and affect. His behavior is normal.   LABS: Results for orders placed or performed during the hospital encounter of 07/11/15 (from the past 48 hour(s))  CBC     Status: Abnormal   Collection Time: 07/11/15 12:57 PM  Result Value Ref Range   WBC 10.5 3.8 - 10.6 K/uL   RBC 3.91 (L) 4.40 - 5.90 MIL/uL   Hemoglobin 9.8 (L) 13.0 - 18.0 g/dL   HCT 31.0 (L) 40.0 - 52.0 %   MCV 79.2 (L) 80.0 - 100.0 fL   MCH 25.1 (L) 26.0 - 34.0 pg   MCHC 31.7 (L) 32.0 - 36.0 g/dL   RDW 18.5 (H) 11.5 - 14.5 %   Platelets 336 150 - 440 K/uL  Basic metabolic panel     Status: Abnormal   Collection Time: 07/11/15 12:57 PM  Result Value Ref Range   Sodium 128 (L) 135 - 145 mmol/L   Potassium 4.1 3.5 - 5.1 mmol/L   Chloride 94 (L) 101 - 111 mmol/L   CO2 22 22 - 32 mmol/L   Glucose, Bld 204 (H) 65 - 99 mg/dL   BUN 59 (H) 6 - 20 mg/dL   Creatinine, Ser 1.90 (H) 0.61 - 1.24 mg/dL   Calcium 8.2 (L) 8.9 - 10.3 mg/dL   GFR calc non Af Amer 37 (L) >60 mL/min   GFR calc Af Amer 43 (L) >60 mL/min    Comment: (NOTE) The eGFR has been calculated using the CKD EPI equation. This calculation has not been validated in all clinical situations. eGFR's persistently <60 mL/min signify possible Chronic Kidney Disease.    Anion gap 12 5 - 15  APTT     Status: Abnormal   Collection Time: 07/11/15 12:57 PM  Result Value Ref Range   aPTT 54 (H) 24 - 36 seconds  Protime-INR     Status: Abnormal   Collection Time: 07/11/15 12:57 PM  Result Value Ref Range   Prothrombin Time 48.7 (H) 11.4 - 15.0 seconds   INR 5.35 (HH)     Comment: CRITICAL RESULT CALLED TO, READ BACK BY AND VERIFIED WITH: GREG MOYER 07/11/15 AT 1430 VKB   Brain natriuretic  peptide     Status: Abnormal   Collection Time: 07/11/15 12:57 PM  Result Value Ref Range   B Natriuretic Peptide 1335.0 (H) 0.0 - 100.0 pg/mL  Troponin I  Status: Abnormal   Collection Time: 07/11/15 12:57 PM  Result Value Ref Range   Troponin I 0.04 (H) <0.031 ng/mL    Comment: READ BACK AND VERIFIED WITH  Neko JACOBS AT 1157 07/11/15 SDR        PERSISTENTLY INCREASED TROPONIN VALUES IN THE RANGE OF 0.04-0.49 ng/mL CAN BE SEEN IN:       -UNSTABLE ANGINA       -CONGESTIVE HEART FAILURE       -MYOCARDITIS       -CHEST TRAUMA       -ARRYHTHMIAS       -LATE PRESENTING MYOCARDIAL INFARCTION       -COPD   CLINICAL FOLLOW-UP RECOMMENDED.   ABO/Rh     Status: None   Collection Time: 07/11/15 12:58 PM  Result Value Ref Range   ABO/RH(D) A POS   Type and screen [place]     Status: None   Collection Time: 07/11/15  2:28 PM  Result Value Ref Range   ABO/RH(D) A POS    Antibody Screen NEG    Sample Expiration 07/14/2015   Glucose, capillary     Status: Abnormal   Collection Time: 07/11/15  9:15 PM  Result Value Ref Range   Glucose-Capillary 199 (H) 65 - 99 mg/dL  Basic metabolic panel     Status: Abnormal   Collection Time: 07/12/15  4:48 AM  Result Value Ref Range   Sodium 133 (L) 135 - 145 mmol/L   Potassium 4.1 3.5 - 5.1 mmol/L   Chloride 99 (L) 101 - 111 mmol/L   CO2 27 22 - 32 mmol/L   Glucose, Bld 184 (H) 65 - 99 mg/dL   BUN 60 (H) 6 - 20 mg/dL   Creatinine, Ser 1.83 (H) 0.61 - 1.24 mg/dL   Calcium 8.0 (L) 8.9 - 10.3 mg/dL   GFR calc non Af Amer 39 (L) >60 mL/min   GFR calc Af Amer 45 (L) >60 mL/min    Comment: (NOTE) The eGFR has been calculated using the CKD EPI equation. This calculation has not been validated in all clinical situations. eGFR's persistently <60 mL/min signify possible Chronic Kidney Disease.    Anion gap 7 5 - 15  Protime-INR     Status: Abnormal   Collection Time: 07/12/15  4:48 AM  Result Value Ref Range    Prothrombin Time 54.2 (H) 11.4 - 15.0 seconds   INR 6.15 (HH)     Comment: CRITICAL RESULT CALLED TO, READ BACK BY AND VERIFIED WITH: WHITNEY BIGELOW ON 07/12/15 AT 0633 BY QSD   CBC     Status: Abnormal   Collection Time: 07/12/15  4:48 AM  Result Value Ref Range   WBC 6.8 3.8 - 10.6 K/uL   RBC 3.63 (L) 4.40 - 5.90 MIL/uL   Hemoglobin 9.3 (L) 13.0 - 18.0 g/dL   HCT 28.9 (L) 40.0 - 52.0 %   MCV 79.5 (L) 80.0 - 100.0 fL   MCH 25.5 (L) 26.0 - 34.0 pg   MCHC 32.1 32.0 - 36.0 g/dL   RDW 18.5 (H) 11.5 - 14.5 %   Platelets 291 150 - 440 K/uL  Glucose, capillary     Status: Abnormal   Collection Time: 07/12/15  7:33 AM  Result Value Ref Range   Glucose-Capillary 186 (H) 65 - 99 mg/dL   Comment 1 Notify RN   Glucose, capillary     Status: Abnormal   Collection Time: 07/12/15 11:31 AM  Result Value Ref  Range   Glucose-Capillary 199 (H) 65 - 99 mg/dL   Comment 1 Notify RN    No components found for: ESR, C REACTIVE PROTEIN MICRO: No results found for this or any previous visit (from the past 720 hour(s)).  IMAGING: US Renal  07/02/2015  CLINICAL DATA:  Acute renal failure EXAM: RENAL / URINARY TRACT ULTRASOUND COMPLETE COMPARISON:  None. FINDINGS: Right Kidney: Length: 15.4 cm.  No mass or hydronephrosis. Left Kidney: Length: 13.7 cm.  No mass or hydronephrosis. Bladder: Underdistended/not visualized. Additional comments: Abdominal ascites. IMPRESSION: No hydronephrosis. Bladder is not discretely visualized/underdistended. Abdominal ascites. Electronically Signed   By: Julian Hy M.D.   On: 07/02/2015 18:13   US Venous Img Lower Unilateral Left  07/11/2015  CLINICAL DATA:  Left leg swelling and pain for 3 weeks. EXAM: LEFT LOWER EXTREMITY VENOUS DOPPLER ULTRASOUND TECHNIQUE: Gray-scale sonography with graded compression, as well as color Doppler and duplex ultrasound were performed to evaluate the lower extremity deep venous systems from the level of the common femoral vein and  including the common femoral, femoral, profunda femoral, popliteal and calf veins including the posterior tibial, peroneal and gastrocnemius veins when visible. The superficial great saphenous vein was also interrogated. Spectral Doppler was utilized to evaluate flow at rest and with distal augmentation maneuvers in the common femoral, femoral and popliteal veins. COMPARISON:  07/02/2015 FINDINGS: Contralateral Common Femoral Vein: Respiratory phasicity is normal and symmetric with the symptomatic side. No evidence of thrombus. Normal compressibility. Common Femoral Vein: No evidence of thrombus. Normal compressibility, respiratory phasicity and response to augmentation. Saphenofemoral Junction: No evidence of thrombus. Normal compressibility and flow on color Doppler imaging. Profunda Femoral Vein: No evidence of thrombus. Normal compressibility and flow on color Doppler imaging. Femoral Vein: No evidence of thrombus. Normal compressibility, respiratory phasicity and response to augmentation. Popliteal Vein: No evidence of thrombus. Normal compressibility, respiratory phasicity and response to augmentation. Calf Veins: Not well visualized due to diffuse soft tissue edema. Superficial Great Saphenous Vein: No evidence of thrombus. Normal compressibility and flow on color Doppler imaging. Venous Reflux:  None. Other Findings:  Diffuse lower extremity soft tissue edema noted. IMPRESSION: Nonvisualization of calf veins due to soft tissue edema. No acute deep venous thrombosis identified. Electronically Signed   By: Earle Gell M.D.   On: 07/11/2015 14:17   US Venous Img Lower Unilateral Left  07/02/2015  CLINICAL DATA:  Left lower extremity pain and swelling for the past 4-5 days. History of smoking. History of prior DVT and IVC filter placement. Evaluate for acute or chronic DVT. EXAM: LEFT LOWER EXTREMITY VENOUS DOPPLER ULTRASOUND TECHNIQUE: Gray-scale sonography with graded compression, as well as color Doppler  and duplex ultrasound were performed to evaluate the lower extremity deep venous systems from the level of the common femoral vein and including the common femoral, femoral, profunda femoral, popliteal and calf veins including the posterior tibial, peroneal and gastrocnemius veins when visible. The superficial great saphenous vein was also interrogated. Spectral Doppler was utilized to evaluate flow at rest and with distal augmentation maneuvers in the common femoral, femoral and popliteal veins. COMPARISON:  Bilateral lower extremity venous Doppler ultrasound - 10/30/2013 FINDINGS: Contralateral Common Femoral Vein: Respiratory phasicity is normal and symmetric with the symptomatic side. No evidence of thrombus. Normal compressibility. Common Femoral Vein: No evidence of thrombus. Normal compressibility, respiratory phasicity and response to augmentation. Saphenofemoral Junction: No evidence of thrombus. Normal compressibility and flow on color Doppler imaging. Profunda Femoral Vein: No evidence of thrombus. Normal  compressibility and flow on color Doppler imaging. Femoral Vein: No evidence of thrombus. Normal compressibility, respiratory phasicity and response to augmentation. Popliteal Vein: No evidence of thrombus. Normal compressibility, respiratory phasicity and response to augmentation. Calf Veins: No evidence of thrombus. Normal compressibility and flow on color Doppler imaging. Superficial Great Saphenous Vein: No evidence of thrombus. Normal compressibility and flow on color Doppler imaging. Venous Reflux:  None. Other Findings: Note is made of a benign appearing right inguinal lymph node which measures approximately 1 cm in greatest short axis diameter and maintains a benign fatty hila. Nonspecific prominent vascularity is noted about the hila of this lymph node. IMPRESSION: No evidence of acute or chronic DVT within the left lower extremity. Electronically Signed   By: Sandi Mariscal M.D.   On: 07/02/2015  12:30   Dg Chest Portable 1 View  07/11/2015  CLINICAL DATA:  Pt to ed with c/o coughing up blood today. Pt recently d/c from hospital, on blood thinners at home. Pt also c/o pain and swelling to left lower leg. Left leg red, with increased swelling and warm to touch. Pt also states swelling into groin area. EXAM: PORTABLE CHEST 1 VIEW COMPARISON:  05/27/2015 FINDINGS: Patient rotated left. Cardiomegaly accentuated by AP portable technique. New or increased left pleural effusion. No pneumothorax. Interstitial prominence and indistinctness is moderate, progressive. Patchy left lower and right upper lobe airspace opacities. IMPRESSION: Moderate congestive heart failure. Left pleural effusion, new or enlarged. Adjacent airspace disease could represent atelectasis or infection. There is also right upper lobe airspace disease, favoring pulmonary edema. Electronically Signed   By: Abigail Miyamoto M.D.   On: 07/11/2015 12:43   Echo 5.2016  - Left ventricle: The cavity size was normal. Systolic function was normal. The estimated ejection fraction was in the range of 50% to 55%. - Aortic valve: Valve area (Vmax): 2.79 cm^2. - Mitral valve: There was mild regurgitation. - Left atrium: The atrium was mildly dilated. - Right ventricle: The cavity size was mildly dilated. - Right atrium: The atrium was mildly dilated. - Tricuspid valve: There was moderate regurgitation. - Pericardium, extracardiac: A small to moderate, partially loculated pericardial effusion was identified posterior to the heart.  Impressions:  - The right ventricular systolic pressure was increased consistent with moderate pulmonary hypertension.  Assessment:   Tyler [name] is a 59 y.o. male with CHF, with echo in May showing prob Cor pulmonale with massive edema readmitted with recurrent cellulitis. His main issue is his edema.   Recommendations Change abx to ancef Elevate leg but he says cannot lay in bed So will  place L leg unawrap q 2 days for now Will likely be able to go home once edema controlled on oral abx. Thank you very much for allowing me to participate in the care of this patient. Please call with questions.   Cheral Marker. Ola Spurr, MD

## 2015-07-12 NOTE — Progress Notes (Signed)
Inpatient Diabetes Program Recommendations  AACE/ADA: New Consensus Statement on Inpatient Glycemic Control (2015)  Target Ranges:  Prepandial:   less than 140 mg/dL      Peak postprandial:   less than 180 mg/dL (1-2 hours)      Critically ill patients:  140 - 180 mg/dL   Review of Glycemic Control Results for [name], Koah A (MRN [id number]) as of 07/12/2015 11:57  Ref. Range 07/05/2015 07:25 07/05/2015 11:15 07/11/2015 21:15 07/12/2015 07:33 07/12/2015 11:31  Glucose-Capillary Latest Ref Range: 65-99 mg/dL 621185 (H) 308219 (H) 657199 (H) 186 (H) 199 (H)   Diabetes history: Type 2 Outpatient Diabetes medications: Novolon R,   Novolin R,  Novolog 0-9 units tid with meals Current orders for Inpatient glycemic control: Novolog 0-9 units tid with meals, Novolog 0-5 units qhs  Inpatient Diabetes Program Recommendations:  Per ADA recommendations "consider performing an A1C on all patients with diabetes or hyperglycemia admitted to the hospital if not performed in the prior 3 months". Last A1C was 7.6% on 04/08/15  Patient taking Novolin R 10 units bid breakfast and supper and at lunch time if he eats lunch- takes more or less depending on his sugar  Patient taking Novolin N 40 units bid with breakfast and supper - sometimes more and sometimes less depending on his sugar. (he decreased this from 80 units because he was having low blood sugars in the middle of the night)  This patient may do better on Lantus insulin once a day and Novolog with meals if cost was not a factor.  This would prevent patient from having low blood sugars caused by multiple insulins peaking at the same times (R taken at lunch and am N peaking at the same time or the peak of N insulin through the night).  It would us allow us to increase basal and/or mealtime insulins without the fear of having low blood sugars.   Will in hospital; 1. please consider starting the patient on Lantus 15 units qhs. (~0.1units/kg).  Will likely need  more- increase to attain fasting blood sugar less than 140mg /dl.    2. Consider starting the patient on Novolog 2 units tid with meals- continue Novolog correction as ordered.   Susette RacerJulie Alvaro Aungst, RN, BA, MHA, CDE Diabetes Coordinator Inpatient Diabetes Program  [phone] (Team Pager) [phone] [facility] Office) 07/12/2015 12:05 PM

## 2015-07-12 NOTE — Evaluation (Signed)
Physical Therapy Evaluation Patient Details Name: Tyler AusMichael A Mccanless MRN: [id number] DOB: [birth year] Today's Date: 07/12/2015   History of Present Illness  Pt is a 59 yo male who was admitted to the hospital for bilateral LE cellulitis   Clinical Impression  Pt presents with hx of CHF, COPD, DM, Gout, and CKD. Pt cleared by nursing to be seen after therapy brought up intermittent bloody cough. Pt states that this is not a new thing and that it often happens when the weather gets cooler. Examination reveals that pt performs transfers at min assist and ambulation of 80 ft on 3L O2 at CGA. Pt appears stable with transfers and ambulation once he gets into standing. He states that his wife and son assist him with transfers where he then performs household distance ambulation at baseline. He is very pleasant to work with this date and strongly wishes for therapy to continue to see him in order to keep him up and moving. Due to his decreased functional strength and endurance, he will continue to benefit from skilled PT in order to improve transfer ability cardiopulmonary endurance. When brought up, pt expressed no knowledge of his being in the COPD gold program and wished for more information regarding steps/programs to prevent COPD exacerbation.    Follow Up Recommendations Home health PT (pulmonary program for prevention of COPD exacerbation)    Equipment Recommendations  None recommended by PT    Recommendations for Other Services       Precautions / Restrictions Precautions Precautions: Fall Restrictions Weight Bearing Restrictions: No      Mobility  Bed Mobility Overal bed mobility:  (Pt in recliner; not assessed )                Transfers Overall transfer level: Needs assistance Equipment used:  (BRW) Transfers: Sit to/from Stand Sit to Stand: Mod assist;+2 safety/equipment         General transfer comment: Pt requires min assist for getting into standing, but largely  can perform the majority of the stand by using his own momentum. Once in standing pt is stable with no LOB   Ambulation/Gait Ambulation/Gait assistance: Min guard;+2 safety/equipment Ambulation Distance (Feet): 80 Feet Assistive device:  (BRW) Gait Pattern/deviations: Step-through pattern;Decreased step length - right;Decreased step length - left;Decreased stride length Gait velocity: decreased Gait velocity interpretation: Below normal speed for age/gender General Gait Details: Pt ambulates on 3L O2 [place] with increased time and one standing rest break. He is stable with ambulation and recognizes when he needs to sit due to fatigue. At rest on 3L O2 pt had O2 sats of 99% and following ambulation pt dropped to 93% (returned to 97% after 2 minutes)  Stairs            Wheelchair Mobility    Modified Rankin (Stroke Patients Only)       Balance Overall balance assessment: No apparent balance deficits (not formally assessed)                                           Pertinent Vitals/Pain Pain Assessment: 0-10 Pain Score: 4  Pain Location: Bilat LEs Pain Intervention(s): Limited activity within patient's tolerance;Monitored during session    Home Living Family/patient expects to be discharged to:: Private residence Living Arrangements: Spouse/significant other;Children;Other relatives Available Help at Discharge: Family Type of Home: Mobile home Home Access: Ramped entrance  Home Layout: One level Home Equipment: Walker - standard;Wheelchair - Manufacturing systems engineer;Wheelchair - power      Prior Function Level of Independence: Needs assistance   Gait / Transfers Assistance Needed: Pt needs +2 assist to get into standing at home. Once in standing is mod I with RW for household distances. Pt reports that this transfer/ambulation method has been working for him at home with no reported falls  ADL's / Homemaking Assistance Needed: Wife assists with household  ADLs.         Hand Dominance        Extremity/Trunk Assessment   Upper Extremity Assessment: Overall WFL for tasks assessed           Lower Extremity Assessment: Overall WFL for tasks assessed         Communication   Communication: No difficulties  Cognition Arousal/Alertness: Awake/alert Behavior During Therapy: WFL for tasks assessed/performed Overall Cognitive Status: Within Functional Limits for tasks assessed                      General Comments      Exercises Other Exercises Other Exercises: Pt performed bilateral therex x 12 reps at supervision for proper technique. Exercises included: ankle pumps, LAQ, hip marching, scapular retraction, and glute squeezes.       Assessment/Plan    PT Assessment Patient needs continued PT services  PT Diagnosis Difficulty walking (cariopulmonary impairments )   PT Problem List Decreased activity tolerance;Decreased mobility;Cardiopulmonary status limiting activity  PT Treatment Interventions DME instruction;Gait training;Stair training;Functional mobility training;Therapeutic activities;Balance training;Therapeutic exercise;Neuromuscular re-education   PT Goals (Current goals can be found in the Care Plan section) Acute Rehab PT Goals Patient Stated Goal: to return home PT Goal Formulation: With patient Time For Goal Achievement: 07/26/15 Potential to Achieve Goals: Good    Frequency Min 2X/week   Barriers to discharge        Co-evaluation               End of Session Equipment Utilized During Treatment: Gait belt;Oxygen Activity Tolerance: Patient tolerated treatment well Patient left: in chair;with call bell/phone within reach;with chair alarm set Nurse Communication: Mobility status (Need for COPD gold packet)         Time: 5284-1324 PT Time Calculation (min) (ACUTE ONLY): 28 min   Charges:         PT G CodesBenna Dunks 08-04-2015, 10:45 AM  Benna Dunks,  SPT. [phone]

## 2015-07-12 NOTE — Progress Notes (Signed)
ANTIBIOTIC CONSULT NOTE - INITIAL  Pharmacy Consult for Cefazolin Indication: cellulitis  Allergies  Allergen Reactions  . Enalapril Cough  . Atorvastatin Palpitations  . Lovastatin Palpitations    Patient Measurements: Height: 6' (182.9 cm) Weight: (!) 367 lb 3.2 oz (166.561 kg) IBW/kg (Calculated) : 77.6 Adjusted Body Weight:   Vital Signs: Temp: 97.5 F (36.4 C) (10/21 1445) Temp Source: Oral (10/21 1445) BP: 116/76 mmHg (10/21 1445) Pulse Rate: 70 (10/21 1445) Intake/Output from previous day: 10/20 0701 - 10/21 0700 In: 240 [P.O.:240] Out: 1800 [Urine:1800] Intake/Output from this shift: Total I/O In: 560 [P.O.:360; IV Piggyback:200] Out: 401 [Urine:400; Stool:1]  Labs:  Recent Labs  07/11/15 1257 07/12/15 0448  WBC 10.5 6.8  HGB 9.8* 9.3*  PLT 336 291  CREATININE 1.90* 1.83*   Estimated Creatinine Clearance: 69.6 mL/min (by C-G formula based on Cr of 1.83). No results for input(s): VANCOTROUGH, VANCOPEAK, VANCORANDOM, GENTTROUGH, GENTPEAK, GENTRANDOM, TOBRATROUGH, TOBRAPEAK, TOBRARND, AMIKACINPEAK, AMIKACINTROU, AMIKACIN in the last 72 hours.   Microbiology: No results found for this or any previous visit (from the past 720 hour(s)).  Medical History: Past Medical History  Diagnosis Date  . Cataract   . CHF (congestive heart failure) (HCC)   . COPD (chronic obstructive pulmonary disease) (HCC)   . Diabetes mellitus without complication (HCC)   . Sleep apnea   . Oxygen deficiency   . Thyroid disease   . Gout   . Hypothyroidism   . CKD (chronic kidney disease) stage 3, GFR 30-59 ml/min   . Asthma   . Hypertension     Medications:  Prescriptions prior to admission  Medication Sig Dispense Refill Last Dose  . acetaminophen (TYLENOL) 500 MG tablet Take 1,000 mg by mouth every 6 (six) hours as needed for mild pain or headache.   prn at prn  . alum & mag hydroxide-simeth (MAALOX/MYLANTA) 200-200-20 MG/5ML suspension Take 30 mLs by mouth every 4  (four) hours as needed for indigestion or heartburn. 355 mL 0 prn at prn  . ammonium lactate (LAC-HYDRIN) 12 % lotion Apply topically as needed for dry skin. 400 g 0 prn at prn  . cefUROXime (CEFTIN) 500 MG tablet Take 1 tablet (500 mg total) by mouth 2 (two) times daily with a meal. 20 tablet 0 07/11/2015 at Unknown time  . colchicine 0.6 MG tablet Take 0.6 mg by mouth as needed (for gout flares).    prn at prn  . cyanocobalamin (,VITAMIN B-12,) 1000 MCG/ML injection Inject 1,000 mcg into the muscle every 30 (thirty) days.   Past Month at Unknown time  . cyanocobalamin 2000 MCG tablet Take 2,000 mcg by mouth daily.   07/11/2015 at Unknown time  . fluticasone (FLONASE) 50 MCG/ACT nasal spray Place 2 sprays into both nostrils daily as needed for rhinitis.   prn at prn  . gabapentin (NEURONTIN) 100 MG capsule Take 100 mg by mouth 2 (two) times daily.   07/11/2015 at Unknown time  . hydrOXYzine (ATARAX/VISTARIL) 25 MG tablet Take 25-50 mg by mouth 3 (three) times daily as needed for itching.   prn at prn  . insulin aspart (NOVOLOG) 100 UNIT/ML injection Inject 0-9 Units into the skin 3 (three) times daily with meals. 10 mL 11 prn at prn  . insulin NPH Human (HUMULIN N,NOVOLIN N) 100 UNIT/ML injection Inject into the skin as needed (for high blood sugar). Pt states that he uses per sliding scale.   prn at prn  . insulin regular (NOVOLIN R,HUMULIN R) 100 units/mL  injection Inject into the skin as needed for high blood sugar. Pt states that he uses per sliding scale.   prn at prn  . ipratropium-albuterol (DUONEB) 0.5-2.5 (3) MG/3ML SOLN Take 3 mLs by nebulization every 4 (four) hours as needed. (Patient taking differently: Take 3 mLs by nebulization every 4 (four) hours as needed (for shortness of breath). ) 360 mL 5 prn at prn  . lactulose (CHRONULAC) 10 GM/15ML solution Take 20 g by mouth 2 (two) times daily as needed for mild constipation.    prn at prn  . levothyroxine (SYNTHROID, LEVOTHROID) 100 MCG  tablet Take 100 mcg by mouth daily.   07/11/2015 at Unknown time  . ondansetron (ZOFRAN) 4 MG tablet Take 1 tablet (4 mg total) by mouth every 6 (six) hours as needed for nausea. 20 tablet 0 prn at prn  . oxyCODONE-acetaminophen (PERCOCET/ROXICET) 5-325 MG tablet Take 2 tablets by mouth every 6 (six) hours as needed for moderate pain. 30 tablet 0 07/11/2015 at Unknown time  . PARoxetine (PAXIL) 20 MG tablet Take 20 mg by mouth daily.   07/11/2015 at Unknown time  . polyethylene glycol (MIRALAX / GLYCOLAX) packet Take 17 g by mouth daily as needed for mild constipation.    prn at prn  . pravastatin (PRAVACHOL) 40 MG tablet Take 40 mg by mouth at bedtime.   07/10/2015 at Unknown time  . salmeterol (SEREVENT) 50 MCG/DOSE diskus inhaler Inhale 1 puff into the lungs 2 (two) times daily as needed (for shortness of breath).   07/11/2015 at Unknown time  . spironolactone (ALDACTONE) 25 MG tablet Take 25 mg by mouth 2 (two) times daily.   07/11/2015 at Unknown time  . tamsulosin (FLOMAX) 0.4 MG CAPS capsule Take 1 capsule (0.4 mg total) by mouth daily. 30 capsule 5 07/11/2015 at Unknown time  . torsemide (DEMADEX) 100 MG tablet Take 50 mg by mouth daily.   07/11/2015 at Unknown time  . warfarin (COUMADIN) 10 MG tablet Take 5-10 mg by mouth every evening. Pt takes 5mg  on Tuesday, Wednesday, Thursday, Saturday, and Sunday.   Pt takes 10mg  on Monday and Friday.   07/10/2015 at Unknown time   Assessment: Pharmacy consulted to dose cefazolin in this [age] year old male admitted with cellulitis.  Failed ceftin as outpt. CrCl = 69.6 ml/min,  TBW = 166.6 kg  Goal of Therapy:  resolution of infection  Plan:  Expected duration 7 days with resolution of temperature and/or normalization of WBC  Cefazolin 2 gm IV Q8H originally ordered.  Will adjust dose to cefazolin 1 gm Q8H based on indication and dosing guidelines per uptodateonline.   Jaydee Conran D 07/12/2015,3:39 PM

## 2015-07-12 NOTE — Progress Notes (Signed)
ANTIBIOTIC CONSULT NOTE - Follow up  Pharmacy Consult for Vancomycin and Zosyn Indication: Cellulitis   Allergies  Allergen Reactions  . Enalapril Cough  . Atorvastatin Palpitations  . Lovastatin Palpitations    Patient Measurements: Height: 6' (182.9 cm) Weight: (!) 367 lb 3.2 oz (166.561 kg) IBW/kg (Calculated) : 77.6 Adjusted Body Weight: 113.7 kg  Vital Signs: Temp: 98.4 F (36.9 C) (10/21 0540) Temp Source: Oral (10/21 0540) BP: 113/75 mmHg (10/21 0804) Pulse Rate: 73 (10/21 0804) Intake/Output from previous day: 10/20 0701 - 10/21 0700 In: 240 [P.O.:240] Out: 1800 [Urine:1800] Intake/Output from this shift: Total I/O In: 200 [IV Piggyback:200] Out: 1 [Stool:1]  Labs:  Recent Labs  07/11/15 1257 07/12/15 0448  WBC 10.5 6.8  HGB 9.8* 9.3*  PLT 336 291  CREATININE 1.90* 1.83*   Estimated Creatinine Clearance: 69.6 mL/min (by C-G formula based on Cr of 1.83).   Microbiology: No results found for this or any previous visit (from the past 720 hour(s)).  Medical History: Past Medical History  Diagnosis Date  . Cataract   . CHF (congestive heart failure) (HCC)   . COPD (chronic obstructive pulmonary disease) (HCC)   . Diabetes mellitus without complication (HCC)   . Sleep apnea   . Oxygen deficiency   . Thyroid disease   . Gout   . Hypothyroidism   . CKD (chronic kidney disease) stage 3, GFR 30-59 ml/min   . Asthma   . Hypertension     Medications:  Scheduled:  . antiseptic oral rinse  7 mL Mouth Rinse BID  . docusate sodium  100 mg Oral BID  . furosemide  40 mg Intravenous BID  . gabapentin  100 mg Oral BID  . insulin aspart  0-5 Units Subcutaneous QHS  . insulin aspart  0-9 Units Subcutaneous TID WC  . levothyroxine  100 mcg Oral QAC breakfast  . PARoxetine  20 mg Oral Daily  . piperacillin-tazobactam (ZOSYN)  IV  4.5 g Intravenous 3 times per day  . pravastatin  40 mg Oral QHS  . sodium chloride  3 mL Intravenous Q12H  . spironolactone   25 mg Oral BID  . tamsulosin  0.4 mg Oral Daily  . torsemide  50 mg Oral Daily  . vancomycin  1,000 mg Intravenous Q12H   Assessment: Pharmacy consulted to assist in the dosing of Vancomycin and Zosyn therapy in this 59 year old male being treated for cellulitis. Patient has past medical history significant for diabetes, htn, and CHF. Patient recently in the hospital from Oct 10-14 and was treated with Unasyn and discharged on Ceftin. Cellulitis has worsen despite still taking Ceftin.   PK parameters:  VD: 79.6; t1/2: 12.8 hours; ke= 0.0603   Goal of Therapy:  Vancomycin trough level 10-15 mcg/ml  Plan:  Will give Vancomycin 1250 mg IV x 1 and will then start Vancomycin 1 g IV q12 hours starting @ 00:00 on 10/21. Trough level ordered to be drawn prior to the 12:00 dose on 10/22.   Will start Zosyn 4.5 g IV q8 hours since patient's body weight is >120 kg.   Pharmacy to follow and assist in the management of antimicrobial therapy in this patient.    Bari MantisKristin Dashley Monts PharmD Clinical Pharmacist 07/12/2015 8:37 AM

## 2015-07-12 NOTE — Care Management Note (Signed)
Case Management Note  Patient Details  Name: [name] MRN: [id number] Date of Birth: [birth year]  Subjective/Objective:        Recommnend  PT RN CSW and Telehealth.            Action/Plan Expected Discharge Date:                  Expected Discharge Plan:  Home w Home Health Services  In-House Referral:     Discharge planning Services  CM Consult  Post Acute Care Choice:    Choice offered to:  NA (No preference)  DME Arranged:    DME Agency:     HH Arranged:  RN, PT, Social Work [employer] Agency:  [facility]  Status of Service:  In process, will continue to follow  Medicare Important Message Given:    Date Medicare IM Given:    Medicare IM give by:    Date Additional Medicare IM Given:    Additional Medicare Important Message give by:     If discussed at Long Length of Stay Meetings, dates discussed:    Additional Comments:  Adonis HugueninBerkhead, Uldine Fuster L, RN 07/12/2015, 4:42 PM

## 2015-07-13 LAB — CREATININE, SERUM
Creatinine, Ser: 1.81 mg/dL — ABNORMAL HIGH (ref 0.61–1.24)
GFR calc Af Amer: 45 mL/min — ABNORMAL LOW (ref >60)
GFR, EST NON AFRICAN AMERICAN: 39 mL/min — AB (ref >60)

## 2015-07-13 LAB — GLUCOSE, CAPILLARY
GLUCOSE-CAPILLARY: 156 mg/dL — AB (ref 65–99)
GLUCOSE-CAPILLARY: 177 mg/dL — AB (ref 65–99)
Glucose-Capillary: 159 mg/dL — ABNORMAL HIGH (ref 65–99)
Glucose-Capillary: 180 mg/dL — ABNORMAL HIGH (ref 65–99)

## 2015-07-13 LAB — VANCOMYCIN, TROUGH: VANCOMYCIN TR: 16 ug/mL (ref 10–20)

## 2015-07-13 LAB — PROTIME-INR
INR: 5.34 — AB
Prothrombin Time: 48.6 seconds — ABNORMAL HIGH (ref 11.4–15.0)

## 2015-07-14 LAB — GLUCOSE, CAPILLARY
GLUCOSE-CAPILLARY: 158 mg/dL — AB (ref 65–99)
GLUCOSE-CAPILLARY: 179 mg/dL — AB (ref 65–99)
Glucose-Capillary: 175 mg/dL — ABNORMAL HIGH (ref 65–99)
Glucose-Capillary: 169 mg/dL — ABNORMAL HIGH (ref 65–99)

## 2015-07-14 NOTE — Progress Notes (Signed)
During day shift RN tried to get in touch with Wound care nurse to ask questions about placement of unna boots. Wound care nurse was paged several times and did not hear back from her. Tried to page Dr. Judithann SheenSparks several times and did not hear back from him. Paged MD on call for Dr. Judithann SheenSparks he said that he was not signed off on and would not be able to help with the patient. Pt still does not have Unna boots placed because of questions as to medicated or non medicated as well as directions on placement. Notified night shift RN and hopefully will get in touch with MD in AM. Charge nurse notified.

## 2015-07-14 NOTE — Progress Notes (Signed)
[facility] [place] [facility]   PATIENT NAME: Tyler [name]    MR#:  [id number]  DATE OF BIRTH:  1956/07/12  SUBJECTIVE:  CHIEF COMPLAINT:   Chief Complaint  Patient presents with  . Hemoptysis   left leg pain  He currently has some complaints in regards to the left lower extremity pain and edema states this is somewhat improved since arrival however still present. Pain described only as "pain" worsened by activity and standing he states to the point where he is unable to ambulate at this time denies fevers or chills denies further symptomatology. Has had no further episodes of hemoptysis REVIEW OF SYSTEMS:  ROS REVIEW OF SYSTEMS:  CONSTITUTIONAL: Denies fevers, chills, fatigue, weakness.  EYES: Denies blurred vision, double vision, or eye pain.  EARS, NOSE, THROAT: Denies tinnitus, ear pain, hearing loss.  RESPIRATORY: denies cough, shortness of breath, wheezing  CARDIOVASCULAR: Denies chest pain, palpitations, positive edema.  GASTROINTESTINAL: Denies nausea, vomiting, diarrhea, abdominal pain.  GENITOURINARY: Denies dysuria, hematuria.  ENDOCRINE: Denies nocturia or thyroid problems. HEMATOLOGIC AND LYMPHATIC: Denies easy bruising or bleeding.  SKIN: Positive rash left leg  MUSCULOSKELETAL: Denies pain in neck, back, shoulder, knees, hips, or further arthritic symptoms.  NEUROLOGIC: Denies paralysis, paresthesias.  PSYCHIATRIC: Denies anxiety or depressive symptoms. Otherwise full review of systems performed by me is negative.   DRUG ALLERGIES:   Allergies  Allergen Reactions  . Enalapril Cough  . Atorvastatin Palpitations  . Lovastatin Palpitations   VITALS:  Blood pressure 117/65, pulse 68, temperature 97.5 F (36.4 C), temperature source Oral, resp. rate 18, height 6' (1.829 m), weight 365 lb 9.6 oz (165.835 kg), SpO2 92 %. PHYSICAL EXAMINATION:  Physical Exam  VITAL SIGNS: Filed Vitals:   07/14/15 2043  BP: 117/65  Pulse:  68  Temp: 97.5 F (36.4 C)  Resp: 18   GENERAL:59 y.o.male currently in no acute distress.  HEAD: Normocephalic, atraumatic.  EYES: Pupils equal, round, reactive to light. Extraocular muscles intact. No scleral icterus.  MOUTH: Moist mucosal membrane. Dentition intact. No abscess noted.  EAR, NOSE, THROAT: Clear without exudates. No external lesions.  NECK: Supple. No thyromegaly. No nodules. No JVD.  PULMONARY: Diminished breath sounds secondary to body habituswithout wheeze rails or rhonci. No use of accessory muscles, Good respiratory effort. good air entry bilaterally CHEST: Nontender to palpation.  CARDIOVASCULAR: S1 and S2. Regular rate and rhythm. No murmurs, rubs, or gallops. 3+ edema. Pedal pulses 2+ bilaterally.  GASTROINTESTINAL: Soft, nontender, nondistended. No masses. Positive bowel sounds. No hepatosplenomegaly.  MUSCULOSKELETAL: No swelling, clubbing, or edema. Range of motion full in all extremities.  NEUROLOGIC: Cranial nerves II through XII are intact. No gross focal neurological deficits. Sensation intact. Reflexes intact.  SKIN: Left lower extremity erythema dorsum of foot extending proximally to the knee warm to touch, No further ulceration, lesions, rashes, or cyanosis. Skin warm and dry. Turgor intact.  PSYCHIATRIC: Mood, affect within normal limits. The patient is awake, alert and oriented x 3. Insight, judgment intact.   LABORATORY PANEL:   CBC  Recent Labs Lab 07/12/15 0448  WBC 6.8  HGB 9.3*  HCT 28.9*  PLT 291   ------------------------------------------------------------------------------------------------------------------ Chemistries   Recent Labs Lab 07/12/15 0448 07/13/15 0530  NA 133*  --   K 4.1  --   CL 99*  --   CO2 27  --   GLUCOSE 184*  --   BUN 60*  --   CREATININE 1.83* 1.81*  CALCIUM 8.0*  --  RADIOLOGY:  No results found. ASSESSMENT AND PLAN:   59 year old Caucasian gentleman originally admitted on 07/11/2015 for  continuing leg edema warmth as well as an episode of hemoptysis  1. Left lower extremity cellulitis, failure of outpatient treatment: Continue Ancef as per ID recommendations, consult wound therapy to hopefully aid in treating his lymphedema 2. Supratherapeutic INR: Recheck INR as it appears to not have been checked lately follow and dose Coumadin levels appropriately 3. Type 2 diabetes insulin requiring: Continue the basal insulin, add insulin sliding scale with Accu-Cheks 4. hyperlipidemia unspecified: Statin therapy 5. Venous thromboembolism prophylactic: Therapeutic warfarin, check INR     All the records are reviewed and case discussed with Care Management/Social Workerr. Management plans discussed with the patient, family and they are in agreement.  CODE STATUS: Full  TOTAL TIME TAKING CARE OF THIS PATIENT: 35 minutes.   More than 50% of the time was spent in counseling/coordination of care: YES  POSSIBLE D/C IN 2-3 DAYS, DEPENDING ON CLINICAL CONDITION.   Hower,  Mardi Mainland.D on 07/14/2015 at 9:35 PM  Between 7am to 6pm - Pager - [phone]  After 6pm go to [email or web address] - password [email or web address] [facility]  [place] [facility]  Office  [phone]  CC:  Primary care physician; Marguarite Arbour, MD

## 2015-07-15 ENCOUNTER — Other Ambulatory Visit: Payer: Self-pay | Admitting: *Deleted

## 2015-07-15 LAB — GLUCOSE, CAPILLARY
Glucose-Capillary: 152 mg/dL — ABNORMAL HIGH (ref 65–99)
Glucose-Capillary: 208 mg/dL — ABNORMAL HIGH (ref 65–99)
Glucose-Capillary: 167 mg/dL — ABNORMAL HIGH (ref 65–99)
Glucose-Capillary: 181 mg/dL — ABNORMAL HIGH (ref 65–99)

## 2015-07-15 LAB — BASIC METABOLIC PANEL
Anion gap: 11 (ref 5–15)
BUN: 58 mg/dL — AB (ref 6–20)
CALCIUM: 8.3 mg/dL — AB (ref 8.9–10.3)
CO2: 26 mmol/L (ref 22–32)
Chloride: 95 mmol/L — ABNORMAL LOW (ref 101–111)
Creatinine, Ser: 1.69 mg/dL — ABNORMAL HIGH (ref 0.61–1.24)
GFR calc Af Amer: 49 mL/min — ABNORMAL LOW (ref >60)
GFR, EST NON AFRICAN AMERICAN: 43 mL/min — AB (ref >60)
GLUCOSE: 145 mg/dL — AB (ref 65–99)
Potassium: 3.8 mmol/L (ref 3.5–5.1)
SODIUM: 132 mmol/L — AB (ref 135–145)

## 2015-07-15 LAB — CBC
HCT: 29.6 % — ABNORMAL LOW (ref 40.0–52.0)
HEMOGLOBIN: 9.7 g/dL — AB (ref 13.0–18.0)
MCH: 25.9 pg — ABNORMAL LOW (ref 26.0–34.0)
MCHC: 32.6 g/dL (ref 32.0–36.0)
MCV: 79.3 fL — ABNORMAL LOW (ref 80.0–100.0)
Platelets: 280 10*3/uL (ref 150–440)
RBC: 3.74 MIL/uL — ABNORMAL LOW (ref 4.40–5.90)
RDW: 19.1 % — AB (ref 11.5–14.5)
WBC: 6.8 10*3/uL (ref 3.8–10.6)

## 2015-07-15 LAB — PROTIME-INR
INR: 3.38
PROTHROMBIN TIME: 34.2 s — AB (ref 11.4–15.0)

## 2015-07-15 MED ORDER — ASPIRIN EC 81 MG PO TBEC
81.0000 mg | DELAYED_RELEASE_TABLET | Freq: Every day | ORAL | Status: DC
  Administered 2015-07-15 – 2015-07-17 (×3): 81 mg via ORAL
  Filled 2015-07-15 (×3): qty 1

## 2015-07-15 NOTE — Care Management Important Message (Signed)
Important Message  Patient Details  Name: Tyler AusMichael A [name] MRN: [id number] Date of Birth: 1956/06/30   Medicare Important Message Given:  Yes-second notification given    [name] 07/15/2015, 10:02 AM

## 2015-07-15 NOTE — Progress Notes (Signed)
Physical Therapy Treatment Patient Details Name: Tyler AusMichael A [name] MRN: [id number] DOB: 12/04/1955 Today's Date: 07/15/2015    History of Present Illness Pt is a 59 yo male who was admitted to the hospital for bilateral LE cellulitis     PT Comments    Pt making great progress towards ambulation this afternoon with greater distance walked and two repetitions ambulated at that distance. He is tolerating ambulation well given his cardiopulmonary status and the state of his LEs. Pt appears much more pleasant than usual and receptive to therapy and education. Due to his acute pain and endurance deficits, pt will continue to benefit from skilled PT in order to eventually return home safely. Pt has not been issued a COPD gold packet, therefore therapist issued packet and provided education on the contents of the packet and the program. Pt expresses wish to partake in outpatient services to help with his "breathing"  Follow Up Recommendations  Home health PT (pulmonary program for prevention of COPD exacerbation)     Equipment Recommendations  None recommended by PT    Recommendations for Other Services       Precautions / Restrictions Precautions Precautions: Fall Restrictions Weight Bearing Restrictions: No    Mobility  Bed Mobility Overal bed mobility: Needs Assistance Bed Mobility: Supine to Sit     Supine to sit: Mod assist     General bed mobility comments: To get into sitting pt needs assist from therapist to pull his trunk upright. He needs minor assist for management of LEs  Transfers Overall transfer level: Needs assistance Equipment used:  (BRW) Transfers: Sit to/from Stand Sit to Stand: Min assist;+2 safety/equipment         General transfer comment: Pt requires min assist for getting into standing, but largely can perform the majority of the stand by using his own momentum. Once in standing pt is stable with no LOB   Ambulation/Gait Ambulation/Gait  assistance: Min guard Ambulation Distance (Feet): 100 Feet (x 2 sets) Assistive device:  (BRW) Gait Pattern/deviations: Step-through pattern;Decreased dorsiflexion - right;Decreased dorsiflexion - left;Trunk flexed Gait velocity: decreased Gait velocity interpretation: Below normal speed for age/gender General Gait Details: Pt ambulated on 3L O2 [place] and completed two sets of 100 ft ambulation. Prior to ambulation on 3L pt was at 98% O2 saturation. Pt fell to 92% O2 during ambulation and recovered to 95% within 3 minutes of resting in sitting. Pt demonstrated greater endurance this date.   Stairs            Wheelchair Mobility    Modified Rankin (Stroke Patients Only)       Balance Overall balance assessment: No apparent balance deficits (not formally assessed)                                  Cognition Arousal/Alertness: Awake/alert Behavior During Therapy: WFL for tasks assessed/performed Overall Cognitive Status: Within Functional Limits for tasks assessed                      Exercises      General Comments        Pertinent Vitals/Pain Pain Assessment: No/denies pain    Home Living                      Prior Function            PT Goals (current goals can now be  found in the care plan section) Acute Rehab PT Goals Patient Stated Goal: to return home PT Goal Formulation: With patient Time For Goal Achievement: 07/26/15 Potential to Achieve Goals: Good Progress towards PT goals: Progressing toward goals    Frequency  Min 2X/week    PT Plan Current plan remains appropriate    Co-evaluation             End of Session Equipment Utilized During Treatment: Gait belt;Oxygen Activity Tolerance: Patient tolerated treatment well Patient left: in chair;with call bell/phone within reach;with chair alarm set     Time: 1425-1455 PT Time Calculation (min) (ACUTE ONLY): 30 min  Charges:                       G CodesBenna Dunks 2015-08-04, 4:07 PM  Benna Dunks, SPT. [phone]

## 2015-07-15 NOTE — Consult Note (Signed)
WOC wound consult note Reason for Consult: Cellulitis to left leg.  (-) DVT. Bilateral application of UNNAS boots today.  Wears removable compression at home.  Wife helps to remove and apply.  Wound type:Infectious Large blood filled blister to right heel, consistent with deep tissue injury to medial aspect.  Intact at this time.  Pressure Ulcer POA: Yes Measurement: 4 cm x 6 cm x +1.2 cm blood filled blister Wound WGN:FAOZHYbed:Maroon discoloration Drainage (amount, consistency, odor) None at this time.  Periwound:Erythema to bilateral lower legs.  Nonintact, scabbed lesion to right dorsal foot at bend of anterior leg.  Dressing procedure/placement/frequency:Cleanse bilateral lower legs with soap and water and pat gently dry.  APply silver hydrofiber to right heel injury.  ZInc layer from base of toes to just below knee.  Secure with COban.  (2 rolls per leg).  Change Mon/Wed/Fri WOC team will follow.  Maple HudsonKaren Arin Vanosdol RN BSN CWON Pager [phone]

## 2015-07-15 NOTE — Progress Notes (Signed)
Tyler [name] is a 59 y.o. male  <principal problem not specified>   SUBJECTIVE:  Pt in NAD. Still with massive lymphedema and redness. Afebrile. Labs pending. Respiratory status is stable.  ______________________________________________________________________  ROS: Review of systems is unremarkable for any active cardiac,respiratory, GI, GU, hematologic, neurologic or psychiatric systems, 10 systems reviewed.  @CMEDLIST @  Past Medical History  Diagnosis Date  . Cataract   . CHF (congestive heart failure) (HCC)   . COPD (chronic obstructive pulmonary disease) (HCC)   . Diabetes mellitus without complication (HCC)   . Sleep apnea   . Oxygen deficiency   . Thyroid disease   . Gout   . Hypothyroidism   . CKD (chronic kidney disease) stage 3, GFR 30-59 ml/min   . Asthma   . Hypertension     Past Surgical History  Procedure Laterality Date  . Cholecystectomy      2002  . Eye surgery      right cataract removed 3/21  . Vena cava filter placement      PHYSICAL EXAM:  BP 111/58 mmHg  Pulse 69  Temp(Src) 97.5 F (36.4 C) (Oral)  Resp 16  Ht 6' (1.829 m)  Wt 163.839 kg (361 lb 3.2 oz)  BMI 48.98 kg/m2  SpO2 96%  Wt Readings from Last 3 Encounters:  07/15/15 163.839 kg (361 lb 3.2 oz)  07/05/15 139.935 kg (308 lb 8 oz)  06/13/15 165.019 kg (363 lb 12.8 oz)            Constitutional: NAD Neck: supple, no thyromegaly Respiratory: basilar rales Cardiovascular: irregularly irregular, no murmur, no gallop Abdomen: soft, good BS, nontender Extremities: 3+ edema with erythema Neuro: alert and oriented, no focal motor or sensory deficits  ASSESSMENT/PLAN:  Labs and imaging studies were reviewed  Will continue IV ABX. F/u on labs. Awaiting wound care consult. CM consult for D/C planning.

## 2015-07-15 NOTE — Patient Outreach (Signed)
[facility] Care Management  07/15/2015  Tyler [name] 1956/07/15 [id number]   Phone call to patient to assess for continued social work need.  Voicemail message left for a return call.   Adriana ReamsChrystal Land, LCSW [facility] Care Management [phone]

## 2015-07-15 NOTE — Progress Notes (Signed)
Spoke with Dr. Judithann SheenSparks about wound care coming by to see patient and latest results for PT and INR. Patient has no current complaints of pain. Continue to assess.

## 2015-07-16 LAB — CBC WITH DIFFERENTIAL/PLATELET
BASOS ABS: 0.1 10*3/uL (ref 0–0.1)
Basophils Relative: 1 %
Eosinophils Absolute: 0.1 10*3/uL (ref 0–0.7)
Eosinophils Relative: 1 %
HEMATOCRIT: 30.4 % — AB (ref 40.0–52.0)
HEMOGLOBIN: 9.6 g/dL — AB (ref 13.0–18.0)
LYMPHS PCT: 11 %
Lymphs Abs: 0.7 10*3/uL — ABNORMAL LOW (ref 1.0–3.6)
MCH: 25.1 pg — ABNORMAL LOW (ref 26.0–34.0)
MCHC: 31.6 g/dL — ABNORMAL LOW (ref 32.0–36.0)
MCV: 79.4 fL — AB (ref 80.0–100.0)
MONO ABS: 0.6 10*3/uL (ref 0.2–1.0)
Monocytes Relative: 9 %
Neutro Abs: 4.7 10*3/uL (ref 1.4–6.5)
Neutrophils Relative %: 78 %
Platelets: 267 10*3/uL (ref 150–440)
RBC: 3.82 MIL/uL — AB (ref 4.40–5.90)
RDW: 19.6 % — ABNORMAL HIGH (ref 11.5–14.5)
WBC: 6.1 10*3/uL (ref 3.8–10.6)

## 2015-07-16 LAB — BASIC METABOLIC PANEL
ANION GAP: 10 (ref 5–15)
BUN: 60 mg/dL — ABNORMAL HIGH (ref 6–20)
CO2: 26 mmol/L (ref 22–32)
Calcium: 8.3 mg/dL — ABNORMAL LOW (ref 8.9–10.3)
Chloride: 95 mmol/L — ABNORMAL LOW (ref 101–111)
Creatinine, Ser: 1.71 mg/dL — ABNORMAL HIGH (ref 0.61–1.24)
GFR calc Af Amer: 49 mL/min — ABNORMAL LOW (ref >60)
GFR calc non Af Amer: 42 mL/min — ABNORMAL LOW (ref >60)
GLUCOSE: 179 mg/dL — AB (ref 65–99)
POTASSIUM: 3.8 mmol/L (ref 3.5–5.1)
Sodium: 131 mmol/L — ABNORMAL LOW (ref 135–145)

## 2015-07-16 LAB — GLUCOSE, CAPILLARY
GLUCOSE-CAPILLARY: 281 mg/dL — AB (ref 65–99)
Glucose-Capillary: 214 mg/dL — ABNORMAL HIGH (ref 65–99)
Glucose-Capillary: 175 mg/dL — ABNORMAL HIGH (ref 65–99)
Glucose-Capillary: 197 mg/dL — ABNORMAL HIGH (ref 65–99)

## 2015-07-16 LAB — PROTIME-INR
INR: 3.05
Prothrombin Time: 31.6 seconds — ABNORMAL HIGH (ref 11.4–15.0)

## 2015-07-16 NOTE — Clinical Documentation Improvement (Signed)
Internal Medicine  Patient's BMI > 45, please provide clinical diagnosis for BMI if appropriate for this admission? Thank you     Morbid Obesity due to excess calories   Obesity  Other  Clinically Undetermined    Please exercise your independent, professional judgment when responding. A specific answer is not anticipated or expected.   Thank You, Lavonda JumboLawanda J [place] [facility] Information Management [place] [phone]

## 2015-07-16 NOTE — Progress Notes (Signed)
ANTIBIOTIC CONSULT NOTE - Follow up  Pharmacy Consult for Cefazolin Indication: cellulitis  Allergies  Allergen Reactions  . Enalapril Cough  . Atorvastatin Palpitations  . Lovastatin Palpitations    Patient Measurements: Height: 6' (182.9 cm) Weight: (!) 355 lb 9.6 oz (161.3 kg) IBW/kg (Calculated) : 77.6   Vital Signs: Temp: 98.1 F (36.7 C) (10/25 0501) Temp Source: Oral (10/25 0501) BP: 106/65 mmHg (10/25 0501) Pulse Rate: 62 (10/25 0501) Intake/Output from previous day: 10/24 0701 - 10/25 0700 In: 360 [P.O.:360] Out: 650 [Urine:650] Intake/Output from this shift:    Labs:  Recent Labs  07/15/15 0501 07/16/15 0651  WBC 6.8 6.1  HGB 9.7* 9.6*  PLT 280 267  CREATININE 1.69* 1.71*   Estimated Creatinine Clearance: 73.1 mL/min (by C-G formula based on Cr of 1.71).  Recent Labs  07/13/15 1121  VANCOTROUGH 16     Microbiology: No results found for this or any previous visit (from the past 720 hour(s)).  Medical History: Past Medical History  Diagnosis Date  . Cataract   . CHF (congestive heart failure) (HCC)   . COPD (chronic obstructive pulmonary disease) (HCC)   . Diabetes mellitus without complication (HCC)   . Sleep apnea   . Oxygen deficiency   . Thyroid disease   . Gout   . Hypothyroidism   . CKD (chronic kidney disease) stage 3, GFR 30-59 ml/min   . Asthma   . Hypertension     Assessment: Pharmacy consulted to dose cefazolin in this 59 year old male admitted with cellulitis.  Failed ceftin as outpt. CrCl = 73.1 ml/min  Goal of Therapy:  resolution of infection  Plan:  Expected duration 7 days with resolution of temperature and/or normalization of WBC  Will continue current orders for cefazolin 1 gm IV Q8H.   Crist FatWang, Jovane Foutz L 07/16/2015,8:04 AM

## 2015-07-16 NOTE — Progress Notes (Signed)
[facility] Date of Admission:  07/11/2015     ID: Tyler AusMichael A [name] is a 59 y.o. male with  LE edema and cellulitis  Active Problems:   Left leg cellulitis   Subjective: Wraps placed yest, says legs feeling better. Lasix held due to Low BP per pt report. Sitting in chair without feet elevated  ROS  Eleven systems are reviewed and negative except per hpi  Medications:  Antibiotics Given (last 72 hours)    Date/Time Action Medication Dose Rate   07/13/15 2147 Given   ceFAZolin (ANCEF) IVPB 1 g/50 mL premix 1 g 100 mL/hr   07/14/15 0752 Given   ceFAZolin (ANCEF) IVPB 1 g/50 mL premix 1 g 100 mL/hr   07/14/15 1706 Given   ceFAZolin (ANCEF) IVPB 1 g/50 mL premix 1 g 100 mL/hr   07/15/15 0013 Given   ceFAZolin (ANCEF) IVPB 1 g/50 mL premix 1 g 100 mL/hr   07/15/15 2134 Given   ceFAZolin (ANCEF) IVPB 1 g/50 mL premix 1 g 100 mL/hr   07/16/15 0525 Given   ceFAZolin (ANCEF) IVPB 1 g/50 mL premix 1 g 100 mL/hr   07/16/15 1509 Given   ceFAZolin (ANCEF) IVPB 1 g/50 mL premix 1 g 100 mL/hr     . antiseptic oral rinse  7 mL Mouth Rinse BID  . aspirin EC  81 mg Oral Daily  .  ceFAZolin (ANCEF) IV  1 g Intravenous 3 times per day  . docusate sodium  100 mg Oral BID  . furosemide  40 mg Intravenous BID  . gabapentin  100 mg Oral BID  . insulin aspart  0-5 Units Subcutaneous QHS  . insulin aspart  0-9 Units Subcutaneous TID WC  . levothyroxine  100 mcg Oral QAC breakfast  . PARoxetine  20 mg Oral Daily  . pravastatin  40 mg Oral QHS  . sodium chloride  3 mL Intravenous Q12H  . spironolactone  25 mg Oral BID  . tamsulosin  0.4 mg Oral Daily  . torsemide  50 mg Oral Daily    Objective: Vital signs in last 24 hours: Temp:  [97.4 F (36.3 C)-98.1 F (36.7 C)] 97.4 F (36.3 C) (10/25 1202) Pulse Rate:  [55-65] 55 (10/25 1202) Resp:  [16-20] 16 (10/25 1202) BP: (102-118)/(62-70) 110/65 mmHg (10/25 1202) SpO2:  [95 %-100 %] 100 % (10/25  1202) Weight:  [161.3 kg (355 lb 9.6 oz)] 161.3 kg (355 lb 9.6 oz) (10/25 0525) Constitutional: He is oriented to person, place, and time. morbnidly obese, ill apperaing, on O2 HENT: Mouth/Throat: Oropharynx is clear and moist. No oropharyngeal exudate.  Cardiovascular: Normal rate, 3/6 sm Pulmonary/Chest: poor air movment.  Abdominal: Soft. Bowel sounds are normal. obese Lymphadenopathy: He has no cervical adenopathy.  Neurological: He is alert and oriented to person, place, and time.  Skin: covered with unawrap EXT - bil leg in unawraps Psychiatric: He has a normal mood and affect. His behavior is normal.  Lab Results  Recent Labs  07/15/15 0501 07/16/15 0651  WBC 6.8 6.1  HGB 9.7* 9.6*  HCT 29.6* 30.4*  NA 132* 131*  K 3.8 3.8  CL 95* 95*  CO2 26 26  BUN 58* 60*  CREATININE 1.69* 1.71*    Microbiology: Results for orders placed or performed in visit on 08/21/14  ED Influenza     Status: None   Collection Time: 08/21/14  4:53 PM  Result Value Ref Range Status   Influenza A By PCR  NEGATIVE NEGATIVE Final   Influenza B By PCR NEGATIVE NEGATIVE Final   H1N1 flu by pcr NOT DETECTED NOT-DETECTED Final    Comment:                  ----------------------- The Xpert Flu assay (FDA approval for nasal aspirates or washes and nasopharyngeal swab specimens), is intended as an aid in the diagnosis of influenza and should not be used as a sole basis for treatment.     Studies/Results:  Assessment/Plan: Tyler [name] is a 59 y.o. male with CHF, with echo in May showing prob Cor pulmonale with massive edema readmitted with recurrent cellulitis. His main issue is his edema.   Recommendations At dc can change  Ancef to keflex 500 tid for 14 more days Elevate leg but he says cannot lay in bed Continue  unawrap q 2 days for now - at dc can be done every 5 days Continue diuresis I can see 1-2 weeks post discharge. May need suppressive abx until edema improved and skin  healthier Thank you very much for the consult. Will follow with you.  [name]   07/16/2015, 3:48 PM

## 2015-07-16 NOTE — Progress Notes (Signed)
Physical Therapy Treatment Patient Details Name: Tyler AusMichael A [name] MRN: [id number] DOB: Jan 30, 1956 Today's Date: 07/16/2015    History of Present Illness Pt is a 59 yo male who was admitted to the hospital for bilateral LE cellulitis     PT Comments    Pt making progress towards goals and is now able to ambulate a greater distance within his ambulation reps before needing to take a break. Pt continues to show good O2 saturation response (reference ambulation section). He is motivated to walk with therapy and perform multiple repetitions of ambulation. It is also noted that pt's ambulation speed is increasing and the pain in his LEs is well managed. Due to his cardiopulmonary impairments and mobility deficits, he will continue to benefit from skilled PT to return him to PLOF. Cardiopulmonary rehabilitation will also greatly benefit this pt for prevention of readmission to the hospital secondary to COPD exacerbation. Pt is very interested in our [facility] program to assist him with his breathing during ADLs.   Follow Up Recommendations  Home health PT;Other (comment) (Cardiopulmonary rehabilitation ([facility]))     Equipment Recommendations  None recommended by PT    Recommendations for Other Services       Precautions / Restrictions Precautions Precautions: Fall Restrictions Weight Bearing Restrictions: No    Mobility  Bed Mobility Overal bed mobility: Needs Assistance Bed Mobility: Supine to Sit     Supine to sit: Mod assist     General bed mobility comments: To get into sitting pt needs assist from therapist to pull his trunk upright. Pt does not require assist for LEs this date  Transfers Overall transfer level: Needs assistance Equipment used:  (BRW) Transfers: Sit to/from Stand Sit to Stand: Min assist;+2 safety/equipment         General transfer comment: Pt requires min assist for getting into standing, but largely can perform the majority of the stand by  using his own momentum. Once in standing pt is stable with no LOB   Ambulation/Gait Ambulation/Gait assistance: Min guard Ambulation Distance (Feet): 110 Feet (x 2 ) Assistive device:  (BRW) Gait Pattern/deviations: Step-through pattern;Decreased step length - right;Decreased step length - left;Trunk flexed Gait velocity: decreased Gait velocity interpretation: Below normal speed for age/gender General Gait Details: Pt ambulated on 3L O2 [place] and completed two sets of 17620ft ambulation. Prior to ambulation on 3L pt was at 99% O2 saturation. Pt dropped to 93% O2 during ambulation and recovered to 98% within 2 minutes of resting in sitting. Pt too fatigued after to complete a 3rd set of ambulation    Stairs            Wheelchair Mobility    Modified Rankin (Stroke Patients Only)       Balance Overall balance assessment: No apparent balance deficits (not formally assessed)                                  Cognition Arousal/Alertness: Awake/alert Behavior During Therapy: WFL for tasks assessed/performed Overall Cognitive Status: Within Functional Limits for tasks assessed                      Exercises      General Comments        Pertinent Vitals/Pain Pain Assessment: 0-10 Pain Score: 2  Pain Location: bilat LEs Pain Intervention(s): Limited activity within patient's tolerance;Monitored during session;Premedicated before session    Home Living  Prior Function            PT Goals (current goals can now be found in the care plan section) Acute Rehab PT Goals Patient Stated Goal: to walk PT Goal Formulation: With patient Time For Goal Achievement: 07/26/15 Potential to Achieve Goals: Good Progress towards PT goals: Progressing toward goals    Frequency  Min 2X/week    PT Plan Current plan remains appropriate    Co-evaluation             End of Session Equipment Utilized During Treatment: Gait  belt;Oxygen Activity Tolerance: Patient tolerated treatment well Patient left: in chair;with call bell/phone within reach;with chair alarm set     Time: 1110-1134 PT Time Calculation (min) (ACUTE ONLY): 24 min  Charges:                       G CodesBenna Dunks 08/06/2015, 1:10 PM Benna Dunks, SPT. [phone]

## 2015-07-16 NOTE — Care Management (Signed)
[facility] per MD notes. Patient will need PT RN and CW ordered as well as Tele-health monitoring. More to follow.

## 2015-07-16 NOTE — Progress Notes (Signed)
Tyler [name] is a 59 y.o. male  <principal problem not specified>   SUBJECTIVE:  Pt in NAD. Wound Care consult appreciated. Legs are now wrapped. Remains on IV ABX. No complaints.  ______________________________________________________________________  ROS: Review of systems is unremarkable for any active cardiac,respiratory, GI, GU, hematologic, neurologic or psychiatric systems, 10 systems reviewed.  @CMEDLIST @  Past Medical History  Diagnosis Date  . Cataract   . CHF (congestive heart failure) (HCC)   . COPD (chronic obstructive pulmonary disease) (HCC)   . Diabetes mellitus without complication (HCC)   . Sleep apnea   . Oxygen deficiency   . Thyroid disease   . Gout   . Hypothyroidism   . CKD (chronic kidney disease) stage 3, GFR 30-59 ml/min   . Asthma   . Hypertension     Past Surgical History  Procedure Laterality Date  . Cholecystectomy      2002  . Eye surgery      right cataract removed 3/21  . Vena cava filter placement      PHYSICAL EXAM:  BP 106/65 mmHg  Pulse 62  Temp(Src) 98.1 F (36.7 C) (Oral)  Resp 16  Ht 6' (1.829 m)  Wt 161.3 kg (355 lb 9.6 oz)  BMI 48.22 kg/m2  SpO2 96%  Wt Readings from Last 3 Encounters:  07/16/15 161.3 kg (355 lb 9.6 oz)  07/05/15 139.935 kg (308 lb 8 oz)  06/13/15 165.019 kg (363 lb 12.8 oz)            Constitutional: NAD Neck: supple, no thyromegaly Respiratory: basilar rales Cardiovascular: RRR, no murmur, no gallop Abdomen: soft, good BS, nontender Extremities: 3+ edema Neuro: alert and oriented, no focal motor or sensory deficits  ASSESSMENT/PLAN:  Labs and imaging studies were reviewed  No change in care. Did well with PT. Will continue IV ABX. Need to arrange for home health to change wraps. Possible D/C tomorrow.

## 2015-07-17 LAB — CBC WITH DIFFERENTIAL/PLATELET
BASOS ABS: 0.1 10*3/uL (ref 0–0.1)
Basophils Relative: 2 %
Eosinophils Absolute: 0.1 10*3/uL (ref 0–0.7)
Eosinophils Relative: 2 %
HEMATOCRIT: 29 % — AB (ref 40.0–52.0)
Hemoglobin: 9.2 g/dL — ABNORMAL LOW (ref 13.0–18.0)
LYMPHS ABS: 0.9 10*3/uL — AB (ref 1.0–3.6)
LYMPHS PCT: 14 %
MCH: 25 pg — AB (ref 26.0–34.0)
MCHC: 31.6 g/dL — ABNORMAL LOW (ref 32.0–36.0)
MCV: 79.1 fL — AB (ref 80.0–100.0)
Monocytes Absolute: 0.8 10*3/uL (ref 0.2–1.0)
Monocytes Relative: 12 %
NEUTROS ABS: 4.4 10*3/uL (ref 1.4–6.5)
Neutrophils Relative %: 70 %
Platelets: 250 10*3/uL (ref 150–440)
RBC: 3.66 MIL/uL — ABNORMAL LOW (ref 4.40–5.90)
RDW: 19.5 % — AB (ref 11.5–14.5)
WBC: 6.3 10*3/uL (ref 3.8–10.6)

## 2015-07-17 LAB — PROTIME-INR
INR: 3.11
Prothrombin Time: 32.1 seconds — ABNORMAL HIGH (ref 11.4–15.0)

## 2015-07-17 LAB — BASIC METABOLIC PANEL
ANION GAP: 8 (ref 5–15)
BUN: 60 mg/dL — ABNORMAL HIGH (ref 6–20)
CALCIUM: 7.9 mg/dL — AB (ref 8.9–10.3)
CO2: 27 mmol/L (ref 22–32)
Chloride: 96 mmol/L — ABNORMAL LOW (ref 101–111)
Creatinine, Ser: 1.7 mg/dL — ABNORMAL HIGH (ref 0.61–1.24)
GFR calc Af Amer: 49 mL/min — ABNORMAL LOW (ref >60)
GFR calc non Af Amer: 42 mL/min — ABNORMAL LOW (ref >60)
GLUCOSE: 135 mg/dL — AB (ref 65–99)
POTASSIUM: 3.8 mmol/L (ref 3.5–5.1)
Sodium: 131 mmol/L — ABNORMAL LOW (ref 135–145)

## 2015-07-17 LAB — GLUCOSE, CAPILLARY
GLUCOSE-CAPILLARY: 157 mg/dL — AB (ref 65–99)
GLUCOSE-CAPILLARY: 184 mg/dL — AB (ref 65–99)

## 2015-07-17 MED ORDER — CEFUROXIME AXETIL 500 MG PO TABS
250.0000 mg | ORAL_TABLET | Freq: Two times a day (BID) | ORAL | Status: DC
  Administered 2015-07-17: 250 mg via ORAL
  Filled 2015-07-17: qty 1

## 2015-07-17 MED ORDER — ASPIRIN 81 MG PO TBEC: 81.0000 mg | DELAYED_RELEASE_TABLET | Freq: Every day | ORAL | Status: AC

## 2015-07-17 MED ORDER — FUROSEMIDE 40 MG PO TABS: 40.0000 mg | ORAL_TABLET | Freq: Two times a day (BID) | ORAL | Status: AC

## 2015-07-17 MED ORDER — DOCUSATE SODIUM 100 MG PO CAPS: 100.0000 mg | ORAL_CAPSULE | Freq: Two times a day (BID) | ORAL | Status: AC

## 2015-07-17 MED ORDER — IPRATROPIUM-ALBUTEROL 0.5-2.5 (3) MG/3ML IN SOLN
3.0000 mL | Freq: Four times a day (QID) | RESPIRATORY_TRACT | Status: DC
  Administered 2015-07-17 (×2): 3 mL via RESPIRATORY_TRACT
  Filled 2015-07-17 (×3): qty 3

## 2015-07-17 MED ORDER — ALBUTEROL SULFATE (2.5 MG/3ML) 0.083% IN NEBU: 2.5000 mg | INHALATION_SOLUTION | RESPIRATORY_TRACT | Status: AC | PRN

## 2015-07-17 MED ORDER — FUROSEMIDE 40 MG PO TABS: 40.0000 mg | ORAL_TABLET | Freq: Two times a day (BID) | ORAL | Status: DC

## 2015-07-17 MED ORDER — IPRATROPIUM-ALBUTEROL 0.5-2.5 (3) MG/3ML IN SOLN: 3.0000 mL | Freq: Four times a day (QID) | RESPIRATORY_TRACT | Status: DC

## 2015-07-17 MED ORDER — CEFUROXIME AXETIL 250 MG PO TABS: 250.0000 mg | ORAL_TABLET | Freq: Two times a day (BID) | ORAL | Status: AC

## 2015-07-17 MED ORDER — FUROSEMIDE 40 MG PO TABS
40.0000 mg | ORAL_TABLET | Freq: Two times a day (BID) | ORAL | Status: DC
  Administered 2015-07-17: 40 mg via ORAL
  Filled 2015-07-17: qty 1

## 2015-07-17 NOTE — Care Management (Signed)
Spoke with patient again concerning discharge today. Alert oriented and pleasant no concerns voiced.[facility]Mary [facility] discuss and inform of discharge today. PT RN Wound care and Tele health. No other needs.

## 2015-07-17 NOTE — Progress Notes (Signed)
Pt A and O x 4. VSS. Pt tolerating diet well. Minimal complaints of pain with no meds requested. IV removed intact, prescriptions given. Pt voiced understanding of discharge instructions with no further questions. Pt discharged via wheelchair with nurse aide.

## 2015-07-17 NOTE — Evaluation (Signed)
Physical Therapy Evaluation Patient Details Name: Tyler [name] MRN: [id number] DOB: 17-Dec-1955 Today's Date: [name]   History of Present Illness  Pt is a 59 yo male who was admitted to the hospital for bilateral LE cellulitis   Clinical Impression  Pt making progress towards goals this morning with further ambulation and much better O2 saturation response. He continues to need assist with getting into standing (which is baseline), but his ambulation speed and confidence has improved quite a bit. He continues to be motivated to participate in therapy tasks. Pt still expressing wishes to participate in [facility] once d/c'd from hospital. Due to gait and endurance deficits pt will continue to benefit from skilled PT to return to PLOF.    Follow Up Recommendations Home health PT;Other (comment) (Pulmonary rehabilitation ([facility]))    Equipment Recommendations  None recommended by PT    Recommendations for Other Services       Precautions / Restrictions Precautions Precautions: Fall Restrictions Weight Bearing Restrictions: No      Mobility  Bed Mobility               General bed mobility comments: Pt up in recliner; not assessed   Transfers Overall transfer level: Needs assistance Equipment used:  (BRW) Transfers: Sit to/from Stand Sit to Stand: Min assist;+2 safety/equipment         General transfer comment: Pt requires min assist for getting into standing, but largely can perform the majority of the stand by using his own momentum. Once in standing pt is stable with no LOB   Ambulation/Gait Ambulation/Gait assistance: Min guard Ambulation Distance (Feet): 140 Feet (x2) Assistive device:  (BRW) Gait Pattern/deviations: Step-through pattern;Decreased step length - right;Decreased step length - left Gait velocity: decreased Gait velocity interpretation: Below normal speed for age/gender General Gait Details: Pt able to ambulate greater distance x 2  reps this afternoon. Ambulation occured on 2L O2 [place] as compared to 3L yesterday. During or after ambulation pt's O2 saturation never fell below 95%  Stairs            Wheelchair Mobility    Modified Rankin (Stroke Patients Only)       Balance Overall balance assessment: No apparent balance deficits (not formally assessed)                                           Pertinent Vitals/Pain Pain Assessment: No/denies pain Pain Score: 2     Home Living                        Prior Function                 Hand Dominance        Extremity/Trunk Assessment                         Communication      Cognition Arousal/Alertness: Awake/alert Behavior During Therapy: WFL for tasks assessed/performed Overall Cognitive Status: Within Functional Limits for tasks assessed                      General Comments      Exercises        Assessment/Plan    PT Assessment    PT Diagnosis     PT Problem List  PT Treatment Interventions     PT Goals (Current goals can be found in the Care Plan section) Acute Rehab PT Goals Patient Stated Goal: to go home today PT Goal Formulation: With patient Time For Goal Achievement: 07/26/15 Potential to Achieve Goals: Good    Frequency Min 2X/week   Barriers to discharge        Co-evaluation               End of Session Equipment Utilized During Treatment: Gait belt;Oxygen Activity Tolerance: Patient tolerated treatment well Patient left: in chair;with call bell/phone within reach;with chair alarm set Nurse Communication: Mobility status         Time: [id number] PT Time Calculation (min) (ACUTE ONLY): 25 min   Charges:         PT G CodesBenna [name]:        Tyler [name], 12:27 PM  Tyler [name], SPT. [phone]

## 2015-07-17 NOTE — Consult Note (Signed)
WOC wound follow up Wound type: Resolving cellulitis.  Application of Unnas Boots compression today.  [facility]  Measurement: Intact 4 cm x 6 cm x +0.8 cm blood filled blister still present and intact.  Blood is absorbing and wound is resolving.  Encouraged patient not to disrupt the blister and make open portal for infection.  To allow to heal from the inside out.  Verbalized understanding. WIll cover with alginate sheet for absorption and protection.   Wound bed: Nonintact scabbed lesion remains to dorsal foot at the bend of the leg.  Erythema is resolving.  Patient states legs are feeling better.  Patient sitting dependent again today.  Encouraged to elevate legs as much as possible.  Edema (generalized and nonpitting) present to bilateral feet.  States he is working on getting a life chair at home and that will help with leg elevation.  His 2 recliners are broken.  [facility] is helping with this, he reports.  Drainage (amount, consistency, odor) None noted Periwound:Erythema resolving.  Generalized edema to legs and feet.  Dressing procedure/placement/frequency:LEgs cleansed with soap and water.  Alginate to blood filled blister on right medial heel.  BIlateral zinc layer and secured with Coban.  [facility].  Will not follow at this time.  Please re-consult if needed.  Maple HudsonKaren Marqui Formby RN BSN CWON Pager [phone]

## 2015-07-17 NOTE — Discharge Summary (Signed)
Tyler [name], is a 59 y.o. male  DOB 1956-01-05  MRN [id number].  Admission date:  07/11/2015  Admitting Physician  Milagros Loll, MD  Discharge Date:  07/17/2015   Primary MD  Crista Nuon D, MD  Recommendations for primary care physician for things to follow:   Pt with morbid obesity and chronic lymphedema admitted with LLE cellulitis. Placed on IV ABX and seen by Wound Care team. Legs were wrapped. Pt did well. Deferred SNF placement. Changed to po ABX and now d/c'd home with home health.   Admission Diagnosis  Elevated INR [R79.1] Left leg cellulitis [L03.116] Hemoptysis [R04.2]   Discharge Diagnosis  Elevated INR [R79.1] Left leg cellulitis [L03.116] Hemoptysis [R04.2]    Active Problems:   Left leg cellulitis   Morbid obesity (HCC)      Past Medical History  Diagnosis Date  . Cataract   . CHF (congestive heart failure) (HCC)   . COPD (chronic obstructive pulmonary disease) (HCC)   . Diabetes mellitus without complication (HCC)   . Sleep apnea   . Oxygen deficiency   . Thyroid disease   . Gout   . Hypothyroidism   . CKD (chronic kidney disease) stage 3, GFR 30-59 ml/min   . Asthma   . Hypertension     Past Surgical History  Procedure Laterality Date  . Cholecystectomy      2002  . Eye surgery      right cataract removed 3/21  . Vena cava filter placement         History of present illness and  Hospital Course:     Kindly see H&P for history of present illness and admission details, please review complete Labs, Consult reports and Test reports for all details in brief  HPI  from the history and physical done on the day of admission    Hospital Course    See above   Discharge Condition: stable   Follow UP  Follow-up Information    Follow up with Wallis Spizzirri D, MD In 1 week.   Specialty:  Internal Medicine   Contact information:   [address] [facility] [place] [address] [phone]         Discharge Instructions  and  Discharge Medications   Home Health     Medication List    STOP taking these medications        warfarin 10 MG tablet  Commonly known as:  COUMADIN      TAKE these medications        acetaminophen 500 MG tablet  Commonly known as:  TYLENOL  Take 1,000 mg by mouth every 6 (six) hours as needed for mild pain or headache.     albuterol (2.5 MG/3ML) 0.083% nebulizer solution  Commonly known as:  PROVENTIL  Take 3 mLs (2.5 mg total) by nebulization every 2 (two) hours as needed for wheezing.     alum & mag hydroxide-simeth 200-200-20 MG/5ML suspension  Commonly known as:  MAALOX/MYLANTA  Take 30 mLs  by mouth every 4 (four) hours as needed for indigestion or heartburn.     ammonium lactate 12 % lotion  Commonly known as:  LAC-HYDRIN  Apply topically as needed for dry skin.     aspirin 81 MG EC tablet  Take 1 tablet (81 mg total) by mouth daily.     cefUROXime 250 MG tablet  Commonly known as:  CEFTIN  Take 1 tablet (250 mg total) by mouth 2 (two) times daily with a meal.     colchicine 0.6 MG tablet  Take 0.6 mg by mouth as needed (for gout flares).     cyanocobalamin 2000 MCG tablet  Take 2,000 mcg by mouth daily.     cyanocobalamin 1000 MCG/ML injection  Commonly known as:  (VITAMIN B-12)  Inject 1,000 mcg into the muscle every 30 (thirty) days.     docusate sodium 100 MG capsule  Commonly known as:  COLACE  Take 1 capsule (100 mg total) by mouth 2 (two) times daily.     fluticasone 50 MCG/ACT nasal spray  Commonly known as:  FLONASE  Place 2 sprays into both nostrils daily as needed for rhinitis.     furosemide 40 MG tablet  Commonly known as:  LASIX  Take 1 tablet (40 mg total) by mouth 2 (two) times daily.     gabapentin 100 MG capsule  Commonly known as:  NEURONTIN  Take 100 mg by mouth 2 (two) times daily.     hydrOXYzine 25 MG tablet   Commonly known as:  ATARAX/VISTARIL  Take 25-50 mg by mouth 3 (three) times daily as needed for itching.     insulin aspart 100 UNIT/ML injection  Commonly known as:  novoLOG  Inject 0-9 Units into the skin 3 (three) times daily with meals.     insulin NPH Human 100 UNIT/ML injection  Commonly known as:  HUMULIN N,NOVOLIN N  Inject into the skin as needed (for high blood sugar). Pt states that he uses per sliding scale.     insulin regular 100 units/mL injection  Commonly known as:  NOVOLIN R,HUMULIN R  Inject into the skin as needed for high blood sugar. Pt states that he uses per sliding scale.     ipratropium-albuterol 0.5-2.5 (3) MG/3ML Soln  Commonly known as:  DUONEB  Take 3 mLs by nebulization 4 (four) times daily.     lactulose 10 GM/15ML solution  Commonly known as:  CHRONULAC  Take 20 g by mouth 2 (two) times daily as needed for mild constipation.     levothyroxine 100 MCG tablet  Commonly known as:  SYNTHROID, LEVOTHROID  Take 100 mcg by mouth daily.     ondansetron 4 MG tablet  Commonly known as:  ZOFRAN  Take 1 tablet (4 mg total) by mouth every 6 (six) hours as needed for nausea.     oxyCODONE-acetaminophen 5-325 MG tablet  Commonly known as:  PERCOCET/ROXICET  Take 2 tablets by mouth every 6 (six) hours as needed for moderate pain.     PARoxetine 20 MG tablet  Commonly known as:  PAXIL  Take 20 mg by mouth daily.     polyethylene glycol packet  Commonly known as:  MIRALAX / GLYCOLAX  Take 17 g by mouth daily as needed for mild constipation.     pravastatin 40 MG tablet  Commonly known as:  PRAVACHOL  Take 40 mg by mouth at bedtime.     salmeterol 50 MCG/DOSE diskus inhaler  Commonly known as:  SEREVENT  Inhale 1 puff into the lungs 2 (two) times daily as needed (for shortness of breath).     spironolactone 25 MG tablet  Commonly known as:  ALDACTONE  Take 25 mg by mouth 2 (two) times daily.     tamsulosin 0.4 MG Caps capsule  Commonly known as:   FLOMAX  Take 1 capsule (0.4 mg total) by mouth daily.     torsemide 100 MG tablet  Commonly known as:  DEMADEX  Take 50 mg by mouth daily.          Diet and Activity recommendation: See Discharge Instructions above   Consults obtained - ID, Wound Care   Major procedures and Radiology Reports - PLEASE review detailed and final reports for all details, in brief -   See below   US Renal  07/02/2015  CLINICAL DATA:  Acute renal failure EXAM: RENAL / URINARY TRACT ULTRASOUND COMPLETE COMPARISON:  None. FINDINGS: Right Kidney: Length: 15.4 cm.  No mass or hydronephrosis. Left Kidney: Length: 13.7 cm.  No mass or hydronephrosis. Bladder: Underdistended/not visualized. Additional comments: Abdominal ascites. IMPRESSION: No hydronephrosis. Bladder is not discretely visualized/underdistended. Abdominal ascites. Electronically Signed   By: Charline Bills M.D.   On: 07/02/2015 18:13   US Venous Img Lower Unilateral Left  07/11/2015  CLINICAL DATA:  Left leg swelling and pain for 3 weeks. EXAM: LEFT LOWER EXTREMITY VENOUS DOPPLER ULTRASOUND TECHNIQUE: Gray-scale sonography with graded compression, as well as color Doppler and duplex ultrasound were performed to evaluate the lower extremity deep venous systems from the level of the common femoral vein and including the common femoral, femoral, profunda femoral, popliteal and calf veins including the posterior tibial, peroneal and gastrocnemius veins when visible. The superficial great saphenous vein was also interrogated. Spectral Doppler was utilized to evaluate flow at rest and with distal augmentation maneuvers in the common femoral, femoral and popliteal veins. COMPARISON:  07/02/2015 FINDINGS: Contralateral Common Femoral Vein: Respiratory phasicity is normal and symmetric with the symptomatic side. No evidence of thrombus. Normal compressibility. Common Femoral Vein: No evidence of thrombus. Normal compressibility, respiratory phasicity and  response to augmentation. Saphenofemoral Junction: No evidence of thrombus. Normal compressibility and flow on color Doppler imaging. Profunda Femoral Vein: No evidence of thrombus. Normal compressibility and flow on color Doppler imaging. Femoral Vein: No evidence of thrombus. Normal compressibility, respiratory phasicity and response to augmentation. Popliteal Vein: No evidence of thrombus. Normal compressibility, respiratory phasicity and response to augmentation. Calf Veins: Not well visualized due to diffuse soft tissue edema. Superficial Great Saphenous Vein: No evidence of thrombus. Normal compressibility and flow on color Doppler imaging. Venous Reflux:  None. Other Findings:  Diffuse lower extremity soft tissue edema noted. IMPRESSION: Nonvisualization of calf veins due to soft tissue edema. No acute deep venous thrombosis identified. Electronically Signed   By: Myles Rosenthal M.D.   On: 07/11/2015 14:17   US Venous Img Lower Unilateral Left  07/02/2015  CLINICAL DATA:  Left lower extremity pain and swelling for the past 4-5 days. History of smoking. History of prior DVT and IVC filter placement. Evaluate for acute or chronic DVT. EXAM: LEFT LOWER EXTREMITY VENOUS DOPPLER ULTRASOUND TECHNIQUE: Gray-scale sonography with graded compression, as well as color Doppler and duplex ultrasound were performed to evaluate the lower extremity deep venous systems from the level of the common femoral vein and including the common femoral, femoral, profunda femoral, popliteal and calf veins including the posterior tibial, peroneal and gastrocnemius veins when visible. The superficial great  saphenous vein was also interrogated. Spectral Doppler was utilized to evaluate flow at rest and with distal augmentation maneuvers in the common femoral, femoral and popliteal veins. COMPARISON:  Bilateral lower extremity venous Doppler ultrasound - 10/30/2013 FINDINGS: Contralateral Common Femoral Vein: Respiratory phasicity is  normal and symmetric with the symptomatic side. No evidence of thrombus. Normal compressibility. Common Femoral Vein: No evidence of thrombus. Normal compressibility, respiratory phasicity and response to augmentation. Saphenofemoral Junction: No evidence of thrombus. Normal compressibility and flow on color Doppler imaging. Profunda Femoral Vein: No evidence of thrombus. Normal compressibility and flow on color Doppler imaging. Femoral Vein: No evidence of thrombus. Normal compressibility, respiratory phasicity and response to augmentation. Popliteal Vein: No evidence of thrombus. Normal compressibility, respiratory phasicity and response to augmentation. Calf Veins: No evidence of thrombus. Normal compressibility and flow on color Doppler imaging. Superficial Great Saphenous Vein: No evidence of thrombus. Normal compressibility and flow on color Doppler imaging. Venous Reflux:  None. Other Findings: Note is made of a benign appearing right inguinal lymph node which measures approximately 1 cm in greatest short axis diameter and maintains a benign fatty hila. Nonspecific prominent vascularity is noted about the hila of this lymph node. IMPRESSION: No evidence of acute or chronic DVT within the left lower extremity. Electronically Signed   By: Simonne ComeJohn  Watts M.D.   On: 07/02/2015 12:30   Dg Chest Portable 1 View  07/11/2015  CLINICAL DATA:  Pt to ed with c/o coughing up blood today. Pt recently d/c from hospital, on blood thinners at home. Pt also c/o pain and swelling to left lower leg. Left leg red, with increased swelling and warm to touch. Pt also states swelling into groin area. EXAM: PORTABLE CHEST 1 VIEW COMPARISON:  05/27/2015 FINDINGS: Patient rotated left. Cardiomegaly accentuated by AP portable technique. New or increased left pleural effusion. No pneumothorax. Interstitial prominence and indistinctness is moderate, progressive. Patchy left lower and right upper lobe airspace opacities. IMPRESSION:  Moderate congestive heart failure. Left pleural effusion, new or enlarged. Adjacent airspace disease could represent atelectasis or infection. There is also right upper lobe airspace disease, favoring pulmonary edema. Electronically Signed   By: Jeronimo GreavesKyle  Talbot M.D.   On: 07/11/2015 12:43    Micro Results   See below  No results found for this or any previous visit (from the past 240 hour(s)).     Today   Subjective:   Tyler PittMichael [name] today has no headache,no chest abdominal pain,no new weakness tingling or numbness, feels much better wants to go home today.   Objective:   Blood pressure 90/63, pulse 72, temperature 97.4 F (36.3 C), temperature source Oral, resp. rate 20, height 6' (1.829 m), weight 160.3 kg (353 lb 6.4 oz), SpO2 94 %.   Intake/Output Summary (Last 24 hours) at 07/17/15 0734 Last data filed at 07/17/15 0538  Gross per 24 hour  Intake    940 ml  Output    835 ml  Net    105 ml    Exam Awake Alert, Oriented x 3, No new F.N deficits, Normal affect [place].AT,PERRAL Supple Neck,No JVD, No cervical lymphadenopathy appriciated.  Symmetrical Chest wall movement, Good air movement bilaterally, basilar rales RRR,No Gallops,Rubs or new Murmurs, No Parasternal Heave +ve B.Sounds, Abd Soft, Non tender, No organomegaly appriciated, No rebound -guarding or rigidity. No Cyanosis, Clubbing. 3+ edema, No new Rash or bruise  Data Review   CBC w Diff: Lab Results  Component Value Date   WBC 6.3 07/17/2015   WBC  5.8 10/29/2014   HGB 9.2* 07/17/2015   HGB 12.9* 10/29/2014   HCT 29.0* 07/17/2015   HCT 40.0 10/29/2014   PLT 250 07/17/2015   PLT 221 10/29/2014   LYMPHOPCT 14 07/17/2015   LYMPHOPCT 19.5 10/29/2014   MONOPCT 12 07/17/2015   MONOPCT 8.5 10/29/2014   EOSPCT 2 07/17/2015   EOSPCT 2.2 10/29/2014   BASOPCT 2 07/17/2015   BASOPCT 1.1 10/29/2014    CMP: Lab Results  Component Value Date   NA 131* 07/17/2015   NA 136 08/21/2014   K 3.8 07/17/2015   K 3.7  08/21/2014   CL 96* 07/17/2015   CL 98 08/21/2014   CO2 27 07/17/2015   CO2 30 08/21/2014   BUN 60* 07/17/2015   BUN 25* 08/21/2014   CREATININE 1.70* 07/17/2015   CREATININE 1.73* 08/21/2014   PROT 6.5 07/03/2015   PROT 7.2 08/21/2014   ALBUMIN 2.1* 07/03/2015   ALBUMIN 2.8* 08/21/2014   BILITOT 1.1 07/03/2015   BILITOT 1.2* 08/21/2014   ALKPHOS 307* 07/03/2015   ALKPHOS 330* 08/21/2014   AST 21 07/03/2015   AST 25 08/21/2014   ALT 10* 07/03/2015   ALT 13* 08/21/2014  .   Total Time in preparing paper work, data evaluation and todays exam - 45 minutes  Satrina Magallanes D M.D on 07/17/2015 at 7:34 AM

## 2015-07-17 NOTE — Care Management Important Message (Signed)
Important Message  Patient Details  Name: Tyler [name] MRN: [id number] Date of Birth: Nov 30, 1955   Medicare Important Message Given:  Yes-third notification given    Adonis HugueninBerkhead, Telisa Ohlsen L, RN 07/17/2015, 12:06 PM

## 2015-07-17 NOTE — Progress Notes (Signed)
   07/17/15 0924  Clinical Encounter Type  Visited With Patient  Visit Type Follow-up  Consult/Referral To Chaplain  Chaplain followed up with patient and was informed that he would be dc today. Patient appeared to be in better spirits. He was pleased with dc instructions and the help he [facility]. Chaplain did advise him to contact social services and to speak with his home health agency about additional help he needed with handicapping his house.   Chaplain Beronica Lansdale [phone]

## 2015-07-17 NOTE — Discharge Instructions (Signed)
Home Health

## 2015-07-18 ENCOUNTER — Other Ambulatory Visit: Payer: Self-pay | Admitting: *Deleted

## 2015-07-18 NOTE — Patient Outreach (Signed)
Transition of care call (week 1, discharged 10/26):  Spoke with spouse who reports was in the hospital for cellulitis in right leg.  Pt states Home health will be out tomorrow to wrap leg (unaboot).  Pt states MD called in pain medication to pharmacy, spouse to pick it up.  Pt handed phone to spouse as requested by RN CM to review discharge medications.  Spouse states pt took off the unaboot yesterday because of the pain and she wrapped it with gauze to protect it from infection, [facility]- apply unaboot.  Pt relayed to spouse as we were talking - received a call that  [facility] is coming out tomorrow.  RN CM informed spouse [facility] is suppose to provide tele health- arranged by hospital RN CM.  With spouse wanted to get off the phone to pick up pt's pain medication, RN CM discussed doing a home visit next week to which spouse/pt agreed.    Plan to f/u with pt again 10/31- home visit.    Shayne Alkenose M.   Pierzchala RN CCM [facility] Care Management  [phone]

## 2015-07-19 NOTE — Care Management (Addendum)
Post discharge: [facility] that Dr. Judithann SheenSparks had notified her that home health (per patient) never followed up with patient. I [facility] [phone]. Shawna OrleansMelanie said that they are going to his house today. She said she never received order for telehealth- she said "it is not typically something that the doctor orders" she states "she makes that determination and he meets criteria for telehealth". He has telemonitor ordered but not applied- per melanie. He is also [facility] wound [facility] but Shawna OrleansMelanie said there was no specific wound care orders received. Per Shawna OrleansMelanie she was not notified of patient discharge on 07/17/15. Per [facility] RN karen on 07/02/15 order states "Dressing procedure/placement/frequency:Cleanse ulcer to left right dorsal foot with NS and pat gently dry. Will treat conservatively at this time with NS moist gauze each shift. Cover with 4x4 gauze and kerlix/tape. WIll encourage compression once DVT is ruled out and patient feels better. Amlactin cream to bilateral lower legs daily.  Will not follow at this time". I have forwarded this note to Dr. Judithann SheenSparks for wound care [facility] [phone] and [facility]. I have notified patient at [phone]--he said [facility].   07/19/15 1330: Received call from Dr Judithann SheenSparks; he acknowledged wound care need. Wound Care orders where in EPIC on 07/15/15 which was [facility]  I have faxed the following wound [facility] ([facility] "Cleanse bilateral lower legs with soap and water and pat gently dry. APply silver hydrofiber (Aquacel AG) to right heel injury. ZInc layer from base of toes to just below knee. Secure with COban. (2 rolls per leg). Change Mon/Wed/Fri".  Patient [facility] Friday [facility]- patient discharged on Wednesday 07/17/15.   I spoke with Tamala BariMary Manley [facility] at 1355 on  07/19/15 for clarification- she did received wound care instructions and she did make [facility] aware.

## 2015-07-22 ENCOUNTER — Other Ambulatory Visit: Payer: Self-pay | Admitting: *Deleted

## 2015-07-23 ENCOUNTER — Encounter: Payer: Self-pay | Admitting: *Deleted

## 2015-07-23 NOTE — Patient Outreach (Signed)
[place] [facility]) Care Management   Home visit 07/22/15  Tyler [name] Sep 17, 1956 [id number]  Tyler [name] is an 59 y.o. male  Subjective:  Pt reports HH RN came twice already to do dressing (unaboot) on both legs.  Spouse states pt is to f/u (post hospital visit) with Dr. [name] 11/2.  Pt states lip bleeding from razor.   Pt reports missed his sleep study, was in the hospital.  Spouse states pt is getting a lift chair, to be delivered to the home (11/8).  Spouse states pt is unable to weigh due to being unable to stand.  Spouse reports pt's blood sugar today was 101,yesterday 272.      Objective:   Filed Vitals:   07/22/15 1559  BP: 104/62  Pulse: 64  Resp: 24    ROS  Physical Exam  Constitutional: He is oriented to person, place, and time. He appears well-developed and well-nourished.  Cardiovascular: Regular rhythm.   Respiratory: Breath sounds normal.  On  O 2 2.5L  [place]   GI: He exhibits distension.  Musculoskeletal: He exhibits edema.  3-4 + edema in bilateral lower extremities, top of feet   Neurological: He is alert and oriented to person, place, and time.  Skin:  Some  redness noted above unaboot dressing on LLE   Psychiatric: He has a normal mood and affect. His behavior is normal. Judgment and thought content normal.    Current Medications:  Reviewed with spouse  Current Outpatient Prescriptions  Medication Sig Dispense Refill  . acetaminophen (TYLENOL) 500 MG tablet Take 1,000 mg by mouth every 6 (six) hours as needed for mild pain or headache.    . albuterol (PROVENTIL) (2.5 MG/3ML) 0.083% nebulizer solution Take 3 mLs (2.5 mg total) by nebulization every 2 (two) hours as needed for wheezing. 75 mL 12  . ammonium lactate (LAC-HYDRIN) 12 % lotion Apply topically as needed for dry skin. 400 g 0  . aspirin EC 81 MG EC tablet Take 1 tablet (81 mg total) by mouth daily. 100 tablet 0  . cefUROXime (CEFTIN) 250 MG tablet Take 1 tablet (250 mg  total) by mouth 2 (two) times daily with a meal. 20 tablet 0  . colchicine 0.6 MG tablet Take 0.6 mg by mouth as needed (for gout flares).     . cyanocobalamin (,VITAMIN B-12,) 1000 MCG/ML injection Inject 1,000 mcg into the muscle every 30 (thirty) days.    . cyanocobalamin 2000 MCG tablet Take 2,000 mcg by mouth daily.    Marland Kitchen docusate sodium (COLACE) 100 MG capsule Take 1 capsule (100 mg total) by mouth 2 (two) times daily. 10 capsule 0  . fluticasone (FLONASE) 50 MCG/ACT nasal spray Place 2 sprays into both nostrils daily as needed for rhinitis.    . furosemide (LASIX) 40 MG tablet Take 1 tablet (40 mg total) by mouth 2 (two) times daily. 60 tablet 5  . gabapentin (NEURONTIN) 100 MG capsule Take 100 mg by mouth 2 (two) times daily.    . insulin aspart (NOVOLOG) 100 UNIT/ML injection Inject 0-9 Units into the skin 3 (three) times daily with meals. 10 mL 11  . insulin NPH Human (HUMULIN N,NOVOLIN N) 100 UNIT/ML injection Inject into the skin as needed (for high blood sugar). Pt states that he uses per sliding scale.    . insulin regular (NOVOLIN R,HUMULIN R) 100 units/mL injection Inject into the skin as needed for high blood sugar. Pt states that he uses per sliding scale.    Marland Kitchen  ipratropium-albuterol (DUONEB) 0.5-2.5 (3) MG/3ML SOLN Take 3 mLs by nebulization 4 (four) times daily. 360 mL 5  . PARoxetine (PAXIL) 20 MG tablet Take 20 mg by mouth daily.    . polyethylene glycol (MIRALAX / GLYCOLAX) packet Take 17 g by mouth daily as needed for mild constipation.     . pravastatin (PRAVACHOL) 40 MG tablet Take 40 mg by mouth at bedtime.    . salmeterol (SEREVENT) 50 MCG/DOSE diskus inhaler Inhale 1 puff into the lungs 2 (two) times daily as needed (for shortness of breath).    Marland Kitchen spironolactone (ALDACTONE) 25 MG tablet Take 25 mg by mouth 2 (two) times daily.    . tamsulosin (FLOMAX) 0.4 MG CAPS capsule Take 1 capsule (0.4 mg total) by mouth daily. 30 capsule 5  . torsemide (DEMADEX) 100 MG tablet Take  50 mg by mouth daily.    Marland Kitchen alum & mag hydroxide-simeth (MAALOX/MYLANTA) 200-200-20 MG/5ML suspension Take 30 mLs by mouth every 4 (four) hours as needed for indigestion or heartburn. (Patient not taking: Reported on 07/22/2015) 355 mL 0  . hydrOXYzine (ATARAX/VISTARIL) 25 MG tablet Take 25-50 mg by mouth 3 (three) times daily as needed for itching.    . lactulose (CHRONULAC) 10 GM/15ML solution Take 20 g by mouth 2 (two) times daily as needed for mild constipation.     Marland Kitchen levothyroxine (SYNTHROID, LEVOTHROID) 100 MCG tablet Take 100 mcg by mouth daily.    . ondansetron (ZOFRAN) 4 MG tablet Take 1 tablet (4 mg total) by mouth every 6 (six) hours as needed for nausea. (Patient not taking: Reported on 07/22/2015) 20 tablet 0  . oxyCODONE-acetaminophen (PERCOCET/ROXICET) 5-325 MG tablet Take 2 tablets by mouth every 6 (six) hours as needed for moderate pain. (Patient not taking: Reported on 07/18/2015) 30 tablet 0   No current facility-administered medications for this visit.      Fall/Depression Screening:    PHQ 2/9 Scores 01/22/2015 12/21/2014  PHQ - 2 Score - 1  Exception Documentation (No Data) -    Assessment:  Skin integrity bilateral lower legs- unaboots intact to bilateral lower extremities, 3-4 edema noted in lower legs/top of feet.   Pt unable to elevate legs, spouse reports lift chair was ordered, to be delivered 11/8.                           HF- pt unable to weigh, unable to stand on feet.   No c/o sob, chest pain.  Abdomen hard, distended to which pt states is normal.                           DM:   Spouse reports on pt's blood sugars- 101 today, yesterday 272, 10/28- 165, 10/27- 106  Plan:  Pt to continue to take antibiotic as directed, [place] RN to change unaboot dressings as ordered,monitor changes            Pt to f/u with Dr. [name] 11/8 - post hospital visit             Edema- pt to elevate bilateral legs= lift chair to be delivered 11/8.             HF- pt to start back weighing  when able to stand safely.            DM- pt to continue to check blood sugars, record.  RN CM to send Dr. [name] quarterly update by sending 10/31 encounter, letter by fax in [place].              RN CM to continue to follow pt for transition of care- next f/u with be  telephonically on 11/7.   [facility] CM Care Plan Problem One        Most Recent Value   Care Plan Problem One  Risk for readmission related to recent hospitalization for cellulitis (2 admissions this month and one ED visit )   Role Documenting the Problem One  Care Management [place] for Problem One  Active   [facility]N Long Term Goal (31-90 days)  Pt would not readmit within 31 days of day of discharge    [place] Term Goal Start Date  07/18/15   Interventions for Problem One Long Term Goal  RN CM to f/u with weekly phone calls 31 days post discharge, home visit scheduled for next week,   [facility] CM Short Term Goal #1 (0-30 days)  Pain in right leg would be managed with pain med withn the next 7 days    [facility] CM Short Term Goal #1 Start Date  07/18/15   [facility] CM Short Term Goal #1 Met Date  -- [met- no c/o pain today, not able to take pain med]   Interventions for Short Term Goal #1  Spouse to pick up pain med called in by MD to pharmacy, RN CM discussed taking as needed.    [facility] CM Short Term Goal #2 (0-30 days)  Pt would see improvement in left leg cellulitis within next 7 days    [facility] CM Short Term Goal #2 Start Date  07/18/15   [facility] CM Short Term Goal #2 Met Date  -- [met- pt seeing some improvement ]   Interventions for Short Term Goal #2  Discussed with spouse pt's compliance with antibiiotic, keeping wound free of infection (currently unaboot off, spouse wrapped with gauze.    [facility] CM Short Term Goal #3 (0-30 days)  Impaired skin integrity- pt to continue to see improvement in left lower leg within next 14 days    [facility] CM Short Term Goal #3 Start Date  07/22/15   Interventions for Short Tern Goal #3  Discussed with pt once  obtain lift chair, to elevate legs as much as possible- promote healing.      Zara Chess.   [place] Care Management  [phone]

## 2015-07-29 ENCOUNTER — Other Ambulatory Visit: Payer: Self-pay | Admitting: *Deleted

## 2015-07-29 ENCOUNTER — Ambulatory Visit: Payer: Self-pay | Admitting: *Deleted

## 2015-07-29 NOTE — Patient Outreach (Signed)
Attempt made to contact pt as part of ongoing transition of care (week 3)- scheduled transition of care call.   Pt's phone kept ringing, unable to leave a voice message.  Will try again tomorrow.    Plan to call pt again tomorrow as part of ongoing transition of care.    Shayne Alkenose M.   Alfredo Spong RN CCM [facility]  [phone]

## 2015-07-30 ENCOUNTER — Ambulatory Visit: Payer: Self-pay | Admitting: *Deleted

## 2015-07-31 ENCOUNTER — Other Ambulatory Visit: Payer: Self-pay | Admitting: *Deleted

## 2015-07-31 NOTE — Patient Outreach (Signed)
11/08 Transition of care call (week 3):  Spoke with Jasmine DecemberSharon (pt's spouse, [facility]), reports pt f/u with Dr. Judithann SheenSparks, said everything was good.  Spouse reports pt still has some swelling in legs, still doing the unaboot.  [facility]).  Spouse states pt has his new (lift) chair, helps swelling and pain in legs.  Spouse reports HH PT had pt out.    RN CM inquired about pt's weight to which spouse said unable to view.   RN CM discussed with spouse f/u again telephonically 11/15 (part of ongoing transition of care).     Plan to call pt/spouse again 11/15.      Shayne Alkenose M.   [place] RN CCM [facility]  [phone]

## 2015-08-02 ENCOUNTER — Encounter: Payer: Commercial Managed Care - HMO | Attending: Surgery | Admitting: Surgery

## 2015-08-02 DIAGNOSIS — E1142 Type 2 diabetes mellitus with diabetic polyneuropathy: Secondary | ICD-10-CM | POA: Diagnosis not present

## 2015-08-02 DIAGNOSIS — Z7901 Long term (current) use of anticoagulants: Secondary | ICD-10-CM | POA: Diagnosis not present

## 2015-08-02 DIAGNOSIS — F17209 Nicotine dependence, unspecified, with unspecified nicotine-induced disorders: Secondary | ICD-10-CM | POA: Diagnosis not present

## 2015-08-02 DIAGNOSIS — I5032 Chronic diastolic (congestive) heart failure: Secondary | ICD-10-CM | POA: Insufficient documentation

## 2015-08-02 DIAGNOSIS — G4730 Sleep apnea, unspecified: Secondary | ICD-10-CM | POA: Insufficient documentation

## 2015-08-02 DIAGNOSIS — I739 Peripheral vascular disease, unspecified: Secondary | ICD-10-CM | POA: Insufficient documentation

## 2015-08-02 DIAGNOSIS — I890 Lymphedema, not elsewhere classified: Secondary | ICD-10-CM | POA: Diagnosis not present

## 2015-08-02 DIAGNOSIS — E11622 Type 2 diabetes mellitus with other skin ulcer: Secondary | ICD-10-CM | POA: Diagnosis present

## 2015-08-02 DIAGNOSIS — E079 Disorder of thyroid, unspecified: Secondary | ICD-10-CM | POA: Insufficient documentation

## 2015-08-02 DIAGNOSIS — J449 Chronic obstructive pulmonary disease, unspecified: Secondary | ICD-10-CM | POA: Insufficient documentation

## 2015-08-02 DIAGNOSIS — N186 End stage renal disease: Secondary | ICD-10-CM | POA: Insufficient documentation

## 2015-08-02 DIAGNOSIS — I120 Hypertensive chronic kidney disease with stage 5 chronic kidney disease or end stage renal disease: Secondary | ICD-10-CM | POA: Diagnosis not present

## 2015-08-02 DIAGNOSIS — L97512 Non-pressure chronic ulcer of other part of right foot with fat layer exposed: Secondary | ICD-10-CM | POA: Diagnosis not present

## 2015-08-02 DIAGNOSIS — M109 Gout, unspecified: Secondary | ICD-10-CM | POA: Insufficient documentation

## 2015-08-03 NOTE — Progress Notes (Signed)
Tyler [name], Tyler A. ([id number]) Visit Report for 08/02/2015 Abuse/Suicide Risk Screen Details Tyler [name], Tyler [name] 08/02/2015 3:30 Patient Name: Date of Service: A. PM Medical Record Patient Account Number: [id number] Number: Treating RN: Huel CoventryWoody, Kim Date of Birth/Sex: [birth year] (58 y.o. Male) Other Clinician: Primary Care Physician: Aram BeechamSparks, Jeffrey Treating Evlyn KannerBritto, Errol Referring Physician: Aram BeechamSparks, Jeffrey Physician/Extender: Tania AdeWeeks in Treatment: 0 Abuse/Suicide Risk Screen Items Answer ABUSE/SUICIDE RISK SCREEN: Has anyone close to you tried to hurt or harm you recentlyo No Do you feel uncomfortable with anyone in your familyo No Has anyone forced you do things that you didnot want to doo No Do you have any thoughts of harming yourselfo No Patient displays signs or symptoms of abuse and/or neglect. No Electronic Signature(s) Signed: 08/02/2015 4:39:51 PM By: Elliot GurneyWoody, RN, BSN, Kim RN, BSN Entered By: Elliot GurneyWoody, RN, BSN, Kim on 08/02/2015 15:45:24 Tyler [name], Tyler A. ([id number]) -------------------------------------------------------------------------------- Activities of Daily Living Details Tyler [name], Wesly 08/02/2015 3:30 Patient Name: Date of Service: A. PM Medical Record Patient Account Number: [id number] Number: Treating RN: Huel CoventryWoody, Kim Date of Birth/Sex: [birth year] (58 y.o. Male) Other Clinician: Primary Care Physician: Aram BeechamSparks, Jeffrey Treating Evlyn KannerBritto, Errol Referring Physician: Aram BeechamSparks, Jeffrey Physician/Extender: Tania AdeWeeks in Treatment: 0 Activities of Daily Living Items Answer Activities of Daily Living (Please select one for each item) Drive Automobile Not Able Take Medications Completely Able Use Telephone Completely Able Care for Appearance Completely Able Use Toilet Completely Able Bath / Shower Completely Able Dress Self Completely Able Feed Self Completely Able Walk Completely Able Get In / Out Bed Completely Able Housework  Completely Able Prepare Meals Completely Able Handle Money Completely Able Shop for Self Completely Able Electronic Signature(s) Signed: 08/02/2015 4:39:51 PM By: Elliot GurneyWoody, RN, BSN, Kim RN, BSN Entered By: Elliot GurneyWoody, RN, BSN, Kim on 08/02/2015 15:46:06 Tyler [name], Tyler A. ([id number]) -------------------------------------------------------------------------------- Education Assessment Details Tyler [name], Jamarii 08/02/2015 3:30 Patient Name: Date of Service: A. PM Medical Record Patient Account Number: [id number] Number: Treating RN: Huel CoventryWoody, Kim Date of Birth/Sex: [birth year] (58 y.o. Male) Other Clinician: Primary Care Physician: Aram BeechamSparks, Jeffrey Treating Evlyn KannerBritto, Errol Referring Physician: Aram BeechamSparks, Jeffrey Physician/Extender: Tania AdeWeeks in Treatment: 0 Learning Preferences/Education Level/Primary Language Learning Preference: Explanation Highest Education Level: Grade School Preferred Language: English Cognitive Barrier Assessment/Beliefs Language Barrier: No Translator Needed: No Memory Deficit: No Emotional Barrier: No Cultural/Religious Beliefs Affecting Medical No Care: Physical Barrier Assessment Impaired Vision: No Impaired Hearing: No Decreased Hand dexterity: No Knowledge/Comprehension Assessment Knowledge Level: Medium Comprehension Level: Medium Ability to understand written Medium instructions: Ability to understand verbal Medium instructions: Motivation Assessment Anxiety Level: Calm Cooperation: Cooperative Education Importance: Acknowledges Need Interest in Health Problems: Asks Questions Perception: Coherent Willingness to Engage in Self- High Management Activities: Readiness to Engage in Self- High Management Activities: Tyler [name], Tyler A. ([id number]) Electronic Signature(s) Signed: 08/02/2015 4:39:51 PM By: Elliot GurneyWoody, RN, BSN, Kim RN, BSN Entered By: Elliot GurneyWoody, RN, BSN, Kim on 08/02/2015 15:46:36 Tyler [name], Tyler A.  ([id number]) -------------------------------------------------------------------------------- Fall Risk Assessment Details Tyler [name], Dicky 08/02/2015 3:30 Patient Name: Date of Service: A. PM Medical Record Patient Account Number: [id number] Number: Treating RN: Huel CoventryWoody, Kim Date of Birth/Sex: [birth year] (58 y.o. Male) Other Clinician: Primary Care Physician: Aram BeechamSparks, Jeffrey Treating Evlyn KannerBritto, Errol Referring Physician: Aram BeechamSparks, Jeffrey Physician/Extender: Tania AdeWeeks in Treatment: 0 Fall Risk Assessment Items FALL RISK ASSESSMENT: History of falling - immediate or within 3 months 0 No Secondary diagnosis 0 No Ambulatory aid None/bed rest/wheelchair/nurse 0 Yes Crutches/cane/walker 0 No Furniture 0 No IV Access/Saline Lock 0 No Gait/Training Normal/bed rest/immobile 0 Yes Weak 0 No Impaired 0 No  Mental Status Oriented to own ability 0 No Electronic Signature(s) Signed: 08/02/2015 4:39:51 PM By: Elliot Gurney, RN, BSN, Kim RN, BSN Entered By: Elliot Gurney, RN, BSN, Kim on 08/02/2015 15:46:44 Tyler [name] ([id number]) -------------------------------------------------------------------------------- Foot Assessment Details Tyler [name], Tyler [name] 08/02/2015 3:30 Patient Name: Date of Service: A. PM Medical Record Patient Account Number: [id number] Number: Treating RN: Huel Coventry Date of Birth/Sex: 1956-05-15 (58 y.o. Male) Other Clinician: Primary Care Physician: [name] Treating Evlyn Kanner Referring Physician: [name] Physician/Extender: Tania Ade in Treatment: 0 Foot Assessment Items Site Locations + = Sensation present, - = Sensation absent, C = Callus, U = Ulcer R = Redness, W = Warmth, M = Maceration, PU = Pre-ulcerative lesion F = Fissure, S = Swelling, D = Dryness Assessment Right: Left: Other Deformity: No No Prior Foot Ulcer: No No Prior Amputation: No No Charcot Joint: No No Ambulatory Status: Ambulatory With Help Assistance Device:  Walker GaitFutures trader) Signed: 08/02/2015 4:39:51 PM By: Elliot Gurney, RN, BSN, Kim RN, BSN Entered By: Elliot Gurney, RN, BSN, Kim on 08/02/2015 15:47:17 Tyler [name] ([id number]) Tyler [name], Tyler [name] ([id number]) -------------------------------------------------------------------------------- Nutrition Risk Assessment Details Tyler [name], Tyler [name] 08/02/2015 3:30 Patient Name: Date of Service: A. PM Medical Record Patient Account Number: [id number] Number: Treating RN: Huel Coventry Date of Birth/Sex: 03/03/1956 (58 y.o. Male) Other Clinician: Primary Care Physician: [name] Treating Evlyn Kanner Referring Physician: [name] Physician/Extender: Tania Ade in Treatment: 0 Height (in): 72 Weight (lbs): 359.4 Body Mass Index (BMI): 48.7 Nutrition Risk Assessment Items NUTRITION RISK SCREEN: I have an illness or condition that made me change the kind and/or 0 No amount of food I eat I eat fewer than two meals per day 0 No I eat few fruits and vegetables, or milk products 0 No I have three or more drinks of beer, liquor or wine almost every day 0 No I have tooth or mouth problems that make it hard for me to eat 0 No I don't always have enough money to buy the food I need 0 No I eat alone most of the time 0 No I take three or more different prescribed or over-the-counter drugs a 0 No day Without wanting to, I have lost or gained 10 pounds in the last six 0 No months I am not always physically able to shop, cook and/or feed myself 0 No Nutrition Protocols Good Risk Protocol 0 No interventions needed Moderate Risk Protocol Electronic Signature(s) Signed: 08/02/2015 4:39:51 PM By: Elliot Gurney, RN, BSN, Kim RN, BSN Entered By: Elliot Gurney, RN, BSN, Kim on 08/02/2015 15:46:49
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
## 2015-08-06 ENCOUNTER — Other Ambulatory Visit: Payer: Self-pay | Admitting: *Deleted

## 2015-08-06 NOTE — Progress Notes (Signed)
Tyler, [name] ([id number]) Visit Report for 08/02/2015 Chief Complaint Document Details Tyler [name], Tyler [name] 08/02/2015 3:30 Patient Name: Date of Service: A. PM Medical Record Patient Account Number: [id number] Number: Treating RN: Cornell Barman Date of Birth/Sex: [birth year] (59 y.o. Male) Other Clinician: Primary Care Physician: Fulton Reek Treating Christin Fudge Referring Physician: Fulton Reek Physician/Extender: Suella Grove in Treatment: 0 Information Obtained from: Patient Chief Complaint Patient presents to the wound care center for a consult due non healing wound. he has had swelling of both lower extremities for several years but now has had a large ulcerated area on the right medial posterior foot region. This has been there for about 3 weeks. Electronic Signature(s) Signed: 08/02/2015 4:06:03 PM By: Christin Fudge MD, FACS Entered By: Christin Fudge on 08/02/2015 16:06:03 [name] ([id number]) -------------------------------------------------------------------------------- Debridement Details TREVARIS, PENNELLA 08/02/2015 3:30 Patient Name: Date of Service: A. PM Medical Record Patient Account Number: [id number] Number: Treating RN: Cornell Barman Date of Birth/Sex: [birth year] (59 y.o. Male) Other Clinician: Primary Care Physician: Fulton Reek Treating Christin Fudge Referring Physician: Fulton Reek Physician/Extender: Suella Grove in Treatment: 0 Debridement Performed for Wound #1 Right,Medial Malleolus Assessment: Performed By: Physician Christin Fudge, MD Debridement: Debridement Pre-procedure Yes Verification/Time Out Taken: Start Time: 15:32 Pain Control: Other : lidociane 4% Level: Skin/Subcutaneous Tissue Total Area Debrided (L x 6 (cm) x 6 (cm) = 36 (cm) W): Tissue and other Viable, Exudate, Fibrin/Slough, Skin, Subcutaneous material debrided: Instrument: Forceps, Scissors Bleeding: Minimum Hemostasis  Achieved: Pressure End Time: 16:00 Procedural Pain: 0 Post Procedural Pain: 0 Response to Treatment: Procedure was tolerated well Post Debridement Measurements of Total Wound Length: (cm) 6 Width: (cm) 6 Depth: (cm) 0.2 Volume: (cm) 5.655 Post Procedure Diagnosis Same as Pre-procedure Electronic Signature(s) Signed: 08/02/2015 4:13:02 PM By: Christin Fudge MD, FACS Signed: 08/02/2015 4:39:51 PM By: Gretta Cool RN, BSN, Kim RN, BSN Entered By: Gretta Cool, RN, BSN, Kim on 08/02/2015 15:59:17 [name], [name] ([id number]) -------------------------------------------------------------------------------- HPI Details Tyler, [name] 08/02/2015 3:30 Patient Name: Date of Service: A. PM Medical Record Patient Account Number: [id number] Number: Treating RN: Cornell Barman Date of Birth/Sex: [birth year] (59 y.o. Male) Other Clinician: Primary Care Physician: Fulton Reek Treating Christin Fudge Referring Physician: Fulton Reek Physician/Extender: Suella Grove in Treatment: 0 History of Present Illness Location: large ulcerated area right posterior medial foot Quality: Patient reports No Pain. Severity: Patient states wound are getting worse. Duration: Patient has had the wound for < 3 weeks prior to presenting for treatment Context: The wound appeared gradually over time Modifying Factors: Other treatment(s) tried include:was admitted to hospital for IV antibiotics for about a cellulitis of the left lower extremity Associated Signs and Symptoms: Patient reports having increase swelling. HPI Description: 59 year old gentleman with diabetes mellitus which is uncontrolled, was recently admitted to [facility] for left lower extremity cellulitis and was asked to follow up with [place] by his primary care physician Dr. Fulton Reek. His main issue now is a large blister with an ulcerated area on the right medial posterior part of his foot. The patient's past medical  history significant for diabetes mellitus with renal manifestations and uncontrolled, B12 deficiency, COPD, depression, essential hypertension, history of DVT, lymphedema of both lower extremities, obesity, peripheral neuropathy, stasis dermatitis of both lower legs, valvular heart disease. He is also status orchiectomy, insertion of IVC filter, Left vein stripping in 1991, cholecystectomy, cataract surgery. he is a former smoker and quit about 15 years ago. *Hemoglobin A1c in July 2016 was 7.6. Electronic Signature(s) Signed: 08/02/2015 4:07:31  PM By: Christin Fudge MD, FACS Previous Signature: 08/02/2015 3:40:10 PM Version By: Christin Fudge MD, FACS Entered By: Christin Fudge on 08/02/2015 16:07:31 [name] ([id number]) -------------------------------------------------------------------------------- Physical Exam Details Tyler, [name] 08/02/2015 3:30 Patient Name: Date of Service: A. PM Medical Record Patient Account Number: [id number] Number: Treating RN: Cornell Barman Date of Birth/Sex: Apr 19, 1956 (59 y.o. Male) Other Clinician: Primary Care Physician: Fulton Reek Treating Christin Fudge Referring Physician: Fulton Reek Physician/Extender: Suella Grove in Treatment: 0 Eyes Nonicteric. Reactive to light. Ears, Nose, Mouth, and Throat Lips, teeth, and gums WNL.Marland Kitchen Moist mucosa without lesions . Neck supple and nontender. No palpable supraclavicular or cervical adenopathy. Normal sized without goiter. Respiratory WNL. No retractions.. Cardiovascular Pedal Pulses WNL. ABI measured was 1.57 on the right lower extremity. he has got bilateral stage II lymphedema. Chest Breasts symmetical and no nipple discharge.. Breast tissue WNL, no masses, lumps, or tenderness.. Gastrointestinal (GI) Abdomen without masses or tenderness.. No liver or spleen enlargement or tenderness.. Lymphatic No adneopathy. No adenopathy. No adenopathy. Musculoskeletal Adexa without  tenderness or enlargement.. Digits and nails w/o clubbing, cyanosis, infection, petechiae, ischemia, or inflammatory conditions.. Integumentary (Hair, Skin) No suspicious lesions. No crepitus or fluctuance. No peri-wound warmth or erythema. No masses.Marland Kitchen Psychiatric Judgement and insight Intact.. No evidence of depression, anxiety, or agitation.. Notes on the right foot medially and posteriorly near the base of his ankle he has a large bleb which has macerated skin and subcutaneous tissue and this was sharply dissected with scissors and forceps. Electronic Signature(s) Signed: 08/02/2015 4:09:23 PM By: Christin Fudge MD, FACS Entered By: Christin Fudge on 08/02/2015 16:09:22 [name] ([id number]) -------------------------------------------------------------------------------- Physician Orders Details IOAN, LANDINI 08/02/2015 3:30 Patient Name: Date of Service: A. PM Medical Record Patient Account Number: [id number] Number: Treating RN: Cornell Barman Date of Birth/Sex: [birth year] (59 y.o. Male) Other Clinician: Primary Care Physician: Fulton Reek Treating Christin Fudge Referring Physician: Fulton Reek Physician/Extender: Suella Grove in Treatment: 0 Verbal / Phone Orders: Yes Clinician: Cornell Barman Read Back and Verified: Yes Diagnosis Coding ICD-10 Coding Code Description E11.622 Type 2 diabetes mellitus with other skin ulcer L97.512 Non-pressure chronic ulcer of other part of right foot with fat layer exposed G38.75 Chronic diastolic (congestive) heart failure E66.01 Morbid (severe) obesity due to excess calories Z79.01 Long term (current) use of anticoagulants I89.0 Lymphedema, not elsewhere classified F17.209 Nicotine dependence, unspecified, with unspecified nicotine-induced disorders Wound Cleansing Wound #1 Right,Medial Malleolus o Clean wound with Normal Saline. Anesthetic Wound #1 Right,Medial Malleolus o Topical Lidocaine 4% cream  applied to wound bed prior to debridement Primary Wound Dressing Wound #1 Right,Medial Malleolus o Other: - Aquacel Ag foam Secondary Dressing Wound #1 Right,Medial Malleolus o ABD pad Dressing Change Frequency Wound #1 Right,Medial Malleolus o Change Dressing Monday, Wednesday, Friday Follow-up Appointments Wound #1 Right,Medial Malleolus Suber, Clare A. ([id number]) o Return Appointment in 1 week. Edema Control Wound #1 Right,Medial Malleolus o 4-Layer Compression System - Right Lower Extremity Additional Orders / Instructions Wound #1 Right,Medial Malleolus o Stop Smoking o Increase protein intake. Home Health Wound #1 Lyman Visits o Home Health Nurse may visit PRN to address patientos wound care needs. o FACE TO FACE ENCOUNTER: MEDICARE and MEDICAID PATIENTS: I certify that this patient is under my care and that I had a face-to-face encounter that meets the physician face-to-face encounter requirements with this patient on this date. The encounter with the patient was in whole or in part for the following MEDICAL CONDITION: (primary reason for  Home Healthcare) MEDICAL NECESSITY: I certify, that based on my findings, NURSING services are a medically necessary home health service. HOME BOUND STATUS: I certify that my clinical findings support that this patient is homebound (i.e., Due to illness or injury, pt requires aid of supportive devices such as crutches, cane, wheelchairs, walkers, the use of special transportation or the assistance of another person to leave their place of residence. There is a normal inability to leave the home and doing so requires considerable and taxing effort. Other absences are for medical reasons / religious services and are infrequent or of short duration when for other reasons). o If current dressing causes regression in wound condition, may D/C ordered dressing product/s and  apply Normal Saline Moist Dressing daily until next [place] / Other MD appointment. [place] of regression in wound condition at [phone]. o Please direct any NON-WOUND related issues/requests for orders to patient's Primary Care Physician Oxygen Administration Wound #1 Right,Medial Malleolus o While patient is in clinic, provide supplimental oxygen via nasal cannula at liters/min __ : - 3L Electronic Signature(s) Signed: 08/02/2015 4:39:51 PM By: Gretta Cool RN, BSN, Kim RN, BSN Signed: 08/05/2015 4:17:28 PM By: Christin Fudge MD, FACS Entered By: Gretta Cool, RN, BSN, Kim on 08/02/2015 16:19:27 DEARIES, MEIKLE ([id number]) -------------------------------------------------------------------------------- Problem List Details JERZY, ROEPKE 08/02/2015 3:30 Patient Name: Date of Service: A. PM Medical Record Patient Account Number: [id number] Number: Treating RN: Cornell Barman Date of Birth/Sex: [birth year] (59 y.o. Male) Other Clinician: Primary Care Physician: Fulton Reek Treating Christin Fudge Referring Physician: Fulton Reek Physician/Extender: Suella Grove in Treatment: 0 Active Problems ICD-10 Encounter Code Description Active Date Diagnosis E11.622 Type 2 diabetes mellitus with other skin ulcer 08/02/2015 Yes L97.512 Non-pressure chronic ulcer of other part of right foot with 08/02/2015 Yes fat layer exposed R74.08 Chronic diastolic (congestive) heart failure 08/02/2015 Yes E66.01 Morbid (severe) obesity due to excess calories 08/02/2015 Yes Z79.01 Long term (current) use of anticoagulants 08/02/2015 Yes I89.0 Lymphedema, not elsewhere classified 08/02/2015 Yes F17.209 Nicotine dependence, unspecified, with unspecified 08/02/2015 Yes nicotine-induced disorders Inactive Problems Resolved Problems Electronic Signature(s) Signed: 08/02/2015 4:07:58 PM By: Christin Fudge MD, FACS Previous Signature: 08/02/2015 4:05:31 PM  Version By: Christin Fudge MD, FACS Previous Signature: 08/02/2015 3:42:04 PM Version By: Christin Fudge MD, FACS [name] ([id number]) Entered By: Christin Fudge on 08/02/2015 16:07:58 [name] ([id number]) -------------------------------------------------------------------------------- Progress Note Details DVAUGHN, FICKLE 08/02/2015 3:30 Patient Name: Date of Service: A. PM Medical Record Patient Account Number: [id number] Number: Treating RN: Cornell Barman Date of Birth/Sex: 11/11/1955 (59 y.o. Male) Other Clinician: Primary Care Physician: Fulton Reek Treating Christin Fudge Referring Physician: Fulton Reek Physician/Extender: Suella Grove in Treatment: 0 Subjective Chief Complaint Information obtained from Patient Patient presents to the wound care center for a consult due non healing wound. he has had swelling of both lower extremities for several years but now has had a large ulcerated area on the right medial posterior foot region. This has been there for about 3 weeks. History of Present Illness (HPI) The following HPI elements were documented for the patient's wound: Location: large ulcerated area right posterior medial foot Quality: Patient reports No Pain. Severity: Patient states wound are getting worse. Duration: Patient has had the wound for < 3 weeks prior to presenting for treatment Context: The wound appeared gradually over time Modifying Factors: Other treatment(s) tried include:was admitted to hospital for IV antibiotics for about a cellulitis of the left lower extremity Associated Signs and Symptoms: Patient reports  having increase swelling. 59 year old gentleman with diabetes mellitus which is uncontrolled, was recently admitted to [facility] for left lower extremity cellulitis and was asked to follow up with [place] by his primary care physician Dr. Fulton Reek. His main issue now is a large blister with  an ulcerated area on the right medial posterior part of his foot. The patient's past medical history significant for diabetes mellitus with renal manifestations and uncontrolled, B12 deficiency, COPD, depression, essential hypertension, history of DVT, lymphedema of both lower extremities, obesity, peripheral neuropathy, stasis dermatitis of both lower legs, valvular heart disease. He is also status orchiectomy, insertion of IVC filter, Left vein stripping in 1991, cholecystectomy, cataract surgery. he is a former smoker and quit about 15 years ago. *Hemoglobin A1c in July 2016 was 7.6. Wound History Patient presents with 1 open wound that has been present for approximately 4weeks. Patient has been treating wound in the following manner: neosporin; calcium algina. Laboratory tests have been performed in the last month. Patient reportedly has not tested positive for an antibiotic resistant organism. Patient reportedly has not tested positive for osteomyelitis. Patient reportedly has had testing performed to evaluate circulation in the legs. Patient experiences the following problems associated with their wounds: infection. EULALIO, REAMY ([id number]) Patient History Information obtained from Patient, Chart. Allergies enalapril, atorvastatin, lovastatin Family History Diabetes - Siblings, Lung Disease - Mother, Stroke - Father, No family history of Cancer, Heart Disease, Hypertension, Kidney Disease, Seizures, Thyroid Problems, Tuberculosis. Social History Former smoker, Marital Status - Married, Alcohol Use - Never, Drug Use - No History. Medical History Eyes Patient has history of Cataracts - 2016 Denies history of Glaucoma, Optic Neuritis Ear/Nose/Mouth/Throat Denies history of Chronic sinus problems/congestion, Middle ear problems Hematologic/Lymphatic Patient has history of Anemia, Lymphedema Denies history of Hemophilia, Human Immunodeficiency Virus, Sickle Cell  Disease Respiratory Patient has history of Chronic Obstructive Pulmonary Disease (COPD) Denies history of Aspiration, Asthma, Pneumothorax, Sleep Apnea, Tuberculosis Cardiovascular Patient has history of Congestive Heart Failure, Deep Vein Thrombosis Denies history of Angina, Arrhythmia, Coronary Artery Disease, Hypertension, Hypotension, Myocardial Infarction, Peripheral Arterial Disease, Peripheral Venous Disease, Phlebitis, Vasculitis Gastrointestinal Denies history of Cirrhosis , Colitis, Crohn s, Hepatitis A, Hepatitis B, Hepatitis C Endocrine Patient has history of Type II Diabetes Denies history of Type I Diabetes Genitourinary Patient has history of End Stage Renal Disease Immunological Denies history of Lupus Erythematosus, Raynaud s, Scleroderma Integumentary (Skin) Denies history of History of Burn, History of pressure wounds Musculoskeletal Patient has history of Gout Denies history of Rheumatoid Arthritis, Osteoarthritis, Osteomyelitis Neurologic Patient has history of Neuropathy Denies history of Quadriplegia, Paraplegia, Seizure Disorder Oncologic Denies history of Received Chemotherapy, Received Radiation ARIANA, CAVENAUGH A. ([id number]) Psychiatric Denies history of Anorexia/bulimia, Confinement Anxiety Patient is treated with Insulin. Blood sugar is tested. Blood sugar results noted at the following times: Breakfast - 192. Hospitalization/Surgery History - 06/22/2015, [facility], Cellulitis. Medical And Surgical History Notes Constitutional Symptoms (General Health) Chronic Renal Insufficiency; COPD; DMII; DVT; PE-multiple episodes; Lymphedema; Obesity; neuropathy; sleep apnea; CHF; Anemia; gout; Respiratory Pulmonary Embolism Cardiovascular Vein Stripping-left 1991 Review of Systems (ROS) Constitutional Symptoms (General Health) Complains or has symptoms of Fatigue. Denies complaints or symptoms of Fever, Chills. Eyes Denies complaints or symptoms of Dry  Eyes, Vision Changes, Glasses / Contacts. Ear/Nose/Mouth/Throat The patient has no complaints or symptoms. Hematologic/Lymphatic The patient has no complaints or symptoms. Respiratory Complains or has symptoms of Shortness of Breath. Denies complaints or symptoms of Chronic or frequent coughs. Cardiovascular Complains or  has symptoms of LE edema. Denies complaints or symptoms of Chest pain. Gastrointestinal The patient has no complaints or symptoms. Endocrine Complains or has symptoms of Thyroid disease. Denies complaints or symptoms of Hepatitis, Polydypsia (Excessive Thirst). Genitourinary The patient has no complaints or symptoms. Immunological The patient has no complaints or symptoms. Integumentary (Skin) Complains or has symptoms of Wounds, Bleeding or bruising tendency. Denies complaints or symptoms of Breakdown, Swelling. Musculoskeletal Complains or has symptoms of Muscle Weakness. Denies complaints or symptoms of Muscle Pain. Neurologic The patient has no complaints or symptoms. Oncologic DWAIN, HUHN ([id number]) The patient has no complaints or symptoms. Psychiatric The patient has no complaints or symptoms. Medications aspirin 81 mg tablet,delayed release oral 1 1 tablet,delayed release (DR/EC) oral gabapentin 100 mg capsule oral 1 1 capsule oral ondansetron 4 mg disintegrating tablet oral 1 1 tablet,disintegrating oral hydroxyzine HCl 25 mg tablet oral 1 1 tablet oral pravastatin 40 mg tablet oral 1 1 tablet oral tamsulosin 0.4 mg capsule oral 1 1 capsule,extended release 24hr oral Serevent Diskus 50 mcg/dose powder for inhalation inhalation 1 1 blister with device inhalation ipratropium-albuterol 0.5 mg-3 mg(2.5 mg base)/3 mL nebulization soln inhalation 1 1 solution for nebulization inhalation Ceftin 500 mg tablet oral tablet oral colchicine 0.6 mg tablet oral 1 1 tablet oral ammonium lactate 12 %-12 % topical kit topical kit topical Novolin N 100  unit/mL subcutaneous suspension subcutaneous suspension subcutaneous Novolin R 100 unit/mL injection solution injection solution injection docusate sodium 100 mg capsule oral 1 1 capsule oral lactulose 10 gram/15 mL (15 mL) oral solution oral solution oral Miralax 17 gram oral powder packet oral 1 1 powder in packet oral furosemide 40 mg tablet oral 1 1 tablet oral torsemide 100 mg tablet oral 1 1 tablet oral oxycodone-acetaminophen 5 mg-325 mg tablet oral 2 2 tablet oral fluticasone 50 mcg/actuation nasal spray,suspension nasal spray,suspension nasal spironolactone 25 mg tablet oral 1 1 tablet oral paroxetine 20 mg tablet oral 1 1 tablet oral levothyroxine 100 mcg tablet oral 1 1 tablet oral cyanocobalamin (vit B-12) 1,000 mcg/mL injection solution injection 1 1 solution injection cyanocobalamin (vit B-12) 2,000 mcg tablet oral 1 1 tablet oral Objective Constitutional Vitals Time Taken: 3:30 PM, Height: 72 in, Source: Stated, Weight: 359.4 lbs, Source: Stated, BMI: 48.7, Temperature: 97.7 F, Pulse: 82 bpm, Respiratory Rate: 18 breaths/min, Blood Pressure: 137/65 mmHg, Pulse Oximetry: 3 %. Eyes Nonicteric. Reactive to light. Koebel, Oswaldo A. ([id number]) Ears, Nose, Mouth, and Throat Lips, teeth, and gums WNL.Marland Kitchen Moist mucosa without lesions . Neck supple and nontender. No palpable supraclavicular or cervical adenopathy. Normal sized without goiter. Respiratory WNL. No retractions.. Cardiovascular Pedal Pulses WNL. ABI measured was 1.57 on the right lower extremity. he has got bilateral stage II lymphedema. Chest Breasts symmetical and no nipple discharge.. Breast tissue WNL, no masses, lumps, or tenderness.. Gastrointestinal (GI) Abdomen without masses or tenderness.. No liver or spleen enlargement or tenderness.. Lymphatic No adneopathy. No adenopathy. No adenopathy. Musculoskeletal Adexa without tenderness or enlargement.. Digits and nails w/o clubbing, cyanosis,  infection, petechiae, ischemia, or inflammatory conditions.Marland Kitchen Psychiatric Judgement and insight Intact.. No evidence of depression, anxiety, or agitation.. General Notes: on the right foot medially and posteriorly near the base of his ankle he has a large bleb which has macerated skin and subcutaneous tissue and this was sharply dissected with scissors and forceps. Integumentary (Hair, Skin) No suspicious lesions. No crepitus or fluctuance. No peri-wound warmth or erythema. No masses.. Wound #1 status is Open. Original cause of wound  was Blister. The wound is located on the Right,Medial Malleolus. The wound measures 6cm length x 6cm width x 0.2cm depth; 28.274cm^2 area and 5.655cm^3 volume. The wound is limited to skin breakdown. There is a large amount of serous drainage noted. The wound margin is indistinct and nonvisible. There is a large (67-100%) amount of necrotic tissue within the wound bed including Adherent Slough. The periwound skin appearance exhibited: Moist. The periwound skin appearance did not exhibit: Callus, Crepitus, Excoriation, Fluctuance, Friable, Induration, Localized Edema, Rash, Scarring, Dry/Scaly, Maceration, Atrophie Blanche, Cyanosis, Ecchymosis, Hemosiderin Staining, Mottled, Pallor, Rubor, Erythema. Assessment KARA, MIERZEJEWSKI A. ([id number]) Active Problems ICD-10 E11.622 - Type 2 diabetes mellitus with other skin ulcer L97.512 - Non-pressure chronic ulcer of other part of right foot with fat layer exposed I50.32 - Chronic diastolic (congestive) heart failure E66.01 - Morbid (severe) obesity due to excess calories Z79.01 - Long term (current) use of anticoagulants I89.0 - Lymphedema, not elsewhere classified F17.209 - Nicotine dependence, unspecified, with unspecified nicotine-induced disorders 59 year old morbidly obese gentleman with diabetes mellitus type 2 has a Wagner stage II diabetic foot ulcer which has been complicated by chronic lymphedema both  lower extremities. He also has uncontrolled diabetes mellitus and polyneuropathy. He has been fully worked up by vascular surgery in the past and we will try and get these reports. He has been tolerating compression stockings which have been ordered for him from the vascular surgery department. At the present time I will recommend a course of Hg foam to his wound with a Profore lite to be wrapped around his right lower extremity. Care instructions are given that if he finds this getting too tight or if his toes turn blue or numb he should remove the wrap immediately and then use his compression wraps as before. I have also spent a great deal of time discussing with him the need to completely give up smoking. Risk benefits and methods of this have been discussed. He says he is going to be compliant. Procedures Wound #1 Wound #1 is a Venous Leg Ulcer located on the Right,Medial Malleolus . There was a Skin/Subcutaneous Tissue Debridement ([id number]) debridement with total area of 36 sq cm performed by Christin Fudge, MD. with the following instrument(s): Forceps and Scissors to remove Viable tissue/material including Exudate, Fibrin/Slough, Skin, and Subcutaneous after achieving pain control using Other (lidociane 4%). A time out was conducted prior to the start of the procedure. A Minimum amount of bleeding was controlled with Pressure. The procedure was tolerated well with a pain level of 0 throughout and a pain level of 0 following the procedure. Post Debridement Measurements: 6cm length x 6cm width x 0.2cm depth; 5.655cm^3 volume. Post procedure Diagnosis Wound #1: Same as Pre-Procedure Aigner, Kamauri A. ([id number]) Plan Wound Cleansing: Wound #1 Right,Medial Malleolus: Clean wound with Normal Saline. Anesthetic: Wound #1 Right,Medial Malleolus: Topical Lidocaine 4% cream applied to wound bed prior to debridement Primary Wound Dressing: Wound #1 Right,Medial Malleolus: Other: -  Aquacel Ag foam Secondary Dressing: Wound #1 Right,Medial Malleolus: ABD pad Dressing Change Frequency: Wound #1 Right,Medial Malleolus: Change Dressing Monday, Wednesday, Friday Follow-up Appointments: Wound #1 Right,Medial Malleolus: Return Appointment in 1 week. Edema Control: Wound #1 Right,Medial Malleolus: 4-Layer Compression System - Right Lower Extremity Additional Orders / Instructions: Wound #1 Right,Medial Malleolus: Stop Smoking Increase protein intake. Home Health: Wound #1 Right,Medial Malleolus: [place] Nurse may visit PRN to address patient s wound care needs. FACE TO FACE ENCOUNTER: MEDICARE and MEDICAID PATIENTS: I  certify that this patient is under my care and that I had a face-to-face encounter that meets the physician face-to-face encounter requirements with this patient on this date. The encounter with the patient was in whole or in part for the following MEDICAL CONDITION: (primary reason for [place]) MEDICAL NECESSITY: I certify, that based on my findings, NURSING services are a medically necessary home health service. HOME BOUND STATUS: I certify that my clinical findings support that this patient is homebound (i.e., Due to illness or injury, pt requires aid of supportive devices such as crutches, cane, wheelchairs, walkers, the use of special transportation or the assistance of another person to leave their place of residence. There is a normal inability to leave the home and doing so requires considerable and taxing effort. Other absences are for medical reasons / religious services and are infrequent or of short duration when for other reasons). If current dressing causes regression in wound condition, may D/C ordered dressing product/s and apply Normal Saline Moist Dressing daily until next [place] / Other MD appointment. [place] of regression in wound condition at  [phone]. Please direct any NON-WOUND related issues/requests for orders to patient's Primary Care Physician Oxygen Administration: Wound #1 Right,Medial Malleolus: While patient is in clinic, provide supplimental oxygen via nasal cannula at liters/min __ : - 3L TIWAN, SCHNITKER. ([id number]) 59 year old morbidly obese gentleman with diabetes mellitus type 2 has a Wagner stage II diabetic foot ulcer which has been complicated by chronic lymphedema both lower extremities. He also has uncontrolled diabetes mellitus and polyneuropathy. He has been fully worked up by vascular surgery in the past and we will try and get these reports. He has been tolerating compression stockings which have been ordered for him from the vascular surgery department. At the present time I will recommend a course of Hg foam to his wound with a Profore lite to be wrapped around his right lower extremity. Care instructions are given that if he finds this getting too tight or if his toes turn blue or numb he should remove the wrap immediately and then use his compression wraps as before. I have also spent a great deal of time discussing with him the need to completely give up smoking. Risk benefits and methods of this have been discussed. He says he is going to be compliant. Electronic Signature(s) Signed: 08/05/2015 4:18:50 PM By: Christin Fudge MD, FACS Previous Signature: 08/02/2015 4:11:42 PM Version By: Christin Fudge MD, FACS Entered By: Christin Fudge on 08/05/2015 16:18:49 [name] ([id number]) -------------------------------------------------------------------------------- ROS/PFSH Details KEVYN, BOQUET 08/02/2015 3:30 Patient Name: Date of Service: A. PM Medical Record Patient Account Number: [id number] Number: Treating RN: Cornell Barman Date of Birth/Sex: 16-May-1956 (59 y.o. Male) Other Clinician: Primary Care Physician: Fulton Reek Treating Christin Fudge Referring  Physician: Fulton Reek Physician/Extender: Suella [place] in Treatment: 0 Information Obtained From Patient Chart Wound History Do you currently have one or more open woundso Yes How many open wounds do you currently haveo 1 Approximately how long have you had your woundso 4weeks How have you been treating your wound(s) until nowo neosporin; calcium algina Has your wound(s) ever healed and then re-openedo No Have you had any lab work done in the past montho Yes Have you tested positive for an antibiotic resistant organism (MRSA, No VRE)o Have you tested positive for osteomyelitis (bone infection)o No Have you had any tests for circulation on your legso Yes Who ordered the testo [facility] Have you had other problems  associated with your woundso Infection Constitutional Symptoms (General Health) Complaints and Symptoms: Positive for: Fatigue Negative for: Fever; Chills Medical History: Past Medical History Notes: Chronic Renal Insufficiency; COPD; DMII; DVT; PE-multiple episodes; Lymphedema; Obesity; neuropathy; sleep apnea; CHF; Anemia; gout; Eyes Complaints and Symptoms: Negative for: Dry Eyes; Vision Changes; Glasses / Contacts Medical History: Positive for: Cataracts - 2016 Negative for: Glaucoma; Optic Neuritis Respiratory Kelleher, Lenox A. ([id number]) Complaints and Symptoms: Positive for: Shortness of Breath Negative for: Chronic or frequent coughs Medical History: Positive for: Chronic Obstructive Pulmonary Disease (COPD) Negative for: Aspiration; Asthma; Pneumothorax; Sleep Apnea; Tuberculosis Past Medical History Notes: Pulmonary Embolism Cardiovascular Complaints and Symptoms: Positive for: LE edema Negative for: Chest pain Medical History: Positive for: Congestive Heart Failure; Deep Vein Thrombosis Negative for: Angina; Arrhythmia; Coronary Artery Disease; Hypertension; Hypotension; Myocardial Infarction; Peripheral Arterial Disease; Peripheral Venous Disease;  Phlebitis; Vasculitis Past Medical History Notes: Vein Stripping-left 1991 Endocrine Complaints and Symptoms: Positive for: Thyroid disease Negative for: Hepatitis; Polydypsia (Excessive Thirst) Medical History: Positive for: Type II Diabetes Negative for: Type I Diabetes Time with diabetes: 20 plius Treated with: Insulin Blood sugar tested every day: Yes Tested : twice Blood sugar testing results: Breakfast: 192 Integumentary (Skin) Complaints and Symptoms: Positive for: Wounds; Bleeding or bruising tendency Negative for: Breakdown; Swelling Medical History: Negative for: History of Burn; History of pressure wounds Musculoskeletal Complaints and Symptoms: Positive for: Muscle Weakness Dung, Aleksei A. ([id number]) Negative for: Muscle Pain Medical History: Positive for: Gout Negative for: Rheumatoid Arthritis; Osteoarthritis; Osteomyelitis Psychiatric Complaints and Symptoms: No Complaints or Symptoms Complaints and Symptoms: Negative for: Anxiety; Claustrophobia Medical History: Negative for: Anorexia/bulimia; Confinement Anxiety Ear/Nose/Mouth/Throat Complaints and Symptoms: No Complaints or Symptoms Medical History: Negative for: Chronic sinus problems/congestion; Middle ear problems Hematologic/Lymphatic Complaints and Symptoms: No Complaints or Symptoms Medical History: Positive for: Anemia; Lymphedema Negative for: Hemophilia; Human Immunodeficiency Virus; Sickle Cell Disease Gastrointestinal Complaints and Symptoms: No Complaints or Symptoms Medical History: Negative for: Cirrhosis ; Colitis; Crohnos; Hepatitis A; Hepatitis B; Hepatitis C Genitourinary Complaints and Symptoms: No Complaints or Symptoms Medical History: Positive for: End Stage Renal Disease Immunological Crookshanks, Tristyn A. ([id number]) Complaints and Symptoms: No Complaints or Symptoms Medical History: Negative for: Lupus Erythematosus; Raynaudos;  Scleroderma Neurologic Complaints and Symptoms: No Complaints or Symptoms Medical History: Positive for: Neuropathy Negative for: Quadriplegia; Paraplegia; Seizure Disorder Oncologic Complaints and Symptoms: No Complaints or Symptoms Medical History: Negative for: Received Chemotherapy; Received Radiation HBO Extended History Items Eyes: Cataracts Hospitalization / Surgery History Name of Hospital Purpose of Hospitalization/Surgery Date [facility] Cellulitis 06/22/2015 Family and Social History Cancer: No; Diabetes: Yes - Siblings; Heart Disease: No; Hypertension: No; Kidney Disease: No; Lung Disease: Yes - Mother; Seizures: No; Stroke: Yes - Father; Thyroid Problems: No; Tuberculosis: No; Former smoker; Marital Status - Married; Alcohol Use: Never; Drug Use: No History Physician Affirmation I have reviewed and agree with the above information. Electronic Signature(s) Signed: 08/02/2015 4:13:02 PM By: Christin Fudge MD, FACS Signed: 08/02/2015 4:39:51 PM By: Gretta Cool RN, BSN, Kim RN, BSN Previous Signature: 08/02/2015 3:32:54 PM Version By: Christin Fudge MD, FACS Entered By: Gretta Cool RN, BSN, Kim on 08/02/2015 15:45:14 [name] ([id number]) -------------------------------------------------------------------------------- SuperBill Details Patient Name: Lebron Conners A. Date of Service: 08/02/2015 Medical Record Number: [id number] Patient Account Number: [id number] Date of Birth/Sex: 19-Jan-1956 (59 y.o. Male) Treating RN: Cornell Barman Primary Care Physician: Fulton Reek Other Clinician: Referring Physician: Fulton Reek Treating Physician/Extender: Frann Rider in Treatment: 0 Diagnosis Coding ICD-10 Codes Code Description [phone] Type 2  diabetes mellitus with other skin ulcer L97.512 Non-pressure chronic ulcer of other part of right foot with fat layer exposed E17.47 Chronic diastolic (congestive) heart failure E66.01 Morbid (severe) obesity due to  excess calories Z79.01 Long term (current) use of anticoagulants I89.0 Lymphedema, not elsewhere classified F17.209 Nicotine dependence, unspecified, with unspecified nicotine-induced disorders Facility Procedures CPT4: Description Modifier Quantity Code [id number] 99213 - WOUND CARE VISIT-LEV 3 EST PT 1 CPT4: 28979150 11042 - DEB SUBQ TISSUE 20 SQ CM/< 1 ICD-10 Description Diagnosis E11.622 Type 2 diabetes mellitus with other skin ulcer L97.512 Non-pressure chronic ulcer of other part of right foot with fat layer exposed I89.0 Lymphedema, not elsewhere  classified CPT4: 41364383 11045 - DEB SUBQ TISS EA ADDL 20CM 1 ICD-10 Description Diagnosis E11.622 Type 2 diabetes mellitus with other skin ulcer L97.512 Non-pressure chronic ulcer of other part of right foot with fat layer exposed I89.0 Lymphedema, not elsewhere  classified CPT4: 77939688 99406-SMOKING CESSATION 3-10MINS 1 ICD-10 Description Diagnosis E11.622 Type 2 diabetes mellitus with other skin ulcer F17.209 Nicotine dependence, unspecified, with unspecified nicotine-induced disorders TAESEAN, RETH A. ([id number]) Physician Procedures CPT4: Description Modifier Quantity Code 1828833 74451 - WC PHYS LEVEL 4 - NEW PT 25 1 ICD-10 Description Diagnosis E11.622 Type 2 diabetes mellitus with other skin ulcer L97.512 Non-pressure chronic ulcer of other part of right foot with fat layer  exposed Q60.47 Chronic diastolic (congestive) heart failure E66.01 Morbid (severe) obesity due to excess calories CPT4: 9987215 11042 - WC PHYS SUBQ TISS 20 SQ CM 1 ICD-10 Description Diagnosis E11.622 Type 2 diabetes mellitus with other skin ulcer L97.512 Non-pressure chronic ulcer of other part of right foot with fat layer exposed I89.0 Lymphedema, not elsewhere  classified CPT4: 8727618 48592 - WC PHYS SUBQ TISS EA ADDL 20 CM 1 ICD-10 Description Diagnosis E11.622 Type 2 diabetes mellitus with other skin ulcer L97.512 Non-pressure chronic ulcer of other part of  right foot with fat layer exposed I89.0 Lymphedema, not  elsewhere classified CPT4: 99406 99406- SMOKING CESSATION 3-10 MINS 1 ICD-10 Description Diagnosis E11.622 Type 2 diabetes mellitus with other skin ulcer F17.209 Nicotine dependence, unspecified, with unspecified nicotine-induced disorders Electronic Signature(s) Signed: 08/02/2015 4:39:51 PM By: Gretta Cool, RN, BSN, Kim RN, BSN Signed: 08/05/2015 4:17:28 PM By: Christin Fudge MD, FACS Previous Signature: 08/02/2015 4:12:38 PM Version By: Christin Fudge MD, FACS Entered By: Gretta Cool RN, BSN, Kim on 08/02/2015 16:20:03

## 2015-08-06 NOTE — Patient Outreach (Signed)
Transition of care call:  Spoke with pt's son who states pt is currently at the hospital (Wound care clinic), spouse with him.   Plan to f/u with pt tomorrow.      Shayne Alkenose M.   Pierzchala RN CCM [facility]  [phone]

## 2015-08-07 ENCOUNTER — Other Ambulatory Visit: Payer: Self-pay | Admitting: *Deleted

## 2015-08-08 NOTE — Patient Outreach (Signed)
11/16 Transition of care call (discharged 10/26):  Pt reports on visit to Wound care center yesterday, was told wound looking better.  Pt reports has infection in legs, Dr. Judithann SheenSparks gave him more antibiotics, sugars going up.  Pt states sugars running 180,190.   Pt states swelling in legs coming down.  Pt states he has issues with unaboot dressings, when it dries up- have severe leg cramps.   Pt states he has his support stockings on now.  Pt states to f/u at Wound care center again 11/18.   Pt states HH PT came today, worked with him, reason so tired now.   Pt states his weights have been staying at 359 lbs.   Discussed with pt plan to have coworker Noralyn Pick(Carroll Spinks RN CM  NP covering for this RN CM) f/u again telephonically 11/25-final transition of care call.      Shayne Alkenose M.   Natalya Domzalski RN CCM [facility]  [phone]

## 2015-08-09 ENCOUNTER — Encounter: Payer: Commercial Managed Care - HMO | Admitting: Surgery

## 2015-08-09 DIAGNOSIS — E11622 Type 2 diabetes mellitus with other skin ulcer: Secondary | ICD-10-CM | POA: Diagnosis not present

## 2015-08-10 NOTE — Progress Notes (Addendum)
Tyler [name], ARBOLEDA ([id number]) Visit Report for [name] Chief Complaint Document Details MCCAULEY, Tyler [name] 11:00 Patient Name: Date of Service: A. AM Medical Record Patient Account Number: [id number] Number: Treating RN: Afful, RN, BSN, [place] Sink Date of Birth/Sex: August 17, 1956 (59 y.o. Male) Other Clinician: Primary Care Treating Sparks, [name], Paz Fuentes Physician: Physician/Extender: Referring Physician: Bing Plume in Treatment: 1 Information Obtained from: Patient Chief Complaint Patient presents to the wound care center for a consult due non healing wound. he has had swelling of both lower extremities for several years but now has had a large ulcerated area on the right medial posterior foot region. This has been there for about 3 weeks. Electronic Signature(s) Signed: 08/09/2015 12:03:12 PM By: Evlyn Kanner MD, FACS Entered By: Evlyn Kanner on [name] 12:03:12 Pauline Aus ([id number]) -------------------------------------------------------------------------------- Debridement Details Morici, Demorris [name] 11:00 Patient Name: Date of Service: A. AM Medical Record Patient Account Number: [id number] Number: Treating RN: Afful, RN, BSN, [place] Sink Date of Birth/Sex: 1956-03-13 (59 y.o. Male) Other Clinician: Primary Care Treating Sparks, [name], Jakirah Zaun Physician: Physician/Extender: Referring Physician: Bing Plume in Treatment: 1 Debridement Performed for Wound #1 Right,Medial Malleolus Assessment: Performed By: Physician Evlyn Kanner, MD Debridement: Debridement Pre-procedure Yes Verification/Time Out Taken: Start Time: 11:54 Pain Control: Lidocaine 4% Topical Solution Level: Skin/Subcutaneous Tissue Total Area Debrided (L x 3 (cm) x 4 (cm) = 12 (cm) W): Tissue and other Non-Viable, Fibrin/Slough, Subcutaneous material debrided: Instrument: Curette Bleeding:  Minimum Hemostasis Achieved: Pressure End Time: 11:57 Procedural Pain: 0 Post Procedural Pain: 0 Response to Treatment: Procedure was tolerated well Post Debridement Measurements of Total Wound Length: (cm) 3 Width: (cm) 4 Depth: (cm) 0.2 Volume: (cm) 1.885 Post Procedure Diagnosis Same as Pre-procedure Electronic Signature(s) Signed: 08/09/2015 12:03:07 PM By: Evlyn Kanner MD, FACS Signed: 08/09/2015 4:12:24 PM By: Elpidio Eric BSN, RN Previous Signature: [name] 11:56:47 AM Version By: Elpidio Eric BSN, RN Entered By: Evlyn Kanner on [name] 12:03:06 RAFI, KENNETH ([id number], DONIGAN A. ([id number]) -------------------------------------------------------------------------------- HPI Details [name], Tyler [name] 11:00 Patient Name: Date of Service: A. AM Medical Record Patient Account Number: [id number] Number: Treating RN: Afful, RN, BSN, [place] Sink Date of Birth/Sex: 1956-06-27 (59 y.o. Male) Other Clinician: Primary Care Treating Sparks, [name], Yeiren Whitecotton Physician: Physician/Extender: Referring Physician: Bing Plume in Treatment: 1 History of Present Illness Location: large ulcerated area right posterior medial foot Quality: Patient reports No Pain. Severity: Patient states wound are getting worse. Duration: Patient has had the wound for < 3 weeks prior to presenting for treatment Context: The wound appeared gradually over time Modifying Factors: Other treatment(s) tried include:was admitted to hospital for IV antibiotics for about a cellulitis of the left lower extremity Associated Signs and Symptoms: Patient reports having increase swelling. HPI Description: 59 year old gentleman with diabetes mellitus which is uncontrolled, was recently admitted to [facility] for left lower extremity cellulitis and was asked to follow up with [place] by his primary care physician Dr. [name]. His main  issue now is a large blister with an ulcerated area on the right medial posterior part of his foot. The patient's past medical history significant for diabetes mellitus with renal manifestations and uncontrolled, B12 deficiency, COPD, depression, essential hypertension, history of DVT, lymphedema of both lower extremities, obesity, peripheral neuropathy, stasis dermatitis of both lower legs, valvular heart disease. He is also status orchiectomy, insertion of IVC filter, Left vein stripping in 1991, cholecystectomy, cataract surgery. he is a former smoker and quit about 15  years ago. *Hemoglobin A1c in July 2016 was 7.6. [name] -- He was seen by Dr. Gilda [name] on 06/27/2015 and was diagnosed with lymphedema and no surgery was recommended. He was given conservative treatment with compression stockings class I, and due to his CHF was not interested in a lymph pump. Due to his wrap slipping a bit he has got a new bleb an ulceration on the dorsum of his right foot which we will be addressing today. Electronic Signature(s) Signed: 08/09/2015 12:05:04 PM By: Evlyn Kanner MD, FACS Previous Signature: [name] 12:03:19 PM Version By: Evlyn Kanner MD, FACS Previous Signature: [name] 10:44:06 AM Version By: Evlyn Kanner MD, FACS Entered By: Evlyn Kanner on [name] 12:05:04 Pauline Aus ([id number]) -------------------------------------------------------------------------------- Physical Exam Details [name], Tyler [name] 11:00 Patient Name: Date of Service: A. AM Medical Record Patient Account Number: [id number] Number: Treating RN: Clover Mealy, RN, BSN, [place] Sink Date of Birth/Sex: 02-18-1956 (59 y.o. Male) Other Clinician: Primary Care Treating Sparks, [name], Glory Buff Physician: Physician/Extender: Referring Physician: Bing Plume in Treatment: 1 Constitutional . Pulse regular. Respirations normal and unlabored. Afebrile. . Eyes Nonicteric.  Reactive to light. Ears, Nose, Mouth, and Throat Lips, teeth, and gums WNL.Marland Kitchen Moist mucosa without lesions . Neck supple and nontender. No palpable supraclavicular or cervical adenopathy. Normal sized without goiter. Respiratory WNL. No retractions.. Cardiovascular Pedal Pulses WNL. No clubbing, cyanosis or edema. Lymphatic No adneopathy. No adenopathy. No adenopathy. Musculoskeletal Adexa without tenderness or enlargement.. Digits and nails w/o clubbing, cyanosis, infection, petechiae, ischemia, or inflammatory conditions.. Integumentary (Hair, Skin) No suspicious lesions. No crepitus or fluctuance. No peri-wound warmth or erythema. No masses.Marland Kitchen Psychiatric Judgement and insight Intact.. No evidence of depression, anxiety, or agitation.. Notes he has significant slough on the right medial part of his posterior heel and this has been sharply debrided with a curette. Due to his wrap slipping a bit he's got a new ulcerated area on the dorsum of his right foot which is more of a bleb and this has been deroofed. Electronic Signature(s) Signed: 08/09/2015 12:04:25 PM By: Evlyn Kanner MD, FACS Entered By: Evlyn Kanner on [name] 12:04:25 Pauline Aus ([id number]) -------------------------------------------------------------------------------- Physician Orders Details BRODERIC, BARA [name] 11:00 Patient Name: Date of Service: A. AM Medical Record Patient Account Number: [id number] Number: Treating RN: Afful, RN, BSN, [place] Sink Date of Birth/Sex: 09/17/1956 (59 y.o. Male) Other Clinician: Primary Care Treating Sparks, [name], Peta Peachey Physician: Physician/Extender: Referring Physician: Bing Plume in Treatment: 1 Verbal / Phone Orders: Yes Clinician: Afful, RN, BSN, Rita Read Back and Verified: Yes Diagnosis Coding Wound Cleansing Wound #1 Right,Medial Malleolus o Cleanse wound with mild soap and water o May shower with  protection. o No tub bath. Wound #2 Right,Dorsal Foot o Cleanse wound with mild soap and water o May shower with protection. o No tub bath. Anesthetic Wound #1 Right,Medial Malleolus o Topical Lidocaine 4% cream applied to wound bed prior to debridement Primary Wound Dressing Wound #1 Right,Medial Malleolus o Aquacel Ag Wound #2 Right,Dorsal Foot o Aquacel Ag Secondary Dressing Wound #1 Right,Medial Malleolus o ABD pad Wound #2 Right,Dorsal Foot o ABD pad Dressing Change Frequency Wound #1 Right,Medial Malleolus o Change dressing every week Osterman, Mamoudou A. ([id number]) Wound #2 Right,Dorsal Foot o Change dressing every week Follow-up Appointments Wound #1 Right,Medial Malleolus o Return Appointment in 2 weeks. Edema Control Wound #1 Right,Medial Malleolus o 4-Layer Compression System - Right Lower Extremity Wound #2 Right,Dorsal Foot o 4-Layer Compression System - Right Lower Extremity Additional Orders / Instructions  Wound #1 Right,Medial Malleolus o Stop Smoking o Increase protein intake. Home Health Wound #1 Right,Medial Malleolus o [facility] o Home Health Nurse may visit PRN to address patientos wound care needs. o FACE TO FACE ENCOUNTER: MEDICARE and MEDICAID PATIENTS: I certify that this patient is under my care and that I had a face-to-face encounter that meets the physician face-to-face encounter requirements with this patient on this date. The encounter with the patient was in whole or in part for the following MEDICAL CONDITION: (primary reason for Home Healthcare) MEDICAL NECESSITY: I certify, that based on my findings, NURSING services are a medically necessary home health service. HOME BOUND STATUS: I certify that my clinical findings support that this patient is homebound (i.e., Due to illness or injury, pt requires aid of supportive devices such as crutches, cane, wheelchairs,  walkers, the use of special transportation or the assistance of another person to leave their place of residence. There is a normal inability to leave the home and doing so requires considerable and taxing effort. Other absences are for medical reasons / religious services and are infrequent or of short duration when for other reasons). o If current dressing causes regression in wound condition, may D/C ordered dressing product/s and apply Normal Saline Moist Dressing daily until next Wound Healing Center / Other MD appointment. Notify Wound Healing Center of regression in wound condition at [phone]. o Please direct any NON-WOUND related issues/requests for orders to patient's Primary Care Physician Wound #2 Right,Dorsal Foot o [facility] o Home Health Nurse may visit PRN to address patientos wound care needs. o FACE TO FACE ENCOUNTER: MEDICARE and MEDICAID PATIENTS: I certify that this patient is under my care and that I had a face-to-face encounter that meets the physician face-to-face encounter requirements with this patient on this date. The encounter with the patient was in [place], Sage A. ([id number]) whole or in part for the following MEDICAL CONDITION: (primary reason for Home Healthcare) MEDICAL NECESSITY: I certify, that based on my findings, NURSING services are a medically necessary home health service. HOME BOUND STATUS: I certify that my clinical findings support that this patient is homebound (i.e., Due to illness or injury, pt requires aid of supportive devices such as crutches, cane, wheelchairs, walkers, the use of special transportation or the assistance of another person to leave their place of residence. There is a normal inability to leave the home and doing so requires considerable and taxing effort. Other absences are for medical reasons / religious services and are infrequent or of short duration when for other  reasons). o If current dressing causes regression in wound condition, may D/C ordered dressing product/s and apply Normal Saline Moist Dressing daily until next Wound Healing Center / Other MD appointment. Notify Wound Healing Center of regression in wound condition at [phone]. o Please direct any NON-WOUND related issues/requests for orders to patient's Primary Care Physician Oxygen Administration Wound #1 Right,Medial Malleolus o While patient is in clinic, provide supplimental oxygen via nasal cannula at liters/min __ : - 3L Wound #2 Right,Dorsal Foot o While patient is in clinic, provide supplimental oxygen via nasal cannula at liters/min __ : - 3L Electronic Signature(s) Signed: 08/09/2015 12:10:14 PM By: Elpidio Eric BSN, RN Signed: 08/09/2015 4:42:27 PM By: Evlyn Kanner MD, FACS Previous Signature: [name] 11:58:48 AM Version By: Elpidio Eric BSN, RN Entered By: Elpidio Eric on [name] 12:10:14 Pauline Aus ([id number]) -------------------------------------------------------------------------------- Problem List Details AKIL, HOOS [name]  11:00 Patient Name: Date of Service: A. AM Medical Record Patient Account Number: [id number] Number: Treating RN: Afful, RN, BSN, [place] Sinkita Date of Birth/Sex: 1955/11/20 36(58 y.o. 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 ([id number] Aus) Cronce, Rylin A. ([id number]) -------------------------------------------------------------------------------- Progress Note Details Mckeone, Ladarian [name] 11:00 Patient Name: Date of Service: A. AM Medical Record Patient Account Number: [id number] Number: Treating RN: Afful, RN, BSN, [place] Sinkita Date of Birth/Sex: 1955/11/20 54(58 y.o. Male) Other Clinician: Primary Care Treating Sparks, Domingo DimesJeffrey Dontai Pember Physician: Physician/Extender: Referring Physician: Bing PlumeSparks, Jeffrey Weeks in Treatment: 1 Subjective Chief Complaint Information obtained from Patient Patient presents to the wound care center for a consult due non healing wound. he has had swelling of both lower extremities for several years but now has had a large ulcerated area on the right medial posterior foot region. This has been there for about 3 weeks. History of Present Illness (HPI) The following HPI elements were documented for the patient's wound: Location: large ulcerated area right posterior medial foot Quality: Patient reports No Pain. Severity: Patient states wound are getting worse. Duration: Patient has had the wound for < 3 weeks prior to presenting for treatment Context: The wound appeared gradually over time Modifying Factors: Other treatment(s) tried include:was admitted to hospital for IV antibiotics for about a cellulitis of the left lower extremity Associated Signs and Symptoms: Patient reports having increase swelling. 59 year old gentleman with diabetes mellitus which is uncontrolled, was recently admitted to [facility] for left lower extremity cellulitis and was asked to follow up with us by his primary care physician Dr. Aram BeechamJeffrey Sparks. His main issue now is a large blister with an ulcerated area on the right medial posterior part of his foot. The patient's past medical history significant for diabetes mellitus with renal manifestations and uncontrolled, B12 deficiency, COPD, depression, essential hypertension, history of DVT, lymphedema of both lower extremities, obesity, peripheral neuropathy, stasis dermatitis of both lower legs, valvular heart disease. He is also status orchiectomy, insertion of IVC filter, Left vein stripping in 1991, cholecystectomy, cataract surgery. he is a former smoker and quit about 15 years ago. *Hemoglobin A1c in July 2016 was 7.6. [name] -- He was seen by Dr. Gilda CreaseSchnier on 06/27/2015 and was diagnosed with lymphedema and no surgery was recommended. He was given conservative treatment with compression stockings class I, and due to his CHF was not interested in a lymph pump. Due to his wrap slipping a bit he has got a new bleb an ulceration on the dorsum of his right foot which we will be addressing today. Peggye PittRRINGTON, Seymour A. ([id number]) Objective Constitutional Pulse regular. Respirations normal and unlabored. Afebrile. Vitals Time Taken: 11:39 AM, Height: 72 in, Weight: 359.4 lbs, BMI: 48.7, Temperature: 97.6 F, Pulse: 52 bpm, Respiratory Rate: 20 breaths/min, Blood Pressure: 116/75 mmHg.  Eyes Nonicteric. Reactive to light. Ears, Nose, Mouth, and Throat Lips, teeth, and gums WNL.Marland Kitchen Moist mucosa without lesions . Neck supple and nontender. No palpable supraclavicular or cervical adenopathy. Normal sized without goiter. Respiratory WNL. No retractions.. Cardiovascular Pedal Pulses WNL. No clubbing, cyanosis or edema. Lymphatic No adneopathy. No adenopathy. No adenopathy. Musculoskeletal Adexa without tenderness or enlargement.. Digits and nails w/o  clubbing, cyanosis, infection, petechiae, ischemia, or inflammatory conditions.Marland Kitchen Psychiatric Judgement and insight Intact.. No evidence of depression, anxiety, or agitation.. General Notes: he has significant slough on the right medial part of his posterior heel and this has been sharply debrided with a curette. Due to his wrap slipping a bit he's got a new ulcerated area on the dorsum of his right foot which is more of a bleb and this has been deroofed. Integumentary (Hair, Skin) No suspicious lesions. No crepitus or fluctuance. No peri-wound warmth or erythema. No masses.. Wound #1 status is Open. Original cause of wound was Blister. The wound is located on the Right,Medial Malleolus. The wound measures 3cm length x 4cm width x 0.2cm depth; 9.425cm^2 area and 1.885cm^3 Durr, Abdo A. ([id number]) volume. The wound is limited to skin breakdown. There is no tunneling or undermining noted. There is a large amount of serous drainage noted. The wound margin is indistinct and nonvisible. There is no granulation within the wound bed. There is a large (67-100%) amount of necrotic tissue within the wound bed including Adherent Slough. The periwound skin appearance exhibited: Callus, Maceration, Moist. The periwound skin appearance did not exhibit: Crepitus, Excoriation, Fluctuance, Friable, Induration, Localized Edema, Rash, Scarring, Dry/Scaly, Atrophie Blanche, Cyanosis, Ecchymosis, Hemosiderin Staining, Mottled, Pallor, Rubor, Erythema. Periwound temperature was noted as No Abnormality. Wound #2 status is Open. Original cause of wound was Shear/Friction. The wound is located on the Right,Dorsal Foot. The wound measures 2cm length x 6cm width x 0.1cm depth; 9.425cm^2 area and 0.942cm^3 volume. The wound is limited to skin breakdown. There is no tunneling or undermining noted. There is a small amount of serosanguineous drainage noted. The wound margin is indistinct and nonvisible. There is  medium (34-66%) red, pale granulation within the wound bed. There is a medium (34-66%) amount of necrotic tissue within the wound bed including Adherent Slough. The periwound skin appearance exhibited: Localized Edema, Dry/Scaly, Moist. The periwound skin appearance did not exhibit: Callus, Crepitus, Excoriation, Fluctuance, Friable, Induration, Rash, Scarring, Maceration, Atrophie Blanche, Cyanosis, Ecchymosis, Hemosiderin Staining, Mottled, Pallor, Rubor, Erythema. Periwound temperature was noted as No Abnormality. 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 Care instructions are given that if he finds this getting too tight or if his toes turn blue or numb he should remove the wrap immediately and then use his compression wraps as before. I have also spent a great deal of time discussing with him the need to completely give up smoking. Risk benefits and methods of this have been discussed. He says he is going  to be compliant. Due to the holiday we will see him back in 2 weeks' time. MATTHE, SLOANE A. ([id number]) Procedures Wound #1 Wound #1 is a Venous Leg Ulcer located on the Right,Medial Malleolus . There was a Skin/Subcutaneous Tissue Debridement ([id number]) debridement with total area of 12 sq cm performed by Evlyn Kanner, MD. with the following  instrument(s): Curette to remove Non-Viable tissue/material including Fibrin/Slough and Subcutaneous after achieving pain control using Lidocaine 4% Topical Solution. A time out was conducted prior to the start of the procedure. A Minimum amount of bleeding was controlled with Pressure. The procedure was tolerated well with a pain level of 0 throughout and a pain level of 0 following the procedure. Post Debridement Measurements: 3cm length x 4cm width x 0.2cm depth; 1.885cm^3 volume. Post procedure Diagnosis Wound #1: Same as Pre-Procedure Plan Wound Cleansing: Wound #1 Right,Medial Malleolus: Cleanse wound with mild soap and water May shower with protection. No tub bath. Wound #2 Right,Dorsal Foot: Cleanse wound with mild soap and water May shower with protection. No tub bath. Anesthetic: Wound #1 Right,Medial Malleolus: Topical Lidocaine 4% cream applied to wound bed prior to debridement Primary Wound Dressing: Wound #1 Right,Medial Malleolus: Aquacel Ag Wound #2 Right,Dorsal Foot: Aquacel Ag Secondary Dressing: Wound #1 Right,Medial Malleolus: ABD pad Wound #2 Right,Dorsal Foot: ABD pad Dressing Change Frequency: Wound #1 Right,Medial Malleolus: Change dressing every week Wound #2 Right,Dorsal Foot: Change dressing every week Follow-up Appointments: Wound #1 Right,Medial Malleolus: Return Appointment in 2 weeks. EARLEY, GROBE A. ([id number]) Edema Control: Wound #1 Right,Medial Malleolus: 4-Layer Compression System - Right Lower Extremity Wound #2 Right,Dorsal Foot: 4-Layer Compression System - Right Lower Extremity Additional Orders / Instructions: Wound #1 Right,Medial Malleolus: Stop Smoking Increase protein intake. Home Health: Wound #1 Right,Medial Malleolus: [facility] Nurse may visit PRN to address patient s wound care needs. FACE TO FACE ENCOUNTER: MEDICARE and MEDICAID PATIENTS: I certify that this patient is  under my care and that I had a face-to-face encounter that meets the physician face-to-face encounter requirements with this patient on this date. The encounter with the patient was in whole or in part for the following MEDICAL CONDITION: (primary reason for Home Healthcare) MEDICAL NECESSITY: I certify, that based on my findings, NURSING services are a medically necessary home health service. HOME BOUND STATUS: I certify that my clinical findings support that this patient is homebound (i.e., Due to illness or injury, pt requires aid of supportive devices such as crutches, cane, wheelchairs, walkers, the use of special transportation or the assistance of another person to leave their place of residence. There is a normal inability to leave the home and doing so requires considerable and taxing effort. Other absences are for medical reasons / religious services and are infrequent or of short duration when for other reasons). If current dressing causes regression in wound condition, may D/C ordered dressing product/s and apply Normal Saline Moist Dressing daily until next Wound Healing Center / Other MD appointment. Notify Wound Healing Center of regression in wound condition at [phone]. Please direct any NON-WOUND related issues/requests for orders to patient's Primary Care Physician Wound #2 Right,Dorsal Foot: Continue Home Health Visits - [facility] may visit PRN to address patient s wound care needs. FACE TO FACE ENCOUNTER: MEDICARE and MEDICAID PATIENTS: I certify that this patient is under my care and that I had a face-to-face encounter that meets the physician face-to-face encounter requirements with this patient on this date. The  encounter with the patient was in whole or in part for the following MEDICAL CONDITION: (primary reason for Home Healthcare) MEDICAL NECESSITY: I certify, that based on my findings, NURSING services are a medically necessary home health  service. HOME BOUND STATUS: I certify that my clinical findings support that this patient is homebound (i.e., Due to illness or injury, pt requires aid of supportive devices such as crutches, cane, wheelchairs, walkers, the use of special transportation or the assistance of another person to leave their place of residence. There is a normal inability to leave the home and doing so requires considerable and taxing effort. Other absences are for medical reasons / religious services and are infrequent or of short duration when for other reasons). If current dressing causes regression in wound condition, may D/C ordered dressing product/s and apply Normal Saline Moist Dressing daily until next Wound Healing Center / Other MD appointment. Notify Wound Healing Center of regression in wound condition at [phone]. Please direct any NON-WOUND related issues/requests for orders to patient's Primary Care Physician Oxygen Administration: Wound #1 Right,Medial Malleolus: While patient is in clinic, provide supplimental oxygen via nasal cannula at liters/min __ : - 3L Wound #2 Right,Dorsal Foot: While patient is in clinic, provide supplimental oxygen via nasal cannula at liters/min __ : - 3L Dreisbach, Hamza A. ([id number]) At the present time I will recommend a course of Aquacel Ag to his wound, including the new wound on the dorsum of his right foot, with a Profore 4layer to be wrapped around his right lower extremity. Care instructions are given that if he finds this getting too tight or if his toes turn blue or numb he should remove the wrap immediately and then use his compression wraps as before. I have also spent a great deal of time discussing with him the need to completely give up smoking. Risk benefits and methods of this have been discussed. He says he is going to be compliant. Due to the holiday we will see him back in 2 weeks' time. Electronic Signature(s) Signed: 08/09/2015 4:41:09 PM  By: Evlyn Kanner MD, FACS Previous Signature: [name] 12:07:02 PM Version By: Evlyn Kanner MD, FACS Entered By: Evlyn Kanner on [name] 16:41:09 Pauline Aus ([id number]) -------------------------------------------------------------------------------- SuperBill Details [name] Date of Service: [name] Patient Name: A. Patient Account Number: [id number] Medical Record Afful, RN, BSN, [id number] Treating RN: Number: [place] Sink Date of Birth/Sex: July 28, 1956 (60 y.o. Male) Other Clinician: Primary Care Physician: [name] Treating Evlyn Kanner Referring Physician: [name] Physician/Extender: Tania Ade in Treatment: 1 Diagnosis Coding ICD-10 Codes Code Description E11.622 Type 2 diabetes mellitus with other skin ulcer L97.512 Non-pressure chronic ulcer of other part of right foot with fat layer exposed I50.32 Chronic diastolic (congestive) heart failure E66.01 Morbid (severe) obesity due to excess calories Z79.01 Long term (current) use of anticoagulants I89.0 Lymphedema, not elsewhere classified F17.209 Nicotine dependence, unspecified, with unspecified nicotine-induced disorders Facility Procedures CPT4 Code Description: 84166063 11042 - DEB SUBQ TISSUE 20 SQ CM/< ICD-10 Description Diagnosis E11.622 Type 2 diabetes mellitus with other skin ulcer L97.512 Non-pressure chronic ulcer of other part of right fo I50.32 Chronic diastolic (congestive)  heart failure I89.0 Lymphedema, not elsewhere classified Modifier: ot with fat lay Quantity: 1 er exposed Physician Procedures CPT4 Code Description: 0160109 99213 - WC PHYS LEVEL 3 - EST PT ICD-10 Description Diagnosis E11.622 Type 2 diabetes mellitus with other skin ulcer L97.512 Non-pressure chronic ulcer of other part of right fo I50.32 Chronic diastolic (congestive) heart  failure I89.0 Lymphedema,  not elsewhere classified Modifier: 25 ot with fat laye Quantity: 1 r exposed CPT4 Code Description:  4098119 11042 - WC PHYS SUBQ TISS 20 SQ CM Description DEWIGHT, CATINO A. ([id number]) Modifier: Quantity: 1 Electronic Signature(s) Signed: 08/09/2015 12:07:29 PM By: Evlyn Kanner MD, FACS Entered By: Evlyn Kanner on [name] 12:07:29
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
## 2015-08-19 ENCOUNTER — Other Ambulatory Visit: Payer: Self-pay | Admitting: *Deleted

## 2015-08-19 NOTE — Patient Outreach (Signed)
Attempt made to contact pt with final transition of care call (discharged 10/26).   HIPPA compliant voice message left with contact number.  If no response, will try again.     Shayne Alkenose M.   Shylah Dossantos RN CCM [facility]  [phone]

## 2015-08-20 ENCOUNTER — Other Ambulatory Visit: Payer: Self-pay | Admitting: *Deleted

## 2015-08-20 NOTE — Patient Outreach (Signed)
Final transition of care call (discharged 10/26):  Pt reports wound looking fine, to f/u at the [facility] 12/2.  Pt reports HH RN coming once a week.  Pt reports blood sugars fine, 218 this am.  Pt reports weight today was 369 lbs.  RN CM inquired about next f/u appointment with Dr. Judithann SheenSparks to which pt states spouse takes care of that.   As discussed with pt, plan to see f/u again 12/5- home visit (provide ongoing community nurse case management services).     Shayne Alkenose M.   Brandee Markin RN CCM [facility]  [phone]

## 2015-08-21 ENCOUNTER — Other Ambulatory Visit: Payer: Self-pay | Admitting: *Deleted

## 2015-08-21 NOTE — Patient Outreach (Signed)
[facility]) Care Management  08/21/2015  Pauline AusMichael A Brunkhorst 07-25-1956 [id number]   Phone call to patient to assess for continued social work involvement.  HIPPA compliant voicemail message left for a return call.    Adriana ReamsChrystal Keonna Raether, LCSW [facility] Care Management [phone]

## 2015-08-23 ENCOUNTER — Ambulatory Visit: Payer: Commercial Managed Care - HMO | Admitting: Surgery

## 2015-08-26 ENCOUNTER — Other Ambulatory Visit: Payer: Self-pay | Admitting: *Deleted

## 2015-08-27 ENCOUNTER — Other Ambulatory Visit: Payer: Self-pay | Admitting: *Deleted

## 2015-08-27 NOTE — Patient Outreach (Signed)
[facility]) Care Management  08/27/2015  Pauline AusMichael A Lucchesi 18-Dec-1955 [id number]   Phone call to patient and spouse to discuss continued need of social work involvement.  Per patient's spouse, she continues to work on completing the [employer] application. Patient's spouse declined need for assistance with this.  Patient has not completed his sleep study due to frequent hospitalizations.    This Child psychotherapistsocial worker spoke with RNCM who has spoke with patient's spouse regarding a palliative care consult who will discuss consult with patient.   Plan:  Case to be closed to social work at this time.               Adriana ReamsChrystal Aladdin Kollmann, LCSW [facility] Care Management [phone]

## 2015-08-27 NOTE — Patient Outreach (Signed)
Follow up phone call: Spoke with spouse Jasmine DecemberSharon, check on pt's status.  Spouse states pt fell today in kitchen coming from the bathroom, had to call for help to get him up.  Spouse states she got a bedside commode from Hospice today, pt already used it once.  Spouse states pt's edema is the same, urinating a little more.  Sp[facility] states [facility] transportation was arranged for pt to f/u with Dr. Gwen PoundsKowalski 12/8.   RN CM discussed doing a home visit 12/20 to which she said to call back on that.      Plan to f/u again telephonically  within next 2 weeks, check on status, schedule home visit if needed.    Shayne Alkenose M.   Shondell Fabel RN CCM [facility] Care Management  [phone]

## 2015-08-27 NOTE — Patient Outreach (Addendum)
[facility]) Care Management   Home visit 08/26/15  Tyler [name] [birth year] [id number]  Tyler [name] is an 59 y.o. male  Subjective:  Pt reports cannot stand, legs give out, been going on for a week now.  Spouse states pt did not go to wound care center 12/2- could not get him out of the car, cancelled appointment.  Spouse states pt is to f/u with Dr.  Gwen [name] 12/8, does not know how she is going to get him there/her car too small.  Spouse states had difficulty getting pt off the toilet one day, had to call EMS who in turn  taught her a technique which she used.   Spouse reports pt urinating small amount, started a couple days ago.   Spouse reports weighed pt a week ago- 364 lbs. Spouse reports pt has a reddened area on left side of scrotum/groin area, called MD who prescribed pt an antibiotic.   Spouse states HH PT took a picture of reddened area 12/3, sent to [facility] RN.  Spouse reports HH RN continues to come to wrap bilateral lower legs.   Spouse reports pt not sleeping at night.   Spouse reports sugars staying under 200, around 120.   Objective:   ROS  Physical Exam  Constitutional: He is oriented to person, place, and time.  Cardiovascular: Normal rate and regular rhythm.   Respiratory:  Diminished lung sounds in bilateral lower lobes posteriorly.    GI: He exhibits distension.  Musculoskeletal: He exhibits edema.  abdomine distended.  + 3 -4  edema in bilateral lower extremities/ top of feet.   Neurological: He is alert and oriented to person, place, and time.  Skin:  unaboot dressings intact to bilateral lower legs.   Psychiatric: His behavior is normal. Judgment and thought content normal.  Dozing during most of home visit.     Current Medications:  Reviewed medications with pt's spouse  Current Outpatient Prescriptions  Medication Sig Dispense Refill  . acetaminophen (TYLENOL) 500 MG tablet Take 1,000 mg by mouth every 6 (six) hours as needed for  mild pain or headache.    . albuterol (PROVENTIL) (2.5 MG/3ML) 0.083% nebulizer solution Take 3 mLs (2.5 mg total) by nebulization every 2 (two) hours as needed for wheezing. 75 mL 12  . alum & mag hydroxide-simeth (MAALOX/MYLANTA) 200-200-20 MG/5ML suspension Take 30 mLs by mouth every 4 (four) hours as needed for indigestion or heartburn. 355 mL 0  . ammonium lactate (LAC-HYDRIN) 12 % lotion Apply topically as needed for dry skin. 400 g 0  . aspirin EC 81 MG EC tablet Take 1 tablet (81 mg total) by mouth daily. 100 tablet 0  . cefUROXime (CEFTIN) 500 MG tablet Take 500 mg by mouth 2 (two) times daily with a meal.    . colchicine 0.6 MG tablet Take 0.6 mg by mouth as needed (for gout flares).     . cyanocobalamin (,VITAMIN B-12,) 1000 MCG/ML injection Inject 1,000 mcg into the muscle every 30 (thirty) days.    . cyanocobalamin 2000 MCG tablet Take 2,000 mcg by mouth daily.    Marland Kitchen docusate sodium (COLACE) 100 MG capsule Take 1 capsule (100 mg total) by mouth 2 (two) times daily. 10 capsule 0  . fluticasone (FLONASE) 50 MCG/ACT nasal spray Place 2 sprays into both nostrils daily as needed for rhinitis.    . furosemide (LASIX) 40 MG tablet Take 1 tablet (40 mg total) by mouth 2 (two) times daily. 60 tablet 5  .  gabapentin (NEURONTIN) 100 MG capsule Take 100 mg by mouth 2 (two) times daily.    . insulin aspart (NOVOLOG) 100 UNIT/ML injection Inject 0-9 Units into the skin 3 (three) times daily with meals. 10 mL 11  . insulin NPH Human (HUMULIN N,NOVOLIN N) 100 UNIT/ML injection Inject into the skin as needed (for high blood sugar). Pt states that he uses per sliding scale.    Marland Kitchen ipratropium-albuterol (DUONEB) 0.5-2.5 (3) MG/3ML SOLN Take 3 mLs by nebulization 4 (four) times daily. 360 mL 5  . lactulose (CHRONULAC) 10 GM/15ML solution Take 20 g by mouth 2 (two) times daily as needed for mild constipation.     Marland Kitchen levothyroxine (SYNTHROID, LEVOTHROID) 100 MCG tablet Take 100 mcg by mouth daily.    Marland Kitchen  PARoxetine (PAXIL) 20 MG tablet Take 20 mg by mouth daily.    . pravastatin (PRAVACHOL) 40 MG tablet Take 40 mg by mouth at bedtime.    . salmeterol (SEREVENT) 50 MCG/DOSE diskus inhaler Inhale 1 puff into the lungs 2 (two) times daily as needed (for shortness of breath).    Marland Kitchen spironolactone (ALDACTONE) 25 MG tablet Take 25 mg by mouth 2 (two) times daily.    . tamsulosin (FLOMAX) 0.4 MG CAPS capsule Take 1 capsule (0.4 mg total) by mouth daily. 30 capsule 5  . torsemide (DEMADEX) 100 MG tablet Take 50 mg by mouth daily.    . cefUROXime (CEFTIN) 250 MG tablet Take 1 tablet (250 mg total) by mouth 2 (two) times daily with a meal. (Patient not taking: Reported on 08/26/2015) 20 tablet 0  . hydrOXYzine (ATARAX/VISTARIL) 25 MG tablet Take 25-50 mg by mouth 3 (three) times daily as needed for itching.    . insulin regular (NOVOLIN R,HUMULIN R) 100 units/mL injection Inject into the skin as needed for high blood sugar. Pt states that he uses per sliding scale.    . ondansetron (ZOFRAN) 4 MG tablet Take 1 tablet (4 mg total) by mouth every 6 (six) hours as needed for nausea. (Patient not taking: Reported on 07/22/2015) 20 tablet 0  . oxyCODONE-acetaminophen (PERCOCET/ROXICET) 5-325 MG tablet Take 2 tablets by mouth every 6 (six) hours as needed for moderate pain. (Patient not taking: Reported on 07/18/2015) 30 tablet 0  . polyethylene glycol (MIRALAX / GLYCOLAX) packet Take 17 g by mouth daily as needed for mild constipation.      No current facility-administered medications for this visit.    Functional Status:   In your present state of health, do you have any difficulty performing the following activities: 08/27/2015 07/11/2015  Hearing? N N  Vision? N N  Difficulty concentrating or making decisions? N N  Walking or climbing stairs? Y Y  Dressing or bathing? Y Y  Doing errands, shopping? Tyler [name]  Preparing Food and eating ? Y -  Using the Toilet? Y -  In the past six months, have you accidently  leaked urine? N -  Do you have problems with loss of bowel control? N -  Managing your Medications? Y -  Managing your Finances? N -  Housekeeping or managing your Housekeeping? Y -    Fall/Depression Screening:    PHQ 2/9 Scores 01/22/2015 12/21/2014  PHQ - 2 Score - 1  Exception Documentation (No Data) -    Assessment:  Heart failure- as reported by spouse pt unable to weigh(unsteady standing), urine output low.  Noted  +3-4 edema in bilateral lower legs/top of feet, Abdomen distended, weak, dozing during most of visit.  Transportation- pt having difficulty getting in/out of spouse's car.   RN CM provided [facility], arrange transportation for pt.                           Skin integrity-  unaboot dressings intact to bilateral lower legs [facility](HH RN changes).  Pt needs to f/u at Wound Care center- spouse to call, schedule appointment (missed last week).   Plan:  RN CM called Dr. Judithann SheenSparks office- closed. Therefore, had spouse call MD office, message left for on call MD            About pt's urine output low, weak, edema.   Spouse received return call from covering MD- discussed pt could             Be dehydrated, wanted to decrease dosage on pt's fluid medication to which spouse was not open to, MD to             Leave message with Dr. Judithann SheenSparks.              RN CM discussed with spouse to continue to keep pt hydrated, if no improvement call EMS - take pt to ED.             Spouse to call [employer] transportation to schedule ride for f/u appointment with Dr. Gwen PoundsKowalski 12/8            Spouse to call the Wound care center, schedule appointment for pt to f/u this week/[facility]             Spouse to continue to give pt oral antibiotic, apply antibiotic ointment to reddened areas on scrotum/groin- if               No improvement, call MD.             RN CM discussed with spouse with pt's frequent  hospitalizations- benefits of Palliative Care to which                  She said would talk to pt about.             RN CM to f/u tomorrow telephonically, check on pt's status.              Plan to fax in Epic  Dr. Judithann SheenSparks- 12/5 encounter.   [facility] CM Care Plan Problem One        Most Recent Value   Care Plan Problem One  Congestive Heart Failure - chronic edema    Role Documenting the Problem One  Care Management Coordinator   Care Plan for Problem One  Active   [facility] Long Term Goal (31-90 days)  Pt would see improvement in edema in the next 31 days    [facility] Long Term Goal Start Date  08/26/15   Interventions for Problem One Long Term Goal  Discussed with spouse with pt unable to weigh (not steady), to monitor for increase in edema in abdomine,lower legs      Tyler Alkenose M.   Pierzchala RN CCM [facility] Care Management  [phone]

## 2015-08-29 ENCOUNTER — Encounter: Payer: Self-pay | Admitting: Emergency Medicine

## 2015-08-29 ENCOUNTER — Inpatient Hospital Stay
Admission: EM | Admit: 2015-08-29 | Discharge: 2015-09-22 | DRG: 871 | Disposition: E | Payer: Commercial Managed Care - HMO | Attending: Internal Medicine | Admitting: Internal Medicine

## 2015-08-29 ENCOUNTER — Emergency Department: Payer: Commercial Managed Care - HMO

## 2015-08-29 DIAGNOSIS — Z794 Long term (current) use of insulin: Secondary | ICD-10-CM | POA: Diagnosis not present

## 2015-08-29 DIAGNOSIS — Z9049 Acquired absence of other specified parts of digestive tract: Secondary | ICD-10-CM

## 2015-08-29 DIAGNOSIS — J441 Chronic obstructive pulmonary disease with (acute) exacerbation: Secondary | ICD-10-CM | POA: Diagnosis present

## 2015-08-29 DIAGNOSIS — I878 Other specified disorders of veins: Secondary | ICD-10-CM | POA: Diagnosis present

## 2015-08-29 DIAGNOSIS — J9621 Acute and chronic respiratory failure with hypoxia: Secondary | ICD-10-CM | POA: Diagnosis present

## 2015-08-29 DIAGNOSIS — I272 Other secondary pulmonary hypertension: Secondary | ICD-10-CM | POA: Diagnosis present

## 2015-08-29 DIAGNOSIS — E1122 Type 2 diabetes mellitus with diabetic chronic kidney disease: Secondary | ICD-10-CM | POA: Diagnosis present

## 2015-08-29 DIAGNOSIS — M109 Gout, unspecified: Secondary | ICD-10-CM | POA: Diagnosis present

## 2015-08-29 DIAGNOSIS — R601 Generalized edema: Secondary | ICD-10-CM

## 2015-08-29 DIAGNOSIS — F17290 Nicotine dependence, other tobacco product, uncomplicated: Secondary | ICD-10-CM | POA: Diagnosis present

## 2015-08-29 DIAGNOSIS — I5033 Acute on chronic diastolic (congestive) heart failure: Secondary | ICD-10-CM | POA: Diagnosis present

## 2015-08-29 DIAGNOSIS — Z7982 Long term (current) use of aspirin: Secondary | ICD-10-CM | POA: Diagnosis not present

## 2015-08-29 DIAGNOSIS — Z823 Family history of stroke: Secondary | ICD-10-CM | POA: Diagnosis not present

## 2015-08-29 DIAGNOSIS — I130 Hypertensive heart and chronic kidney disease with heart failure and stage 1 through stage 4 chronic kidney disease, or unspecified chronic kidney disease: Secondary | ICD-10-CM | POA: Diagnosis present

## 2015-08-29 DIAGNOSIS — Z66 Do not resuscitate: Secondary | ICD-10-CM | POA: Diagnosis present

## 2015-08-29 DIAGNOSIS — I248 Other forms of acute ischemic heart disease: Secondary | ICD-10-CM | POA: Diagnosis present

## 2015-08-29 DIAGNOSIS — D631 Anemia in chronic kidney disease: Secondary | ICD-10-CM | POA: Diagnosis present

## 2015-08-29 DIAGNOSIS — E871 Hypo-osmolality and hyponatremia: Secondary | ICD-10-CM | POA: Diagnosis present

## 2015-08-29 DIAGNOSIS — Y999 Unspecified external cause status: Secondary | ICD-10-CM | POA: Diagnosis not present

## 2015-08-29 DIAGNOSIS — L03115 Cellulitis of right lower limb: Secondary | ICD-10-CM | POA: Diagnosis present

## 2015-08-29 DIAGNOSIS — Z9981 Dependence on supplemental oxygen: Secondary | ICD-10-CM

## 2015-08-29 DIAGNOSIS — Y929 Unspecified place or not applicable: Secondary | ICD-10-CM

## 2015-08-29 DIAGNOSIS — E662 Morbid (severe) obesity with alveolar hypoventilation: Secondary | ICD-10-CM | POA: Diagnosis present

## 2015-08-29 DIAGNOSIS — E079 Disorder of thyroid, unspecified: Secondary | ICD-10-CM | POA: Diagnosis present

## 2015-08-29 DIAGNOSIS — Z86711 Personal history of pulmonary embolism: Secondary | ICD-10-CM | POA: Diagnosis not present

## 2015-08-29 DIAGNOSIS — T68XXXA Hypothermia, initial encounter: Secondary | ICD-10-CM | POA: Diagnosis present

## 2015-08-29 DIAGNOSIS — Z515 Encounter for palliative care: Secondary | ICD-10-CM | POA: Diagnosis present

## 2015-08-29 DIAGNOSIS — E785 Hyperlipidemia, unspecified: Secondary | ICD-10-CM | POA: Diagnosis present

## 2015-08-29 DIAGNOSIS — I480 Paroxysmal atrial fibrillation: Secondary | ICD-10-CM | POA: Diagnosis present

## 2015-08-29 DIAGNOSIS — A419 Sepsis, unspecified organism: Secondary | ICD-10-CM | POA: Diagnosis present

## 2015-08-29 DIAGNOSIS — J45909 Unspecified asthma, uncomplicated: Secondary | ICD-10-CM | POA: Diagnosis present

## 2015-08-29 DIAGNOSIS — Y939 Activity, unspecified: Secondary | ICD-10-CM | POA: Diagnosis not present

## 2015-08-29 DIAGNOSIS — N184 Chronic kidney disease, stage 4 (severe): Secondary | ICD-10-CM | POA: Diagnosis present

## 2015-08-29 DIAGNOSIS — E875 Hyperkalemia: Secondary | ICD-10-CM | POA: Diagnosis present

## 2015-08-29 DIAGNOSIS — N189 Chronic kidney disease, unspecified: Secondary | ICD-10-CM

## 2015-08-29 DIAGNOSIS — R233 Spontaneous ecchymoses: Secondary | ICD-10-CM | POA: Diagnosis present

## 2015-08-29 DIAGNOSIS — X58XXXA Exposure to other specified factors, initial encounter: Secondary | ICD-10-CM | POA: Diagnosis present

## 2015-08-29 DIAGNOSIS — R6521 Severe sepsis with septic shock: Secondary | ICD-10-CM | POA: Diagnosis present

## 2015-08-29 DIAGNOSIS — N179 Acute kidney failure, unspecified: Secondary | ICD-10-CM

## 2015-08-29 DIAGNOSIS — R238 Other skin changes: Secondary | ICD-10-CM

## 2015-08-29 DIAGNOSIS — E039 Hypothyroidism, unspecified: Secondary | ICD-10-CM | POA: Diagnosis present

## 2015-08-29 DIAGNOSIS — E1121 Type 2 diabetes mellitus with diabetic nephropathy: Secondary | ICD-10-CM | POA: Diagnosis present

## 2015-08-29 DIAGNOSIS — Z888 Allergy status to other drugs, medicaments and biological substances status: Secondary | ICD-10-CM

## 2015-08-29 DIAGNOSIS — G9341 Metabolic encephalopathy: Secondary | ICD-10-CM | POA: Diagnosis present

## 2015-08-29 DIAGNOSIS — I4891 Unspecified atrial fibrillation: Secondary | ICD-10-CM | POA: Diagnosis not present

## 2015-08-29 DIAGNOSIS — N19 Unspecified kidney failure: Secondary | ICD-10-CM | POA: Diagnosis not present

## 2015-08-29 DIAGNOSIS — Z833 Family history of diabetes mellitus: Secondary | ICD-10-CM | POA: Diagnosis not present

## 2015-08-29 DIAGNOSIS — R0600 Dyspnea, unspecified: Secondary | ICD-10-CM

## 2015-08-29 DIAGNOSIS — Z86718 Personal history of other venous thrombosis and embolism: Secondary | ICD-10-CM | POA: Diagnosis not present

## 2015-08-29 DIAGNOSIS — E872 Acidosis: Secondary | ICD-10-CM | POA: Diagnosis present

## 2015-08-29 DIAGNOSIS — T148XXA Other injury of unspecified body region, initial encounter: Secondary | ICD-10-CM | POA: Insufficient documentation

## 2015-08-29 LAB — COMPREHENSIVE METABOLIC PANEL
ALBUMIN: 2.3 g/dL — AB (ref 3.5–5.0)
ALT: 11 U/L — ABNORMAL LOW (ref 17–63)
ANION GAP: 10 (ref 5–15)
AST: 28 U/L (ref 15–41)
Alkaline Phosphatase: 173 U/L — ABNORMAL HIGH (ref 38–126)
BILIRUBIN TOTAL: 0.9 mg/dL (ref 0.3–1.2)
BUN: 77 mg/dL — ABNORMAL HIGH (ref 6–20)
CHLORIDE: 95 mmol/L — AB (ref 101–111)
CO2: 24 mmol/L (ref 22–32)
Calcium: 8.4 mg/dL — ABNORMAL LOW (ref 8.9–10.3)
Creatinine, Ser: 2.82 mg/dL — ABNORMAL HIGH (ref 0.61–1.24)
GFR calc Af Amer: 27 mL/min — ABNORMAL LOW (ref >60)
GFR calc non Af Amer: 23 mL/min — ABNORMAL LOW (ref >60)
GLUCOSE: 189 mg/dL — AB (ref 65–99)
POTASSIUM: 5.8 mmol/L — AB (ref 3.5–5.1)
SODIUM: 129 mmol/L — AB (ref 135–145)
TOTAL PROTEIN: 7.2 g/dL (ref 6.5–8.1)

## 2015-08-29 LAB — BLOOD GAS, ARTERIAL
ALLENS TEST (PASS/FAIL): POSITIVE — AB
Acid-base deficit: 1.9 mmol/L (ref 0.0–2.0)
BICARBONATE: 25 meq/L (ref 21.0–28.0)
FIO2: 36
O2 Saturation: 86.2 %
PATIENT TEMPERATURE: 37
PH ART: 7.29 — AB (ref 7.350–7.450)
PO2 ART: 58 mmHg — AB (ref 83.0–108.0)
pCO2 arterial: 52 mmHg — ABNORMAL HIGH (ref 32.0–48.0)

## 2015-08-29 LAB — CBC WITH DIFFERENTIAL/PLATELET
BASOS ABS: 0 10*3/uL (ref 0–0.1)
BASOS PCT: 1 %
EOS PCT: 1 %
Eosinophils Absolute: 0.1 10*3/uL (ref 0–0.7)
HEMATOCRIT: 31.6 % — AB (ref 40.0–52.0)
Hemoglobin: 9.9 g/dL — ABNORMAL LOW (ref 13.0–18.0)
Lymphocytes Relative: 7 %
Lymphs Abs: 0.5 10*3/uL — ABNORMAL LOW (ref 1.0–3.6)
MCH: 26.2 pg (ref 26.0–34.0)
MCHC: 31.3 g/dL — ABNORMAL LOW (ref 32.0–36.0)
MCV: 83.8 fL (ref 80.0–100.0)
MONO ABS: 0.7 10*3/uL (ref 0.2–1.0)
Monocytes Relative: 10 %
NEUTROS ABS: 5.8 10*3/uL (ref 1.4–6.5)
Neutrophils Relative %: 81 %
PLATELETS: 248 10*3/uL (ref 150–440)
RBC: 3.77 MIL/uL — ABNORMAL LOW (ref 4.40–5.90)
RDW: 19.4 % — AB (ref 11.5–14.5)
WBC: 7.1 10*3/uL (ref 3.8–10.6)

## 2015-08-29 LAB — URINALYSIS COMPLETE WITH MICROSCOPIC (ARMC ONLY)
BILIRUBIN URINE: NEGATIVE
GLUCOSE, UA: NEGATIVE mg/dL
Ketones, ur: NEGATIVE mg/dL
LEUKOCYTES UA: NEGATIVE
NITRITE: NEGATIVE
Protein, ur: 100 mg/dL — AB
SPECIFIC GRAVITY, URINE: 1.013 (ref 1.005–1.030)
pH: 5 (ref 5.0–8.0)

## 2015-08-29 LAB — TROPONIN I: Troponin I: <0.03 ng/mL (ref <0.031)

## 2015-08-29 LAB — AMMONIA: Ammonia: 21 umol/L (ref 9–35)

## 2015-08-29 LAB — LACTIC ACID, PLASMA
LACTIC ACID, VENOUS: 2 mmol/L (ref 0.5–2.0)
LACTIC ACID, VENOUS: 1.7 mmol/L (ref 0.5–2.0)

## 2015-08-29 LAB — LIPASE, BLOOD: Lipase: 18 U/L (ref 11–51)

## 2015-08-29 LAB — APTT: APTT: 33 s (ref 24–36)

## 2015-08-29 LAB — PROTIME-INR
INR: 1.14
PROTHROMBIN TIME: 14.8 s (ref 11.4–15.0)

## 2015-08-29 LAB — GLUCOSE, CAPILLARY: GLUCOSE-CAPILLARY: 199 mg/dL — AB (ref 65–99)

## 2015-08-29 MED ORDER — PRAVASTATIN SODIUM 20 MG PO TABS
40.0000 mg | ORAL_TABLET | Freq: Every day | ORAL | Status: DC
  Administered 2015-08-30: 40 mg via ORAL
  Filled 2015-08-29: qty 2

## 2015-08-29 MED ORDER — ONDANSETRON HCL 4 MG PO TABS: 4.0000 mg | ORAL_TABLET | Freq: Four times a day (QID) | ORAL | Status: DC | PRN

## 2015-08-29 MED ORDER — SODIUM BICARBONATE 8.4 % IV SOLN
INTRAVENOUS | Status: DC
  Administered 2015-08-30: 01:00:00 via INTRAVENOUS
  Filled 2015-08-29 (×3): qty 150

## 2015-08-29 MED ORDER — ONDANSETRON HCL 4 MG/2ML IJ SOLN: 4.0000 mg | Freq: Four times a day (QID) | INTRAMUSCULAR | Status: DC | PRN

## 2015-08-29 MED ORDER — IPRATROPIUM-ALBUTEROL 0.5-2.5 (3) MG/3ML IN SOLN
3.0000 mL | Freq: Once | RESPIRATORY_TRACT | Status: AC
  Administered 2015-08-29: 3 mL via RESPIRATORY_TRACT
  Filled 2015-08-29: qty 3

## 2015-08-29 MED ORDER — ACETAMINOPHEN 650 MG RE SUPP: 650.0000 mg | Freq: Four times a day (QID) | RECTAL | Status: DC | PRN

## 2015-08-29 MED ORDER — ONDANSETRON HCL 4 MG/2ML IJ SOLN
4.0000 mg | Freq: Once | INTRAMUSCULAR | Status: AC
  Administered 2015-08-29: 4 mg via INTRAVENOUS
  Filled 2015-08-29: qty 2

## 2015-08-29 MED ORDER — SODIUM CHLORIDE 0.9 % IJ SOLN
3.0000 mL | Freq: Two times a day (BID) | INTRAMUSCULAR | Status: DC
  Administered 2015-08-29 – 2015-09-01 (×4): 3 mL via INTRAVENOUS

## 2015-08-29 MED ORDER — LEVOTHYROXINE SODIUM 50 MCG PO TABS
100.0000 ug | ORAL_TABLET | Freq: Every day | ORAL | Status: DC
  Administered 2015-08-30 – 2015-09-01 (×3): 100 ug via ORAL
  Filled 2015-08-29: qty 2
  Filled 2015-08-29 (×2): qty 1

## 2015-08-29 MED ORDER — METHYLPREDNISOLONE SODIUM SUCC 125 MG IJ SOLR
60.0000 mg | Freq: Four times a day (QID) | INTRAMUSCULAR | Status: DC
  Administered 2015-08-29 – 2015-08-31 (×6): 60 mg via INTRAVENOUS
  Filled 2015-08-29 (×6): qty 2

## 2015-08-29 MED ORDER — INSULIN ASPART 100 UNIT/ML ~~LOC~~ SOLN
0.0000 [IU] | Freq: Every day | SUBCUTANEOUS | Status: DC
  Administered 2015-08-30 – 2015-08-31 (×2): 2 [IU] via SUBCUTANEOUS
  Filled 2015-08-29: qty 5
  Filled 2015-08-29 (×2): qty 2

## 2015-08-29 MED ORDER — VANCOMYCIN HCL 10 G IV SOLR
1750.0000 mg | INTRAVENOUS | Status: DC
  Administered 2015-08-30 – 2015-08-31 (×2): 1750 mg via INTRAVENOUS
  Filled 2015-08-29 (×3): qty 1750

## 2015-08-29 MED ORDER — SODIUM CHLORIDE 0.9 % IV BOLUS (SEPSIS)
500.0000 mL | INTRAVENOUS | Status: AC
  Administered 2015-08-29: 500 mL via INTRAVENOUS

## 2015-08-29 MED ORDER — IPRATROPIUM-ALBUTEROL 0.5-2.5 (3) MG/3ML IN SOLN
3.0000 mL | Freq: Four times a day (QID) | RESPIRATORY_TRACT | Status: DC
  Administered 2015-08-30 – 2015-09-01 (×10): 3 mL via RESPIRATORY_TRACT
  Filled 2015-08-29 (×10): qty 3

## 2015-08-29 MED ORDER — PIPERACILLIN-TAZOBACTAM 4.5 G IVPB
4.5000 g | Freq: Three times a day (TID) | INTRAVENOUS | Status: DC
  Filled 2015-08-29 (×3): qty 100

## 2015-08-29 MED ORDER — POLYETHYLENE GLYCOL 3350 17 G PO PACK
17.0000 g | PACK | Freq: Every day | ORAL | Status: DC | PRN
  Administered 2015-09-01: 08:00:00 17 g via ORAL
  Filled 2015-08-29: qty 1

## 2015-08-29 MED ORDER — VANCOMYCIN HCL IN DEXTROSE 1-5 GM/200ML-% IV SOLN
1000.0000 mg | Freq: Once | INTRAVENOUS | Status: AC
  Administered 2015-08-29: 1000 mg via INTRAVENOUS
  Filled 2015-08-29: qty 200

## 2015-08-29 MED ORDER — VANCOMYCIN HCL IN DEXTROSE 1-5 GM/200ML-% IV SOLN
1000.0000 mg | INTRAVENOUS | Status: AC
  Administered 2015-08-29: 1000 mg via INTRAVENOUS
  Filled 2015-08-29: qty 200

## 2015-08-29 MED ORDER — INSULIN ASPART 100 UNIT/ML ~~LOC~~ SOLN
0.0000 [IU] | Freq: Three times a day (TID) | SUBCUTANEOUS | Status: DC
  Administered 2015-08-30 – 2015-08-31 (×5): 5 [IU] via SUBCUTANEOUS
  Administered 2015-08-31 – 2015-09-01 (×3): 8 [IU] via SUBCUTANEOUS
  Filled 2015-08-29: qty 5
  Filled 2015-08-29: qty 8
  Filled 2015-08-29 (×2): qty 5
  Filled 2015-08-29 (×2): qty 8

## 2015-08-29 MED ORDER — SODIUM CHLORIDE 0.9 % IV BOLUS (SEPSIS)
500.0000 mL | INTRAVENOUS | Status: AC
Start: 2015-08-29 — End: 2015-08-29
  Administered 2015-08-29: 500 mL via INTRAVENOUS

## 2015-08-29 MED ORDER — PIPERACILLIN-TAZOBACTAM 3.375 G IVPB 30 MIN
3.3750 g | Freq: Once | INTRAVENOUS | Status: AC
  Administered 2015-08-29: 3.375 g via INTRAVENOUS
  Filled 2015-08-29: qty 50

## 2015-08-29 MED ORDER — ALUM & MAG HYDROXIDE-SIMETH 200-200-20 MG/5ML PO SUSP: 30.0000 mL | ORAL | Status: DC | PRN

## 2015-08-29 MED ORDER — HEPARIN SODIUM (PORCINE) 5000 UNIT/ML IJ SOLN
5000.0000 [IU] | Freq: Three times a day (TID) | INTRAMUSCULAR | Status: DC
  Administered 2015-08-29 – 2015-09-01 (×7): 5000 [IU] via SUBCUTANEOUS
  Filled 2015-08-29 (×8): qty 1

## 2015-08-29 MED ORDER — ASPIRIN EC 81 MG PO TBEC
81.0000 mg | DELAYED_RELEASE_TABLET | Freq: Every day | ORAL | Status: DC
  Administered 2015-08-30 – 2015-09-01 (×3): 81 mg via ORAL
  Filled 2015-08-29 (×3): qty 1

## 2015-08-29 MED ORDER — MORPHINE SULFATE (PF) 4 MG/ML IV SOLN
4.0000 mg | Freq: Once | INTRAVENOUS | Status: AC
  Administered 2015-08-29: 4 mg via INTRAVENOUS
  Filled 2015-08-29: qty 1

## 2015-08-29 MED ORDER — SODIUM BICARBONATE 8.4 % IV SOLN: INTRAVENOUS | Status: DC

## 2015-08-29 MED ORDER — PIPERACILLIN-TAZOBACTAM 4.5 G IVPB
4.5000 g | Freq: Three times a day (TID) | INTRAVENOUS | Status: DC
  Administered 2015-08-30 – 2015-09-01 (×7): 4.5 g via INTRAVENOUS
  Filled 2015-08-29 (×10): qty 100

## 2015-08-29 MED ORDER — SODIUM CHLORIDE 0.9 % IV SOLN: 1750.0000 mg | INTRAVENOUS | Status: DC

## 2015-08-29 MED ORDER — ACETAMINOPHEN 325 MG PO TABS
650.0000 mg | ORAL_TABLET | Freq: Four times a day (QID) | ORAL | Status: DC | PRN
  Administered 2015-08-30: 650 mg via ORAL
  Filled 2015-08-29: qty 2

## 2015-08-29 MED ORDER — TAMSULOSIN HCL 0.4 MG PO CAPS
0.4000 mg | ORAL_CAPSULE | Freq: Every day | ORAL | Status: DC
  Administered 2015-08-30 – 2015-09-01 (×3): 0.4 mg via ORAL
  Filled 2015-08-29 (×3): qty 1

## 2015-08-29 NOTE — ED Notes (Signed)
Patient transported to CT 

## 2015-08-29 NOTE — ED Notes (Signed)
Transported back to CT. Pt given meds. Was unable to tolerate being flat prior to meds. Will try again.

## 2015-08-29 NOTE — ED Notes (Addendum)
Wound RN came to house to look at RLE wound and called wife reporting pt was disoriented and weak. When EMS arrived pt had syncopal episode.  pts wife also reports wound to scrotum and buttocks. Has also had weakness.

## 2015-08-29 NOTE — ED Notes (Signed)
Multiple staff at bedside to assist with turning patient.   Open wound noted to scrotum that involves right upper thigh.   Weeping purulent drainage,   There is also blood tinged drainage noted on the sheet.  Pt also has a wound with scab to right anterior thigh, and a wound to right heel.   Pt was repositioned on stretcher on his left side and pillows placed for comfort under feet, and under the left side.

## 2015-08-29 NOTE — ED Notes (Signed)
Has been more weak than normal X 1 week. Syncope today with EMS. Wife denies other episodes of syncope.

## 2015-08-29 NOTE — Progress Notes (Signed)
eLink Physician-Brief Progress Note Patient Name: Tyler AusMichael A [name] DOB: Jan 04, 1956 MRN: [id number]   Date of Service  08/23/2015  HPI/Events of Note  Morbidly obese with probable sepsis /ari ? pna ? cellulitis  eICU Interventions  Responding to bipap/ agree with present rx including vanc/zosyn/ no interventions needed      Intervention Category Major Interventions: Sepsis - evaluation and management  Sandrea HughsMichael Philip Kotlyar 08/28/2015, 11:02 PM

## 2015-08-29 NOTE — Progress Notes (Signed)
[facility]  ROUNDING NOTE   Subjective:   Patient well known to our practice with chronic kidney disease stage IV with baseline creatinine of 1.6-1.8 with chronic peripheral edema/anasarca, morbid obesity and diabetic nephropathy  [facility] with worsening mental status, shortness of breath, and weakness.   Found to be in Pickwickian with ABG 50-50 metabolic acidosis. Sodium 129, potassium 5.8, creatinine 2.82. Placed on bicarb gtt and BiPap. Albuterol, insulin ordered.   Gluteal and right heel wound have developed in recent weeks.   Objective:  Vital signs in last 24 hours:  Temp:  [95.8 F (35.4 C)-97 F (36.1 C)] 97 F (36.1 C) (12/08 2018) Pulse Rate:  [73-81] 75 (12/08 2018) Resp:  [11-20] 18 (12/08 2018) BP: (95-122)/(62-84) 108/70 mmHg (12/08 2018) SpO2:  [84 %-100 %] 100 % (12/08 2018) FiO2 (%):  [50 %-55 %] 50 % (12/08 2018) Weight:  [171.6 kg (378 lb 5 oz)] 171.6 kg (378 lb 5 oz) (12/08 1525)  Weight change:  Filed Weights   11-16-2014 1525  Weight: 171.6 kg (378 lb 5 oz)    Intake/Output:     Intake/Output this shift:     Physical Exam: General: Critically ill  Head: BiPap  Eyes: Eyes open, PERRL  Neck: Supple, trachea midline  Lungs:  BIPAP 16/8 FiO2 50% bilateral wheezes  Heart: Regular rate and rhythm  Abdomen:  Soft, nontender, obese, abdominal wall edema  Extremities: 3+ peripheral edema.  Neurologic: Not answering commands, somnulent  Skin: Erythema on lower extremities, right heel wound  GU Scrotal edema    Basic Metabolic Panel:  Recent Labs Lab 11-16-2014 1632  NA 129*  K 5.8*  CL 95*  CO2 24  GLUCOSE 189*  BUN 77*  CREATININE 2.82*  CALCIUM 8.4*    Liver Function Tests:  Recent Labs Lab 11-16-2014 1632  AST 28  ALT 11*  ALKPHOS 173*  BILITOT 0.9  PROT 7.2  ALBUMIN 2.3*    Recent Labs Lab 11-16-2014 1632  LIPASE 18    Recent Labs Lab 11-16-2014 1609  AMMONIA 21    CBC:  Recent Labs Lab  11-16-2014 1632  WBC 7.1  NEUTROABS 5.8  HGB 9.9*  HCT 31.6*  MCV 83.8  PLT 248    Cardiac Enzymes:  Recent Labs Lab 11-16-2014 1632  TROPONINI <0.03    BNP: Invalid input(s): POCBNP  CBG: No results for input(s): GLUCAP in the last 168 hours.  Microbiology: Results for orders placed or performed in visit on 08/21/14  ED Influenza     Status: None   Collection Time: 08/21/14  4:53 PM  Result Value Ref Range Status   Influenza A By PCR NEGATIVE NEGATIVE Final   Influenza B By PCR NEGATIVE NEGATIVE Final   H1N1 flu by pcr NOT DETECTED NOT-DETECTED Final    Comment:                  ----------------------- The Xpert Flu assay (FDA approval for nasal aspirates or washes and nasopharyngeal swab specimens), is intended as an aid in the diagnosis of influenza and should not be used as a sole basis for treatment.     Coagulation Studies:  Recent Labs  11-16-2014 1632  LABPROT 14.8  INR 1.14    Urinalysis:  Recent Labs  11-16-2014 1645  COLORURINE YELLOW*  LABSPEC 1.013  PHURINE 5.0  GLUCOSEU NEGATIVE  HGBUR 1+*  BILIRUBINUR NEGATIVE  KETONESUR NEGATIVE  PROTEINUR 100*  NITRITE NEGATIVE  LEUKOCYTESUR NEGATIVE  Imaging: Dg Chest Portable 1 View  08/31/2015  CLINICAL DATA:  Shortness of Breath, weakness EXAM: PORTABLE CHEST 1 VIEW COMPARISON:  10/20/ 16 FINDINGS: Cardiomegaly again noted. Persistent mild congestion/ pulmonary edema. Again noted small left pleural effusion left basilar atelectasis or infiltrate. IMPRESSION: Persistent mild congestion/ pulmonary edema. Small left pleural effusion left basilar atelectasis or infiltrate. Electronically Signed   By: Natasha Mead M.D.   On: 09/07/2015 16:03     Medications:   .  sodium bicarbonate  infusion 1000 mL     . [START ON 08/30/2015] insulin aspart  0-15 Units Subcutaneous TID WC  . insulin aspart  0-5 Units Subcutaneous QHS     Assessment/ Plan:  [name] is a 59 y.o. white  male  with bilateral pulmonary emboli, DVTs, hypercoagulable disorder, varicose veins, morbid obesity,  chronic congestive heart failure, obstructive sleep apnea, diabetes type 2 with nephropathy retinopathy, neuropathy, atrial fibrillation, chronic lower back pain, admitted on 08/25/2015 for syncope  1. Acute renal failure on chronic kidney disease stage III with hyperkalemia and metabolic acidosis: Potassium can be related to patient's spironolactone. Patient's acute renal failure can be due to acute cardiorenal syndrome. Patient is clearly off his Shanon Payor curve. Hypotensive with poor renal perfusion.  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  - Agree with potassium shifting. Bicarb gtt. Acidosis is a mixed picture.  - monitor electrolytes closely. Patient has low threshold for dialysis but unclear if patient would tolerate dialysis in his critical condition.   2. Acute Respiratory Failure: acute exacerbation of COPD, obstructive sleep apnea/Pickwickian disorder, and acute exacerbation of diastolic congestive heart failure. Patient is fluid overloaded on top of his chronic lymphedema and anasarca  - Placed on BiPap. Due to being a CO2 retainer, would not recommend intubation at this point.  - Steroids, empiric antibiotics, nebs and pulmonary consult.  - for congestive heart failure, if diuretics are desired, consider inotropes as well.   3. Hyponatremia: due to volume overload and congestive heart failure.   4. Anemia of chronic kidney disease: hemoglobin 9.9. No epo in the past.     LOS: 0 Tyler [name] 12/8/20169:12 PM

## 2015-08-29 NOTE — ED Notes (Signed)
Admitting physician at bedside,   Dr Clent RidgesWalsh

## 2015-08-29 NOTE — ED Notes (Signed)
RT at bedside for bipap.

## 2015-08-29 NOTE — Progress Notes (Signed)
ANTIBIOTIC CONSULT NOTE - INITIAL  Pharmacy Consult for Vancomycin/Zosyn  Indication: rule out sepsis  Allergies  Allergen Reactions  . Enalapril Cough  . Atorvastatin Palpitations  . Lovastatin Palpitations    Patient Measurements: Height: 6' (182.9 cm) Weight: (!) 378 lb 5 oz (171.6 kg) IBW/kg (Calculated) : 77.6 Adjusted Body Weight: 115.2 kg   Vital Signs: Temp: 98.4 F (36.9 C) (12/08 2150) Temp Source: Axillary (12/08 2150) BP: 95/64 mmHg (12/08 2131) Pulse Rate: 78 (12/08 2150) Intake/Output from previous day:   Intake/Output from this shift:    Labs:  Recent Labs  09/06/2015 1632  WBC 7.1  HGB 9.9*  PLT 248  CREATININE 2.82*   Estimated Creatinine Clearance: 46 mL/min (by C-G formula based on Cr of 2.82). No results for input(s): VANCOTROUGH, VANCOPEAK, VANCORANDOM, GENTTROUGH, GENTPEAK, GENTRANDOM, TOBRATROUGH, TOBRAPEAK, TOBRARND, AMIKACINPEAK, AMIKACINTROU, AMIKACIN in the last 72 hours.   Microbiology: No results found for this or any previous visit (from the past 720 hour(s)).  Medical History: Past Medical History  Diagnosis Date  . Cataract   . CHF (congestive heart failure) (HCC)   . COPD (chronic obstructive pulmonary disease) (HCC)   . Diabetes mellitus without complication (HCC)   . Sleep apnea   . Oxygen deficiency   . Thyroid disease   . Gout   . Hypothyroidism   . CKD (chronic kidney disease) stage 3, GFR 30-59 ml/min   . Asthma   . Hypertension     Medications:   (Not in a hospital admission) Assessment: Pharmacy consulted to dose Vanc and Zosyn in this 59 year old male admitted with sepsis.  CrCl = 46 ml/min Ke = 0.04 hr-1 T1/2 = 17.3 hrs Vd = 80.6 L  Goal of Therapy:  Vancomycin trough level 15-20 mcg/ml  Plan:  Expected duration 7 days with resolution of temperature and/or normalization of WBC   Zosyn 3.375 gm IV X 1 given in ED on 12/08 @ 17:00. Zosyn 4.5 gm IV Q8H EI ordered to start 12/09 @ 3:00.  Vancomycin  1 gm IV X 1 given in ED on 12/08 @ 17:00. Vancomycin 1 gm IV X 1 ordered STAT on 12/08 @ around 22:00 to make total starting dose of 2 gm. Vancomycin 1750 mg IV Q24H ordered to start 12/09 @ 7:00, ~ 14 hrs after 1st dose (stacked dosing). Will draw first trough on 12/11 @ 6:30 to rule out accumulation, this will not be at [facility].   Arina Torry D 09/17/2015,10:06 PM

## 2015-08-29 NOTE — ED Notes (Signed)
petechiae noted to right shoulder. New over last week per wife.

## 2015-08-29 NOTE — ED Provider Notes (Signed)
[facility] Provider Note  ____________________________________________  Time seen: Approximately 4:22 PM  I have reviewed the triage vital signs and the nursing notes.   HISTORY  Chief Complaint Weakness  Patient is a vague historian and is somewhat altered/delirious  HPI EGAN BERKHEIMER is a 59 y.o. male with an extensive past medical history that includes COPD on 2-1/2 L of oxygen at baseline, morbid obesity, and CHF who his primary care doctor is Dr. Judithann Sheen.  He presents by EMS today for gradual onset weakness for more than a week, syncopal episode today, and altered status.  His wife states that his already poor health has been deteriorating for more than a week.  As of the last 2 days he has been unable to stand on his own.  For slightly more than a week he has been developing wounds on his scrotum as well as on his buttocks.  Wound care nurse came out to his house today and recommended that they go to the emergency department for his confusion, disorientation, weakness, and overall status.  His symptoms are described as severe at this time with a gradual onset.  He denies chest pain and has had increased shortness of breath and they recently had to turn up his oxygen to 5 L to maintain his oxygen saturation at home.  He is up more than 10 pounds compared to what they thought it was.  His blood pressure is consistently low in the emergency department   Past Medical History  Diagnosis Date  . Cataract   . CHF (congestive heart failure) (HCC)   . COPD (chronic obstructive pulmonary disease) (HCC)   . Diabetes mellitus without complication (HCC)   . Sleep apnea   . Oxygen deficiency   . Thyroid disease   . Gout   . Hypothyroidism   . CKD (chronic kidney disease) stage 3, GFR 30-59 ml/min   . Asthma   . Hypertension     Patient Active Problem List   Diagnosis Date Noted  . Sepsis (HCC) 09/03/2015  . Morbid obesity (HCC)  07/17/2015  . Left leg cellulitis 07/11/2015  . Pressure ulcer 07/02/2015  . Cellulitis of leg, left 07/01/2015  . Acute on chronic renal failure (HCC) 07/01/2015  . Hyperkalemia 07/01/2015  . Cellulitis 07/01/2015  . Type 2 diabetes mellitus (HCC) 05/27/2015  . Hypothyroidism 05/27/2015  . HLD (hyperlipidemia) 05/27/2015  . Acute on chronic diastolic congestive heart failure (HCC)   . Depression, major, recurrent, moderate (HCC) 04/12/2015  . CHF (congestive heart failure) (HCC) 04/10/2015  . Anasarca 04/09/2015  . COPD, moderate (HCC) 03/12/2015  . Tobacco abuse 03/12/2015  . Acute CHF (HCC) 02/16/2015  . Hyponatremia 02/16/2015  . A-fib (HCC) 02/16/2015  . Chronic respiratory failure (HCC) 02/16/2015    Past Surgical History  Procedure Laterality Date  . Cholecystectomy      2002  . Eye surgery      right cataract removed 3/21  . Vena cava filter placement      No current outpatient prescriptions on file.  Allergies Enalapril; Atorvastatin; and Lovastatin  Family History  Problem Relation Age of Onset  . Diabetes Sister   . Diabetes Brother   . Deep vein thrombosis Mother   . CVA Father     Social History Social History  Substance Use Topics  . Smoking status: Current Some Day Smoker -- 0.00 packs/day for 30 years    Types: Cigars  . Smokeless tobacco: Never Used  Comment: cigar 3-4 yrs 1-3 per day/former Cigarettes  . Alcohol Use: No    Review of Systems Constitutional: Subjective fever Eyes: No visual changes. ENT: No sore throat. Cardiovascular: Denies chest pain. Respiratory: Increasing shortness of breath over the last few days Gastrointestinal: Vague generalized abdominal pain with nausea, no vomiting, no diarrhea Genitourinary: Decreased urine output.  Developing wounds on his scrotum Musculoskeletal: Negative for back pain. Skin: Negative for rash. Neurological: Negative for headaches, focal weakness or numbness.  10-point ROS otherwise  negative.  ____________________________________________   PHYSICAL EXAM:  VITAL SIGNS: ED Triage Vitals  Enc Vitals Group     BP 09/04/2015 1525 107/69 mmHg     Pulse Rate 08/23/2015 1525 74     Resp 08/31/2015 1525 20     Temp --      Temp src --      SpO2 08/31/2015 1525 94 %     Weight 09/09/2015 1525 378 lb 5 oz (171.6 kg)     Height 09/13/2015 1525 6' (1.829 m)     Head Cir --      Peak Flow --      Pain Score --      Pain Loc --      Pain Edu? --      Excl. in GC? --     Constitutional: Ill-appearing, somewhat disoriented. Pallid.  Anasarca Eyes:  PERRL. EOMI. some scleral icterus Head: Atraumatic. Nose: No congestion/rhinnorhea. Mouth/Throat: Mucous membranes are dry.  Oropharynx non-erythematous. Neck: No stridor.   Cardiovascular: Normal rate, regular rhythm. Grossly normal heart sounds.  Good peripheral circulation. Respiratory: Normal respiratory effort.  No retractions. Lungs CTAB. Gastrointestinal: Super morbid obesity also consistent edema/anasarca.  No specific tenderness to palpation.  No peritonitis. Genitourinary: Wounds along the posterior scrotum of various depths appear pressure related but with excoriation.  tender to palpation.  I do not appreciate crepitus but the exam is limited due to his habitus and his lack of cooperation because changing position is very uncomfortable. Musculoskeletal: No lower extremity tenderness nor edema.  No joint effusions. Neurologic:  Normal speech and language. No gross focal neurologic deficits are appreciated.  Skin:  Skin is pallid.  Pressure wounds on his buttocks and scrotum as described above.  He appears slightly jaundiced but the patient's spouse declines any liver history.  ____________________________________________   LABS (all labs ordered are listed, but only abnormal results are displayed)  Labs Reviewed  COMPREHENSIVE METABOLIC PANEL - Abnormal; Notable for the following:    Sodium 129 (*)    Potassium 5.8 (*)     Chloride 95 (*)    Glucose, Bld 189 (*)    BUN 77 (*)    Creatinine, Ser 2.82 (*)    Calcium 8.4 (*)    Albumin 2.3 (*)    ALT 11 (*)    Alkaline Phosphatase 173 (*)    GFR calc non Af Amer 23 (*)    GFR calc Af Amer 27 (*)    All other components within normal limits  CBC WITH DIFFERENTIAL/PLATELET - Abnormal; Notable for the following:    RBC 3.77 (*)    Hemoglobin 9.9 (*)    HCT 31.6 (*)    MCHC 31.3 (*)    RDW 19.4 (*)    Lymphs Abs 0.5 (*)    All other components within normal limits  URINALYSIS COMPLETEWITH MICROSCOPIC ([facility] ONLY) - Abnormal; Notable for the following:    Color, Urine YELLOW (*)    APPearance CLOUDY (*)  Hgb urine dipstick 1+ (*)    Protein, ur 100 (*)    Bacteria, UA MANY (*)    Squamous Epithelial / LPF 0-5 (*)    All other components within normal limits  BLOOD GAS, ARTERIAL - Abnormal; Notable for the following:    pH, Arterial 7.29 (*)    pCO2 arterial 52 (*)    pO2, Arterial 58 (*)    Allens test (pass/fail) POSITIVE (*)    All other components within normal limits  HEMOGLOBIN A1C - Abnormal; Notable for the following:    Hgb A1c MFr Bld 7.2 (*)    All other components within normal limits  BASIC METABOLIC PANEL - Abnormal; Notable for the following:    Sodium 132 (*)    Potassium 5.9 (*)    Chloride 99 (*)    Glucose, Bld 194 (*)    BUN 81 (*)    Creatinine, Ser 2.86 (*)    Calcium 8.3 (*)    GFR calc non Af Amer 23 (*)    GFR calc Af Amer 26 (*)    All other components within normal limits  TROPONIN I - Abnormal; Notable for the following:    Troponin I 0.04 (*)    All other components within normal limits  GLUCOSE, CAPILLARY - Abnormal; Notable for the following:    Glucose-Capillary 199 (*)    All other components within normal limits  MRSA PCR SCREENING  CULTURE, BLOOD (ROUTINE X 2)  CULTURE, BLOOD (ROUTINE X 2)  URINE CULTURE  WOUND CULTURE  LIPASE, BLOOD  AMMONIA  LACTIC ACID, PLASMA  LACTIC ACID, PLASMA  TROPONIN  I  APTT  PROTIME-INR  BASIC METABOLIC PANEL  CBC  TROPONIN I  TROPONIN I  CBG MONITORING, ED   ____________________________________________  EKG  ED ECG REPORT I, Aryahi Denzler, the attending physician, personally viewed and interpreted this ECG.   Date: 08/30/2015  EKG Time: 15:27  Rate: 78  Rhythm: atrial fibrillation, rate 78  Axis: Normal  Intervals: Atrial fibrillation, otherwise intervals are unremarkable  ST&T Change: Non-specific ST segment / T-wave changes, but no evidence of acute ischemia.   ____________________________________________  RADIOLOGY   Dg Chest Portable 1 View  09/14/2015  CLINICAL DATA:  Shortness of Breath, weakness EXAM: PORTABLE CHEST 1 VIEW COMPARISON:  10/20/ 16 FINDINGS: Cardiomegaly again noted. Persistent mild congestion/ pulmonary edema. Again noted small left pleural effusion left basilar atelectasis or infiltrate. IMPRESSION: Persistent mild congestion/ pulmonary edema. Small left pleural effusion left basilar atelectasis or infiltrate. Electronically Signed   By: Natasha Mead M.D.   On: 08/28/2015 16:03    ____________________________________________   PROCEDURES  Procedure(s) performed: None  Critical Care performed: Yes, see critical care note(s)   CRITICAL CARE Performed by: Loleta Rose  Total critical care time: 45 minutes  Critical care time was exclusive of separately billable procedures and treating other patients.  Critical care was necessary to treat or prevent imminent or life-threatening deterioration.  Critical care was time spent personally by me on the following activities: development of treatment plan with patient and/or surrogate as well as nursing, discussions with consultants, evaluation of patient's response to treatment, examination of patient, obtaining history from patient or surrogate, ordering and performing treatments and interventions, ordering and review of laboratory studies, ordering and review of  radiographic studies, pulse oximetry and re-evaluation of patient's condition.   ____________________________________________   INITIAL IMPRESSION / ASSESSMENT AND PLAN / ED COURSE  Pertinent labs & imaging results that were available  during my care of the patient were reviewed by me and considered in my medical decision making (see chart for details).  Initially the patient did not meet sepsis criteria based on his vital signs alone, but when I evaluated the patient and then we collaboratively performed a GU exam including a rectal temp which was 95.8, I escalated to full sepsis protocol coding empiric antibiotics.  Given his degree of pitting edema and CHF I will still be gentle on the fluids until his lactate comes back but I will given empiric antibiotics.  I am also ordering a noncontrast CT of his abdomen and pelvis to evaluate for possible Fournier's gangrene; I am concerned about renal failure given his decreased urine output for several days and I do not want to wait for his metabolic panel if this could possibly be a necrotizing infection.   (Note that documentation was delayed due to multiple ED patients requiring immediate care.)   The patient's girth is too great to fit into the CT scanner.  Fortunately, he has no leukocytosis and a normal lactate, which is reassuring.  Furthermore, his scrotum and buttocks wounds are more consistent with pressure ulcers rather than gangrene.  His respiratory status necessitated Bipap for oxygenation support (not ventilation), which also prevented him from going to Ultrasound of his perineum and scrotum.  However, with all the data back, I believe Fournier's is very unlikely and that he needs admission to the ICU for his acute on chronic respiratory failure and his acute on chronic renal failure.  He is tolerating the bipap well.  Of note, more than an hour after admitting the patient to Dr. Clent RidgesWalsh, she approached me after she had a phone conversation  with Dr. Star AgeMuengel the intensivist.  Dr. Star AgeMuengel suggested that we intubate the patient while in the ED.  However, I declined to do so because at this time he does not meet any of my criteria for intubation:  He is protecting his airway, his breathing and ventilation are appropriate, his mental status is stable, he is tolerating the bipap well.  Additionally, given the patient's morbid obesity and resultant restrictive airway disease and his known severe obstructive disease (COPD), I am concerned that the patient will be exceptionally hard to wean off the ventilator.  Both I and Dr. Clent RidgesWalsh had an end of life / DNR/DNI discussion with the patient and his wife, and they are not ready to make that move, and he remains a full code.  But currently it is my opinion that invasive airway management is not indicated and may in fact be detrimental to the patient.  Dr. Wynelle LinkKolluru (nephrology) also came personally to the ED to evaluate the patient for his AoCRF, and he knows the patient from a prior admission.  He, too, agrees that intubation is not indicated at this time.  Though it is possible his status may deteriorate while in the ICU, currently the risks (both short and long term) outweigh the possible benefits, and I defer to the ICU's continued management but will not intubate the patient in the Emergency Department.  ____________________________________________  FINAL CLINICAL IMPRESSION(S) / ED DIAGNOSES  Final diagnoses:  Acute on chronic renal failure (HCC)  Anasarca  Sepsis, due to unspecified organism (HCC)  Hypothermia, initial encounter  Multiple wounds of skin  Morbid obesity, unspecified obesity type (HCC)  Acute on chronic respiratory failure with hypoxia (HCC)      NEW MEDICATIONS STARTED DURING THIS VISIT:  Current Discharge Medication List  Loleta Rose, MD 08/30/15 [phone]

## 2015-08-29 NOTE — ED Notes (Signed)
Dr York Ceriseforbach aware of sats

## 2015-08-29 NOTE — ED Notes (Signed)
Tried to call report to CCU,   RN requested 10 more minutes before taking report

## 2015-08-29 NOTE — H&P (Addendum)
[facility] [place] at [facility]   PATIENT NAME: Tyler [name]    MR#:  [id number]  DATE OF BIRTH:  Jan 31, 1956  DATE OF ADMISSION:  09/19/2015  PRIMARY CARE PHYSICIAN: Marguarite Arbour, MD   REQUESTING/REFERRING PHYSICIAN: Dr. Jeanene Erb  CHIEF COMPLAINT:   Chief Complaint  Patient presents with  . Weakness    HISTORY OF PRESENT ILLNESS:  Tyler [name]  is a 59 y.o. male with a known history of COPD on 2.5 L, NYHA class III chronic diastolic heart failure, pulmonary hypertension, obstructive sleep apnea intolerant of CPAP, history of DVTs and PEs, CK D3 and recent nonhealing wounds of the lower extremities presents today from home when the wound care nurse noted him to be more confused and weak than usual. Per his wife he fell twice on Monday. At home he is minimally active, sits in a chair all day, for the past 2 days he has been unable to stand. For the past 2 weeks he has been developing worsening wounds on his bottom and scrotum. He has been confused. He has been short of breath requiring his oxygen to be increased to 5 L to maintain sats over 90%. He has gained at least 10 pounds. On presentation to the emergency room he is hypothermic, hypoxic, in renal and respiratory failure.  PAST MEDICAL HISTORY:   Past Medical History  Diagnosis Date  . Cataract   . CHF (congestive heart failure) (HCC)   . COPD (chronic obstructive pulmonary disease) (HCC)   . Diabetes mellitus without complication (HCC)   . Sleep apnea   . Oxygen deficiency   . Thyroid disease   . Gout   . Hypothyroidism   . CKD (chronic kidney disease) stage 3, GFR 30-59 ml/min   . Asthma   . Hypertension     PAST SURGICAL HISTORY:   Past Surgical History  Procedure Laterality Date  . Cholecystectomy      2002  . Eye surgery      right cataract removed 3/21  . Vena cava filter placement      SOCIAL HISTORY:   Social History  Substance Use Topics  . Smoking status:  Current Some Day Smoker -- 0.00 packs/day for 30 years    Types: Cigars  . Smokeless tobacco: Never Used     Comment: cigar 3-4 yrs 1-3 per day/former Cigarettes  . Alcohol Use: No    FAMILY HISTORY:   Family History  Problem Relation Age of Onset  . Diabetes Sister   . Diabetes Brother   . Deep vein thrombosis Mother   . CVA Father     DRUG ALLERGIES:   Allergies  Allergen Reactions  . Enalapril Cough  . Atorvastatin Palpitations  . Lovastatin Palpitations    REVIEW OF SYSTEMS:   ROS Unable to obtain due to patient's altered mental status and BiPAP  MEDICATIONS AT HOME:   Prior to Admission medications   Medication Sig Start Date End Date Taking? Authorizing Provider  acetaminophen (TYLENOL) 500 MG tablet Take 1,000 mg by mouth every 6 (six) hours as needed for mild pain or headache.   Yes Historical Provider, MD  ammonium lactate (LAC-HYDRIN) 12 % lotion Apply topically as needed for dry skin. Patient taking differently: Apply 1 application topically as needed for dry skin.  07/05/15  Yes Marguarite Arbour, MD  aspirin EC 81 MG EC tablet Take 1 tablet (81 mg total) by mouth daily. 07/17/15  Yes Marguarite Arbour, MD  cefUROXime (  CEFTIN) 500 MG tablet Take 500 mg by mouth 2 (two) times daily with a meal. For 10 days 08/21/15  Yes Historical Provider, MD  colchicine 0.6 MG tablet Take 0.6 mg by mouth daily as needed (for gout flares).    Yes Historical Provider, MD  cyanocobalamin (,VITAMIN B-12,) 1000 MCG/ML injection Inject 1,000 mcg into the muscle every 30 (thirty) days.   Yes Historical Provider, MD  cyanocobalamin 2000 MCG tablet Take 2,000 mcg by mouth daily.   Yes Historical Provider, MD  docusate sodium (COLACE) 100 MG capsule Take 1 capsule (100 mg total) by mouth 2 (two) times daily. Patient taking differently: Take 100 mg by mouth daily.  07/17/15  Yes Marguarite Arbour, MD  fluticasone (FLONASE) 50 MCG/ACT nasal spray Place 2 sprays into both nostrils daily as  needed for rhinitis.   Yes Historical Provider, MD  furosemide (LASIX) 40 MG tablet Take 1 tablet (40 mg total) by mouth 2 (two) times daily. 07/17/15  Yes Marguarite Arbour, MD  gabapentin (NEURONTIN) 100 MG capsule Take 100 mg by mouth 2 (two) times daily.   Yes Historical Provider, MD  hydrOXYzine (ATARAX/VISTARIL) 25 MG tablet Take 25-50 mg by mouth 3 (three) times daily as needed for itching.   Yes Historical Provider, MD  insulin NPH Human (HUMULIN N,NOVOLIN N) 100 UNIT/ML injection Inject into the skin as needed (for high blood sugar). Pt states that he uses per sliding scale.   Yes Historical Provider, MD  insulin regular (NOVOLIN R,HUMULIN R) 100 units/mL injection Inject into the skin as needed for high blood sugar. Pt states that he uses per sliding scale.   Yes Historical Provider, MD  ipratropium-albuterol (DUONEB) 0.5-2.5 (3) MG/3ML SOLN Take 3 mLs by nebulization 2 (two) times daily.   Yes Historical Provider, MD  lactulose (CHRONULAC) 10 GM/15ML solution Take 20 g by mouth 2 (two) times daily as needed for mild constipation.    Yes Historical Provider, MD  levothyroxine (SYNTHROID, LEVOTHROID) 100 MCG tablet Take 100 mcg by mouth daily.   Yes Historical Provider, MD  ondansetron (ZOFRAN-ODT) 4 MG disintegrating tablet Take 4 mg by mouth every 8 (eight) hours as needed for nausea or vomiting.   Yes Historical Provider, MD  PARoxetine (PAXIL) 20 MG tablet Take 20 mg by mouth daily.   Yes Historical Provider, MD  polyethylene glycol (MIRALAX / GLYCOLAX) packet Take 17 g by mouth daily as needed for mild constipation.    Yes Historical Provider, MD  pravastatin (PRAVACHOL) 40 MG tablet Take 40 mg by mouth at bedtime.   Yes Historical Provider, MD  salmeterol (SEREVENT) 50 MCG/DOSE diskus inhaler Inhale 1 puff into the lungs 2 (two) times daily as needed (for shortness of breath).   Yes Historical Provider, MD  spironolactone (ALDACTONE) 25 MG tablet Take 25 mg by mouth 2 (two) times daily.    Yes Historical Provider, MD  tamsulosin (FLOMAX) 0.4 MG CAPS capsule Take 1 capsule (0.4 mg total) by mouth daily. 04/19/15  Yes Marguarite Arbour, MD  albuterol (PROVENTIL) (2.5 MG/3ML) 0.083% nebulizer solution Take 3 mLs (2.5 mg total) by nebulization every 2 (two) hours as needed for wheezing. 07/17/15   Marguarite Arbour, MD  alum & mag hydroxide-simeth (MAALOX/MYLANTA) 200-200-20 MG/5ML suspension Take 30 mLs by mouth every 4 (four) hours as needed for indigestion or heartburn. 05/30/15   Marguarite Arbour, MD  cefUROXime (CEFTIN) 250 MG tablet Take 1 tablet (250 mg total) by mouth 2 (two) times daily with  a meal. Patient not taking: Reported on 08/26/2015 07/17/15   Marguarite ArbourJeffrey D Sparks, MD  ondansetron (ZOFRAN) 4 MG tablet Take 1 tablet (4 mg total) by mouth every 6 (six) hours as needed for nausea. Patient not taking: Reported on 07/22/2015 04/19/15   Marguarite ArbourJeffrey D Sparks, MD  torsemide (DEMADEX) 100 MG tablet Take 50 mg by mouth daily.    Historical Provider, MD      VITAL SIGNS:  Blood pressure 111/84, pulse 73, temperature 95.8 F (35.4 C), temperature source Rectal, resp. rate 19, height 6' (1.829 m), weight 171.6 kg (378 lb 5 oz), SpO2 93 %.  PHYSICAL EXAMINATION:  GENERAL:  59 y.o.-year-old patient lying in the bed, BiPAP, obese, critically ill appearing  EYES: Pupils equal, round, reactive to light and accommodation.   HEENT: Head atraumatic, normocephalic. Oropharynx and nasopharynx clear. Mucous membranes dry  NECK:  Supple, no jugular venous distention. No thyroid enlargement, no tenderness.  LUNGS:Coarse breath sounds with diffuse wheezing, fair air movement  CARDIOVASCULAR:  distant, S1, S2 normal. No murmurs, rubs, or gallops.  ABDOMEN: Soft, nontender, nondistended. Bowel sounds present. No organomegaly or mass. Guarding no rebound  EXTREMITIES:2+ pitting edema both lower extremities through the thigh and also over the abdomen, cyanosis, or clubbing.  NEUROLOGIC: Cranial nerves II  through XII are grossly  intact.  globally weak but does move all 4 extremities. Does not participate fully in the neurologic exam   PSYCHIATRIC: Groggy but arouses to voice follows some commands,  SKIN: Erythema and induration over bilateral lower extremities to the mid lower leg, there is a healing wound over the right heel which is about 4 cm x 4 cm across, no purulent drainage from this wound, some bleeding, granulation tissue is visible, there is also wound over the inner right buttock which is about 5 cm x 5 cm across, full skin thickness, purulent material is visible, an adjacent lesion over the right side of the scrotum of similar size also with purulent material and bleeding  LABORATORY PANEL:   CBC  Recent Labs Lab 08/31/2015 1632  WBC 7.1  HGB 9.9*  HCT 31.6*  PLT 248   ------------------------------------------------------------------------------------------------------------------  Chemistries   Recent Labs Lab 08/30/2015 1632  NA 129*  K 5.8*  CL 95*  CO2 24  GLUCOSE 189*  BUN 77*  CREATININE 2.82*  CALCIUM 8.4*  AST 28  ALT 11*  ALKPHOS 173*  BILITOT 0.9   ------------------------------------------------------------------------------------------------------------------  Cardiac Enzymes  Recent Labs Lab 09/06/2015 1632  TROPONINI <0.03   ------------------------------------------------------------------------------------------------------------------  RADIOLOGY:  Dg Chest Portable 1 View  09/09/2015  CLINICAL DATA:  Shortness of Breath, weakness EXAM: PORTABLE CHEST 1 VIEW COMPARISON:  10/20/ 16 FINDINGS: Cardiomegaly again noted. Persistent mild congestion/ pulmonary edema. Again noted small left pleural effusion left basilar atelectasis or infiltrate. IMPRESSION: Persistent mild congestion/ pulmonary edema. Small left pleural effusion left basilar atelectasis or infiltrate. Electronically Signed   By: Natasha MeadLiviu  Pop M.D.   On: 08/30/2015 16:03    EKG:    Orders placed or performed during the hospital encounter of 08/28/2015  . EKG 12-Lead  . EKG 12-Lead  . ED EKG  . ED EKG  . EKG 12-Lead  . EKG 12-Lead    IMPRESSION AND PLAN:   1. Sepsis likely due to cellulitis and skin infection: Hypothermic, hypotensive, hypoxic. Blood and wound cultures are pending. Have started vancomycin and Zosyn. Will order wound care consultation. Lactate is normal. Has received 1 L bolus in the ED and will continue  on bicarbonate infusion due to acidosis.  2. Acute on chronic respiratory failure: Has advanced COPD and uses 2 L of oxygen via nasal cannula at home. Has had to increase this to 5 L recently. Also with chronic diastolic heart failure and pulmonary edema on chest x-ray. Currently on BiPAP due to increased work of breathing and hypoxia.. Tolerating well. Chest x-ray shows pulmonary edema. Unable to obtain CT scan due to patient's girth.   3. Acute renal failure on CKD3: Baseline creatinine around 1.7, today 2.8. Family reports decreased oral intake and decreased urine output. Likely intravascularly depleted despite anasarca. We'll continue with gentle hydration at this time. Nephrology consultation is in place and Dr. Deetta Perla has seen the patient this evening.  4. Hyperkalemia: Potassium 5.8. He has received multiple nebulizers and insulin. No EKG changes. We'll repeat BMP at this time.  5. Metabolic acidosis: Likely due to renal failure as well as mild hypercarbia. Nephrology following. Bicarbonate drip.  6. Anemia: Likely due to anemia of chronic disease. Hemoglobin is stable when compared to prior values.  7. Hyponatremia: Due to volume depletion. Recheck BMP after receiving normal saline..  8. Diabetes mellitus type 2: Check hemoglobin A1c. Start sliding scale  9. COPD exacerbation: Increased oxygen demand, wheezing on examination. Will treat with every 6 hours DuoNeb's, BiPAP, Solu-Medrol. Already on broad-spectrum antibiotics  10. Acute on  chronic diastolic heart failure exacerbation: Chest x-ray showing pulmonary edema. Patient has anasarca. Unfortunately due to renal failure unable to diurese. Have discussed with nephrology. Gentle hydration with bicarbonate drip. Continue BiPAP.  11. Atrial fibrillation: Rate is controlled. Does not have a history of known atrial fibrillation. Does have a history of DVTs and PEs but is not on anticoagulation. We'll continue to monitor on telemetry. Will obtain cardiology consultation.  All the records are reviewed and case discussed with ED provider. Management plans discussed with the patient, family and they are in agreement.  CODE STATUS: Full. This was discussed with the patient's wife on admission. I will order a palliative care consultation. The wife did state that the patient would not want prolonged life support.  TOTAL TIME TAKING CARE OF THIS PATIENT: 60 minutes.  Greater than 50% of time spent in coordination of care and counseling. Case discussed with nephrology, eICU, pulmonology.  Elby Showers M.D on 08/31/2015 at 8:08 PM  Between 7am to 6pm - Pager - [phone]  After 6pm go to [email or web address] - password [email or web address] [facility]  [place] [facility]  Office  [phone]  CC: Primary care physician; Marguarite Arbour, MD

## 2015-08-30 DIAGNOSIS — R601 Generalized edema: Secondary | ICD-10-CM

## 2015-08-30 DIAGNOSIS — J9621 Acute and chronic respiratory failure with hypoxia: Secondary | ICD-10-CM

## 2015-08-30 DIAGNOSIS — N189 Chronic kidney disease, unspecified: Secondary | ICD-10-CM

## 2015-08-30 DIAGNOSIS — D631 Anemia in chronic kidney disease: Secondary | ICD-10-CM

## 2015-08-30 DIAGNOSIS — L0390 Cellulitis, unspecified: Secondary | ICD-10-CM

## 2015-08-30 DIAGNOSIS — A419 Sepsis, unspecified organism: Principal | ICD-10-CM

## 2015-08-30 DIAGNOSIS — T68XXXA Hypothermia, initial encounter: Secondary | ICD-10-CM

## 2015-08-30 DIAGNOSIS — T148XXA Other injury of unspecified body region, initial encounter: Secondary | ICD-10-CM | POA: Insufficient documentation

## 2015-08-30 DIAGNOSIS — N183 Chronic kidney disease, stage 3 (moderate): Secondary | ICD-10-CM

## 2015-08-30 DIAGNOSIS — N179 Acute kidney failure, unspecified: Secondary | ICD-10-CM

## 2015-08-30 DIAGNOSIS — R238 Other skin changes: Secondary | ICD-10-CM

## 2015-08-30 DIAGNOSIS — I5033 Acute on chronic diastolic (congestive) heart failure: Secondary | ICD-10-CM

## 2015-08-30 DIAGNOSIS — N19 Unspecified kidney failure: Secondary | ICD-10-CM

## 2015-08-30 LAB — CBC
HEMATOCRIT: 30.8 % — AB (ref 40.0–52.0)
Hemoglobin: 10.1 g/dL — ABNORMAL LOW (ref 13.0–18.0)
MCH: 27.2 pg (ref 26.0–34.0)
MCHC: 32.6 g/dL (ref 32.0–36.0)
MCV: 83.3 fL (ref 80.0–100.0)
Platelets: 217 10*3/uL (ref 150–440)
RBC: 3.7 MIL/uL — ABNORMAL LOW (ref 4.40–5.90)
RDW: 19.1 % — AB (ref 11.5–14.5)
WBC: 6.6 10*3/uL (ref 3.8–10.6)

## 2015-08-30 LAB — TROPONIN I
Troponin I: 0.04 ng/mL — ABNORMAL HIGH (ref <0.031)
Troponin I: <0.03 ng/mL (ref <0.031)

## 2015-08-30 LAB — BASIC METABOLIC PANEL
ANION GAP: 11 (ref 5–15)
Anion gap: 11 (ref 5–15)
Anion gap: 11 (ref 5–15)
BUN: 81 mg/dL — AB (ref 6–20)
BUN: 79 mg/dL — AB (ref 6–20)
BUN: 80 mg/dL — AB (ref 6–20)
CHLORIDE: 99 mmol/L — AB (ref 101–111)
CHLORIDE: 95 mmol/L — AB (ref 101–111)
CHLORIDE: 95 mmol/L — AB (ref 101–111)
CO2: 22 mmol/L (ref 22–32)
CO2: 24 mmol/L (ref 22–32)
CO2: 24 mmol/L (ref 22–32)
CREATININE: 2.86 mg/dL — AB (ref 0.61–1.24)
Calcium: 8.3 mg/dL — ABNORMAL LOW (ref 8.9–10.3)
Calcium: 8.2 mg/dL — ABNORMAL LOW (ref 8.9–10.3)
Calcium: 8.2 mg/dL — ABNORMAL LOW (ref 8.9–10.3)
Creatinine, Ser: 2.82 mg/dL — ABNORMAL HIGH (ref 0.61–1.24)
Creatinine, Ser: 3.05 mg/dL — ABNORMAL HIGH (ref 0.61–1.24)
GFR calc Af Amer: 26 mL/min — ABNORMAL LOW (ref >60)
GFR calc Af Amer: 27 mL/min — ABNORMAL LOW (ref >60)
GFR calc Af Amer: 24 mL/min — ABNORMAL LOW (ref >60)
GFR calc non Af Amer: 23 mL/min — ABNORMAL LOW (ref >60)
GFR calc non Af Amer: 23 mL/min — ABNORMAL LOW (ref >60)
GFR, EST NON AFRICAN AMERICAN: 21 mL/min — AB (ref >60)
GLUCOSE: 194 mg/dL — AB (ref 65–99)
GLUCOSE: 224 mg/dL — AB (ref 65–99)
Glucose, Bld: 218 mg/dL — ABNORMAL HIGH (ref 65–99)
POTASSIUM: 5.9 mmol/L — AB (ref 3.5–5.1)
POTASSIUM: 6.2 mmol/L — AB (ref 3.5–5.1)
POTASSIUM: 6.1 mmol/L — AB (ref 3.5–5.1)
SODIUM: 130 mmol/L — AB (ref 135–145)
Sodium: 132 mmol/L — ABNORMAL LOW (ref 135–145)
Sodium: 130 mmol/L — ABNORMAL LOW (ref 135–145)

## 2015-08-30 LAB — GLUCOSE, CAPILLARY
GLUCOSE-CAPILLARY: 226 mg/dL — AB (ref 65–99)
GLUCOSE-CAPILLARY: 212 mg/dL — AB (ref 65–99)
GLUCOSE-CAPILLARY: 221 mg/dL — AB (ref 65–99)
GLUCOSE-CAPILLARY: 245 mg/dL — AB (ref 65–99)

## 2015-08-30 LAB — MRSA PCR SCREENING: MRSA by PCR: NEGATIVE

## 2015-08-30 LAB — POTASSIUM: POTASSIUM: 6.1 mmol/L — AB (ref 3.5–5.1)

## 2015-08-30 LAB — HEMOGLOBIN A1C: Hgb A1c MFr Bld: 7.2 % — ABNORMAL HIGH (ref 4.0–6.0)

## 2015-08-30 MED ORDER — CHLORHEXIDINE GLUCONATE 0.12 % MT SOLN
15.0000 mL | Freq: Two times a day (BID) | OROMUCOSAL | Status: DC
  Administered 2015-08-30 – 2015-09-01 (×4): 15 mL via OROMUCOSAL
  Filled 2015-08-30 (×2): qty 15

## 2015-08-30 MED ORDER — OXYCODONE-ACETAMINOPHEN 5-325 MG PO TABS
1.0000 | ORAL_TABLET | Freq: Four times a day (QID) | ORAL | Status: DC | PRN
  Administered 2015-08-30 – 2015-09-01 (×3): 1 via ORAL
  Filled 2015-08-30 (×3): qty 1

## 2015-08-30 MED ORDER — CETYLPYRIDINIUM CHLORIDE 0.05 % MT LIQD
7.0000 mL | Freq: Two times a day (BID) | OROMUCOSAL | Status: DC
  Administered 2015-08-30 – 2015-08-31 (×3): 7 mL via OROMUCOSAL

## 2015-08-30 MED ORDER — SODIUM POLYSTYRENE SULFONATE 15 GM/60ML PO SUSP: 30.0000 g | Freq: Once | ORAL | Status: DC

## 2015-08-30 MED ORDER — FUROSEMIDE 10 MG/ML IJ SOLN
40.0000 mg | Freq: Once | INTRAMUSCULAR | Status: AC
Start: 2015-08-30 — End: 2015-08-30
  Administered 2015-08-30: 40 mg via INTRAVENOUS
  Filled 2015-08-30: qty 4

## 2015-08-30 MED ORDER — FUROSEMIDE 10 MG/ML IJ SOLN
40.0000 mg | Freq: Once | INTRAMUSCULAR | Status: AC
  Administered 2015-08-30: 40 mg via INTRAVENOUS
  Filled 2015-08-30: qty 4

## 2015-08-30 MED ORDER — SODIUM POLYSTYRENE SULFONATE 15 GM/60ML PO SUSP
30.0000 g | ORAL | Status: AC
  Administered 2015-08-30: 30 g via ORAL
  Filled 2015-08-30: qty 120

## 2015-08-30 NOTE — Progress Notes (Signed)
   08/30/15 0900  Clinical Encounter Type  Visited With Patient and family together  Visit Type Initial  Referral From Nurse  Consult/Referral To Chaplain  Spiritual Encounters  Spiritual Needs Other (Comment)  Stress Factors  Patient Stress Factors None identified  Family Stress Factors None identified  Chaplain received an order for a prayer request. Micah FlesherWent in to see patient and family and offered services. When I asked how I could support them no need was identified. Patient seemed somewhat confused according to family member. Family did not have an identifiable need at the time. Chaplain Chloee Tena A. Demarquis Osley Ext. [phone]

## 2015-08-30 NOTE — Progress Notes (Signed)
Palliative Care Update:  I rechecked pt to see if his wife had been able to talk to her son about the proposed DNR status.  She says that her daughter 'has been texting' him and she wants to wait to see what he says.  I mentioned that I will be leaving soon, and was hoping to get son's input before I leave for the day (and for the weekend).  I volunteered to call her son if she would permit this but she said she wanted to 'just wait' "so he won't feel left out'.   Wife tells me she asked her husband about not doing 'full code' and he said to her that 'it doesn't matter'.  She takes this as permission to order a DNR --but again, the son has to have his say on this per wife.    She is asking more and more about comfort measures. She would like the alarms to stop ringing. They go off b/c pt's sats are in the mid-eighties on 5 LPM.  He did a bit better after a nebulizer.  He does a bit better on BIPAP per wife.  Pt is waving to his wife. She asked him why he is waving. He did not answer.  I would like to get the DNR established soon, and am working on this --but can't push for this while wife is saying we have to wait on son's input.    When I try to talk to the pt himself, he does not talk back.  So I am unable to get a consent for comfort care or DNR from him.    Will keep trying as long as I am here today.  Also, I was present to see his scrotal and thigh wounds when several of us turned and positioned him in such a way to see these.  Dr. Sampson GoonFitzgerald is here making ID recommendations.    Suan HalterMargaret F Kashae Carstens, MD

## 2015-08-30 NOTE — Progress Notes (Signed)
[facility] - [place] at [facility]   PATIENT NAME: Tyler [name]    MR#:  [id number]  DATE OF BIRTH:  [birth year]  SUBJECTIVE:  CHIEF COMPLAINT:  Patient is off BiPAP. Shortness of breath is better reporting pain in the lower extremity and asking for pain medicine. Wife is at bedside  REVIEW OF SYSTEMS:  CONSTITUTIONAL: No fever, reporting weakness  EYES: No blurred or double vision.  EARS, NOSE, AND THROAT: No tinnitus or ear pain.  RESPIRATORY: No cough, shortness of breath, wheezing or hemoptysis.  CARDIOVASCULAR: No chest pain, orthopnea, edema.  GASTROINTESTINAL: No nausea, vomiting, diarrhea or abdominal pain.  GENITOURINARY: No dysuria, hematuria.  ENDOCRINE: No polyuria, nocturia,  HEMATOLOGY: No anemia, easy bruising or bleeding SKIN: No rash or lesion. MUSCULOSKELETAL: No joint pain or arthritis.   reports pain in his right lower extremity  NEUROLOGIC: No tingling, numbness, weakness.  PSYCHIATRY: No anxiety or depression.   DRUG ALLERGIES:   Allergies  Allergen Reactions  . Enalapril Cough  . Atorvastatin Palpitations  . Lovastatin Palpitations    VITALS:  Blood pressure 92/63, pulse 78, temperature 98.1 F (36.7 C), temperature source Axillary, resp. rate 17, height 6' (1.829 m), weight 172.1 kg (379 lb 6.6 oz), SpO2 83 %.  PHYSICAL EXAMINATION:  GENERAL: 59 y.o.-year-old patient lying in the bed, off BiPAP, obese EYES: Pupils equal, round, reactive to light and accommodation.  HEENT: Head atraumatic, normocephalic. Oropharynx and nasopharynx clear. Mucous membranes dry  NECK: Supple, no jugular venous distention. No thyroid enlargement, no tenderness.  LUNGS:Coarse breath sounds with diffuse wheezing, fair air movement  CARDIOVASCULAR: distant, S1, S2 normal. No murmurs, rubs, or gallops.  ABDOMEN: Soft, nontender, nondistended. Bowel sounds present. No organomegaly or mass. Guarding no rebound  EXTREMITIES:2+ pitting  edema both lower extremities through the thigh and also over the abdomen, cyanosis, or clubbing.  NEUROLOGIC: Cranial nerves II through XII are grossly intact. globally weak but does move all 4 extremities.  PSYCHIATRIC: Awake and alert, oriented 3 SKIN: Erythema and induration over bilateral lower extremities to the mid lower leg, there is a healing wound over the right heel which is about 4 cm x 4 cm across, no purulent drainage from this wound, some bleeding, granulation tissue is visible, there is also wound over the inner right buttock which is about 5 cm x 5 cm across, full skin thickness, purulent material is visible, an adjacent lesion over the right side of the scrotum of similar size also with purulent material and bleeding  LABORATORY PANEL:   CBC  Recent Labs Lab 08/30/15 0444  WBC 6.6  HGB 10.1*  HCT 30.8*  PLT 217   ------------------------------------------------------------------------------------------------------------------  Chemistries   Recent Labs Lab 09/17/2015 1632  08/30/15 1207  NA 129*  < > 130*  K 5.8*  < > 6.1*  CL 95*  < > 95*  CO2 24  < > 24  GLUCOSE 189*  < > 218*  BUN 77*  < > 80*  CREATININE 2.82*  < > 3.05*  CALCIUM 8.4*  < > 8.2*  AST 28  --   --   ALT 11*  --   --   ALKPHOS 173*  --   --   BILITOT 0.9  --   --   < > = values in this interval not displayed. ------------------------------------------------------------------------------------------------------------------  Cardiac Enzymes  Recent Labs Lab 08/30/15 1207  TROPONINI <0.03   ------------------------------------------------------------------------------------------------------------------  RADIOLOGY:  Dg Chest Portable 1 View  09/03/2015  CLINICAL DATA:  Shortness of Breath, weakness EXAM: PORTABLE CHEST 1 VIEW COMPARISON:  10/20/ 16 FINDINGS: Cardiomegaly again noted. Persistent mild congestion/ pulmonary edema. Again noted small left pleural effusion left basilar  atelectasis or infiltrate. IMPRESSION: Persistent mild congestion/ pulmonary edema. Small left pleural effusion left basilar atelectasis or infiltrate. Electronically Signed   By: Natasha MeadLiviu  Pop M.D.   On: 09/19/2015 16:03    EKG:   Orders placed or performed during the hospital encounter of 09/03/2015  . EKG 12-Lead  . EKG 12-Lead  . ED EKG  . ED EKG  . EKG 12-Lead  . EKG 12-Lead    ASSESSMENT AND PLAN:   1. Sepsis likely due to cellulitis right lower extent and scrotal cellulitis: Hypothermic, hypotensive, hypoxic at the time of admission  Blood and wound cultures are pending. Continue vancomycin and Zosyn.  Continue  wound care .  Provide IV fluids  Appreciate IDs recommendations   2. Acute on chronic respiratory failure: Has advanced COPD and uses 2 L of oxygen via nasal cannula at home and with chronic diastolic heart failure and pulmonary edema on chest x-ray.  Currently off BiPAP  . Unable to obtain CT scan due to patient's girth.  Continue Solu-Medrol, DuoNeb nebulizer treatments and broad-spectrum antibiotics  3. Acute on chronic diastolic heart failure exacerbation: Chest x-ray showing pulmonary edema. Patient has anasarca. Unfortunately due to renal failure unable to diurese. Have discussed with nephrology. Gentle hydration with bicarbonate drip. C  3. Acute renal failure secondary to cardiorenal syndrome on CKD3: Baseline creatinine around 1.7 . Likely intravascularly depleted despite anasarca. Avoid nephrotoxins received IV Lasix Follow-up with nephrology for further recommendations    4. Hyperkalemia:  Potassium 6.1. He has received multiple nebulizers and insulin. No EKG changes.  Will provide Kayexalate and repeat labs  5. Metabolic acidosis: Likely due to renal failure as well as mild hypercarbia. Nephrology following. Bicarbonate drip.  6. Anemia: Likely due to anemia of chronic disease. Hemoglobin is stable when compared to prior values.  7. Hyponatremia: Due  to volume depletion. Recheck BMP after receiving normal saline..  8. Diabetes mellitus type 2: hemoglobin A1c 7.2. Start sliding scale   11.  new onset Atrial fibrillation: Rate is controlled.  follow-up with cardiology regarding anticoagulation, NOAC     All the records are reviewed and case discussed with Care Management/Social Workerr. Management plans discussed with the patient, family and they are in agreement.  CODE STATUS: FC  TOTAL CRITICAL TIME TAKING CARE OF THIS PATIENT: 35  minutes.   POSSIBLE D/C IN 3-4 DAYS, DEPENDING ON CLINICAL CONDITION.   Ramonita LabGouru, Melonie Germani M.D on 08/30/2015 at 4:32 PM  Between 7am to 6pm - Pager - [phone] After 6pm go to [email or web address] - password [email or web address] [facility]  [place] [facility]  Office  [phone]  CC: Primary care physician; Marguarite ArbourSPARKS,JEFFREY D, MD

## 2015-08-30 NOTE — Care Management (Signed)
Spoke with palliative care.  Was able to discuss code status with patient and wife today and potential hospice plan of care is in early discussion.  Family wish to speak with patient's son to help make some decisions.  Patient him self is not actively  participating in discussion  but is present

## 2015-08-30 NOTE — Progress Notes (Signed)
Inpatient Diabetes Program Recommendations  AACE/ADA: New Consensus Statement on Inpatient Glycemic Control (2015)  Target Ranges:  Prepandial:   less than 140 mg/dL      Peak postprandial:   less than 180 mg/dL (1-2 hours)      Critically ill patients:  140 - 180 mg/dL  Results for Tyler [name], [name] (MRN [id number]) as of 08/30/2015 12:52  Ref. Range 08/22/2015 22:30 08/30/2015 07:30 08/30/2015 12:03  Glucose-Capillary Latest Ref Range: 65-99 mg/dL 782199 (H) 956226 (H) 213212 (H)   Review of Glycemic Control  Diabetes history: DM2 Outpatient Diabetes medications: NPH (as needed based on glucose) and Novolog (as needed based on glucose) Current orders for Inpatient glycemic control: Novolog 0-15 units TID with meals, Novolog 0-5 units HS  Inpatient Diabetes Program Recommendations: Insulin - Basal: If steroids are continued as ordered, please consider ordering Lantus 17 units Q24H.  Thanks, Orlando PennerMarie Zaron Zwiefelhofer, RN, MSN, CDE Diabetes Coordinator Inpatient Diabetes Program [phone] (Team Pager from 8am to 5pm) [phone] ([facility] office) [phone] [facility] office) [phone] [facility] office)

## 2015-08-30 NOTE — Progress Notes (Signed)
eLink Physician-Brief Progress Note Patient Name: Tyler AusMichael A [name] DOB: [birth year] MRN: [id number]   Date of Service  08/30/2015  HPI/Events of Note  Hypoxia, vascular congestion on CXR. KCL elevated.   eICU Interventions  Continue BiPAP, HCO3 in IV fluid.  Will give lasix x one.      Intervention Category Major Interventions: Other:  Siana Panameno 08/30/2015, 5:42 AM

## 2015-08-30 NOTE — Consult Note (Signed)
PULMONARY / CRITICAL CARE MEDICINE   Name: Tyler [name] MRN: [id number] DOB: 10-18-1955    ADMISSION DATE:  09/18/2015 CONSULTATION DATE:  08/30/15  REFERRING MD :  Dr. Clent Ridges   CHIEF COMPLAINT:   Reason for consult - sepsis, respiratory distress   HISTORY OF PRESENT ILLNESS    59 y.o. male with a known history of COPD on 2.5 L,OSA on CPAP, NYHA class III chronic diastolic heart failure, pulmonary hypertension, obstructive sleep apnea intolerant of CPAP, history of DVTs and PEs, CKD III and recent nonhealing wounds of the lower extremities presents 09/14/2015 from home when his wound care nurse noted him to be more confused and weaker than usual. History per records - Per his wife he fell twice on Monday. At home he is minimally active, sits in a chair all day, for the past 2 days he has been unable to stand. For the past 2 weeks he has been developing worsening wounds on his bottom and scrotum. He has been confused. He has been short of breath requiring his oxygen to be increased to 5 L to maintain sats over 90%. He has gained at least 10 pounds. On presentation to the emergency room he was hypothermic, hypoxic, in renal and respiratory failure.  He was started on Bipap and brought to the ICU.  PCCM consulted for further management. ABG consistent with mild resp acidosis Review of chart show 2 admission for cellulitis in the last 2 months, along with multiple outpatient visit for cellulitis related issues.    SIGNIFICANT EVENTS   ECHO 02/16/15 >> EF 50-55%, mild MR, mild LA dilation, mild RV dilation, mild RA dilation, mod TR, sm to mod partially loculated pericardial effusion posterior to the heart.  Elevated RVSP   PAST MEDICAL HISTORY    :  Past Medical History  Diagnosis Date  . Cataract   . CHF (congestive heart failure) (HCC)   . COPD (chronic obstructive pulmonary disease) (HCC)   . Diabetes mellitus without complication (HCC)   . Sleep apnea   . Oxygen deficiency   .  Thyroid disease   . Gout   . Hypothyroidism   . CKD (chronic kidney disease) stage 3, GFR 30-59 ml/min   . Asthma   . Hypertension    Past Surgical History  Procedure Laterality Date  . Cholecystectomy      2002  . Eye surgery      right cataract removed 3/21  . Vena cava filter placement     Prior to Admission medications   Medication Sig Start Date End Date Taking? Authorizing Provider  acetaminophen (TYLENOL) 500 MG tablet Take 1,000 mg by mouth every 6 (six) hours as needed for mild pain or headache.   Yes Historical Provider, MD  ammonium lactate (LAC-HYDRIN) 12 % lotion Apply topically as needed for dry skin. Patient taking differently: Apply 1 application topically as needed for dry skin.  07/05/15  Yes Marguarite Arbour, MD  aspirin EC 81 MG EC tablet Take 1 tablet (81 mg total) by mouth daily. 07/17/15  Yes Marguarite Arbour, MD  cefUROXime (CEFTIN) 500 MG tablet Take 500 mg by mouth 2 (two) times daily with a meal. For 10 days 08/21/15  Yes Historical Provider, MD  colchicine 0.6 MG tablet Take 0.6 mg by mouth daily as needed (for gout flares).    Yes Historical Provider, MD  cyanocobalamin (,VITAMIN B-12,) 1000 MCG/ML injection Inject 1,000 mcg into the muscle every 30 (thirty) days.   Yes Historical  Provider, MD  cyanocobalamin 2000 MCG tablet Take 2,000 mcg by mouth daily.   Yes Historical Provider, MD  docusate sodium (COLACE) 100 MG capsule Take 1 capsule (100 mg total) by mouth 2 (two) times daily. Patient taking differently: Take 100 mg by mouth daily.  07/17/15  Yes Marguarite Arbour, MD  fluticasone (FLONASE) 50 MCG/ACT nasal spray Place 2 sprays into both nostrils daily as needed for rhinitis.   Yes Historical Provider, MD  furosemide (LASIX) 40 MG tablet Take 1 tablet (40 mg total) by mouth 2 (two) times daily. 07/17/15  Yes Marguarite Arbour, MD  gabapentin (NEURONTIN) 100 MG capsule Take 100 mg by mouth 2 (two) times daily.   Yes Historical Provider, MD  hydrOXYzine  (ATARAX/VISTARIL) 25 MG tablet Take 25-50 mg by mouth 3 (three) times daily as needed for itching.   Yes Historical Provider, MD  insulin NPH Human (HUMULIN N,NOVOLIN N) 100 UNIT/ML injection Inject into the skin as needed (for high blood sugar). Pt states that he uses per sliding scale.   Yes Historical Provider, MD  insulin regular (NOVOLIN R,HUMULIN R) 100 units/mL injection Inject into the skin as needed for high blood sugar. Pt states that he uses per sliding scale.   Yes Historical Provider, MD  ipratropium-albuterol (DUONEB) 0.5-2.5 (3) MG/3ML SOLN Take 3 mLs by nebulization 2 (two) times daily.   Yes Historical Provider, MD  lactulose (CHRONULAC) 10 GM/15ML solution Take 20 g by mouth 2 (two) times daily as needed for mild constipation.    Yes Historical Provider, MD  levothyroxine (SYNTHROID, LEVOTHROID) 100 MCG tablet Take 100 mcg by mouth daily.   Yes Historical Provider, MD  ondansetron (ZOFRAN-ODT) 4 MG disintegrating tablet Take 4 mg by mouth every 8 (eight) hours as needed for nausea or vomiting.   Yes Historical Provider, MD  PARoxetine (PAXIL) 20 MG tablet Take 20 mg by mouth daily.   Yes Historical Provider, MD  polyethylene glycol (MIRALAX / GLYCOLAX) packet Take 17 g by mouth daily as needed for mild constipation.    Yes Historical Provider, MD  pravastatin (PRAVACHOL) 40 MG tablet Take 40 mg by mouth at bedtime.   Yes Historical Provider, MD  salmeterol (SEREVENT) 50 MCG/DOSE diskus inhaler Inhale 1 puff into the lungs 2 (two) times daily as needed (for shortness of breath).   Yes Historical Provider, MD  spironolactone (ALDACTONE) 25 MG tablet Take 25 mg by mouth 2 (two) times daily.   Yes Historical Provider, MD  tamsulosin (FLOMAX) 0.4 MG CAPS capsule Take 1 capsule (0.4 mg total) by mouth daily. 04/19/15  Yes Marguarite Arbour, MD  albuterol (PROVENTIL) (2.5 MG/3ML) 0.083% nebulizer solution Take 3 mLs (2.5 mg total) by nebulization every 2 (two) hours as needed for wheezing.  07/17/15   Marguarite Arbour, MD  alum & mag hydroxide-simeth (MAALOX/MYLANTA) 200-200-20 MG/5ML suspension Take 30 mLs by mouth every 4 (four) hours as needed for indigestion or heartburn. 05/30/15   Marguarite Arbour, MD  cefUROXime (CEFTIN) 250 MG tablet Take 1 tablet (250 mg total) by mouth 2 (two) times daily with a meal. Patient not taking: Reported on 08/26/2015 07/17/15   Marguarite Arbour, MD  ondansetron (ZOFRAN) 4 MG tablet Take 1 tablet (4 mg total) by mouth every 6 (six) hours as needed for nausea. Patient not taking: Reported on 07/22/2015 04/19/15   Marguarite Arbour, MD  torsemide (DEMADEX) 100 MG tablet Take 50 mg by mouth daily.    Historical Provider, MD  Allergies  Allergen Reactions  . Enalapril Cough  . Atorvastatin Palpitations  . Lovastatin Palpitations     FAMILY HISTORY   Family History  Problem Relation Age of Onset  . Diabetes Sister   . Diabetes Brother   . Deep vein thrombosis Mother   . CVA Father       SOCIAL HISTORY    reports that he has been smoking Cigars.  He has never used smokeless tobacco. He reports that he does not drink alcohol or use illicit drugs.  Review of Systems  Constitutional: Positive for malaise/fatigue and diaphoresis. Negative for fever, chills and weight loss.  HENT: Negative for ear discharge, ear pain, hearing loss and tinnitus.   Eyes: Negative for blurred vision, double vision and pain.  Respiratory: Positive for cough and shortness of breath. Negative for hemoptysis, sputum production and wheezing.   Cardiovascular: Negative for chest pain.  Gastrointestinal: Negative for heartburn, nausea and vomiting.  Musculoskeletal: Negative for myalgias.  Skin: Positive for itching and rash.  Neurological: Positive for weakness. Negative for dizziness, tingling and headaches.  Endo/Heme/Allergies: Bruises/bleeds easily.      VITAL SIGNS    Temp:  [95.8 F (35.4 C)-98.4 F (36.9 C)] 97.7 F (36.5 C) (12/09 0700) Pulse  Rate:  [59-89] 71 (12/09 0700) Resp:  [11-21] 17 (12/09 0700) BP: (91-122)/(60-84) 106/71 mmHg (12/09 0700) SpO2:  [84 %-100 %] 94 % (12/09 0700) FiO2 (%):  [50 %-55 %] 50 % (12/09 0240) Weight:  [378 lb 5 oz (171.6 kg)-379 lb 6.6 oz (172.1 kg)] 379 lb 6.6 oz (172.1 kg) (12/09 0602) HEMODYNAMICS:   VENTILATOR SETTINGS: Vent Mode:  [-]  FiO2 (%):  [50 %-55 %] 50 % INTAKE / OUTPUT:  Intake/Output Summary (Last 24 hours) at 08/30/15 0803 Last data filed at 08/30/15 0618  Gross per 24 hour  Intake    800 ml  Output    450 ml  Net    350 ml       PHYSICAL EXAM   Physical Exam  Constitutional: He is oriented to person, place, and time. No distress.  HENT:  Head: Normocephalic and atraumatic.  Right Ear: External ear normal.  Eyes: Pupils are equal, round, and reactive to light.  Neck: Normal range of motion. Neck supple. No tracheal deviation present. No thyromegaly present.  Cardiovascular: Normal rate, regular rhythm and normal heart sounds.  Exam reveals no gallop and no friction rub.   No murmur heard. Pulmonary/Chest: Effort normal. No respiratory distress. He has no wheezes.  Good airway entry and effort. Dec basilar bs, no crackles  Abdominal:  Obese abd,   Genitourinary:  Scrotal edema  Musculoskeletal: He exhibits edema.  Neurological: He is alert and oriented to person, place, and time.  Skin: Skin is warm. There is erythema.  bilatral LE [place], profound, LE venous stasis changes, erythematous appearance, no overt drainage on LEs  Nursing note and vitals reviewed.      LABS   LABS:  CBC  Recent Labs Lab 09/19/2015 1632 08/30/15 0444  WBC 7.1 6.6  HGB 9.9* 10.1*  HCT 31.6* 30.8*  PLT 248 217   Coag's  Recent Labs Lab 09/04/2015 1632  APTT 33  INR 1.14   BMET  Recent Labs Lab 09/19/2015 1632 08/22/2015 2320 08/30/15 0444  NA 129* 132* 130*  K 5.8* 5.9* 6.2*  CL 95* 99* 95*  CO2 24 22 24   BUN 77* 81* 79*  CREATININE 2.82* 2.86* 2.82*   GLUCOSE 189* 194* 224*  Electrolytes  Recent Labs Lab 08/31/2015 1632 09/06/2015 2320 08/30/15 0444  CALCIUM 8.4* 8.3* 8.2*   Sepsis Markers  Recent Labs Lab 08/24/2015 1609 09/07/2015 1848  LATICACIDVEN 2.0 1.7   ABG  Recent Labs Lab 09/09/2015 1715  PHART 7.29*  PCO2ART 52*  PO2ART 58*   Liver Enzymes  Recent Labs Lab 09/13/2015 1632  AST 28  ALT 11*  ALKPHOS 173*  BILITOT 0.9  ALBUMIN 2.3*   Cardiac Enzymes  Recent Labs Lab 09/19/2015 1632 09/06/2015 2328 08/30/15 0444  TROPONINI <0.03 0.04* <0.03   Glucose  Recent Labs Lab 08/27/2015 2230  GLUCAP 199*     Recent Results (from the past 240 hour(s))  Blood culture (routine x 2)     Status: None (Preliminary result)   Collection Time: 09/07/2015  4:09 PM  Result Value Ref Range Status   Specimen Description BLOOD RIGHT HAND  Final   Special Requests Normal  Final   Culture NO GROWTH < 24 HOURS  Final   Report Status PENDING  Incomplete  Blood culture (routine x 2)     Status: None (Preliminary result)   Collection Time: 09/02/2015  4:32 PM  Result Value Ref Range Status   Specimen Description BLOOD RIGHT HAND  Final   Special Requests BOTTLES DRAWN AEROBIC AND ANAEROBIC 7CC  Final   Culture NO GROWTH < 24 HOURS  Final   Report Status PENDING  Incomplete  MRSA PCR Screening     Status: None   Collection Time: 09/02/2015 10:23 PM  Result Value Ref Range Status   MRSA by PCR NEGATIVE NEGATIVE Final    Comment:        The GeneXpert MRSA Assay (FDA approved for NASAL specimens only), is one component of a comprehensive MRSA colonization surveillance program. It is not intended to diagnose MRSA infection nor to guide or monitor treatment for MRSA infections.      Current facility-administered medications:  .  acetaminophen (TYLENOL) tablet 650 mg, 650 mg, Oral, Q6H PRN **OR** acetaminophen (TYLENOL) suppository 650 mg, 650 mg, Rectal, Q6H PRN, Gale Journey, MD .  alum & mag hydroxide-simeth  (MAALOX/MYLANTA) 200-200-20 MG/5ML suspension 30 mL, 30 mL, Oral, Q4H PRN, Gale Journey, MD .  antiseptic oral rinse (CPC / CETYLPYRIDINIUM CHLORIDE 0.05%) solution 7 mL, 7 mL, Mouth Rinse, q12n4p, Gale Journey, MD .  aspirin EC tablet 81 mg, 81 mg, Oral, Daily, Gale Journey, MD, 81 mg at 09/05/2015 2115 .  chlorhexidine (PERIDEX) 0.12 % solution 15 mL, 15 mL, Mouth Rinse, BID, Gale Journey, MD .  heparin injection 5,000 Units, 5,000 Units, Subcutaneous, 3 times per day, Gale Journey, MD, 5,000 Units at 08/30/15 [phone] .  insulin aspart (novoLOG) injection 0-15 Units, 0-15 Units, Subcutaneous, TID WC, Gale Journey, MD .  insulin aspart (novoLOG) injection 0-5 Units, 0-5 Units, Subcutaneous, QHS, Gale Journey, MD, 0 Units at 09/13/2015 2200 .  ipratropium-albuterol (DUONEB) 0.5-2.5 (3) MG/3ML nebulizer solution 3 mL, 3 mL, Nebulization, Q6H, Gale Journey, MD, 3 mL at 08/30/15 0755 .  levothyroxine (SYNTHROID, LEVOTHROID) tablet 100 mcg, 100 mcg, Oral, QAC breakfast, Gale Journey, MD .  methylPREDNISolone sodium succinate (SOLU-MEDROL) 125 mg/2 mL injection 60 mg, 60 mg, Intravenous, Q6H, Gale Journey, MD, 60 mg at 08/30/15 0314 .  ondansetron (ZOFRAN) tablet 4 mg, 4 mg, Oral, Q6H PRN **OR** ondansetron (ZOFRAN) injection 4 mg, 4 mg, Intravenous, Q6H PRN, Gale Journey, MD .  piperacillin-tazobactam (ZOSYN) IVPB 4.5  g, 4.5 g, Intravenous, 3 times per day, Gale Journey, MD, 4.5 g at 08/30/15 0618 .  polyethylene glycol (MIRALAX / GLYCOLAX) packet 17 g, 17 g, Oral, Daily PRN, Gale Journey, MD .  pravastatin (PRAVACHOL) tablet 40 mg, 40 mg, Oral, QHS, Gale Journey, MD, 40 mg at 08/25/2015 2200 .  sodium chloride 0.9 % injection 3 mL, 3 mL, Intravenous, Q12H, Gale Journey, MD, 3 mL at 09/03/2015 2230 .  tamsulosin (FLOMAX) capsule 0.4 mg, 0.4 mg, Oral, Daily, Gale Journey, MD, 0.4 mg at 09/05/2015 2115 .  vancomycin (VANCOCIN) 1,750 mg in  sodium chloride 0.9 % 500 mL IVPB, 1,750 mg, Intravenous, Q24H, Gale Journey, MD  IMAGING    Dg Chest Portable 1 View  09/16/2015  CLINICAL DATA:  Shortness of Breath, weakness EXAM: PORTABLE CHEST 1 VIEW COMPARISON:  10/20/ 16 FINDINGS: Cardiomegaly again noted. Persistent mild congestion/ pulmonary edema. Again noted small left pleural effusion left basilar atelectasis or infiltrate. IMPRESSION: Persistent mild congestion/ pulmonary edema. Small left pleural effusion left basilar atelectasis or infiltrate. Electronically Signed   By: Natasha Mead M.D.   On: 08/25/2015 16:03      Indwelling Urinary Catheter continued, requirement due to   Reason to continue Indwelling Urinary Catheter for strict Intake/Output monitoring for hemodynamic instability   Central Line continued, requirement due to   Reason to continue [employer] Monitoring of central venous pressure or other hemodynamic parameters   Ventilator continued, requirement due to, resp failure    Ventilator Sedation RASS 0 to -2   Cultures: BCx2 12/8>> UC  Sputum MRSA neg 12/8 Wound 12/8>>  Antibiotics: Vanc 12/8>> Zosyn 12/8>>  Lines:   ASSESSMENT/PLAN  59 yo male with PMHx of LE cellulitis, recurrent, CKD 3, COPD on 2-3L O2, anasarca,dCHF, present with continued cellulitis of the b\l LE, respiratory acidosis, and AMS  PULMONARY Sepsis  - likely due to cellulitis and skin infection - cont with ABx - no overt respirator distress this morning - tolerated Bipap overnight - may continue while in the hospital - respiratory acidosis now resolving Acute on chronic respiratory failure:  - secondary to COPD - cont with PRN bipap and supplemental O2 - maintain O2 sats >88% - exacerbated by fluid overload inconjunction with dCHF - may benefit from further diuresis inaddition to Bipap  CARDIOVASCULAR Acute on chronic diastolic heart failure exacerbation - Chest x-ray showing pulmonary edema. - received lasix this  am, scheduled for another dose - nephrology following CKD - Patient has anasarca.  - Continue BiPAP. Atrial fibrillation - Rate is controlled - no known history of atrial fibrillation- stable currently and rate controlled Hx of DVTs and PEs - not on anticoagulation RENAL Acute renal failure with CKD3 - Baseline creatinine around 1.7, today >2. - most likely due to dec po intake and UOP - Nephrology following - appreciate recs  Anemia: Likely due to anemia of chronic disease. Hemoglobin is stable Hyponatremia: Due to volume depletion. - follow BMP GASTROINTESTINAL SUP  HEMATOLOGIC Follow cbc  INFECTIOUS Cellulitis Chronic venous stasis -cont with antibiotics - follow up wound cultures - adjust antibiotics based on S/S from cx  ENDOCRINE -ICU hypo/hyperglycemia protocol  NEUROLOGIC AMS RASS goal: 0 - neuro status improving, almost back at baseline Metabolic encephalopathy   I have personally obtained a history, examined the patient, evaluated laboratory and imaging results, formulated the assessment and plan and placed orders.  The Patient requires high complexity decision making for assessment and support, frequent evaluation  and titration of therapies, application of advanced monitoring technologies and extensive interpretation of multiple databases. Critical Care Time devoted to patient care services described in this note is 50 minutes.   Overall, patient is critically ill, prognosis is guarded. Patient at high risk for cardiac arrest and death.   Stephanie Acre, MD [facility] Pager [phone] (please enter 7-digits) On Call Pager - [phone] (please enter 7-digits)     08/30/2015, 8:03 AM  Note: This note was prepared with Dragon dictation along with smaller phrase technology. Any transcriptional errors that result from this process are unintentional.

## 2015-08-30 NOTE — Consult Note (Signed)
[facility] Cardiology  CARDIOLOGY CONSULT NOTE  Patient ID: QUENTION MCNEILL MRN: [id number] DOB/AGE: 60/09/1955 60 y.o.  Admit date: 09/19/2015 Referring Physician Clent Ridges Primary Physician [facility] Primary Cardiologist  Reason for Consultation atrial fibrillation  HPI: 59 year old gentleman referred for evaluation of paroxysmal fibrillation. She has known COPD, hypertension, sleep apnea, history of DVTs and PEs, with diabetes, nonhealing wounds of lower extremities, with recent cellulitis of the right leg, brought to the emergency room for confusion, probable sepsis, fluid retention, and hypoxia. The majority is revealed intermittent atrial fibrillation, well controlled not on any AV nodal blocking agents. The patient currently is on aspirin. He has chronic kidney disease with BUN and creatinine of 79 and 2.82, respectively.  Review of systems complete and found to be negative unless listed above     Past Medical History  Diagnosis Date  . Cataract   . CHF (congestive heart failure) (HCC)   . COPD (chronic obstructive pulmonary disease) (HCC)   . Diabetes mellitus without complication (HCC)   . Sleep apnea   . Oxygen deficiency   . Thyroid disease   . Gout   . Hypothyroidism   . CKD (chronic kidney disease) stage 3, GFR 30-59 ml/min   . Asthma   . Hypertension     Past Surgical History  Procedure Laterality Date  . Cholecystectomy      2002  . Eye surgery      right cataract removed 3/21  . Vena cava filter placement      Prescriptions prior to admission  Medication Sig Dispense Refill Last Dose  . acetaminophen (TYLENOL) 500 MG tablet Take 1,000 mg by mouth every 6 (six) hours as needed for mild pain or headache.   PRN  . ammonium lactate (LAC-HYDRIN) 12 % lotion Apply topically as needed for dry skin. (Patient taking differently: Apply 1 application topically as needed for dry skin. ) 400 g 0 PRN  . aspirin EC 81 MG EC tablet Take 1 tablet (81 mg total) by mouth daily. 100  tablet 0 09/04/2015 at am  . cefUROXime (CEFTIN) 500 MG tablet Take 500 mg by mouth 2 (two) times daily with a meal. For 10 days   09/07/2015 at am  . colchicine 0.6 MG tablet Take 0.6 mg by mouth daily as needed (for gout flares).    PRN  . cyanocobalamin (,VITAMIN B-12,) 1000 MCG/ML injection Inject 1,000 mcg into the muscle every 30 (thirty) days.   Past Month  . cyanocobalamin 2000 MCG tablet Take 2,000 mcg by mouth daily.   09/20/2015 at am  . docusate sodium (COLACE) 100 MG capsule Take 1 capsule (100 mg total) by mouth 2 (two) times daily. (Patient taking differently: Take 100 mg by mouth daily. ) 10 capsule 0 09/02/2015 at am  . fluticasone (FLONASE) 50 MCG/ACT nasal spray Place 2 sprays into both nostrils daily as needed for rhinitis.   PRN  . furosemide (LASIX) 40 MG tablet Take 1 tablet (40 mg total) by mouth 2 (two) times daily. 60 tablet 5 09/20/2015 at am  . gabapentin (NEURONTIN) 100 MG capsule Take 100 mg by mouth 2 (two) times daily.   08/28/2015 at am  . hydrOXYzine (ATARAX/VISTARIL) 25 MG tablet Take 25-50 mg by mouth 3 (three) times daily as needed for itching.   PRN  . insulin NPH Human (HUMULIN N,NOVOLIN N) 100 UNIT/ML injection Inject into the skin as needed (for high blood sugar). Pt states that he uses per sliding scale.   PRN  .  insulin regular (NOVOLIN R,HUMULIN R) 100 units/mL injection Inject into the skin as needed for high blood sugar. Pt states that he uses per sliding scale.   PRN  . ipratropium-albuterol (DUONEB) 0.5-2.5 (3) MG/3ML SOLN Take 3 mLs by nebulization 2 (two) times daily.   09/16/2015 at am  . lactulose (CHRONULAC) 10 GM/15ML solution Take 20 g by mouth 2 (two) times daily as needed for mild constipation.    PRN  . levothyroxine (SYNTHROID, LEVOTHROID) 100 MCG tablet Take 100 mcg by mouth daily.   08/31/2015 at am  . ondansetron (ZOFRAN-ODT) 4 MG disintegrating tablet Take 4 mg by mouth every 8 (eight) hours as needed for nausea or vomiting.   PRN  . PARoxetine  (PAXIL) 20 MG tablet Take 20 mg by mouth daily.   09/19/2015 at am  . polyethylene glycol (MIRALAX / GLYCOLAX) packet Take 17 g by mouth daily as needed for mild constipation.    PRN  . pravastatin (PRAVACHOL) 40 MG tablet Take 40 mg by mouth at bedtime.   08/28/2015 at pm  . salmeterol (SEREVENT) 50 MCG/DOSE diskus inhaler Inhale 1 puff into the lungs 2 (two) times daily as needed (for shortness of breath).   PRN  . spironolactone (ALDACTONE) 25 MG tablet Take 25 mg by mouth 2 (two) times daily.   08/28/2015 at am  . tamsulosin (FLOMAX) 0.4 MG CAPS capsule Take 1 capsule (0.4 mg total) by mouth daily. 30 capsule 5 09/16/2015 at am  . albuterol (PROVENTIL) (2.5 MG/3ML) 0.083% nebulizer solution Take 3 mLs (2.5 mg total) by nebulization every 2 (two) hours as needed for wheezing. 75 mL 12 Taking  . alum & mag hydroxide-simeth (MAALOX/MYLANTA) 200-200-20 MG/5ML suspension Take 30 mLs by mouth every 4 (four) hours as needed for indigestion or heartburn. 355 mL 0 Taking  . cefUROXime (CEFTIN) 250 MG tablet Take 1 tablet (250 mg total) by mouth 2 (two) times daily with a meal. (Patient not taking: Reported on 08/26/2015) 20 tablet 0 Not Taking  . ondansetron (ZOFRAN) 4 MG tablet Take 1 tablet (4 mg total) by mouth every 6 (six) hours as needed for nausea. (Patient not taking: Reported on 07/22/2015) 20 tablet 0 Not Taking  . torsemide (DEMADEX) 100 MG tablet Take 50 mg by mouth daily.   Taking   Social History   Social History  . Marital Status: Married    Spouse Name: N/A  . Number of Children: N/A  . Years of Education: N/A   Occupational History  . Not on file.   Social History Main Topics  . Smoking status: Current Some Day Smoker -- 0.00 packs/day for 30 years    Types: Cigars  . Smokeless tobacco: Never Used     Comment: cigar 3-4 yrs 1-3 per day/former Cigarettes  . Alcohol Use: No  . Drug Use: No  . Sexual Activity: Not on file   Other Topics Concern  . Not on file   Social History  Narrative    Family History  Problem Relation Age of Onset  . Diabetes Sister   . Diabetes Brother   . Deep vein thrombosis Mother   . CVA Father       Review of systems complete and found to be negative unless listed above      PHYSICAL EXAM  General: Well developed, well nourished, in no acute distress HEENT:  Normocephalic and atramatic Neck:  No JVD.  Lungs: Clear bilaterally to auscultation and percussion. Heart: HRRR . Normal S1 and  S2 without gallops or murmurs.  Abdomen: Bowel sounds are positive, abdomen soft and non-tender  Msk:  Back normal, normal gait. Normal strength and tone for age. Extremities: No clubbing, cyanosis or edema.   Neuro: Alert and oriented X 3. Psych:  Good affect, responds appropriately  Labs:   Lab Results  Component Value Date   WBC 6.6 08/30/2015   HGB 10.1* 08/30/2015   HCT 30.8* 08/30/2015   MCV 83.3 08/30/2015   PLT 217 08/30/2015    Recent Labs Lab 09/10/2015 1632  08/30/15 0444  NA 129*  < > 130*  K 5.8*  < > 6.2*  CL 95*  < > 95*  CO2 24  < > 24  BUN 77*  < > 79*  CREATININE 2.82*  < > 2.82*  CALCIUM 8.4*  < > 8.2*  PROT 7.2  --   --   BILITOT 0.9  --   --   ALKPHOS 173*  --   --   ALT 11*  --   --   AST 28  --   --   GLUCOSE 189*  < > 224*  < > = values in this interval not displayed. Lab Results  Component Value Date   CKMB < 0.5* 10/30/2013   TROPONINI <0.03 08/30/2015   No results found for: CHOL No results found for: HDL No results found for: LDLCALC No results found for: TRIG No results found for: CHOLHDL No results found for: LDLDIRECT    Radiology: Dg Chest Portable 1 View  09/19/2015  CLINICAL DATA:  Shortness of Breath, weakness EXAM: PORTABLE CHEST 1 VIEW COMPARISON:  10/20/ 16 FINDINGS: Cardiomegaly again noted. Persistent mild congestion/ pulmonary edema. Again noted small left pleural effusion left basilar atelectasis or infiltrate. IMPRESSION: Persistent mild congestion/ pulmonary edema. Small  left pleural effusion left basilar atelectasis or infiltrate. Electronically Signed   By: Natasha Mead M.D.   On: September 01, 2015 16:03    EKG: Sinus rhythm with intermittent atrial fibrillation with a controlled rate  ASSESSMENT AND PLAN:   1. Paroxysmal atrial fibrillation, with controlled rate, asymptomatic, in the setting of nonhealing wounds, sepsis, and respiratory failure. Chads 2 score is 2, normally would be a reasonable candidate for chronic anticoagulation, however, in this situation, particularly with acute on chronic kidney failure I'm somewhat hesitant.  Recommendations  1. Agree with overall current therapy 2. Continue aspirin for stroke prevention for now 3. Consider 2-D echocardiogram if not performed within the past 6 months    Signed: Marlene Pfluger MD,PhD, [facility] 08/30/2015, 1:13 PM

## 2015-08-30 NOTE — Care Management (Signed)
Patient admitted to icu due to respiratory failure.  Required bipap but has now transitioned to nasal cannula.   Patient is currently followed by [facility] PT and SW.  Notified agency of admission.  SN was also providing wound care to patient's lower extremities three times weekly as order at last discharge in 06/2015.  His potassium this morning is 6.2. It is possible that patient may be placed on a lasix drip.

## 2015-08-30 NOTE — Progress Notes (Signed)
Initial Nutrition Assessment    INTERVENTION:   Meals and Snacks: discussed diet order during ICU rounds, MD Mungal agreeable to advancing diet to CL with orders to advance as tolerated; recommend Carb Modified/Heart Healthy Diet once advanced. Will monitor potassium, may need potassium restriction as well  NUTRITION DIAGNOSIS:   Altered nutrition related lab values related to chronic illness as evidenced by hyperkalemia  GOAL:   Patient will meet greater than or equal to 90% of their needs  MONITOR:    (Energy Intake, Anthropometrics, Electrolyte/Renal Profile, Glucose Profile)  REASON FOR ASSESSMENT:    (pressure ulcer)    ASSESSMENT:    Pt admitted with weakness, sepsis due to cellulitis, acute on chronic respiratory failure with COPD and heart failure, initially on Bipap, currently on nasal canula; acute on CKD III with hyperkalemia  Past Medical History  Diagnosis Date  . Cataract   . CHF (congestive heart failure) (HCC)   . COPD (chronic obstructive pulmonary disease) (HCC)   . Diabetes mellitus without complication (HCC)   . Sleep apnea   . Oxygen deficiency   . Thyroid disease   . Gout   . Hypothyroidism   . CKD (chronic kidney disease) stage 3, GFR 30-59 ml/min   . Asthma   . Hypertension     Diet Order: NPO  Skin: multiple stage II pressure ulcers  Electrolyte and Renal Profile:  Recent Labs Lab 09/08/2015 2320 08/30/15 0444 08/30/15 1207  BUN 81* 79* 80*  CREATININE 2.86* 2.82* 3.05*  NA 132* 130* 130*  K 5.9* 6.2* 6.1*   Glucose Profile:   Recent Labs  09/12/2015 2230 08/30/15 0730 08/30/15 1203  GLUCAP 199* 226* 212*   Meds: lasix, ss novolog, solumedrol,   Nutrition Focused Physical Exam:  Nutrition-Focused physical exam completed. Findings are no fat depletion, no muscle depletion, and moderate edema.   Height:   Ht Readings from Last 1 Encounters:  09/19/2015 6' (1.829 m)    Weight: reports 10 pound wt gain prior to  admission  Wt Readings from Last 1 Encounters:  08/30/15 379 lb 6.6 oz (172.1 kg)  Wt Readings from Last 10 Encounters:  08/30/15 379 lb 6.6 oz (172.1 kg)  07/17/15 353 lb 6.4 oz (160.3 kg)  07/05/15 308 lb 8 oz (139.935 kg)  06/13/15 363 lb 12.8 oz (165.019 kg)  05/30/15 382 lb 1.6 oz (173.319 kg)  05/03/15 373 lb 9.6 oz (169.464 kg)  04/25/15 373 lb 9.6 oz (169.464 kg)  04/19/15 349 lb 6.4 oz (158.487 kg)  03/26/15 357 lb (161.934 kg)  03/12/15 337 lb (152.862 kg)    BMI:  Body mass index is 51.45 kg/(m^2).  Estimated Nutritional Needs:   Kcal:  2187-2584 kcals (BEE 1657, 1.2 AF, 1.1-1.3 IF) using IBW 80.9 kg  Protein:  81-97 g (1.0-1.2 g/kg)   Fluid:  2025-2430 mL (25-30 ml/kg)   MODERATE Care Level  Romelle Starcherate Macy Lingenfelter MS, RD, LDN [phone] Pager

## 2015-08-30 NOTE — Consult Note (Addendum)
[facility] wound consult note Reason for Consult: Full thickness ulcer to left inner thigh and scrotum, present on admission. Ulcer to bilateral feet.  Chronic pitting edema to lower extremties Wound type: Injury to left inner thigh and scrotum appears to be moisture associated skin damage (MASD) resulting in cellulitis.   Erythema to left side abdomen and pannus Scrotal edema noted (noted acute CHF and pulmonary edema) Due to hypoxia and edema, will wait and evaluate fluid/pulmonary status once more stable for need to compress legs.  Patient is refusing to be wrapped today from toes to below knee.  States he will only allow his legs to be wrapped to mid calf, which will be minimally effective.  Pressure Ulcer POA: Yes Measurement: Left inner thigh 7 cm x 3.6 cm x 0.2 cm blood filled blistering, nontact in some areas.  Maceration to periwound Scrotum 2 cm x 2 cm x 0.1 cm denuded skin (scrotal edema noted- anasarca present) Wound bed: ruptured blood filled blistering LEft heel 2 cm x 2 cm x 0.1 cm  Right dorsal foot 2 cm x 1 cm x 0.1 cm Drainage (amount, consistency, odor) Moderate serosanguinous to left inner thigh Scant to other wounds Periwound:intact Dressing procedure/placement/frequency:Cleanse wound to left inner thigh with NS and pat gently dry.  Gently fill wound bed with calcium alginate (ALGISITE) dressing.  Cover with ABD pad and tape. Change daily.  Please place Dermatherapy pillowcase between abdominal pannus fold to wick away moisture. Will order Bariatric bed with low air loss feature.   Cleanse wounds to left heel and right foot with NS and pat gently dry.  Apply Allevyn silicone border foam dressing.  Change every 3 days. Will not follow at this time.  Please re-consult if needed.  Maple HudsonKaren Cellie Dardis RN BSN CWON Pager [phone]

## 2015-08-30 NOTE — Consult Note (Signed)
[facility] Infectious Disease     Reason for Consult Cellulitis    Referring Physician: Myrtis Ser Date of Admission:  09/19/2015   Active Problems:   Sepsis ([place])  HPI: Tyler [name] is a 59 y.o. male with known history of COPD on 2.5 L, NYHA class III chronic diastolic heart failure, pulmonary hypertension, obstructive sleep apnea intolerant of CPAP, history of DVTs and PEs, CK D3 and recent nonhealing wounds of the lower extremities admitted with falls and confusion.  He was noted to have LE redness and wound on scrotum and L upper thigh.    Past Medical History  Diagnosis Date  . Cataract   . CHF (congestive heart failure) ([place])   . COPD (chronic obstructive pulmonary disease) ([place])   . Diabetes mellitus without complication ([place])   . Sleep apnea   . Oxygen deficiency   . Thyroid disease   . Gout   . Hypothyroidism   . CKD (chronic kidney disease) stage 3, GFR 30-59 ml/min   . Asthma   . Hypertension    Past Surgical History  Procedure Laterality Date  . Cholecystectomy      2002  . Eye surgery      right cataract removed 3/21  . Vena cava filter placement     Social History  Substance Use Topics  . Smoking status: Current Some Day Smoker -- 0.00 packs/day for 30 years    Types: Cigars  . Smokeless tobacco: Never Used     Comment: cigar 3-4 yrs 1-3 per day/former Cigarettes  . Alcohol Use: No   Family History  Problem Relation Age of Onset  . Diabetes Sister   . Diabetes Brother   . Deep vein thrombosis Mother   . CVA Father     Allergies:  Allergies  Allergen Reactions  . Enalapril Cough  . Atorvastatin Palpitations  . Lovastatin Palpitations    Current antibiotics: Antibiotics Given (last 72 hours)    Date/Time Action Medication Dose Rate   08/28/2015 2355 Given   vancomycin (VANCOCIN) IVPB 1000 mg/200 mL premix 1,000 mg 200 mL/hr   08/30/15 0211 Given   piperacillin-tazobactam (ZOSYN) IVPB 4.5 g 4.5 g 25 mL/hr   08/30/15 0618 Given    piperacillin-tazobactam (ZOSYN) IVPB 4.5 g 4.5 g 25 mL/hr   08/30/15 0851 Given  [med not available from pharmacy]   vancomycin (VANCOCIN) 1,750 mg in sodium chloride 0.9 % 500 mL IVPB 1,750 mg 250 mL/hr      MEDICATIONS: . antiseptic oral rinse  7 mL Mouth Rinse q12n4p  . aspirin EC  81 mg Oral Daily  . chlorhexidine  15 mL Mouth Rinse BID  . heparin  5,000 Units Subcutaneous 3 times per day  . insulin aspart  0-15 Units Subcutaneous TID WC  . insulin aspart  0-5 Units Subcutaneous QHS  . ipratropium-albuterol  3 mL Nebulization Q6H  . levothyroxine  100 mcg Oral QAC breakfast  . methylPREDNISolone (SOLU-MEDROL) injection  60 mg Intravenous Q6H  . piperacillin-tazobactam (ZOSYN)  IV  4.5 g Intravenous 3 times per day  . pravastatin  40 mg Oral QHS  . sodium chloride  3 mL Intravenous Q12H  . tamsulosin  0.4 mg Oral Daily  . vancomycin  1,750 mg Intravenous Q24H   Review of Systems - 11 systems reviewed and negative per HPI  OBJECTIVE: Temp:  [95.8 F (35.4 C)-98.4 F (36.9 C)] 98.1 F (36.7 C) (12/09 1400) Pulse Rate:  [59-89] 78 (12/09 1400) Resp:  [11-21]  17 (12/09 1400) BP: (91-122)/(43-86) 92/63 mmHg (12/09 1400) SpO2:  [83 %-100 %] 83 % (12/09 1400) FiO2 (%):  [36 %-55 %] 36 % (12/09 1433) Weight:  [171.6 kg (378 lb 5 oz)-172.1 kg (379 lb 6.6 oz)] 172.1 kg (379 lb 6.6 oz) (12/09 0602) Physical Exam  Constitutional:morbidly obese, ill appearing HENT: anicteric Mouth/Throat: Oropharynx is clear and dry. No oropharyngeal exudate.  Cardiovascular:  Reg, distant  Pulmonary/Chest: rhonchi bil Abdominal: distended, has pitting edema on abd wall  Lymphadenopathy: He has no cervical adenopathy.  Neurological: He is awake but confused Skin: peau d orange changes bil LE, L inner upper thigh with approx 5 cm irregular skin ulceration with some drainage. Min induration. Scrotum is red and inflamed, ulcertion posteriorly R heel ulceration Ext massive edema,   LABS: Results  for orders placed or performed during the hospital encounter of 08/27/2015 (from the past 48 hour(s))  Blood culture (routine x 2)     Status: None (Preliminary result)   Collection Time: 09/07/2015  4:09 PM  Result Value Ref Range   Specimen Description BLOOD RIGHT HAND    Special Requests Normal    Culture NO GROWTH < 24 HOURS    Report Status PENDING   Ammonia     Status: None   Collection Time: 09/03/2015  4:09 PM  Result Value Ref Range   Ammonia 21 9 - 35 umol/L    Comment: HEMOLYSIS AT THIS LEVEL MAY AFFECT RESULT  Lactic acid, plasma     Status: None   Collection Time: 09/06/2015  4:09 PM  Result Value Ref Range   Lactic Acid, Venous 2.0 0.5 - 2.0 mmol/L  Comprehensive metabolic panel     Status: Abnormal   Collection Time: 09/07/2015  4:32 PM  Result Value Ref Range   Sodium 129 (L) 135 - 145 mmol/L   Potassium 5.8 (H) 3.5 - 5.1 mmol/L   Chloride 95 (L) 101 - 111 mmol/L   CO2 24 22 - 32 mmol/L   Glucose, Bld 189 (H) 65 - 99 mg/dL   BUN 77 (H) 6 - 20 mg/dL   Creatinine, Ser 2.82 (H) 0.61 - 1.24 mg/dL   Calcium 8.4 (L) 8.9 - 10.3 mg/dL   Total Protein 7.2 6.5 - 8.1 g/dL   Albumin 2.3 (L) 3.5 - 5.0 g/dL   AST 28 15 - 41 U/L   ALT 11 (L) 17 - 63 U/L   Alkaline Phosphatase 173 (H) 38 - 126 U/L   Total Bilirubin 0.9 0.3 - 1.2 mg/dL   GFR calc non Af Amer 23 (L) >60 mL/min   GFR calc Af Amer 27 (L) >60 mL/min    Comment: (NOTE) The eGFR has been calculated using the CKD EPI equation. This calculation has not been validated in all clinical situations. eGFR's persistently <60 mL/min signify possible Chronic Kidney Disease.    Anion gap 10 5 - 15  Lipase, blood     Status: None   Collection Time: 09/10/2015  4:32 PM  Result Value Ref Range   Lipase 18 11 - 51 U/L  Blood culture (routine x 2)     Status: None (Preliminary result)   Collection Time: 09/05/2015  4:32 PM  Result Value Ref Range   Specimen Description BLOOD RIGHT HAND    Special Requests BOTTLES DRAWN AEROBIC AND  ANAEROBIC [place]    Culture NO GROWTH < 24 HOURS    Report Status PENDING   CBC with Differential     Status: Abnormal  Collection Time: 09/14/2015  4:32 PM  Result Value Ref Range   WBC 7.1 3.8 - 10.6 K/uL   RBC 3.77 (L) 4.40 - 5.90 MIL/uL   Hemoglobin 9.9 (L) 13.0 - 18.0 g/dL   HCT 31.6 (L) 40.0 - 52.0 %   MCV 83.8 80.0 - 100.0 fL   MCH 26.2 26.0 - 34.0 pg   MCHC 31.3 (L) 32.0 - 36.0 g/dL   RDW 19.4 (H) 11.5 - 14.5 %   Platelets 248 150 - 440 K/uL   Neutrophils Relative % 81 %   Neutro Abs 5.8 1.4 - 6.5 K/uL   Lymphocytes Relative 7 %   Lymphs Abs 0.5 (L) 1.0 - 3.6 K/uL   Monocytes Relative 10 %   Monocytes Absolute 0.7 0.2 - 1.0 K/uL   Eosinophils Relative 1 %   Eosinophils Absolute 0.1 0 - 0.7 K/uL   Basophils Relative 1 %   Basophils Absolute 0.0 0 - 0.1 K/uL  Troponin I     Status: None   Collection Time: 09/06/2015  4:32 PM  Result Value Ref Range   Troponin I <0.03 <0.031 ng/mL    Comment:        NO INDICATION OF MYOCARDIAL INJURY.   APTT     Status: None   Collection Time: 09/13/2015  4:32 PM  Result Value Ref Range   aPTT 33 24 - 36 seconds  Protime-INR     Status: None   Collection Time: 09/16/2015  4:32 PM  Result Value Ref Range   Prothrombin Time 14.8 11.4 - 15.0 seconds   INR 1.14   Urinalysis complete, with microscopic ([facility] only)     Status: Abnormal   Collection Time: 09/11/2015  4:45 PM  Result Value Ref Range   Color, Urine YELLOW (A) YELLOW   APPearance CLOUDY (A) CLEAR   Glucose, UA NEGATIVE NEGATIVE mg/dL   Bilirubin Urine NEGATIVE NEGATIVE   Ketones, ur NEGATIVE NEGATIVE mg/dL   Specific Gravity, Urine 1.013 1.005 - 1.030   Hgb urine dipstick 1+ (A) NEGATIVE   pH 5.0 5.0 - 8.0   Protein, ur 100 (A) NEGATIVE mg/dL   Nitrite NEGATIVE NEGATIVE   Leukocytes, UA NEGATIVE NEGATIVE   RBC / HPF 0-5 0 - 5 RBC/hpf   WBC, UA 0-5 0 - 5 WBC/hpf   Bacteria, UA MANY (A) NONE SEEN   Squamous Epithelial / LPF 0-5 (A) NONE SEEN   Amorphous Crystal PRESENT    Urine culture     Status: None (Preliminary result)   Collection Time: 09/15/2015  4:45 PM  Result Value Ref Range   Specimen Description URINE, CATHETERIZED    Special Requests NONE    Culture NO GROWTH < 24 HOURS    Report Status PENDING   Blood gas, arterial     Status: Abnormal   Collection Time: 09/12/2015  5:15 PM  Result Value Ref Range   FIO2 36.00    Delivery systems NASAL CANNULA    pH, Arterial 7.29 (L) 7.350 - 7.450   pCO2 arterial 52 (H) 32.0 - 48.0 mmHg   pO2, Arterial 58 (L) 83.0 - 108.0 mmHg   Bicarbonate 25.0 21.0 - 28.0 mEq/L   Acid-base deficit 1.9 0.0 - 2.0 mmol/L   O2 Saturation 86.2 %   Patient temperature 37.0    Collection site RIGHT BRACHIAL    Sample type ARTERIAL DRAW    Allens test (pass/fail) POSITIVE (A) PASS  Lactic acid, plasma     Status: None  Collection Time: 09/08/2015  6:48 PM  Result Value Ref Range   Lactic Acid, Venous 1.7 0.5 - 2.0 mmol/L  Wound culture     Status: None (Preliminary result)   Collection Time: 09/05/2015 10:23 PM  Result Value Ref Range   Specimen Description SCROTUM    Special Requests NONE    Gram Stain PENDING    Culture NO GROWTH < 12 HOURS    Report Status PENDING   MRSA PCR Screening     Status: None   Collection Time: 08/28/2015 10:23 PM  Result Value Ref Range   MRSA by PCR NEGATIVE NEGATIVE    Comment:        The GeneXpert MRSA Assay (FDA approved for NASAL specimens only), is one component of a comprehensive MRSA colonization surveillance program. It is not intended to diagnose MRSA infection nor to guide or monitor treatment for MRSA infections.   Glucose, capillary     Status: Abnormal   Collection Time: 08/31/2015 10:30 PM  Result Value Ref Range   Glucose-Capillary 199 (H) 65 - 99 mg/dL  Hemoglobin A1c     Status: Abnormal   Collection Time: 09/17/2015 11:20 PM  Result Value Ref Range   Hgb A1c MFr Bld 7.2 (H) 4.0 - 6.0 %  Basic metabolic panel     Status: Abnormal   Collection Time: 09/02/2015 11:20  PM  Result Value Ref Range   Sodium 132 (L) 135 - 145 mmol/L   Potassium 5.9 (H) 3.5 - 5.1 mmol/L   Chloride 99 (L) 101 - 111 mmol/L   CO2 22 22 - 32 mmol/L   Glucose, Bld 194 (H) 65 - 99 mg/dL   BUN 81 (H) 6 - 20 mg/dL   Creatinine, Ser 2.86 (H) 0.61 - 1.24 mg/dL   Calcium 8.3 (L) 8.9 - 10.3 mg/dL   GFR calc non Af Amer 23 (L) >60 mL/min   GFR calc Af Amer 26 (L) >60 mL/min    Comment: (NOTE) The eGFR has been calculated using the CKD EPI equation. This calculation has not been validated in all clinical situations. eGFR's persistently <60 mL/min signify possible Chronic Kidney Disease.    Anion gap 11 5 - 15  Troponin I (q 6hr x 3)     Status: Abnormal   Collection Time: 09/14/2015 11:28 PM  Result Value Ref Range   Troponin I 0.04 (H) <0.031 ng/mL    Comment: READ BACK AND VERIFIED WITH MICHELLE WILLIAMS AT 0010 08/30/2015 BY TFK        PERSISTENTLY INCREASED TROPONIN VALUES IN THE RANGE OF 0.04-0.49 ng/mL CAN BE SEEN IN:       -UNSTABLE ANGINA       -CONGESTIVE HEART FAILURE       -MYOCARDITIS       -CHEST TRAUMA       -ARRYHTHMIAS       -LATE PRESENTING MYOCARDIAL INFARCTION       -COPD   CLINICAL FOLLOW-UP RECOMMENDED.   Basic metabolic panel     Status: Abnormal   Collection Time: 08/30/15  4:44 AM  Result Value Ref Range   Sodium 130 (L) 135 - 145 mmol/L   Potassium 6.2 (H) 3.5 - 5.1 mmol/L   Chloride 95 (L) 101 - 111 mmol/L   CO2 24 22 - 32 mmol/L   Glucose, Bld 224 (H) 65 - 99 mg/dL   BUN 79 (H) 6 - 20 mg/dL   Creatinine, Ser 2.82 (H) 0.61 - 1.24 mg/dL   Calcium  8.2 (L) 8.9 - 10.3 mg/dL   GFR calc non Af Amer 23 (L) >60 mL/min   GFR calc Af Amer 27 (L) >60 mL/min    Comment: (NOTE) The eGFR has been calculated using the CKD EPI equation. This calculation has not been validated in all clinical situations. eGFR's persistently <60 mL/min signify possible Chronic Kidney Disease.    Anion gap 11 5 - 15  CBC     Status: Abnormal   Collection Time: 08/30/15   4:44 AM  Result Value Ref Range   WBC 6.6 3.8 - 10.6 K/uL   RBC 3.70 (L) 4.40 - 5.90 MIL/uL   Hemoglobin 10.1 (L) 13.0 - 18.0 g/dL   HCT 30.8 (L) 40.0 - 52.0 %   MCV 83.3 80.0 - 100.0 fL   MCH 27.2 26.0 - 34.0 pg   MCHC 32.6 32.0 - 36.0 g/dL   RDW 19.1 (H) 11.5 - 14.5 %   Platelets 217 150 - 440 K/uL  Troponin I (q 6hr x 3)     Status: None   Collection Time: 08/30/15  4:44 AM  Result Value Ref Range   Troponin I <0.03 <0.031 ng/mL    Comment:        NO INDICATION OF MYOCARDIAL INJURY.   Glucose, capillary     Status: Abnormal   Collection Time: 08/30/15  7:30 AM  Result Value Ref Range   Glucose-Capillary 226 (H) 65 - 99 mg/dL  Glucose, capillary     Status: Abnormal   Collection Time: 08/30/15 12:03 PM  Result Value Ref Range   Glucose-Capillary 212 (H) 65 - 99 mg/dL  Troponin I (q 6hr x 3)     Status: None   Collection Time: 08/30/15 12:07 PM  Result Value Ref Range   Troponin I <0.03 <0.031 ng/mL    Comment:        NO INDICATION OF MYOCARDIAL INJURY.   Basic metabolic panel     Status: Abnormal   Collection Time: 08/30/15 12:07 PM  Result Value Ref Range   Sodium 130 (L) 135 - 145 mmol/L   Potassium 6.1 (H) 3.5 - 5.1 mmol/L   Chloride 95 (L) 101 - 111 mmol/L   CO2 24 22 - 32 mmol/L   Glucose, Bld 218 (H) 65 - 99 mg/dL   BUN 80 (H) 6 - 20 mg/dL   Creatinine, Ser 3.05 (H) 0.61 - 1.24 mg/dL   Calcium 8.2 (L) 8.9 - 10.3 mg/dL   GFR calc non Af Amer 21 (L) >60 mL/min   GFR calc Af Amer 24 (L) >60 mL/min    Comment: (NOTE) The eGFR has been calculated using the CKD EPI equation. This calculation has not been validated in all clinical situations. eGFR's persistently <60 mL/min signify possible Chronic Kidney Disease.    Anion gap 11 5 - 15   No components found for: ESR, C REACTIVE PROTEIN MICRO: Recent Results (from the past 720 hour(s))  Blood culture (routine x 2)     Status: None (Preliminary result)   Collection Time: 08/28/2015  4:09 PM  Result Value Ref  Range Status   Specimen Description BLOOD RIGHT HAND  Final   Special Requests Normal  Final   Culture NO GROWTH < 24 HOURS  Final   Report Status PENDING  Incomplete  Blood culture (routine x 2)     Status: None (Preliminary result)   Collection Time: 08/25/2015  4:32 PM  Result Value Ref Range Status   Specimen Description BLOOD  RIGHT HAND  Final   Special Requests BOTTLES DRAWN AEROBIC AND ANAEROBIC [place]  Final   Culture NO GROWTH < 24 HOURS  Final   Report Status PENDING  Incomplete  Urine culture     Status: None (Preliminary result)   Collection Time: 09/17/2015  4:45 PM  Result Value Ref Range Status   Specimen Description URINE, CATHETERIZED  Final   Special Requests NONE  Final   Culture NO GROWTH < 24 HOURS  Final   Report Status PENDING  Incomplete  Wound culture     Status: None (Preliminary result)   Collection Time: 08/23/2015 10:23 PM  Result Value Ref Range Status   Specimen Description SCROTUM  Final   Special Requests NONE  Final   Gram Stain PENDING  Incomplete   Culture NO GROWTH < 12 HOURS  Final   Report Status PENDING  Incomplete  MRSA PCR Screening     Status: None   Collection Time: 08/26/2015 10:23 PM  Result Value Ref Range Status   MRSA by PCR NEGATIVE NEGATIVE Final    Comment:        The GeneXpert MRSA Assay (FDA approved for NASAL specimens only), is one component of a comprehensive MRSA colonization surveillance program. It is not intended to diagnose MRSA infection nor to guide or monitor treatment for MRSA infections.     IMAGING: Dg Chest Portable 1 View  08/26/2015  CLINICAL DATA:  Shortness of Breath, weakness EXAM: PORTABLE CHEST 1 VIEW COMPARISON:  10/20/ 16 FINDINGS: Cardiomegaly again noted. Persistent mild congestion/ pulmonary edema. Again noted small left pleural effusion left basilar atelectasis or infiltrate. IMPRESSION: Persistent mild congestion/ pulmonary edema. Small left pleural effusion left basilar atelectasis or infiltrate.  Electronically Signed   By: Lahoma Crocker M.D.   On: 09/14/2015 16:03    Assessment:   Tyler [name] is a 59 y.o. male with with known history of COPD on 2.5 L, NYHA class III chronic diastolic heart failure, pulmonary hypertension, obstructive sleep apnea intolerant of CPAP, history of DVTs and PEs, CK D3 and recent nonhealing wounds of the lower extremities admitted with falls and confusion.  He was noted to have LE redness and wound on scrotum and L upper thigh.   BCX neg, wound cx pending.   Recommendations Will need diuresis and wound care. Continue vanco and zosyn but if wound cx is negative would change to unasyn to cover upper thigh wound, LE cellulitis and scrotal cellulitis  Thank you very much for allowing me to participate in the care of this patient. Please call with questions.   Cheral Marker. Ola Spurr, MD

## 2015-08-30 NOTE — Progress Notes (Signed)
[facility]  ROUNDING NOTE   Subjective:   Potassium 6.2 (5.9) Creatinine 2.86 (2.82)  UOP 450  Off bipap. Breathing [place]  Furosemide  IV x one given last night.   Objective:  Vital signs in last 24 hours:  Temp:  [95.8 F (35.4 C)-98.4 F (36.9 C)] 97.7 F (36.5 C) (12/09 0800) Pulse Rate:  [59-89] 66 (12/09 0800) Resp:  [11-21] 18 (12/09 0800) BP: (91-122)/(60-84) 104/64 mmHg (12/09 0800) SpO2:  [84 %-100 %] 90 % (12/09 0800) FiO2 (%):  [50 %-55 %] 50 % (12/09 0240) Weight:  [171.6 kg (378 lb 5 oz)-172.1 kg (379 lb 6.6 oz)] 172.1 kg (379 lb 6.6 oz) (12/09 0602)  Weight change:  Filed Weights   September 13, 2015 1525 08/30/15 0602  Weight: 171.6 kg (378 lb 5 oz) 172.1 kg (379 lb 6.6 oz)    Intake/Output: I/O last 3 completed shifts: In: 800 [I.V.:400; IV Piggyback:400] Out: 450 [Urine:450]   Intake/Output this shift:     Physical Exam: General: Critically ill  Head: Moist oral mucosal membranes  Eyes: Eyes open, PERRL  Neck: Supple, trachea midline  Lungs:  Bilateral wheezes, [place]   Heart: Regular rate and rhythm  Abdomen:  Soft, nontender, obese, abdominal wall edema  Extremities: 3+ peripheral edema.  Neurologic: Alert and oriented  Skin: Erythema on lower extremities, right heel wound  GU Scrotal edema    Basic Metabolic Panel:  Recent Labs Lab 09/13/2015 1632 September 13, 2015 2320 08/30/15 0444  NA 129* 132* 130*  K 5.8* 5.9* 6.2*  CL 95* 99* 95*  CO2 GLUCOSE 189* 194* 224*  BUN 77* 81* 79*  CREATININE 2.82* 2.86* 2.82*  CALCIUM 8.4* 8.3* 8.2*    Liver Function Tests:  Recent Labs Lab 2015/09/13 1632  AST 28  ALT 11*  ALKPHOS 173*  BILITOT 0.9  PROT 7.2  ALBUMIN 2.3*    Recent Labs Lab 13-Sep-2015 1632  LIPASE 18    Recent Labs Lab 13-Sep-2015 1609  AMMONIA 21    CBC:  Recent Labs Lab 09-13-2015 1632 08/30/15 0444  WBC 7.1 6.6  NEUTROABS 5.8  --   HGB 9.9* 10.1*  HCT 31.6* 30.8*  MCV 83.8 83.3  PLT 248 217     Cardiac Enzymes:  Recent Labs Lab Sep 13, 2015 1632 09/13/15 2328 08/30/15 0444  TROPONINI <0.03 0.04* <0.03    BNP: Invalid input(s): POCBNP  CBG:  Recent Labs Lab 13-Sep-2015 2230 08/30/15 0730  GLUCAP 199* 226*    Microbiology: Results for orders placed or performed during the hospital encounter of 2015/09/13  Blood culture (routine x 2)     Status: None (Preliminary result)   Collection Time: 09/13/15  4:09 PM  Result Value Ref Range Status   Specimen Description BLOOD RIGHT HAND  Final   Special Requests Normal  Final   Culture NO GROWTH < 24 HOURS  Final   Report Status PENDING  Incomplete  Blood culture (routine x 2)     Status: None (Preliminary result)   Collection Time: 13-Sep-2015  4:32 PM  Result Value Ref Range Status   Specimen Description BLOOD RIGHT HAND  Final   Special Requests BOTTLES DRAWN AEROBIC AND ANAEROBIC 7CC  Final   Culture NO GROWTH < 24 HOURS  Final   Report Status PENDING  Incomplete  MRSA PCR Screening     Status: None   Collection Time: 09/13/2015 10:23 PM  Result Value Ref Range Status   MRSA by PCR NEGATIVE NEGATIVE Final  Comment:        The GeneXpert MRSA Assay (FDA approved for NASAL specimens only), is one component of a comprehensive MRSA colonization surveillance program. It is not intended to diagnose MRSA infection nor to guide or monitor treatment for MRSA infections.     Coagulation Studies:  Recent Labs  08/27/2015 1632  LABPROT 14.8  INR 1.14    Urinalysis:  Recent Labs  09/17/2015 1645  COLORURINE YELLOW*  LABSPEC 1.013  PHURINE 5.0  GLUCOSEU NEGATIVE  HGBUR 1+*  BILIRUBINUR NEGATIVE  KETONESUR NEGATIVE  PROTEINUR 100*  NITRITE NEGATIVE  LEUKOCYTESUR NEGATIVE      Imaging: Dg Chest Portable 1 View  09/18/2015  CLINICAL DATA:  Shortness of Breath, weakness EXAM: PORTABLE CHEST 1 VIEW COMPARISON:  10/20/ 16 FINDINGS: Cardiomegaly again noted. Persistent mild congestion/ pulmonary edema. Again noted  small left pleural effusion left basilar atelectasis or infiltrate. IMPRESSION: Persistent mild congestion/ pulmonary edema. Small left pleural effusion left basilar atelectasis or infiltrate. Electronically Signed   By: Natasha MeadLiviu  Pop M.D.   On: 09/03/2015 16:03     Medications:     . antiseptic oral rinse  7 mL Mouth Rinse q12n4p  . aspirin EC  81 mg Oral Daily  . chlorhexidine  15 mL Mouth Rinse BID  . furosemide  40 mg Intravenous Once  . heparin  5,000 Units Subcutaneous 3 times per day  . insulin aspart  0-15 Units Subcutaneous TID WC  . insulin aspart  0-5 Units Subcutaneous QHS  . ipratropium-albuterol  3 mL Nebulization Q6H  . levothyroxine  100 mcg Oral QAC breakfast  . methylPREDNISolone (SOLU-MEDROL) injection  60 mg Intravenous Q6H  . piperacillin-tazobactam (ZOSYN)  IV  4.5 g Intravenous 3 times per day  . pravastatin  40 mg Oral QHS  . sodium chloride  3 mL Intravenous Q12H  . tamsulosin  0.4 mg Oral Daily  . vancomycin  1,750 mg Intravenous Q24H     Assessment/ Plan:  [name] is a 59 y.o. white  male with bilateral pulmonary emboli, DVTs, hypercoagulable disorder, varicose veins, morbid obesity,  chronic congestive heart failure, obstructive sleep apnea, diabetes type 2 with nephropathy retinopathy, neuropathy, atrial fibrillation, chronic lower back pain, admitted on 09/12/2015 for Anasarca [R60.1] Acute on chronic renal failure (HCC) [N17.9, N18.9] Hypothermia, initial encounter [T68.XXXA] Acute on chronic respiratory failure with hypoxia (HCC) [J96.21] Multiple wounds of skin [R23.8] Sepsis, due to unspecified organism (HCC) [A41.9] Morbid obesity, unspecified obesity type (HCC) [E66.01]  1. Acute renal failure on chronic kidney disease stage III with hyperkalemia and metabolic acidosis: Potassium can be related to patient's spironolactone. Higher this morning, status post shifting Will need repeat potassium level. May benefit from a furosemide  gtt Patient's acute renal failure can be due to acute cardiorenal syndrome.  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  - Renally dose all medications, monitor volume status and electrolytes closely. No acute indication for dialysis.   2. Acute Respiratory Failure: acute exacerbation of COPD, obstructive sleep apnea/[place]ian disorder, and acute exacerbation of diastolic congestive heart failure. Patient is fluid overloaded on top of his chronic lymphedema and anasarca  - Placed on BiPap overnight, now transitioned to [place] - Steroids, empiric antibiotics, nebs and pulmonary consult.  - Diuretics: furosemide 40mg  IV times one now  3. Hyponatremia: due to volume overload and congestive heart failure.   4. Anemia of chronic kidney disease: hemoglobin 10.1. No epo in the past.   5. Diabetes mellitus type II with  chronic kidney disease: Hemoglobin A1c 7.2% - continue glucose control.     LOS: 1 Lyndsie Wallman 12/9/20168:27 AM

## 2015-08-30 NOTE — Consult Note (Cosign Needed)
Palliative Medicine Inpatient Consult Note   Name: Tyler [name] Date: 08/30/2015 MRN: [id number]  DOB: 1955/12/19  Referring Physician: Nicholes Mango, MD  Palliative Care consult requested for this 59 y.o. male for goals of medical therapy in patient with history of bilateral pulmonary emboli, DVTs, hypercoagulable disorder, varicose veins, morbid obesity, chronic congestive heart failure, obstructive sleep apnea, diabetes type 2 with nephropathy retinopathy, neuropathy, atrial fibrillation, chronic lower back pain, admitted on 09/14/2015 for syncope, hypothermia, and hypoxic respiratory failure.  A wound care nurse had gone to his home and noted him to be much weaker than baseline. He fell twice earlier this week.  He is minimally active and sits in a chair all day and had been unable to stand for 2 days prior to coming in.  He has wounds on sacrum and buttocks and these have been worsening per report. His oxygen needs increased at home to 5 LPM via nasal cannula and he has gained at least 10 pounds recently.    TODAY'S DISCUSSIONS AND DECISIONS:  Pt is unable to participate in discussions due to confusion.  I met with pt's wife and her daughter. The pts son had already left.   I reviewed all the many problems the patient has and allowed the pts wife to go over some of the recent events and some things her husband has told her.  He apparently had stated that he would want [place] to 'try' to bring him back if his heart and breathing stops but he 'does not want to be on machines'.  Daughter brought up the fact that 'he is tired' and this led to a general sense that DNR would be a better option than Full Code and this might be more in line with his wishes than his last statement to her (since he would be on a vent which is a machine).  The wife feels she needs to talk further with their son before opting for DNR.  I sensed that she was otherwise ready to choose DNR status, but she could not choose  this w/o talking with the son.   Pt's prognosis is horribly grim for long term survival.  I brought up HOSPICE as an option.  Wife stated that 'if he wants to go home, then that is where we will have Hospice come'. She is familiar with Hospice and is in agreement that this would be better for him than Home Health. She does not feel that therapy attempts have helped him recently and she would like Hospice if he survives this hospital stay.  Today, he would be an appropriate Hospice House pt ---but wife says she wants [place] to try to make him better first and then after he is stabilized or better, --if he wants to go home, she wants him home --with Hospice.  They have a very small trailer --but she says that they can make room for a hospital bed when the time comes.    Of course, the unknown factor is her discussions with their son.  We don't know what his input and recommendations will be today.  A side note is that wife wants his thyroid lab(s) checked -- (we had gone over each of his problems and the status of each problem).  She wants the 'latest' on all his chronic conditions and understands it 'isn't just this condition or that one --but ALL of them put together that make it hard for [place] to be able to 'fix' his heart or kidneys or  skin and all of these problems also give him a very grim prognosis.    IMPRESSION:.  1.  Sepsis and septic shock---likely due to cellulitis and skin infection.   ---associated with hypothermia and hypotension and treated with fluids and empiric ABX  ---cxs pending 2.  Acute on chronic hypoxic respiratory failure  ---due to advanced COPD  ---on BIPAP last night  ---normally on 2 LPM of O2 but recently this was increased to 5 LPM.   3.  Acute on Chronic Diastolic Heart Failure ---pumonary edema seen on adm CXR and wt gain reported at adm ---NYHA class III ---contributed to resp flre 4.  Morbid Obesity ---prevented CT scan of chest due to his girth 5.  Acute renal  failure on chronic kidney disease stage III  ---with associated Metabolic Acidosis ---Baseline Cr 1.7 and is 2.8 for last 2 days ---urine output is decreased ---now being diuresed by nephrology 6.  Anemia of chronic disease ---High RDW is noted ---Hgb 10/1 7.  DM2 with Hgb A1C 7.2 8.  Hyperkalemia with K 6.2 this am ---was on spironolactone now DCd 9.   Hyponatremia due to CHF and fluid overload 10.  Elevated troponins --mild and due to demand ischemia (not MI) 11. Hypoalbuminemia  ---alb is 2.3 as of 08/23/2015 12.  Past Hypothyroidism with Abnl TSH in Sept (14.85)  ---not repeated as yet 13.  Decubiti ---large wound to scrotum involving right upper thigh is weeping purulent drainage ---wound to right anterior thigh and wound to right heel ---al present on admission 14.  Petechiae right shoulder 15.  Esstl HTN 16.  Pulmonary HTN 17.  H/O DVTs and PEs ---H/O Vena Cava filter placement ---Not anticoagulated this adm 18.  Ongoing tobacco smoking of cigars 1-3 per day for 3-4 yrs (former cigarettes smoker) 19.  Dyslipidemia 20.  Atrial Fibrillation --rate controlled ---new this adm (cardiology consulted) 21.  Metabolic Encephalopathy from illness  -----------------------------------------------------------------------------------------------------------  REVIEW OF SYSTEMS:  Patient is not able to provide ROS due to confusion  SPIRITUAL SUPPORT SYSTEM:   SOCIAL HISTORY:  reports that he has been smoking Cigars.  He has never used smokeless tobacco. He reports that he does not drink alcohol or use illicit drugs.  He is married and has two adult children.  He and his wife live in a very small trailer per wife's report.  He has [employer] Medicare (at age 59) but I do not see Medicaid listed.    They live in [place].    LEGAL DOCUMENTS:  none  CODE STATUS: Full code  PAST MEDICAL HISTORY: Past Medical History  Diagnosis Date  . Cataract   . CHF (congestive heart failure) ([place])    . COPD (chronic obstructive pulmonary disease) ([place])   . Diabetes mellitus without complication ([place])   . Sleep apnea   . Oxygen deficiency   . Thyroid disease   . Gout   . Hypothyroidism   . CKD (chronic kidney disease) stage 3, GFR 30-59 ml/min   . Asthma   . Hypertension     PAST SURGICAL HISTORY:  Past Surgical History  Procedure Laterality Date  . Cholecystectomy      2002  . Eye surgery      right cataract removed 3/21  . Vena cava filter placement      ALLERGIES:  is allergic to enalapril; atorvastatin; and lovastatin.  MEDICATIONS:  Current Facility-Administered Medications  Medication Dose Route Frequency Provider Last Rate Last Dose  . acetaminophen (TYLENOL) tablet 650 mg  650 mg Oral Q6H PRN Aldean Jewett, MD   650 mg at 08/30/15 6789   Or  . acetaminophen (TYLENOL) suppository 650 mg  650 mg Rectal Q6H PRN Aldean Jewett, MD      . alum & mag hydroxide-simeth (MAALOX/MYLANTA) 200-200-20 MG/5ML suspension 30 mL  30 mL Oral Q4H PRN Aldean Jewett, MD      . antiseptic oral rinse (CPC / CETYLPYRIDINIUM CHLORIDE 0.05%) solution 7 mL  7 mL Mouth Rinse q12n4p Aldean Jewett, MD      . aspirin EC tablet 81 mg  81 mg Oral Daily Aldean Jewett, MD   81 mg at 09/08/2015 2115  . chlorhexidine (PERIDEX) 0.12 % solution 15 mL  15 mL Mouth Rinse BID Aldean Jewett, MD      . furosemide (LASIX) injection 40 mg  40 mg Intravenous Once Lavonia Dana, MD      . heparin injection 5,000 Units  5,000 Units Subcutaneous 3 times per day Aldean Jewett, MD   5,000 Units at 08/30/15 [phone]  . insulin aspart (novoLOG) injection 0-15 Units  0-15 Units Subcutaneous TID WC Aldean Jewett, MD   5 Units at 08/30/15 [phone]  . insulin aspart (novoLOG) injection 0-5 Units  0-5 Units Subcutaneous QHS Aldean Jewett, MD   0 Units at 08/26/2015 2200  . ipratropium-albuterol (DUONEB) 0.5-2.5 (3) MG/3ML nebulizer solution 3 mL  3 mL Nebulization Q6H Aldean Jewett, MD   3 mL at  08/30/15 0755  . levothyroxine (SYNTHROID, LEVOTHROID) tablet 100 mcg  100 mcg Oral QAC breakfast Aldean Jewett, MD   100 mcg at 08/30/15 0258  . methylPREDNISolone sodium succinate (SOLU-MEDROL) 125 mg/2 mL injection 60 mg  60 mg Intravenous Q6H Aldean Jewett, MD   60 mg at 08/30/15 0813  . ondansetron (ZOFRAN) tablet 4 mg  4 mg Oral Q6H PRN Aldean Jewett, MD       Or  . ondansetron [facility]) injection 4 mg  4 mg Intravenous Q6H PRN Aldean Jewett, MD      . piperacillin-tazobactam (ZOSYN) IVPB 4.5 g  4.5 g Intravenous 3 times per day Aldean Jewett, MD   4.5 g at 08/30/15 0618  . polyethylene glycol (MIRALAX / GLYCOLAX) packet 17 g  17 g Oral Daily PRN Aldean Jewett, MD      . pravastatin (PRAVACHOL) tablet 40 mg  40 mg Oral QHS Aldean Jewett, MD   40 mg at 09/10/2015 2200  . sodium chloride 0.9 % injection 3 mL  3 mL Intravenous Q12H Aldean Jewett, MD   3 mL at 09/18/2015 2230  . tamsulosin (FLOMAX) capsule 0.4 mg  0.4 mg Oral Daily Aldean Jewett, MD   0.4 mg at 09/06/2015 2115  . vancomycin (VANCOCIN) 1,750 mg in sodium chloride 0.9 % 500 mL IVPB  1,750 mg Intravenous Q24H Aldean Jewett, MD   1,750 mg at 08/30/15 0851    Vital Signs: BP 104/64 mmHg  Pulse 66  Temp(Src) 97.7 F (36.5 C) (Axillary)  Resp 18  Ht 6' (1.829 m)  Wt 172.1 kg (379 lb 6.6 oz)  BMI 51.45 kg/m2  SpO2 90% Filed Weights   09/09/2015 1525 08/30/15 0602  Weight: 171.6 kg (378 lb 5 oz) 172.1 kg (379 lb 6.6 oz)    Estimated body mass index is 51.45 kg/(m^2) as calculated from the following:   Height as of this encounter: 6' (1.829 m).   Weight  as of this encounter: 172.1 kg (379 lb 6.6 oz).  PERFORMANCE STATUS (ECOG) : 4 - Bedbound  PHYSICAL EXAM: Lying on side in ICU  Confused Massively obese abdomen is noted Very tense anasarca noted in all extremeties Eyes open but he isn't interacting OP wnl Neck with no JVD or TM seen Hrt rrr with irreg beats  Lungs with decreased BS  bases Abd morbidly obest with positive BS GU --I attempted to examine his scrotum --and see that anteriorly, it appears nl, but the decubitus is posterior and reportedly involves his right inner thigh and had pus coming out of it at admission.  I could not pull legs into position to view this wound area.  This would take a number of staff members to accomplish. Will try later. Skin --he is reported to have a number of areas of breakdown --will have to view when more help is available Ext--his legs are massively edematous with 4plus edema to and including trunk.  Edema is quite tense.  He also has scarring from old 'sores or furuncles' and he has a resolving furuncle on upper right thigh.    LABS: CBC:    Component Value Date/Time   WBC 6.6 08/30/2015 0444   WBC 5.8 10/29/2014 0815   HGB 10.1* 08/30/2015 0444   HGB 12.9* 10/29/2014 0815   HCT 30.8* 08/30/2015 0444   HCT 40.0 10/29/2014 0815   PLT 217 08/30/2015 0444   PLT 221 10/29/2014 0815   MCV 83.3 08/30/2015 0444   MCV 86 10/29/2014 0815   NEUTROABS 5.8 09/02/2015 1632   NEUTROABS 4.0 10/29/2014 0815   LYMPHSABS 0.5* 09/15/2015 1632   LYMPHSABS 1.1 10/29/2014 0815   MONOABS 0.7 09/11/2015 1632   MONOABS 0.5 10/29/2014 0815   EOSABS 0.1 08/30/2015 1632   EOSABS 0.1 10/29/2014 0815   BASOSABS 0.0 08/23/2015 1632   BASOSABS 0.1 10/29/2014 0815   Comprehensive Metabolic Panel:    Component Value Date/Time   NA 130* 08/30/2015 0444   NA 136 08/21/2014 1613   K 6.2* 08/30/2015 0444   K 3.7 08/21/2014 1613   CL 95* 08/30/2015 0444   CL 98 08/21/2014 1613   CO2 24 08/30/2015 0444   CO2 30 08/21/2014 1613   BUN 79* 08/30/2015 0444   BUN 25* 08/21/2014 1613   CREATININE 2.82* 08/30/2015 0444   CREATININE 1.73* 08/21/2014 1613   GLUCOSE 224* 08/30/2015 0444   GLUCOSE 215* 08/21/2014 1613   CALCIUM 8.2* 08/30/2015 0444   CALCIUM 8.5 08/21/2014 1613   AST 28 09/17/2015 1632   AST 25 08/21/2014 1613   ALT 11* 08/26/2015 1632    ALT 13* 08/21/2014 1613   ALKPHOS 173* 09/03/2015 1632   ALKPHOS 330* 08/21/2014 1613   BILITOT 0.9 09/18/2015 1632   BILITOT 1.2* 08/21/2014 1613   PROT 7.2 08/26/2015 1632   PROT 7.2 08/21/2014 1613   ALBUMIN 2.3* 08/23/2015 1632   ALBUMIN 2.8* 08/21/2014 1613    More than 50% of the visit was spent in counseling/coordination of care: Yes  Time Spent: 80 minutes

## 2015-08-30 NOTE — Progress Notes (Signed)
ANTIBIOTIC CONSULT NOTE - INITIAL  Pharmacy Consult for Vancomycin/Zosyn  Indication: rule out sepsis  Allergies  Allergen Reactions  . Enalapril Cough  . Atorvastatin Palpitations  . Lovastatin Palpitations    Patient Measurements: Height: 6' (182.9 cm) Weight: (!) 379 lb 6.6 oz (172.1 kg) IBW/kg (Calculated) : 77.6 Adjusted Body Weight: 115.2 kg   Vital Signs: Temp: 98.1 F (36.7 C) (12/09 1400) BP: 92/63 mmHg (12/09 1400) Pulse Rate: 78 (12/09 1400) Intake/Output from previous day: 12/08 0701 - 12/09 0700 In: 800 [I.V.:400; IV Piggyback:400] Out: 450 [Urine:450] Intake/Output from this shift:    Labs:  Recent Labs  09/12/2015 1632 09/18/2015 2320 08/30/15 0444 08/30/15 1207  WBC 7.1  --  6.6  --   HGB 9.9*  --  10.1*  --   PLT 248  --  217  --   CREATININE 2.82* 2.86* 2.82* 3.05*   Estimated Creatinine Clearance: 42.6 mL/min (by C-G formula based on Cr of 3.05). No results for input(s): VANCOTROUGH, VANCOPEAK, VANCORANDOM, GENTTROUGH, GENTPEAK, GENTRANDOM, TOBRATROUGH, TOBRAPEAK, TOBRARND, AMIKACINPEAK, AMIKACINTROU, AMIKACIN in the last 72 hours.   Microbiology: Recent Results (from the past 720 hour(s))  Blood culture (routine x 2)     Status: None (Preliminary result)   Collection Time: 09/11/2015  4:09 PM  Result Value Ref Range Status   Specimen Description BLOOD RIGHT HAND  Final   Special Requests Normal  Final   Culture NO GROWTH < 24 HOURS  Final   Report Status PENDING  Incomplete  Blood culture (routine x 2)     Status: None (Preliminary result)   Collection Time: 09/19/2015  4:32 PM  Result Value Ref Range Status   Specimen Description BLOOD RIGHT HAND  Final   Special Requests BOTTLES DRAWN AEROBIC AND ANAEROBIC 7CC  Final   Culture NO GROWTH < 24 HOURS  Final   Report Status PENDING  Incomplete  Urine culture     Status: None (Preliminary result)   Collection Time: 08/31/2015  4:45 PM  Result Value Ref Range Status   Specimen Description URINE,  CATHETERIZED  Final   Special Requests NONE  Final   Culture NO GROWTH < 24 HOURS  Final   Report Status PENDING  Incomplete  Wound culture     Status: None (Preliminary result)   Collection Time: 08/24/2015 10:23 PM  Result Value Ref Range Status   Specimen Description SCROTUM  Final   Special Requests NONE  Final   Gram Stain PENDING  Incomplete   Culture NO GROWTH < 12 HOURS  Final   Report Status PENDING  Incomplete  MRSA PCR Screening     Status: None   Collection Time: 08/28/2015 10:23 PM  Result Value Ref Range Status   MRSA by PCR NEGATIVE NEGATIVE Final    Comment:        The GeneXpert MRSA Assay (FDA approved for NASAL specimens only), is one component of a comprehensive MRSA colonization surveillance program. It is not intended to diagnose MRSA infection nor to guide or monitor treatment for MRSA infections.     Medical History: Past Medical History  Diagnosis Date  . Cataract   . CHF (congestive heart failure) ([facility])   . COPD (chronic obstructive pulmonary disease) ([facility])   . Diabetes mellitus without complication ([facility])   . Sleep apnea   . Oxygen deficiency   . Thyroid disease   . Gout   . Hypothyroidism   . CKD (chronic kidney disease) stage 3, GFR 30-59 ml/min   .  Asthma   . Hypertension     Medications:  Prescriptions prior to admission  Medication Sig Dispense Refill Last Dose  . acetaminophen (TYLENOL) 500 MG tablet Take 1,000 mg by mouth every 6 (six) hours as needed for mild pain or headache.   PRN  . ammonium lactate (LAC-HYDRIN) 12 % lotion Apply topically as needed for dry skin. (Patient taking differently: Apply 1 application topically as needed for dry skin. ) 400 g 0 PRN  . aspirin EC 81 MG EC tablet Take 1 tablet (81 mg total) by mouth daily. 100 tablet 0 09/12/2015 at am  . cefUROXime (CEFTIN) 500 MG tablet Take 500 mg by mouth 2 (two) times daily with a meal. For 10 days   08/23/2015 at am  . colchicine 0.6 MG tablet Take 0.6 mg by mouth daily  as needed (for gout flares).    PRN  . cyanocobalamin (,VITAMIN B-12,) 1000 MCG/ML injection Inject 1,000 mcg into the muscle every 30 (thirty) days.   Past Month  . cyanocobalamin 2000 MCG tablet Take 2,000 mcg by mouth daily.   09/20/2015 at am  . docusate sodium (COLACE) 100 MG capsule Take 1 capsule (100 mg total) by mouth 2 (two) times daily. (Patient taking differently: Take 100 mg by mouth daily. ) 10 capsule 0 09/09/2015 at am  . fluticasone (FLONASE) 50 MCG/ACT nasal spray Place 2 sprays into both nostrils daily as needed for rhinitis.   PRN  . furosemide (LASIX) 40 MG tablet Take 1 tablet (40 mg total) by mouth 2 (two) times daily. 60 tablet 5 09/18/2015 at am  . gabapentin (NEURONTIN) 100 MG capsule Take 100 mg by mouth 2 (two) times daily.   09/09/2015 at am  . hydrOXYzine (ATARAX/VISTARIL) 25 MG tablet Take 25-50 mg by mouth 3 (three) times daily as needed for itching.   PRN  . insulin NPH Human (HUMULIN N,NOVOLIN N) 100 UNIT/ML injection Inject into the skin as needed (for high blood sugar). Pt states that he uses per sliding scale.   PRN  . insulin regular (NOVOLIN R,HUMULIN R) 100 units/mL injection Inject into the skin as needed for high blood sugar. Pt states that he uses per sliding scale.   PRN  . ipratropium-albuterol (DUONEB) 0.5-2.5 (3) MG/3ML SOLN Take 3 mLs by nebulization 2 (two) times daily.   09/17/2015 at am  . lactulose (CHRONULAC) 10 GM/15ML solution Take 20 g by mouth 2 (two) times daily as needed for mild constipation.    PRN  . levothyroxine (SYNTHROID, LEVOTHROID) 100 MCG tablet Take 100 mcg by mouth daily.   09/11/2015 at am  . ondansetron (ZOFRAN-ODT) 4 MG disintegrating tablet Take 4 mg by mouth every 8 (eight) hours as needed for nausea or vomiting.   PRN  . PARoxetine (PAXIL) 20 MG tablet Take 20 mg by mouth daily.   09/19/2015 at am  . polyethylene glycol (MIRALAX / GLYCOLAX) packet Take 17 g by mouth daily as needed for mild constipation.    PRN  . pravastatin  (PRAVACHOL) 40 MG tablet Take 40 mg by mouth at bedtime.   08/28/2015 at pm  . salmeterol (SEREVENT) 50 MCG/DOSE diskus inhaler Inhale 1 puff into the lungs 2 (two) times daily as needed (for shortness of breath).   PRN  . spironolactone (ALDACTONE) 25 MG tablet Take 25 mg by mouth 2 (two) times daily.   09/20/2015 at am  . tamsulosin (FLOMAX) 0.4 MG CAPS capsule Take 1 capsule (0.4 mg total) by mouth daily. 30  capsule 5 09/04/2015 at am  . albuterol (PROVENTIL) (2.5 MG/3ML) 0.083% nebulizer solution Take 3 mLs (2.5 mg total) by nebulization every 2 (two) hours as needed for wheezing. 75 mL 12 Taking  . alum & mag hydroxide-simeth (MAALOX/MYLANTA) 200-200-20 MG/5ML suspension Take 30 mLs by mouth every 4 (four) hours as needed for indigestion or heartburn. 355 mL 0 Taking  . cefUROXime (CEFTIN) 250 MG tablet Take 1 tablet (250 mg total) by mouth 2 (two) times daily with a meal. (Patient not taking: Reported on 08/26/2015) 20 tablet 0 Not Taking  . ondansetron (ZOFRAN) 4 MG tablet Take 1 tablet (4 mg total) by mouth every 6 (six) hours as needed for nausea. (Patient not taking: Reported on 07/22/2015) 20 tablet 0 Not Taking  . torsemide (DEMADEX) 100 MG tablet Take 50 mg by mouth daily.   Taking   Assessment: Pharmacy consulted to dose Vanc and Zosyn in this 59 year old male admitted with sepsis.  Goal of Therapy:  Vancomycin trough level 15-20 mcg/ml  Plan:  Expected duration 7 days with resolution of temperature and/or normalization of WBC   Continue Zosyn 4.5 gm IV Q8H EI ordered to start 12/09 @ 3:00.  Continue Vancomycin 1750 mg IV Q24H Trough ordered on 12/11 @ 6:30 to rule out accumulation, this will not be at [facility].   Luisa Hart D 08/30/2015,2:56 PM

## 2015-08-31 LAB — TSH: TSH: 15.487 u[IU]/mL — ABNORMAL HIGH (ref 0.350–4.500)

## 2015-08-31 LAB — CBC
HEMATOCRIT: 28.8 % — AB (ref 40.0–52.0)
HEMOGLOBIN: 9.4 g/dL — AB (ref 13.0–18.0)
MCH: 27.3 pg (ref 26.0–34.0)
MCHC: 32.8 g/dL (ref 32.0–36.0)
MCV: 83.2 fL (ref 80.0–100.0)
Platelets: 228 10*3/uL (ref 150–440)
RBC: 3.46 MIL/uL — ABNORMAL LOW (ref 4.40–5.90)
RDW: 18.9 % — AB (ref 11.5–14.5)
WBC: 7.4 10*3/uL (ref 3.8–10.6)

## 2015-08-31 LAB — COMPREHENSIVE METABOLIC PANEL
ALBUMIN: 2.2 g/dL — AB (ref 3.5–5.0)
ALT: 12 U/L — ABNORMAL LOW (ref 17–63)
ANION GAP: 10 (ref 5–15)
AST: 27 U/L (ref 15–41)
Alkaline Phosphatase: 148 U/L — ABNORMAL HIGH (ref 38–126)
BILIRUBIN TOTAL: 0.9 mg/dL (ref 0.3–1.2)
BUN: 86 mg/dL — AB (ref 6–20)
CO2: 25 mmol/L (ref 22–32)
Calcium: 8.2 mg/dL — ABNORMAL LOW (ref 8.9–10.3)
Chloride: 96 mmol/L — ABNORMAL LOW (ref 101–111)
Creatinine, Ser: 3.21 mg/dL — ABNORMAL HIGH (ref 0.61–1.24)
GFR calc Af Amer: 23 mL/min — ABNORMAL LOW (ref >60)
GFR calc non Af Amer: 20 mL/min — ABNORMAL LOW (ref >60)
GLUCOSE: 244 mg/dL — AB (ref 65–99)
POTASSIUM: 6.2 mmol/L — AB (ref 3.5–5.1)
SODIUM: 131 mmol/L — AB (ref 135–145)
TOTAL PROTEIN: 6.7 g/dL (ref 6.5–8.1)

## 2015-08-31 LAB — URINE CULTURE: CULTURE: NO GROWTH

## 2015-08-31 LAB — GLUCOSE, CAPILLARY
GLUCOSE-CAPILLARY: 256 mg/dL — AB (ref 65–99)
GLUCOSE-CAPILLARY: 248 mg/dL — AB (ref 65–99)
GLUCOSE-CAPILLARY: 238 mg/dL — AB (ref 65–99)
Glucose-Capillary: 226 mg/dL — ABNORMAL HIGH (ref 65–99)

## 2015-08-31 MED ORDER — POLYVINYL ALCOHOL 1.4 % OP SOLN
1.0000 [drp] | Freq: Four times a day (QID) | OPHTHALMIC | Status: DC | PRN
  Filled 2015-08-31: qty 15

## 2015-08-31 MED ORDER — HALOPERIDOL LACTATE 2 MG/ML PO CONC
0.5000 mg | ORAL | Status: DC | PRN
  Filled 2015-08-31: qty 0.3

## 2015-08-31 MED ORDER — HALOPERIDOL 0.5 MG PO TABS
0.5000 mg | ORAL_TABLET | ORAL | Status: DC | PRN
  Filled 2015-08-31: qty 1

## 2015-08-31 MED ORDER — GLYCOPYRROLATE 0.2 MG/ML IJ SOLN
0.2000 mg | INTRAMUSCULAR | Status: DC | PRN
  Filled 2015-08-31: qty 1

## 2015-08-31 MED ORDER — SODIUM CHLORIDE 0.9 % IV SOLN: 250.0000 mL | INTRAVENOUS | Status: DC | PRN

## 2015-08-31 MED ORDER — ONDANSETRON 4 MG PO TBDP
4.0000 mg | ORAL_TABLET | Freq: Four times a day (QID) | ORAL | Status: DC | PRN
  Filled 2015-08-31: qty 1

## 2015-08-31 MED ORDER — METHYLPREDNISOLONE SODIUM SUCC 125 MG IJ SOLR
INTRAMUSCULAR | Status: AC
  Administered 2015-08-31: 62.5 mg via INTRAMUSCULAR
  Filled 2015-08-31: qty 2

## 2015-08-31 MED ORDER — ACETAMINOPHEN 325 MG PO TABS: 650.0000 mg | ORAL_TABLET | Freq: Four times a day (QID) | ORAL | Status: DC | PRN

## 2015-08-31 MED ORDER — HALOPERIDOL LACTATE 5 MG/ML IJ SOLN: 0.5000 mg | INTRAMUSCULAR | Status: DC | PRN

## 2015-08-31 MED ORDER — SODIUM CHLORIDE 0.9 % IJ SOLN
3.0000 mL | INTRAMUSCULAR | Status: DC | PRN
  Administered 2015-09-01: 3 mL via INTRAVENOUS
  Filled 2015-08-31: qty 10

## 2015-08-31 MED ORDER — METHYLPREDNISOLONE SODIUM SUCC 40 MG IJ SOLR
40.0000 mg | Freq: Two times a day (BID) | INTRAMUSCULAR | Status: DC
  Administered 2015-08-31 – 2015-09-01 (×2): 40 mg via INTRAVENOUS
  Filled 2015-08-31 (×2): qty 1

## 2015-08-31 MED ORDER — ONDANSETRON HCL 4 MG/2ML IJ SOLN: 4.0000 mg | Freq: Four times a day (QID) | INTRAMUSCULAR | Status: DC | PRN

## 2015-08-31 MED ORDER — GLYCOPYRROLATE 1 MG PO TABS
1.0000 mg | ORAL_TABLET | ORAL | Status: DC | PRN
  Filled 2015-08-31: qty 1

## 2015-08-31 MED ORDER — ACETAMINOPHEN 650 MG RE SUPP: 650.0000 mg | Freq: Four times a day (QID) | RECTAL | Status: DC | PRN

## 2015-08-31 MED ORDER — MORPHINE SULFATE (PF) 2 MG/ML IV SOLN
2.0000 mg | INTRAVENOUS | Status: DC | PRN
  Administered 2015-09-01: 4 mg via INTRAVENOUS
  Administered 2015-09-01: 2 mg via INTRAVENOUS
  Administered 2015-09-01: 12:00:00 4 mg via INTRAVENOUS
  Filled 2015-08-31 (×2): qty 2
  Filled 2015-08-31: qty 1

## 2015-08-31 MED ORDER — SODIUM CHLORIDE 0.9 % IJ SOLN
3.0000 mL | Freq: Two times a day (BID) | INTRAMUSCULAR | Status: DC
  Administered 2015-08-31 – 2015-09-01 (×2): 3 mL via INTRAVENOUS

## 2015-08-31 MED ORDER — BIOTENE DRY MOUTH MT LIQD: 15.0000 mL | OROMUCOSAL | Status: DC | PRN

## 2015-08-31 NOTE — Progress Notes (Signed)
   08/31/15 1000  Clinical Encounter Type  Visited With Patient and family together  Visit Type Follow-up  Referral From Nurse  Consult/Referral To Chaplain  Spiritual Encounters  Spiritual Needs Emotional  Paged to room by patient request. Major life decisions need to be made. Will need continued emotional support. Spoke with patient, wife, and son. Patient too tired to talk at this point. Will follow-up later with family. Chaplain Cordelia PocheEmily Virl Coble

## 2015-08-31 NOTE — Progress Notes (Signed)
[facility] Cardiology  SUBJECTIVE: I don't have chest pain   Filed Vitals:   08/31/15 0700 08/31/15 0800 08/31/15 0900 08/31/15 1000  BP: 123/75 116/71 113/68 124/76  Pulse: 70 65 73 78  Temp: 97.9 F (36.6 C) 97.7 F (36.5 C) 97.2 F (36.2 C) 97 F (36.1 C)  TempSrc:  Rectal    Resp: Height:      Weight:      SpO2: 93% 96% 85% 87%     Intake/Output Summary (Last 24 hours) at 08/31/15 1033 Last data filed at 08/31/15 0602  Gross per 24 hour  Intake    940 ml  Output    800 ml  Net    140 ml      PHYSICAL EXAM  General: Acutely ill appearing patient HEENT:  Normocephalic and atramatic Neck:  No JVD.  Lungs: Clear bilaterally to auscultation and percussion. Heart: HRRR . Normal S1 and S2 without gallops or murmurs.  Abdomen: Bowel sounds are positive, abdomen soft and non-tender  Msk:  Back normal, normal gait. Normal strength and tone for age. Extremities: Nonhealing ulcer right foot Neuro: Alert and oriented X 3. Psych:  Good affect, responds appropriately   LABS: Basic Metabolic Panel:  Recent Labs  16/10/96 1207 08/30/15 1852 08/31/15 0530  NA 130*  --  131*  K 6.1* 6.1* 6.2*  CL 95*  --  96*  CO2 24  --  25  GLUCOSE 218*  --  244*  BUN 80*  --  86*  CREATININE 3.05*  --  3.21*  CALCIUM 8.2*  --  8.2*   Liver Function Tests:  Recent Labs  08/28/2015 1632 08/31/15 0530  AST 28 27  ALT 11* 12*  ALKPHOS 173* 148*  BILITOT 0.9 0.9  PROT 7.2 6.7  ALBUMIN 2.3* 2.2*    Recent Labs  08/28/2015 1632  LIPASE 18   CBC:  Recent Labs  08/22/2015 1632 08/30/15 0444 08/31/15 0530  WBC 7.1 6.6 7.4  NEUTROABS 5.8  --   --   HGB 9.9* 10.1* 9.4*  HCT 31.6* 30.8* 28.8*  MCV 83.8 83.3 83.2  PLT 248 217 228   Cardiac Enzymes:  Recent Labs  09/06/2015 2328 08/30/15 0444 08/30/15 1207  TROPONINI 0.04* <0.03 <0.03   BNP: Invalid input(s): POCBNP D-Dimer: No results for input(s): DDIMER in the last 72 hours. Hemoglobin A1C:  Recent  Labs  09/04/2015 2320  HGBA1C 7.2*   Fasting Lipid Panel: No results for input(s): CHOL, HDL, LDLCALC, TRIG, CHOLHDL, LDLDIRECT in the last 72 hours. Thyroid Function Tests:  Recent Labs  08/31/15 0530  TSH 15.487*   Anemia Panel: No results for input(s): VITAMINB12, FOLATE, FERRITIN, TIBC, IRON, RETICCTPCT in the last 72 hours.  Dg Chest Portable 1 View  09/18/2015  CLINICAL DATA:  Shortness of Breath, weakness EXAM: PORTABLE CHEST 1 VIEW COMPARISON:  10/20/ 16 FINDINGS: Cardiomegaly again noted. Persistent mild congestion/ pulmonary edema. Again noted small left pleural effusion left basilar atelectasis or infiltrate. IMPRESSION: Persistent mild congestion/ pulmonary edema. Small left pleural effusion left basilar atelectasis or infiltrate. Electronically Signed   By: Natasha Mead M.D.   On: 09/18/2015 16:03     Echo normal left ventricular function with LV ejection fraction of 50-55% with small to moderate chelated pericardial effusion  TELEMETRY: Sinus rhythm with intermittent atrial fibrillation:  ASSESSMENT AND PLAN:  Active Problems:   Sepsis (HCC)   Acute on chronic respiratory failure with hypoxia (H[facility])   Hypothermia  Multiple wounds of skin    1. Paroxysmal atrial fibrillation, rate controlled, asymptomatic, currently on aspirin for stroke prevention. The patient has chads 2 score of 2, with multiple comorbidities, including sepsis/cellulitis/kidney disease. At this point, recommend continuing aspirin, and defer chronic anticoagulation which will require warfarin, and constant monitoring with labile renal function. 2. Acute on chronic diastolic congestive heart failure, stable 3. Sepsis/cellulitis/nonhealing ulcer, now for comfort care 4. Acute on chronic renal failure, not felt to be a good candidate for chronic hemodialysis  Recommendations  1. Recurrent medications 2. Continue aspirin for stroke prevention. Defer chronic anticoagulation with warfarin. 3. Continue  Lasix as needed 4. Defer further cardiac diagnostics at this time  Signed off for now, please call if any questions  Sonita Michiels, MD, PhD, [facility] HospitalFACC 08/31/2015 10:33 AM

## 2015-08-31 NOTE — Progress Notes (Signed)
[facility] -  at [facility]   PATIENT NAME: Tyler [name]    MR#:  [id number]  DATE OF BIRTH:  Jul 09, 1956  SUBJECTIVE:  CHIEF COMPLAINT:  Patient is off BiPAP. Shortness of breath is better . Wife is at bedside.  Earlier today his wife decided to keep him as DO NOT RESUSCITATE, and not to go for hemodialysis, but try to keep him comfortable. Patient is alert but appears slightly confused.  REVIEW OF SYSTEMS:   Notable to give a review of system because of confusion.  DRUG ALLERGIES:   Allergies  Allergen Reactions  . Enalapril Cough  . Atorvastatin Palpitations  . Lovastatin Palpitations    VITALS:  Blood pressure 124/76, pulse 78, temperature 97 F (36.1 C), temperature source Rectal, resp. rate 17, height 6' (1.829 m), weight 168.738 kg (372 lb), SpO2 88 %.  PHYSICAL EXAMINATION:  GENERAL: 59 y.o.-year-old patient lying in the bed, off BiPAP, obese EYES: Pupils equal, round, reactive to light and accommodation.  HEENT: Head atraumatic, normocephalic. Oropharynx and nasopharynx clear. Mucous membranes dry  NECK: Supple, no jugular venous distention. No thyroid enlargement, no tenderness.  LUNGS:Coarse breath sounds with diffuse wheezing, fair air movement - on nasal canula oxygen. CARDIOVASCULAR: distant, S1, S2 normal. No murmurs, rubs, or gallops.  ABDOMEN: Soft, nontender, nondistended. Bowel sounds present. No organomegaly or mass. Guarding no rebound  EXTREMITIES:2+ pitting edema both lower extremities through the thigh and also over the abdomen, cyanosis, or clubbing.  NEUROLOGIC: Cranial nerves II through XII are grossly intact. globally weak but does move all 4 extremities.  PSYCHIATRIC: Awake and alert, oriented 1 SKIN: Erythema and induration over bilateral lower extremities to the mid lower leg, there is a healing wound over the right heel which is about 4 cm x 4 cm across, no purulent drainage from this wound,  some bleeding, granulation tissue is visible, there is also wound over the inner right buttock which is about 5 cm x 5 cm across, full skin thickness, purulent material is visible, an adjacent lesion over the right side of the scrotum of similar size also with purulent material and bleeding  LABORATORY PANEL:   CBC  Recent Labs Lab 08/31/15 0530  WBC 7.4  HGB 9.4*  HCT 28.8*  PLT 228   ------------------------------------------------------------------------------------------------------------------  Chemistries   Recent Labs Lab 08/31/15 0530  NA 131*  K 6.2*  CL 96*  CO2 25  GLUCOSE 244*  BUN 86*  CREATININE 3.21*  CALCIUM 8.2*  AST 27  ALT 12*  ALKPHOS 148*  BILITOT 0.9   ------------------------------------------------------------------------------------------------------------------  Cardiac Enzymes  Recent Labs Lab 08/30/15 1207  TROPONINI <0.03   ------------------------------------------------------------------------------------------------------------------  RADIOLOGY:  Dg Chest Portable 1 View  08/27/2015  CLINICAL DATA:  Shortness of Breath, weakness EXAM: PORTABLE CHEST 1 VIEW COMPARISON:  10/20/ 16 FINDINGS: Cardiomegaly again noted. Persistent mild congestion/ pulmonary edema. Again noted small left pleural effusion left basilar atelectasis or infiltrate. IMPRESSION: Persistent mild congestion/ pulmonary edema. Small left pleural effusion left basilar atelectasis or infiltrate. Electronically Signed   By: Natasha Mead M.D.   On: 09/15/2015 16:03     ASSESSMENT AND PLAN:   1. Sepsis likely due to cellulitis right lower extent and scrotal cellulitis: Hypothermic, hypotensive, hypoxic at the time of admission  Blood culture negative. Sensitivity of pseudomonas in wound cultures are pending. Continue vancomycin and Zosyn.  Continue  wound care .  Provide IV fluids  Appreciate IDs recommendations   2. Acute on chronic respiratory  failure: Has advanced  COPD and uses 2 L of oxygen via nasal cannula at home and with chronic diastolic heart failure and pulmonary edema on chest x-ray.  Currently off BiPAP  . Unable to obtain CT scan due to patient's girth.  Continue Solu-Medrol, DuoNeb nebulizer treatments and broad-spectrum antibiotics  pt and his family agreed for DNR today.  3. Acute on chronic diastolic heart failure exacerbation: Chest x-ray showing pulmonary edema. Patient has anasarca. Unfortunately due to renal failure unable to diurese. Have discussed with nephrology. Gentle hydration with bicarbonate drip.     4. Acute renal failure secondary to cardiorenal syndrome on CKD3: Baseline creatinine around 1.7 . Likely intravascularly depleted despite anasarca. Avoid nephrotoxins received IV Lasix renal function continued to get worse with hyperkalamia now.  After discussion with the pulmonologist and nephrologist family decided not to go for hemodialysis.  5. Hyperkalemia:  Potassium 6.1. He has received multiple nebulizers and insulin. No EKG changes.  Refused for hemodialysis today. On further discussion with wife she is leaning towards keeping him comfortable while continuing antibiotics but not doing any aggressive management and not doing CPR or intubation in any terminal events.  6. Anemia- stable.  7. Hyponatremia: Due to volume depletion.  8. Diabetes mellitus type 2: hemoglobin A1c 7.2. On sliding scale  9.  new onset Atrial fibrillation: Rate is controlled.  follow-up with cardiology regarding anticoagulation, NOAC     All the records are reviewed and case discussed with Care Management/Social Workerr. Management plans discussed with the patient, family and they are in agreement.  CODE STATUS: DNR  TOTAL TIME TAKING CARE OF THIS PATIENT: 35  minutes.   POSSIBLE D/C IN 2-3 DAYS, DEPENDING ON CLINICAL CONDITION.  I confirmed with patient's wife was present in the room about the the dissections for CPR and  intubation, she confirms DO NOT RESUSCITATE and no intubation. She would like to have him morphine and other pain medication to keep him comfortable. But she would like to continue giving him antibiotics to treat his infection.  As patient is hemodynamically stable currently , I will transfer him to a medical floor.  Altamese DillingVACHHANI, Galya Dunnigan M.D on 08/31/2015 at 2:53 PM  Between 7am to 6pm - Pager - [phone] After 6pm go to [email or web address] - password [email or web address] [facility]  [facility]  Office  [phone]  CC: Primary care physician; Marguarite ArbourSPARKS,JEFFREY D, MD

## 2015-08-31 NOTE — Progress Notes (Addendum)
[facility] Progess Note  ADDENDUM: Discussed care plan with pt with family (wife, son) at bedside. Explained the options of continued aggressive care and discussed the likelihood that HD by itself could keep him alive, but would be unlikely to benefit him in terms of quality of life, and would not help with his pain, debility, immobility or dependence on others. Offered option of comfort measures with the understanding that he would pass away. He expressed that this was preferable to him, and he asked to speak with the chaplain. Will change code status to DNR, and consult hospice.  Discussed plan with RN, and nephrology as well.    ASSESSMENT/PLAN    59 yo male with PMHx of LE cellulitis, recurrent, CKD 3, COPD on 2-3L O2, anasarca,dCHF, present with continued cellulitis of the b\l LE, respiratory acidosis, and AMS  PULMONARY Sepsis  - likely due to cellulitis and skin infection - cont with ABx - no overt respirator distress this morning - tolerated Bipap overnight - may continue while in the hospital - respiratory acidosis now resolving Acute on chronic respiratory failure:  - secondary to COPD/OHS/renal failure with volume overload. Wheezing appears to be better today, will decrease dose of steroids, which may be contributing to volume retention. - maintain O2 sats >88% - exacerbated by fluid overload inconjunction with dCHF - may benefit from further diuresis inaddition to Bipap  CARDIOVASCULAR Acute on chronic diastolic heart failure exacerbation - nephrology following CKD - Patient has anasarca.  - Continue BiPAP. Atrial fibrillation - Rate is controlled - no known history of atrial fibrillation- stable currently and rate controlled Hx of DVTs and PEs - not on anticoagulation  RENAL Acute renal failure with CKD3 - Baseline creatinine around 1.7, today >2. - most likely due to dec po intake and UOP - Nephrology following - appreciate recs;  increase lasix dose? Anemia: Likely due to anemia of chronic disease. Hemoglobin is stable   GASTROINTESTINAL SUP  HEMATOLOGIC Follow cbc  INFECTIOUS Cellulitis Chronic venous stasis -cont with antibiotics - follow up wound cultures - adjust antibiotics based on S/S from cx. We'll discontinue vancomycin.  --- Wound culture 12/8: Negative. Urine cultures 12/8: Negative thus far. Blood culture is 12/8: Negative thus far. MRSA screen 12/8: Negative.  Vancomycin 12/9 >> 12/10. Zosyn 12/9 >> ____  ENDOCRINE -ICU hypo/hyperglycemia protocol  NEUROLOGIC AMS RASS goal: 0 - neuro status improving, almost back at baseline Metabolic encephalopathy    ---------------------------------------   ----------------------------------------   Name: Tyler [name] MRN: [id number] DOB: 08/18/1956    ADMISSION DATE:  09/08/2015    CHIEF COMPLAINT:  dyspnea   SUBJECTIVE/ROS:   Pt currently on the bipap, can not provide history or review of systems.    VITAL SIGNS: Temp:  [97.7 F (36.5 C)-98.2 F (36.8 C)] 97.7 F (36.5 C) (12/10 0800) Pulse Rate:  [65-87] 65 (12/10 0800) Resp:  [11-20] 15 (12/10 0800) BP: (59-130)/(36-86) 116/71 mmHg (12/10 0800) SpO2:  [75 %-96 %] 96 % (12/10 0800) FiO2 (%):  [36 %] 36 % (12/09 1433) Weight:  [168.738 kg (372 lb)] 168.738 kg (372 lb) (12/10 0414) HEMODYNAMICS:   VENTILATOR SETTINGS: Vent Mode:  [-]  FiO2 (%):  [36 %] 36 % INTAKE / OUTPUT:  Intake/Output Summary (Last 24 hours) at 08/31/15 0810 Last data filed at 08/31/15 0602  Gross per 24 hour  Intake    940 ml  Output    800 ml  Net    140 ml  PHYSICAL EXAMINATION: Physical Examination:   VS: BP 116/71 mmHg  Pulse 65  Temp(Src) 97.7 F (36.5 C) (Axillary)  Resp 15  Ht 6' (1.829 m)  Wt 168.738 kg (372 lb)  BMI 50.44 kg/m2  SpO2 96%  General Appearance: No distress  Neuro:without focal findings, mental status normal. HEENT: PERRLA, EOM  intact. Pulmonary: normal breath sounds   CardiovascularNormal S1,S2.  No m/r/g.   Abdomen: Benign, Soft, non-tender. Renal:  No costovertebral tenderness  GU:  Not performed at this time. Endocrine: No evident thyromegaly. Skin:   warm, no rashes, no ecchymosis  Extremities: normal, no cyanosis, clubbing.   LABS:   LABORATORY PANEL:   CBC  Recent Labs Lab 08/31/15 0530  WBC 7.4  HGB 9.4*  HCT 28.8*  PLT 228    Chemistries   Recent Labs Lab 08/31/15 0530  NA 131*  K 6.2*  CL 96*  CO2 25  GLUCOSE 244*  BUN 86*  CREATININE 3.21*  CALCIUM 8.2*  AST 27  ALT 12*  ALKPHOS 148*  BILITOT 0.9     Recent Labs Lab Sep 25, 2015 2230 08/30/15 0730 08/30/15 1203 08/30/15 1617 08/30/15 2318 08/31/15 0809  GLUCAP 199* 226* 212* 221* 245* 226*    Recent Labs Lab 09/25/2015 1715  PHART 7.29*  PCO2ART 52*  PO2ART 58*    Recent Labs Lab 2015-09-25 1632 08/31/15 0530  AST 28 27  ALT 11* 12*  ALKPHOS 173* 148*  BILITOT 0.9 0.9  ALBUMIN 2.3* 2.2*    Cardiac Enzymes  Recent Labs Lab 08/30/15 1207  TROPONINI <0.03    RADIOLOGY:  Dg Chest Portable 1 View  September 25, 2015  CLINICAL DATA:  Shortness of Breath, weakness EXAM: PORTABLE CHEST 1 VIEW COMPARISON:  10/20/ 16 FINDINGS: Cardiomegaly again noted. Persistent mild congestion/ pulmonary edema. Again noted small left pleural effusion left basilar atelectasis or infiltrate. IMPRESSION: Persistent mild congestion/ pulmonary edema. Small left pleural effusion left basilar atelectasis or infiltrate. Electronically Signed   By: Natasha Mead M.D.   On: 09/25/15 16:03       --Wells Guiles, MD.  Pager [phone] [facility] Office Number: [phone]  Santiago Glad, M.D.  Stephanie Acre, M.D.  Billy Fischer, M.D  Critical Care Attestation.  I have personally obtained a history, examined the patient, evaluated laboratory and imaging results, formulated the assessment and plan and  placed orders. The Patient requires high complexity decision making for assessment and support, frequent evaluation and titration of therapies, application of advanced monitoring technologies and extensive interpretation of multiple databases. The patient has critical illness that could lead imminently to failure of 1 or more organ systems and requires the highest level of physician preparedness to intervene.  Critical Care Time devoted to patient care services described in this note is 35 minutes and is exclusive of time spent in procedures.

## 2015-08-31 NOTE — Progress Notes (Signed)
[facility]  ROUNDING NOTE   Subjective:   Potassium 6.2 (5.9) Creatinine 3.21 (2.86) (2.82) Patient's overall health continues to decline Potassium is high S Cr is worsening   Objective:  Vital signs in last 24 hours:  Temp:  [97.7 F (36.5 C)-98.2 F (36.8 C)] 97.7 F (36.5 C) (12/10 0800) Pulse Rate:  [65-87] 65 (12/10 0800) Resp:  [11-20] 15 (12/10 0800) BP: (59-130)/(36-83) 116/71 mmHg (12/10 0800) SpO2:  [75 %-96 %] 96 % (12/10 0800) FiO2 (%):  [36 %] 36 % (12/09 1433) Weight:  [168.738 kg (372 lb)] 168.738 kg (372 lb) (12/10 0414)  Weight change: -2.862 kg (-6 lb 5 oz) Filed Weights   08/24/2015 1525 08/30/15 0602 08/31/15 0414  Weight: 171.6 kg (378 lb 5 oz) 172.1 kg (379 lb 6.6 oz) 168.738 kg (372 lb)    Intake/Output: I/O last 3 completed shifts: In: 1740 [P.O.:140; I.V.:400; IV Piggyback:1200] Out: 1250 [Urine:1250]   Intake/Output this shift:     Physical Exam: General: Critically ill  Head: Moist oral mucosal membranes  Eyes: Eyes open,  Neck: Supple, trachea midline  Lungs:  Bilateral wheezes, [place]   Heart: Regular rate and rhythm  Abdomen:  Soft, nontender, obese, abdominal wall edema  Extremities: 3+ peripheral edema.  Neurologic: Alert and oriented  Skin: Erythema on lower extremities, right heel wound  GU Scrotal edema    Basic Metabolic Panel:  Recent Labs Lab 08/23/2015 1632 09/16/2015 2320 08/30/15 0444 08/30/15 1207 08/30/15 1852 08/31/15 0530  NA 129* 132* 130* 130*  --  131*  K 5.8* 5.9* 6.2* 6.1* 6.1* 6.2*  CL 95* 99* 95* 95*  --  96*  CO2 --  25  GLUCOSE 189* 194* 224* 218*  --  244*  BUN 77* 81* 79* 80*  --  86*  CREATININE 2.82* 2.86* 2.82* 3.05*  --  3.21*  CALCIUM 8.4* 8.3* 8.2* 8.2*  --  8.2*    Liver Function Tests:  Recent Labs Lab 08/28/2015 1632 08/31/15 0530  AST 28 27  ALT 11* 12*  ALKPHOS 173* 148*  BILITOT 0.9 0.9  PROT 7.2 6.7  ALBUMIN 2.3* 2.2*    Recent Labs Lab  09/16/2015 1632  LIPASE 18    Recent Labs Lab 09/17/2015 1609  AMMONIA 21    CBC:  Recent Labs Lab 08/31/2015 1632 08/30/15 0444 08/31/15 0530  WBC 7.1 6.6 7.4  NEUTROABS 5.8  --   --   HGB 9.9* 10.1* 9.4*  HCT 31.6* 30.8* 28.8*  MCV 83.8 83.3 83.2  PLT 248 217 228    Cardiac Enzymes:  Recent Labs Lab 09/15/2015 1632 08/24/2015 2328 08/30/15 0444 08/30/15 1207  TROPONINI <0.03 0.04* <0.03 <0.03    BNP: Invalid input(s): POCBNP  CBG:  Recent Labs Lab 08/30/15 0730 08/30/15 1203 08/30/15 1617 08/30/15 2318 08/31/15 0809  GLUCAP 226* 212* 221* 245* 226*    Microbiology: Results for orders placed or performed during the hospital encounter of 09/05/2015  Blood culture (routine x 2)     Status: None (Preliminary result)   Collection Time: 09/18/2015  4:09 PM  Result Value Ref Range Status   Specimen Description BLOOD RIGHT HAND  Final   Special Requests Normal  Final   Culture NO GROWTH 2 DAYS  Final   Report Status PENDING  Incomplete  Blood culture (routine x 2)     Status: None (Preliminary result)   Collection Time: 09/07/2015  4:32 PM  Result Value Ref Range  Status   Specimen Description BLOOD RIGHT HAND  Final   Special Requests BOTTLES DRAWN AEROBIC AND ANAEROBIC 7CC  Final   Culture NO GROWTH 2 DAYS  Final   Report Status PENDING  Incomplete  Urine culture     Status: None (Preliminary result)   Collection Time: 09/02/2015  4:45 PM  Result Value Ref Range Status   Specimen Description URINE, CATHETERIZED  Final   Special Requests NONE  Final   Culture NO GROWTH < 24 HOURS  Final   Report Status PENDING  Incomplete  Wound culture     Status: None (Preliminary result)   Collection Time: 09/12/2015 10:23 PM  Result Value Ref Range Status   Specimen Description SCROTUM  Final   Special Requests NONE  Final   Gram Stain RARE WBC SEEN NO ORGANISMS SEEN   Final   Culture   Final    LIGHT GROWTH PSEUDOMONAS AERUGINOSA SUSCEPTIBILITIES TO FOLLOW    Report  Status PENDING  Incomplete  MRSA PCR Screening     Status: None   Collection Time: 09/02/2015 10:23 PM  Result Value Ref Range Status   MRSA by PCR NEGATIVE NEGATIVE Final    Comment:        The GeneXpert MRSA Assay (FDA approved for NASAL specimens only), is one component of a comprehensive MRSA colonization surveillance program. It is not intended to diagnose MRSA infection nor to guide or monitor treatment for MRSA infections.     Coagulation Studies:  Recent Labs  08/24/2015 1632  LABPROT 14.8  INR 1.14    Urinalysis:  Recent Labs  09/10/2015 1645  COLORURINE YELLOW*  LABSPEC 1.013  PHURINE 5.0  GLUCOSEU NEGATIVE  HGBUR 1+*  BILIRUBINUR NEGATIVE  KETONESUR NEGATIVE  PROTEINUR 100*  NITRITE NEGATIVE  LEUKOCYTESUR NEGATIVE      Imaging: Dg Chest Portable 1 View  09/06/2015  CLINICAL DATA:  Shortness of Breath, weakness EXAM: PORTABLE CHEST 1 VIEW COMPARISON:  10/20/ 16 FINDINGS: Cardiomegaly again noted. Persistent mild congestion/ pulmonary edema. Again noted small left pleural effusion left basilar atelectasis or infiltrate. IMPRESSION: Persistent mild congestion/ pulmonary edema. Small left pleural effusion left basilar atelectasis or infiltrate. Electronically Signed   By: Natasha MeadLiviu  Pop M.D.   On: 09/15/2015 16:03     Medications:     . antiseptic oral rinse  7 mL Mouth Rinse q12n4p  . aspirin EC  81 mg Oral Daily  . chlorhexidine  15 mL Mouth Rinse BID  . heparin  5,000 Units Subcutaneous 3 times per day  . insulin aspart  0-15 Units Subcutaneous TID WC  . insulin aspart  0-5 Units Subcutaneous QHS  . ipratropium-albuterol  3 mL Nebulization Q6H  . levothyroxine  100 mcg Oral QAC breakfast  . methylPREDNISolone (SOLU-MEDROL) injection  40 mg Intravenous Q12H  . piperacillin-tazobactam (ZOSYN)  IV  4.5 g Intravenous 3 times per day  . sodium chloride  3 mL Intravenous Q12H  . sodium chloride  3 mL Intravenous Q12H  . tamsulosin  0.4 mg Oral Daily      Assessment/ Plan:  [name] is a 59 y.o. white  male with bilateral pulmonary emboli, DVTs, hypercoagulable disorder, varicose veins, morbid obesity,  chronic congestive heart failure, obstructive sleep apnea, diabetes type 2 with nephropathy retinopathy, neuropathy, atrial fibrillation, chronic lower back pain, admitted on 08/23/2015 for Anasarca [R60.1] Acute on chronic renal failure (HCC) [N17.9, N18.9] Hypothermia, initial encounter [T68.XXXA] Acute on chronic respiratory failure with hypoxia (HCC) [J96.21] Multiple  wounds of skin [R23.8] Sepsis, due to unspecified organism (HCC) [A41.9] Morbid obesity, unspecified obesity type (HCC) [E66.01]  1. Acute renal failure on chronic kidney disease stage III with hyperkalemia and metabolic acidosis: .  Patient's acute renal failure is likely due to acute cardiorenal syndrome.  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  -  Patient and family declined aggressive care this morning to the ICU physician when possibility of dialysis was presented -  Patient is not expected to do well on dialysis in the long run due to his various co morbidities and poor functional status - Agree with DNR and hospice evaluation  2. Acute Respiratory Failure: acute exacerbation of COPD, obstructive sleep apnea/Pickwickian disorder, and acute exacerbation of diastolic congestive heart failure. Patient is fluid overloaded on top of his chronic lymphedema and anasarca  - symptomatic management  3. Hyponatremia and generalized edema: due to volume overload and congestive heart failure.   4. Anemia of chronic kidney disease: hemoglobin 9.4. No epo in the past.   5. Diabetes mellitus type II with chronic kidney disease: Hemoglobin A1c 7.2% - continue glucose control.     LOS: 2 Ayaan Ringle 12/10/201610:16 AM

## 2015-08-31 NOTE — Progress Notes (Signed)
ANTIBIOTIC CONSULT NOTE - INITIAL  Pharmacy Consult for Zosyn  Indication: rule out sepsis  Allergies  Allergen Reactions  . Enalapril Cough  . Atorvastatin Palpitations  . Lovastatin Palpitations    Patient Measurements: Height: 6' (182.9 cm) Weight: (!) 372 lb (168.738 kg) IBW/kg (Calculated) : 77.6 Adjusted Body Weight: 115.2 kg   Vital Signs: Temp: 97 F (36.1 C) (12/10 1000) Temp Source: Rectal (12/10 0800) BP: 124/76 mmHg (12/10 1000) Pulse Rate: 78 (12/10 1000) Intake/Output from previous day: 12/09 0701 - 12/10 0700 In: 940 [P.O.:140; IV Piggyback:800] Out: 800 [Urine:800]   Recent Labs  09/02/2015 1632  08/30/15 0444 08/30/15 1207 08/31/15 0530  WBC 7.1  --  6.6  --  7.4  HGB 9.9*  --  10.1*  --  9.4*  PLT 248  --  217  --  228  CREATININE 2.82*  < > 2.82* 3.05* 3.21*  < > = values in this interval not displayed. Estimated Creatinine Clearance: 40 mL/min (by C-G formula based on Cr of 3.21).  Microbiology: Recent Results (from the past 720 hour(s))  Blood culture (routine x 2)     Status: None (Preliminary result)   Collection Time: 09/11/2015  4:09 PM  Result Value Ref Range Status   Specimen Description BLOOD RIGHT HAND  Final   Special Requests Normal  Final   Culture NO GROWTH 2 DAYS  Final   Report Status PENDING  Incomplete  Blood culture (routine x 2)     Status: None (Preliminary result)   Collection Time: 09/04/2015  4:32 PM  Result Value Ref Range Status   Specimen Description BLOOD RIGHT HAND  Final   Special Requests BOTTLES DRAWN AEROBIC AND ANAEROBIC 7CC  Final   Culture NO GROWTH 2 DAYS  Final   Report Status PENDING  Incomplete  Urine culture     Status: None   Collection Time: 09/19/2015  4:45 PM  Result Value Ref Range Status   Specimen Description URINE, CATHETERIZED  Final   Special Requests NONE  Final   Culture NO GROWTH 2 DAYS  Final   Report Status 08/31/2015 FINAL  Final  Wound culture     Status: None (Preliminary result)   Collection Time: 08/28/2015 10:23 PM  Result Value Ref Range Status   Specimen Description SCROTUM  Final   Special Requests NONE  Final   Gram Stain RARE WBC SEEN NO ORGANISMS SEEN   Final   Culture   Final    LIGHT GROWTH PSEUDOMONAS AERUGINOSA SUSCEPTIBILITIES TO FOLLOW    Report Status PENDING  Incomplete  MRSA PCR Screening     Status: None   Collection Time: 09/07/2015 10:23 PM  Result Value Ref Range Status   MRSA by PCR NEGATIVE NEGATIVE Final    Comment:        The GeneXpert MRSA Assay (FDA approved for NASAL specimens only), is one component of a comprehensive MRSA colonization surveillance program. It is not intended to diagnose MRSA infection nor to guide or monitor treatment for MRSA infections.     Medical History: Past Medical History  Diagnosis Date  . Cataract   . CHF (congestive heart failure) (HCC)   . COPD (chronic obstructive pulmonary disease) (HCC)   . Diabetes mellitus without complication (HCC)   . Sleep apnea   . Oxygen deficiency   . Thyroid disease   . Gout   . Hypothyroidism   . CKD (chronic kidney disease) stage 3, GFR 30-59 ml/min   . Asthma   .  Hypertension     Medications:  Prescriptions prior to admission  Medication Sig Dispense Refill Last Dose  . acetaminophen (TYLENOL) 500 MG tablet Take 1,000 mg by mouth every 6 (six) hours as needed for mild pain or headache.   PRN  . ammonium lactate (LAC-HYDRIN) 12 % lotion Apply topically as needed for dry skin. (Patient taking differently: Apply 1 application topically as needed for dry skin. ) 400 g 0 PRN  . aspirin EC 81 MG EC tablet Take 1 tablet (81 mg total) by mouth daily. 100 tablet 0 09/05/2015 at am  . cefUROXime (CEFTIN) 500 MG tablet Take 500 mg by mouth 2 (two) times daily with a meal. For 10 days   08/25/2015 at am  . colchicine 0.6 MG tablet Take 0.6 mg by mouth daily as needed (for gout flares).    PRN  . cyanocobalamin (,VITAMIN B-12,) 1000 MCG/ML injection Inject 1,000 mcg  into the muscle every 30 (thirty) days.   Past Month  . cyanocobalamin 2000 MCG tablet Take 2,000 mcg by mouth daily.   09/02/2015 at am  . docusate sodium (COLACE) 100 MG capsule Take 1 capsule (100 mg total) by mouth 2 (two) times daily. (Patient taking differently: Take 100 mg by mouth daily. ) 10 capsule 0 09/19/2015 at am  . fluticasone (FLONASE) 50 MCG/ACT nasal spray Place 2 sprays into both nostrils daily as needed for rhinitis.   PRN  . furosemide (LASIX) 40 MG tablet Take 1 tablet (40 mg total) by mouth 2 (two) times daily. 60 tablet 5 09/19/2015 at am  . gabapentin (NEURONTIN) 100 MG capsule Take 100 mg by mouth 2 (two) times daily.   08/22/2015 at am  . hydrOXYzine (ATARAX/VISTARIL) 25 MG tablet Take 25-50 mg by mouth 3 (three) times daily as needed for itching.   PRN  . insulin NPH Human (HUMULIN N,NOVOLIN N) 100 UNIT/ML injection Inject into the skin as needed (for high blood sugar). Pt states that he uses per sliding scale.   PRN  . insulin regular (NOVOLIN R,HUMULIN R) 100 units/mL injection Inject into the skin as needed for high blood sugar. Pt states that he uses per sliding scale.   PRN  . ipratropium-albuterol (DUONEB) 0.5-2.5 (3) MG/3ML SOLN Take 3 mLs by nebulization 2 (two) times daily.   09/02/2015 at am  . lactulose (CHRONULAC) 10 GM/15ML solution Take 20 g by mouth 2 (two) times daily as needed for mild constipation.    PRN  . levothyroxine (SYNTHROID, LEVOTHROID) 100 MCG tablet Take 100 mcg by mouth daily.   09/20/2015 at am  . ondansetron (ZOFRAN-ODT) 4 MG disintegrating tablet Take 4 mg by mouth every 8 (eight) hours as needed for nausea or vomiting.   PRN  . PARoxetine (PAXIL) 20 MG tablet Take 20 mg by mouth daily.   08/23/2015 at am  . polyethylene glycol (MIRALAX / GLYCOLAX) packet Take 17 g by mouth daily as needed for mild constipation.    PRN  . pravastatin (PRAVACHOL) 40 MG tablet Take 40 mg by mouth at bedtime.   08/28/2015 at pm  . salmeterol (SEREVENT) 50 MCG/DOSE  diskus inhaler Inhale 1 puff into the lungs 2 (two) times daily as needed (for shortness of breath).   PRN  . spironolactone (ALDACTONE) 25 MG tablet Take 25 mg by mouth 2 (two) times daily.   09/14/2015 at am  . tamsulosin (FLOMAX) 0.4 MG CAPS capsule Take 1 capsule (0.4 mg total) by mouth daily. 30 capsule 5 09/16/2015 at  am  . albuterol (PROVENTIL) (2.5 MG/3ML) 0.083% nebulizer solution Take 3 mLs (2.5 mg total) by nebulization every 2 (two) hours as needed for wheezing. 75 mL 12 Taking  . alum & mag hydroxide-simeth (MAALOX/MYLANTA) 200-200-20 MG/5ML suspension Take 30 mLs by mouth every 4 (four) hours as needed for indigestion or heartburn. 355 mL 0 Taking  . cefUROXime (CEFTIN) 250 MG tablet Take 1 tablet (250 mg total) by mouth 2 (two) times daily with a meal. (Patient not taking: Reported on 08/26/2015) 20 tablet 0 Not Taking  . ondansetron (ZOFRAN) 4 MG tablet Take 1 tablet (4 mg total) by mouth every 6 (six) hours as needed for nausea. (Patient not taking: Reported on 07/22/2015) 20 tablet 0 Not Taking  . torsemide (DEMADEX) 100 MG tablet Take 50 mg by mouth daily.   Taking   Assessment: Pharmacy consulted to dose  Zosyn in this 59 year old male admitted with sepsis. Patients wound cultures now growing Pseudomonas.  Plan: Continue Zosyn 4.5 gm IV Q8H EI ordered to start 12/09 @ 3:00. Pharmacy will continue to monitor patients cultures and labs and make adjustments as needed.   Cher NakaiSheema [place] Pelto, PharmD Pharmacy Resident  08/31/2015,10:47 AM

## 2015-08-31 NOTE — Progress Notes (Signed)
Pt transferred from the unit, no complaints of pain, family at bedside. O2 @ 5L per [place] sats 86%. Wounds addressed, foam on all wounds

## 2015-09-01 ENCOUNTER — Inpatient Hospital Stay: Payer: Commercial Managed Care - HMO

## 2015-09-01 LAB — GLUCOSE, CAPILLARY
GLUCOSE-CAPILLARY: 254 mg/dL — AB (ref 65–99)
Glucose-Capillary: 275 mg/dL — ABNORMAL HIGH (ref 65–99)

## 2015-09-01 MED ORDER — IPRATROPIUM-ALBUTEROL 0.5-2.5 (3) MG/3ML IN SOLN
3.0000 mL | Freq: Four times a day (QID) | RESPIRATORY_TRACT | Status: DC | PRN
Start: 2015-09-01 — End: 2015-09-01

## 2015-09-02 ENCOUNTER — Encounter: Payer: Self-pay | Admitting: *Deleted

## 2015-09-02 LAB — WOUND CULTURE

## 2015-09-03 LAB — CULTURE, BLOOD (ROUTINE X 2)
CULTURE: NO GROWTH
CULTURE: NO GROWTH
SPECIAL REQUESTS: NORMAL

## 2015-09-05 ENCOUNTER — Ambulatory Visit: Payer: Commercial Managed Care - HMO | Admitting: Occupational Therapy

## 2015-09-17 NOTE — Therapy (Signed)
Documentation entered in wrong patient's chart. Erroneous encounter.

## 2015-09-22 NOTE — Progress Notes (Signed)
1024- Wife requests change of 100% nonrebreather mask to 02 cannula for comfort since pt keeps pulling at mask. 02 cannula applied with sats low. Pt updated on pulse ox level and goal is pt comfort which she agrees with.Will continue morphine prn for dysnea.

## 2015-09-22 NOTE — Discharge Summary (Signed)
This is death summary for the patient.  Date of death- 09/05/2015.  Cause of death- acute renal failure, acute diastolic heart failure, COPD exacerbation, sepsis due to cellulitis  History of presentation:   Tyler [name] is a 60 y.o. male with a known history of COPD on 2.5 L, NYHA class III chronic diastolic heart failure, pulmonary hypertension, obstructive sleep apnea intolerant of CPAP, history of DVTs and PEs, CK D3 and recent nonhealing wounds of the lower extremities presents today from home when the wound care nurse noted him to be more confused and weak than usual. Per his wife he fell twice on Monday. At home he is minimally active, sits in a chair all day, for the past 2 days he has been unable to stand. For the past 2 weeks he has been developing worsening wounds on his bottom and scrotum. He has been confused. He has been short of breath requiring his oxygen to be increased to 5 L to maintain sats over 90%. He has gained at least 10 pounds. On presentation to the emergency room he is hypothermic, hypoxic, in renal and respiratory failure.  Hospital course    Patient had gradual worsening in his condition with worsening renal function, and remained requiring oxygen.  He was kept on broad-spectrum antibiotic for the cellulitis and steroids and nebulizers for COPD.  He gradually developed hyperkalemia, and advised to go on hemodialysis per nephrology.  Though he had multiple medical issues and worsening conditions and very poor prognosis, family decided to make him DO NOT RESUSCITATE and try to keep him comfortable. So he was transferred to medical floor with his goals of treatments. And he died on 1611 th of December.

## 2015-09-22 NOTE — Progress Notes (Signed)
1247 Pt expired peacefully with family at bedside with chaplain support. Morphine given for dysnea with benefit noted and reported with family agreeable to all meds.

## 2015-09-22 NOTE — Progress Notes (Signed)
Family have left with acceptance verbalized and at peace with pt's death. Postmortem care completed. Ready to transport to morgue.

## 2015-09-22 NOTE — Progress Notes (Addendum)
   09/13/2015 1225  Clinical Encounter Type  Visited With Patient and family together  Visit Type Spiritual support;Patient actively dying  Referral From Nurse  Consult/Referral To Chaplain  Spiritual Encounters  Spiritual Needs Emotional  Stress Factors  Family Stress Factors Loss  Paged to PT and family. Provided emotional/spiritual/grief support and prayer. Konrad PentaChap Rick Gerri Acre [phone]

## 2015-09-22 NOTE — Plan of Care (Signed)
Problem: Education: Goal: Knowledge of  General Education information/materials will improve Outcome: Progressing Pt is alert, disoriented to time. Awareness is decreasing. Swelling in abdomen and groin area has increased. Foley remains in place, decreased output.  Pt now on nonrebreather at 11 L oxygen, oxygen sats in the low 90's, high 80s. Wife at bedside, supportive. C/o pain in bilateral hips, Percocet given x1.

## 2015-09-22 DEATH — deceased

## 2017-02-05 IMAGING — CT CT CHEST W/O CM
1 series · 15 of 33 positions shown, 19 images · non-contrast
Comparison: None

CLINICAL DATA: Chronic respiratory failure

EXAM:
CT CHEST WITHOUT CONTRAST
TECHNIQUE: Multidetector CT imaging of the chest was performed following the
standard protocol without IV contrast.

[Series 2: routine chest wo · axial · 0.92mm/px · z∈[+674,+949]mm · 15 of 65 slices shown, 19 images]
[im 5/65  mediastinal]
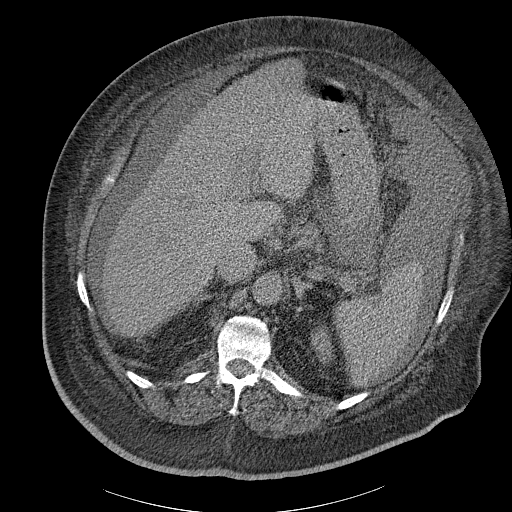
[im 5/65  lung]
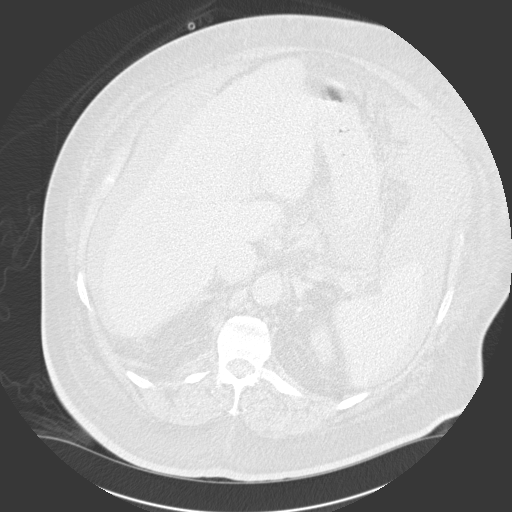
[im 10/65  lung]
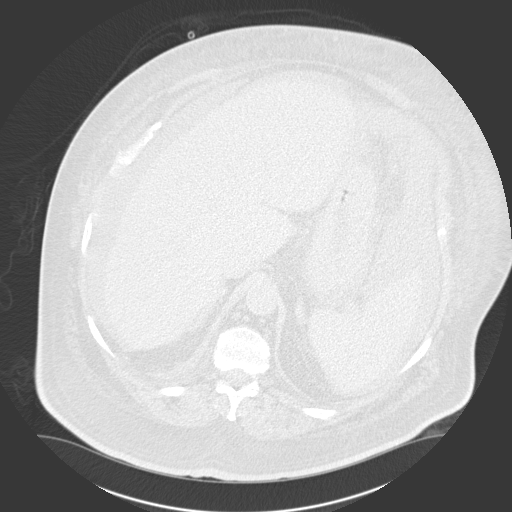
[im 13/65  lung]
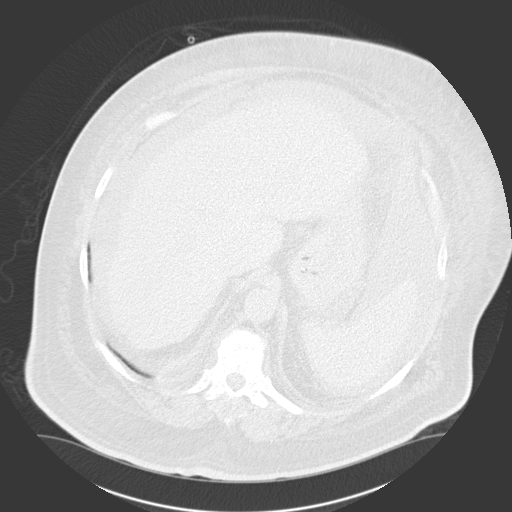
[im 17/65  lung]
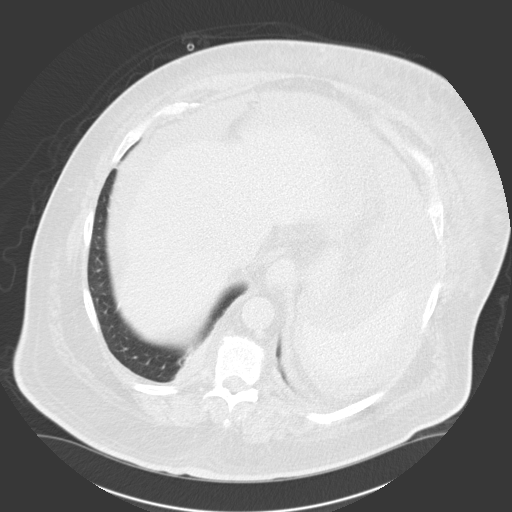
[im 22/65  mediastinal]
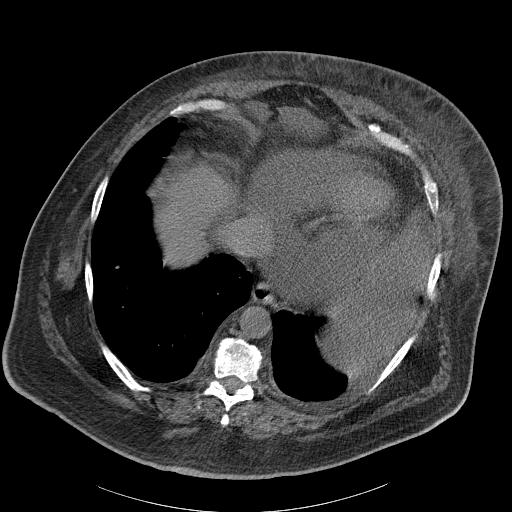
[im 22/65  lung]
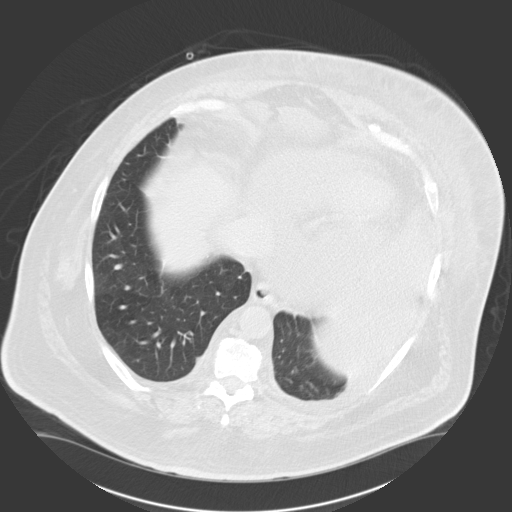
[im 26/65  lung]
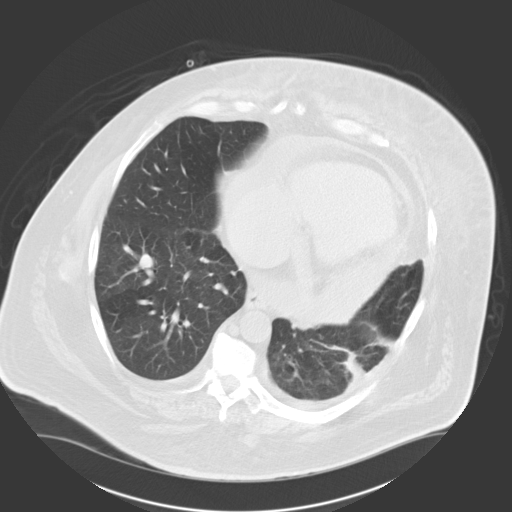
[im 29/65  lung]
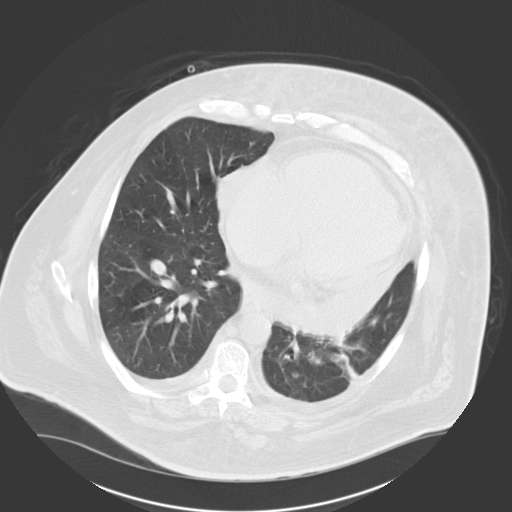
[im 34/65  lung]
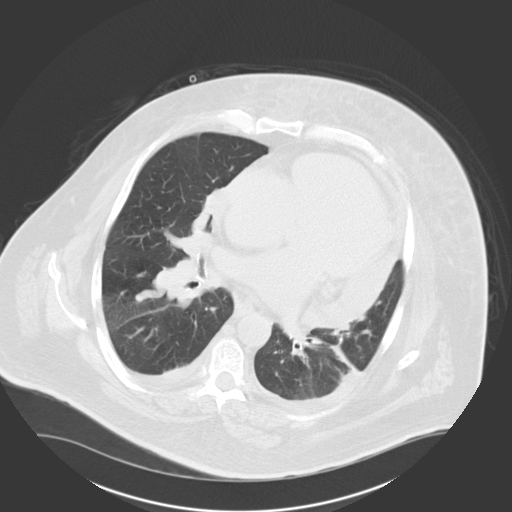
[im 36/65  mediastinal]
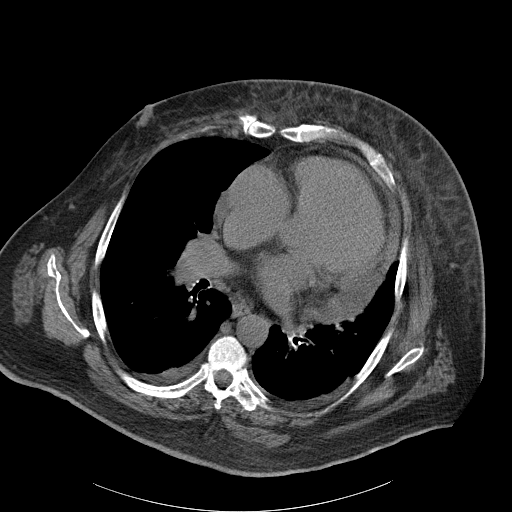
[im 36/65  lung]
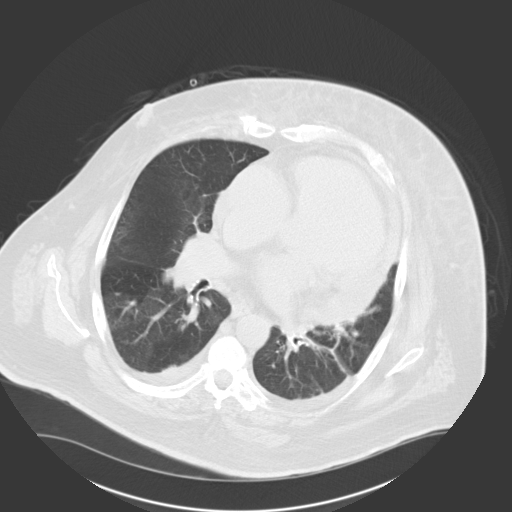
[im 39/65  lung]
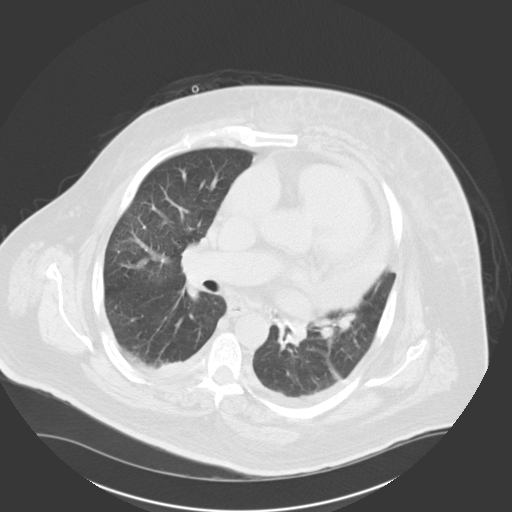
[im 43/65  lung]
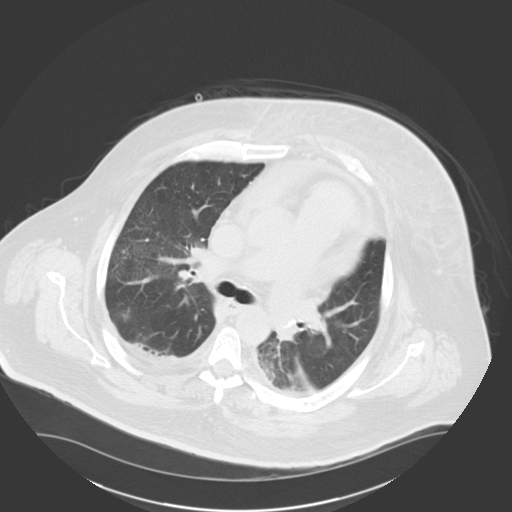
[im 48/65  lung]
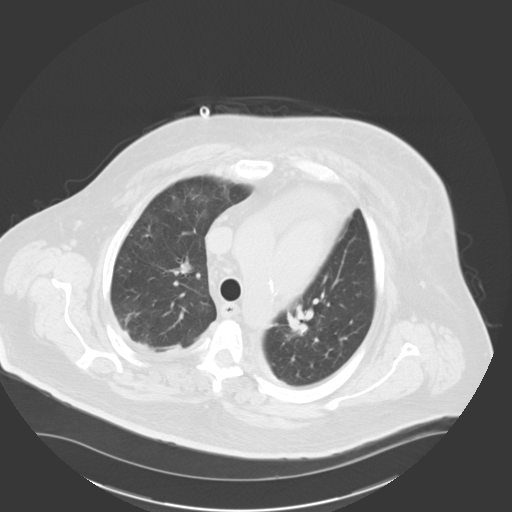
[im 52/65  mediastinal]
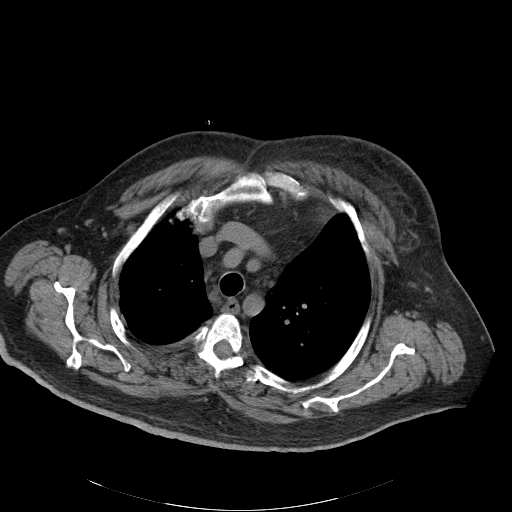
[im 52/65  lung]
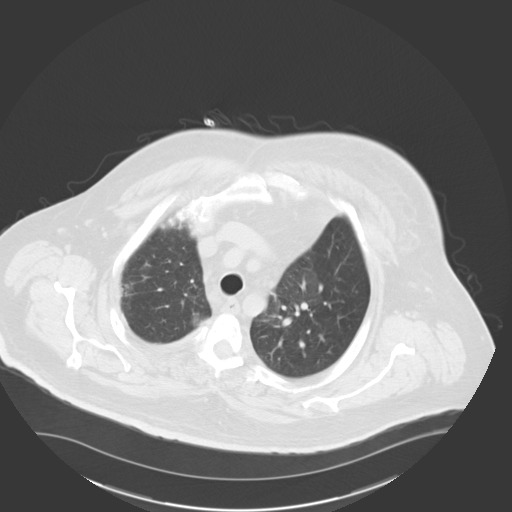
[im 55/65  lung]
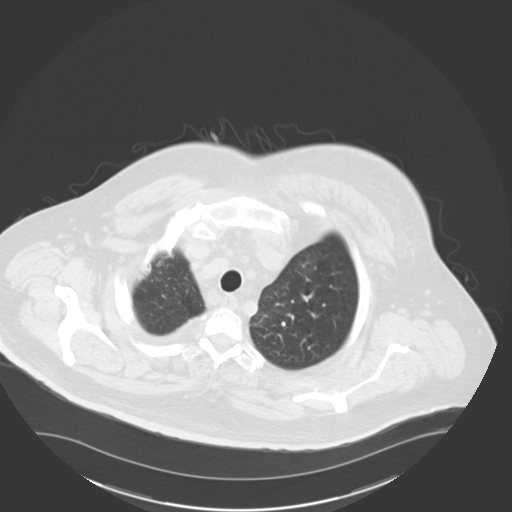
[im 60/65  lung]
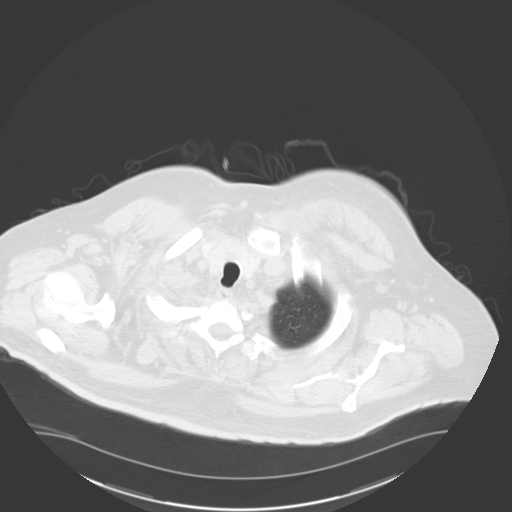

[15 of 33 positions shown; findings below may reference images not displayed]

FINDINGS: Mediastinum: The heart size is mildly enlarged. There is a small to
moderate pericardial effusion noted. This is similar to 05/30/2014.
Aortic atherosclerosis identified. The trachea appears patent and is
midline. Normal appearance of the esophagus. Similar appearance of
prominent mediastinal lymph nodes. The largest is in the high right
paratracheal region measuring 11 mm, image 11/series 2.

Lungs/Pleura: There are small bilateral pleural effusions.
Atelectasis is noted in the left lung day stress set atelectasis is
identified within the superior segment of both lower lobes. No lobar
consolidation. Chronic mosaic attenuation pattern is identified in
both lungs, with patchy areas of ground-glass attenuation in both
lungs. The appearance is similar to previous exam. There is no
airspace consolidation identified.

Upper Abdomen: No focal liver or splenic abnormality. There is
moderate ascites within the upper abdomen. The adrenal glands are
unremarkable.

Musculoskeletal: Mild multi level degenerative disc disease
identified within the thoracic spine.
IMPRESSION: 1. Mild cardiac enlargement and pericardial effusion is again noted
and appears similar to previous exam
2. Aortic atherosclerosis
3. Patchy areas of ground-glass attenuation are again identified in
both lungs. This is a finding which is been present dating back to
8908 is likely related to underlying interstitial lung disease such
as NSIP.
4. Small bilateral pleural effusions and  left base scarring.

## 2019-02-06 ENCOUNTER — Other Ambulatory Visit: Payer: Self-pay

## 2020-06-21 DEATH — deceased
